# Patient Record
Sex: Female | Born: 1935 | Race: White | Hispanic: No | Smoking: Former smoker
Health system: Southern US, Community
[De-identification: ages and names within clinical notes are randomized; demographics above are authoritative.]

## PROBLEM LIST (undated history)

## (undated) DIAGNOSIS — I2510 Atherosclerotic heart disease of native coronary artery without angina pectoris: Secondary | ICD-10-CM

## (undated) DIAGNOSIS — M1990 Unspecified osteoarthritis, unspecified site: Secondary | ICD-10-CM

## (undated) DIAGNOSIS — Z8701 Personal history of pneumonia (recurrent): Secondary | ICD-10-CM

## (undated) DIAGNOSIS — T8859XA Other complications of anesthesia, initial encounter: Secondary | ICD-10-CM

## (undated) DIAGNOSIS — J449 Chronic obstructive pulmonary disease, unspecified: Secondary | ICD-10-CM

## (undated) DIAGNOSIS — Q211 Atrial septal defect: Secondary | ICD-10-CM

## (undated) DIAGNOSIS — Z8673 Personal history of transient ischemic attack (TIA), and cerebral infarction without residual deficits: Secondary | ICD-10-CM

## (undated) DIAGNOSIS — Z9289 Personal history of other medical treatment: Secondary | ICD-10-CM

## (undated) DIAGNOSIS — R112 Nausea with vomiting, unspecified: Secondary | ICD-10-CM

## (undated) DIAGNOSIS — T4145XA Adverse effect of unspecified anesthetic, initial encounter: Secondary | ICD-10-CM

## (undated) DIAGNOSIS — I82402 Acute embolism and thrombosis of unspecified deep veins of left lower extremity: Secondary | ICD-10-CM

## (undated) DIAGNOSIS — F32A Depression, unspecified: Secondary | ICD-10-CM

## (undated) DIAGNOSIS — I10 Essential (primary) hypertension: Secondary | ICD-10-CM

## (undated) DIAGNOSIS — Z9889 Other specified postprocedural states: Secondary | ICD-10-CM

## (undated) DIAGNOSIS — R609 Edema, unspecified: Secondary | ICD-10-CM

## (undated) DIAGNOSIS — F329 Major depressive disorder, single episode, unspecified: Secondary | ICD-10-CM

## (undated) DIAGNOSIS — M7121 Synovial cyst of popliteal space [Baker], right knee: Secondary | ICD-10-CM

## (undated) DIAGNOSIS — R4701 Aphasia: Secondary | ICD-10-CM

## (undated) DIAGNOSIS — Q2112 Patent foramen ovale: Secondary | ICD-10-CM

## (undated) HISTORY — PX: CORONARY ANGIOPLASTY: SHX604

## (undated) HISTORY — DX: Atrial septal defect: Q21.1

## (undated) HISTORY — DX: Personal history of other medical treatment: Z92.89

## (undated) HISTORY — DX: Synovial cyst of popliteal space (Baker), right knee: M71.21

## (undated) HISTORY — DX: Patent foramen ovale: Q21.12

## (undated) HISTORY — DX: Essential (primary) hypertension: I10

## (undated) HISTORY — DX: Personal history of pneumonia (recurrent): Z87.01

## (undated) HISTORY — DX: Acute embolism and thrombosis of unspecified deep veins of left lower extremity: I82.402

## (undated) HISTORY — DX: Personal history of transient ischemic attack (TIA), and cerebral infarction without residual deficits: Z86.73

---

## 2001-01-17 ENCOUNTER — Other Ambulatory Visit: Admission: RE | Admit: 2001-01-17 | Discharge: 2001-01-17 | Payer: Self-pay | Admitting: Family Medicine

## 2004-11-30 ENCOUNTER — Ambulatory Visit: Payer: Self-pay

## 2005-03-08 ENCOUNTER — Ambulatory Visit: Payer: Self-pay | Admitting: Family Medicine

## 2006-03-14 ENCOUNTER — Ambulatory Visit: Payer: Self-pay | Admitting: Family Medicine

## 2007-03-20 ENCOUNTER — Ambulatory Visit: Payer: Self-pay | Admitting: Family Medicine

## 2007-05-02 ENCOUNTER — Ambulatory Visit: Payer: Self-pay | Admitting: Family Medicine

## 2008-02-20 ENCOUNTER — Ambulatory Visit: Payer: Self-pay | Admitting: Family Medicine

## 2008-03-20 ENCOUNTER — Ambulatory Visit: Payer: Self-pay | Admitting: Family Medicine

## 2009-04-22 ENCOUNTER — Ambulatory Visit: Payer: Self-pay | Admitting: Family Medicine

## 2009-05-17 HISTORY — PX: BALLOON ANGIOPLASTY, ARTERY: SHX564

## 2010-05-13 ENCOUNTER — Ambulatory Visit: Payer: Self-pay | Admitting: Family Medicine

## 2011-03-28 ENCOUNTER — Ambulatory Visit: Payer: Self-pay | Admitting: Family Medicine

## 2011-06-16 ENCOUNTER — Ambulatory Visit: Payer: Self-pay | Admitting: Family Medicine

## 2011-08-09 DIAGNOSIS — I2510 Atherosclerotic heart disease of native coronary artery without angina pectoris: Secondary | ICD-10-CM

## 2011-08-09 HISTORY — DX: Atherosclerotic heart disease of native coronary artery without angina pectoris: I25.10

## 2012-07-03 ENCOUNTER — Ambulatory Visit: Payer: Self-pay | Admitting: Neurology

## 2012-08-02 ENCOUNTER — Ambulatory Visit: Payer: Self-pay | Admitting: Family Medicine

## 2013-11-14 DIAGNOSIS — K922 Gastrointestinal hemorrhage, unspecified: Secondary | ICD-10-CM | POA: Insufficient documentation

## 2013-11-14 DIAGNOSIS — K279 Peptic ulcer, site unspecified, unspecified as acute or chronic, without hemorrhage or perforation: Secondary | ICD-10-CM | POA: Insufficient documentation

## 2013-11-14 DIAGNOSIS — Z9109 Other allergy status, other than to drugs and biological substances: Secondary | ICD-10-CM | POA: Insufficient documentation

## 2013-11-14 HISTORY — DX: Gastrointestinal hemorrhage, unspecified: K92.2

## 2014-01-20 ENCOUNTER — Ambulatory Visit: Payer: Self-pay | Admitting: Family Medicine

## 2014-03-20 DIAGNOSIS — R413 Other amnesia: Secondary | ICD-10-CM | POA: Insufficient documentation

## 2014-03-20 DIAGNOSIS — G479 Sleep disorder, unspecified: Secondary | ICD-10-CM | POA: Insufficient documentation

## 2015-01-14 ENCOUNTER — Other Ambulatory Visit: Payer: Self-pay | Admitting: Family Medicine

## 2015-01-14 NOTE — Telephone Encounter (Signed)
Pt contacted office for refill request on the following medications:  Clonazepam .5 mg  Symbicort 160-4.5  Paroxetine HCI 10 mg Ventolin HFA 108 Pt stated she needs these sent to [facility] order and that she hasn't gotten an order from [facility] since January. I advised that she still has refills on some of the medications at [facility] but since she hasn't been getting the medications she would like [place] to send in new RX or contact them. The refills for Symbicort were sent to [facility] but she would like to get that from [facility] instead. Thanks TNP

## 2015-01-16 NOTE — Telephone Encounter (Signed)
Advise patient Stephanie [name] has refills available for Paroxetine, Ventolin and Symbicort waiting for her call to request shipment to her home. The Clonazepam was refilled on 11-17-14 for 90 days and she should still have a month supply at home. This is a scheduled drug that will need to be written so she can pick up the prescription for refill (not to be electronically prescribed). Can get it ready for pick up so she can mail it to the pharmacy around the end of June.

## 2015-01-19 NOTE — Telephone Encounter (Signed)
Patient advised as directed below. Patient verbalized understanding.  

## 2015-02-10 ENCOUNTER — Telehealth: Payer: Self-pay | Admitting: Family Medicine

## 2015-02-10 ENCOUNTER — Other Ambulatory Visit: Payer: Self-pay | Admitting: Family Medicine

## 2015-02-10 DIAGNOSIS — F418 Other specified anxiety disorders: Secondary | ICD-10-CM

## 2015-02-10 NOTE — Telephone Encounter (Signed)
Pt would like refill of clonazepam 5 mg sent to her mail order pharmacy (pt thinks it is [facility])

## 2015-02-10 NOTE — Telephone Encounter (Signed)
Pt would like for clozapine 5 mg sent into her mail order pharmacy (pt thinks it is [facility])

## 2015-02-12 NOTE — Telephone Encounter (Signed)
Patient states her insurance changed mail order pharmacies. Patient states she uses [facility] [employer].

## 2015-02-12 NOTE — Telephone Encounter (Signed)
Ask patient to check with insurance about the proper mailorder pharmacy because the last refill we sent in was to [facility] [employer].

## 2015-02-13 MED ORDER — CLONAZEPAM 0.5 MG PO TABS: 0.5000 mg | ORAL_TABLET | Freq: Two times a day (BID) | ORAL | Status: DC | PRN

## 2015-02-13 NOTE — Telephone Encounter (Signed)
Advise patient the Epic system will not send this medication electronically - must be printed out and patient mail it in.

## 2015-02-13 NOTE — Telephone Encounter (Signed)
Patient advised as directed below. Patient states she will pick up RX today.

## 2015-03-04 DIAGNOSIS — R0681 Apnea, not elsewhere classified: Secondary | ICD-10-CM | POA: Insufficient documentation

## 2015-03-04 DIAGNOSIS — E785 Hyperlipidemia, unspecified: Secondary | ICD-10-CM | POA: Insufficient documentation

## 2015-03-04 DIAGNOSIS — M8580 Other specified disorders of bone density and structure, unspecified site: Secondary | ICD-10-CM | POA: Insufficient documentation

## 2015-03-04 DIAGNOSIS — J449 Chronic obstructive pulmonary disease, unspecified: Secondary | ICD-10-CM | POA: Insufficient documentation

## 2015-03-04 DIAGNOSIS — K279 Peptic ulcer, site unspecified, unspecified as acute or chronic, without hemorrhage or perforation: Secondary | ICD-10-CM | POA: Insufficient documentation

## 2015-03-04 DIAGNOSIS — I2510 Atherosclerotic heart disease of native coronary artery without angina pectoris: Secondary | ICD-10-CM | POA: Insufficient documentation

## 2015-03-04 DIAGNOSIS — F419 Anxiety disorder, unspecified: Secondary | ICD-10-CM

## 2015-03-04 DIAGNOSIS — F329 Major depressive disorder, single episode, unspecified: Secondary | ICD-10-CM | POA: Insufficient documentation

## 2015-03-05 ENCOUNTER — Ambulatory Visit (INDEPENDENT_AMBULATORY_CARE_PROVIDER_SITE_OTHER): Payer: Commercial Managed Care - HMO | Admitting: Family Medicine

## 2015-03-05 ENCOUNTER — Encounter: Payer: Self-pay | Admitting: Family Medicine

## 2015-03-05 VITALS — BP 126/62 | HR 68 | Temp 97.7°F | Resp 16 | Wt 129.4 lb

## 2015-03-05 DIAGNOSIS — F418 Other specified anxiety disorders: Secondary | ICD-10-CM

## 2015-03-05 DIAGNOSIS — L299 Pruritus, unspecified: Secondary | ICD-10-CM

## 2015-03-05 DIAGNOSIS — J449 Chronic obstructive pulmonary disease, unspecified: Secondary | ICD-10-CM | POA: Diagnosis not present

## 2015-03-05 DIAGNOSIS — F329 Major depressive disorder, single episode, unspecified: Secondary | ICD-10-CM

## 2015-03-05 DIAGNOSIS — F419 Anxiety disorder, unspecified: Secondary | ICD-10-CM

## 2015-03-05 NOTE — Progress Notes (Signed)
Patient ID: Stephanie [name], female   DOB: Nov 08, 1935, 79 y.o.   MRN: [id number]   Patient: Stephanie [name] Female    DOB: 22-Aug-1935   79 y.o.   MRN: [id number] Visit Date: 03/05/2015  Today's Provider: Vernie Murders, PA   Chief Complaint  Patient presents with  . Pruritis    episodic X 2 months   Subjective:    HPI  This 79 year old female developed itching in different areas with recent increase in stress with family and neighbor. Having spontaneous crying spells, anxiety and feeling bloated. COPD worse with dyspnea since the weather has been very hot. Still using Albuterol and Symbicort. Had a bad flare with chasing a roach around her house. Thinks itching is due to stress and anxiety.      Patient Active Problem List   Diagnosis Date Noted  . Anxiety and depression 03/04/2015  . Arteriosclerosis of coronary artery 03/04/2015  . CAFL (chronic airflow limitation) 03/04/2015  . Breathlessness on exertion 03/04/2015  . HLD (hyperlipidemia) 03/04/2015  . Osteopenia 03/04/2015  . Peptic ulcer 03/04/2015  . Amnesia 03/20/2014  . Disordered sleep 03/20/2014  . Allergy to environmental factors 11/14/2013  . Gastroduodenal ulcer 11/14/2013   History  Substance Use Topics  . Smoking status: Former Research scientist (life sciences)  . Smokeless tobacco: Not on file     Comment: QUIT IN 2002  . Alcohol Use: No   Family History  Problem Relation Age of Onset  . Hypertension Mother   . Diabetes Mother   . Colon cancer Mother    Past Surgical History  Procedure Laterality Date  . Balloon angioplasty, artery  05/17/2009   Allergies  Allergen Reactions  . Duloxetine Hcl   . Influenza Vaccines   . Venlafaxine     GI distress    Previous Medications   ACETAMINOPHEN (TYLENOL) 500 MG TABLET    Take 1 tablet by mouth daily.   ALBUTEROL (VENTOLIN HFA) 108 (90 BASE) MCG/ACT INHALER    Inhale 2 puffs into the lungs every 6 (six) hours as needed.   ASPIRIN 325 MG EC TABLET    Take 1 tablet by  mouth daily.   BUDESONIDE-FORMOTEROL (SYMBICORT) 160-4.5 MCG/ACT INHALER    Inhale 2 puffs into the lungs 2 (two) times daily.   CHOLECALCIFEROL 1000 UNITS CAPSULE    Take 1 capsule by mouth daily.   CLONAZEPAM (KLONOPIN) 0.5 MG TABLET    Take 1 tablet (0.5 mg total) by mouth 2 (two) times daily as needed for anxiety.   IBUPROFEN (ADVIL,MOTRIN) 200 MG TABLET    Take 2 tablets by mouth daily.   PAROXETINE (PAXIL) 10 MG TABLET    Take 0.5-1 tablets by mouth daily.   PRAVASTATIN (PRAVACHOL) 20 MG TABLET    Take 1 tablet by mouth daily.   TIOTROPIUM (SPIRIVA HANDIHALER) 18 MCG INHALATION CAPSULE    Place 1 capsule into inhaler and inhale daily.   Review of Systems  Constitutional: Negative.   HENT: Negative.   Respiratory: Positive for shortness of breath.        Worse with exertion.  Cardiovascular: Negative.   Skin:       Itching without rash today  Psychiatric/Behavioral: Positive for dysphoric mood. The patient is nervous/anxious.   All other systems reviewed and are negative.    Objective:   BP 126/62 mmHg  Pulse 68  Temp(Src) 97.7 F (36.5 C) (Oral)  Resp 16  Wt 129 lb 6.4 oz (58.695 kg)  Physical Exam  Constitutional:  She is oriented to person, place, and time. She appears well-developed and well-nourished. No distress.  HENT:  Head: Normocephalic and atraumatic.  Right Ear: Hearing normal.  Left Ear: Hearing normal.  Nose: Nose normal.  Eyes: Conjunctivae and lids are normal. Right eye exhibits no discharge. Left eye exhibits no discharge. No scleral icterus.  Cardiovascular: Normal rate and regular rhythm.   Pulmonary/Chest: Effort normal. No respiratory distress.  Distant breath sounds.  Abdominal: Soft. Bowel sounds are normal.  Musculoskeletal: Normal range of motion.  Neurological: She is alert and oriented to person, place, and time.  Skin: Skin is warm, dry and intact. No lesion and no rash noted.  Psychiatric: Her speech is normal and behavior is normal.  Thought content normal. Her mood appears anxious. She exhibits a depressed mood.      Assessment & Plan:     1. Itching Onset over the past 2 months without rash. 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 May use Claritin or Zyrtec prn and proceed with usual dosages of Clonazepam and Paroxetine. Recheck if no better in a month.   2. Anxiety and depression Flare with increase in family and neighbor stress. Continue present medications and follow up as needed. May need referral to psychiatrist if not returning to baseline.  3. COPD, moderate Stable breathing on the Albuterol inhaler, Symbicort and Spiriva. Dyspnea still a problem with physical exertion. Continue presetn regimen and follow up for CPE in the fall.

## 2015-04-17 ENCOUNTER — Ambulatory Visit (INDEPENDENT_AMBULATORY_CARE_PROVIDER_SITE_OTHER): Payer: Commercial Managed Care - HMO | Admitting: Family Medicine

## 2015-04-17 ENCOUNTER — Encounter: Payer: Self-pay | Admitting: Family Medicine

## 2015-04-17 VITALS — BP 142/74 | HR 89 | Temp 97.5°F | Resp 16 | Wt 128.4 lb

## 2015-04-17 DIAGNOSIS — F418 Other specified anxiety disorders: Secondary | ICD-10-CM

## 2015-04-17 DIAGNOSIS — L509 Urticaria, unspecified: Secondary | ICD-10-CM

## 2015-04-17 DIAGNOSIS — F329 Major depressive disorder, single episode, unspecified: Secondary | ICD-10-CM

## 2015-04-17 DIAGNOSIS — F419 Anxiety disorder, unspecified: Secondary | ICD-10-CM

## 2015-04-17 NOTE — Progress Notes (Signed)
Patient ID: Stephanie [name], female   DOB: 09-Jun-1936, 79 y.o.   MRN: [id number]   Patient: Stephanie [name] Female    DOB: 11-06-1935   79 y.o.   MRN: [id number] Visit Date: 04/17/2015  Today's Provider: Vernie Murders, PA   Chief Complaint  Patient presents with  . Rash    X 2 weeks   Subjective:    Rash This is a new problem. The current episode started 1 to 4 weeks ago. The problem has been gradually worsening since onset. The affected locations include the chest, right arm and left arm. The rash is characterized by itchiness and redness. She was exposed to nothing. Past treatments include nothing.  Gold Bond cream and Neutrogena Soap some help but no real help from Claritin. Changed detergents to unscented to try to stop itching. No new foods or changes in medications. Patient Active Problem List   Diagnosis Date Noted  . COPD, moderate 03/05/2015  . Anxiety and depression 03/04/2015  . Arteriosclerosis of coronary artery 03/04/2015  . CAFL (chronic airflow limitation) 03/04/2015  . Breathlessness on exertion 03/04/2015  . HLD (hyperlipidemia) 03/04/2015  . Osteopenia 03/04/2015  . Peptic ulcer 03/04/2015  . Amnesia 03/20/2014  . Disordered sleep 03/20/2014  . Allergy to environmental factors 11/14/2013  . Gastroduodenal ulcer 11/14/2013   Allergies  Allergen Reactions  . Duloxetine Hcl   . Influenza Vaccines   . Venlafaxine     GI distress     Previous Medications   ACETAMINOPHEN (TYLENOL) 500 MG TABLET    Take 1 tablet by mouth daily.   ALBUTEROL (VENTOLIN HFA) 108 (90 BASE) MCG/ACT INHALER    Inhale 2 puffs into the lungs every 6 (six) hours as needed.   ASPIRIN 325 MG EC TABLET    Take 1 tablet by mouth daily.   BUDESONIDE-FORMOTEROL (SYMBICORT) 160-4.5 MCG/ACT INHALER    Inhale 2 puffs into the lungs 2 (two) times daily.   CHOLECALCIFEROL 1000 UNITS CAPSULE    Take 1 capsule by mouth daily.   CLONAZEPAM (KLONOPIN) 0.5 MG TABLET    Take 1 tablet (0.5 mg  total) by mouth 2 (two) times daily as needed for anxiety.   IBUPROFEN (ADVIL,MOTRIN) 200 MG TABLET    Take 2 tablets by mouth daily.   LORATADINE 10 MG CAPS    Take by mouth.   PAROXETINE (PAXIL) 10 MG TABLET    Take 0.5-1 tablets by mouth daily.   PRAVASTATIN (PRAVACHOL) 20 MG TABLET    Take 1 tablet by mouth daily.   TIOTROPIUM (SPIRIVA HANDIHALER) 18 MCG INHALATION CAPSULE    Place 1 capsule into inhaler and inhale daily.    Review of Systems  Skin: Positive for rash.    Social History  Substance Use Topics  . Smoking status: Former Research scientist (life sciences)  . Smokeless tobacco: Not on file     Comment: QUIT IN 2002  . Alcohol Use: No   Objective:   BP 142/74 mmHg  Pulse 89  Temp(Src) 97.5 F (36.4 C) (Oral)  Resp 16  Wt 128 lb 6.4 oz (58.242 kg)  SpO2 96%  Physical Exam  Constitutional: She is oriented to person, place, and time. She appears well-developed and well-nourished.  Cardiovascular: Normal rate and regular rhythm.   Pulmonary/Chest: She has no wheezes. She has no rales.  Distant breath sounds unchanged.  Neurological: She is alert and oriented to person, place, and time.  Skin: No rash noted.  Intermittent pink whelps upper inner arms and across  chest. No pain or vesicles.  Psychiatric: Her behavior is normal. Thought content normal. Her mood appears anxious.      Assessment & Plan:     1. Urticaria Persistent recurrent itching whelps. No changes to breathing. Recommend using Baby Oil rubdown after shower to help maintain moisture in skin. May continue Claritin and add Zantac. Recheck prn.  2. Anxiety and depression Stress less now. Still using Clonazepam at night and Paxil each morning. To control anxiety better (and hives), may increase Paxil to 10 mg  2 daily for a couple weeks. Recheck for CPE in a month.

## 2015-04-17 NOTE — Patient Instructions (Signed)
Hives Hives are itchy, red, swollen areas of the skin. They can vary in size and location on your body. Hives can come and go for hours or several days (acute hives) or for several weeks (chronic hives). Hives do not spread from person to person (noncontagious). They may get worse with scratching, exercise, and emotional stress. CAUSES   Allergic reaction to food, additives, or drugs.  Infections, including the common cold.  Illness, such as vasculitis, lupus, or thyroid disease.  Exposure to sunlight, heat, or cold.  Exercise.  Stress.  Contact with chemicals. SYMPTOMS   Red or white swollen patches on the skin. The patches may change size, shape, and location quickly and repeatedly.  Itching.  Swelling of the hands, feet, and face. This may occur if hives develop deeper in the skin. DIAGNOSIS  Your caregiver can usually tell what is wrong by performing a physical exam. Skin or blood tests may also be done to determine the cause of your hives. In some cases, the cause cannot be determined. TREATMENT  Mild cases usually get better with medicines such as antihistamines. Severe cases may require an emergency epinephrine injection. If the cause of your hives is known, treatment includes avoiding that trigger.  HOME CARE INSTRUCTIONS   Avoid causes that trigger your hives.  Take antihistamines as directed by your caregiver to reduce the severity of your hives. Non-sedating or low-sedating antihistamines are usually recommended. Do not drive while taking an antihistamine.  Take any other medicines prescribed for itching as directed by your caregiver.  Wear loose-fitting clothing.  Keep all follow-up appointments as directed by your caregiver. SEEK MEDICAL CARE IF:   You have persistent or severe itching that is not relieved with medicine.  You have painful or swollen joints. SEEK IMMEDIATE MEDICAL CARE IF:   You have a fever.  Your tongue or lips are swollen.  You have  trouble breathing or swallowing.  You feel tightness in the throat or chest.  You have abdominal pain. These problems may be the first sign of a life-threatening allergic reaction. Call your local emergency services (911 in [place] MAKE SURE YOU:   Understand these instructions.  Will watch your condition.  Will get help right away if you are not doing well or get worse. Document Released: 07/25/2005 Document Revised: 07/30/2013 Document Reviewed: 10/18/2011 ExitCare Patient Information 2015 [employer]. This information is not intended to replace advice given to you by your health care provider. Make sure you discuss any questions you have with your health care provider.  

## 2015-05-14 ENCOUNTER — Other Ambulatory Visit: Payer: Self-pay | Admitting: Family Medicine

## 2015-05-14 DIAGNOSIS — F418 Other specified anxiety disorders: Secondary | ICD-10-CM

## 2015-05-14 MED ORDER — CLONAZEPAM 0.5 MG PO TABS: 0.5000 mg | ORAL_TABLET | Freq: Two times a day (BID) | ORAL | Status: DC | PRN

## 2015-05-14 NOTE — Telephone Encounter (Signed)
RX called in at [facility] mail order pharmacy. Patient is aware.

## 2015-05-14 NOTE — Telephone Encounter (Signed)
Pt contacted office for refill request on the following medications: clonazePAM (KLONOPIN) 0.5 MG.  JI#[phone]/BP  Pt is requesting this sent [employer] mail order if possible/MW

## 2015-06-02 ENCOUNTER — Other Ambulatory Visit: Payer: Self-pay

## 2015-06-02 MED ORDER — PAROXETINE HCL 10 MG PO TABS: ORAL_TABLET | ORAL | Status: DC

## 2015-06-02 NOTE — Telephone Encounter (Signed)
Pt called requesting medication refill-Paroxetine. Please review-aa

## 2015-06-29 ENCOUNTER — Telehealth: Payer: Self-pay | Admitting: Family Medicine

## 2015-06-29 ENCOUNTER — Ambulatory Visit (INDEPENDENT_AMBULATORY_CARE_PROVIDER_SITE_OTHER): Payer: Commercial Managed Care - HMO | Admitting: Family Medicine

## 2015-06-29 ENCOUNTER — Encounter: Payer: Self-pay | Admitting: Family Medicine

## 2015-06-29 VITALS — BP 142/84 | HR 73 | Temp 97.6°F | Resp 18 | Ht 60.5 in | Wt 126.6 lb

## 2015-06-29 DIAGNOSIS — Z0000 Encounter for general adult medical examination without abnormal findings: Secondary | ICD-10-CM | POA: Diagnosis not present

## 2015-06-29 NOTE — Progress Notes (Signed)
Patient ID: Stephanie [name], female   DOB: [birth year], 79 y.o.   MRN: [id number] Patient: Stephanie [name], Female    DOB: 02/01/1936, 79 y.o.   MRN: [id number] Visit Date: 06/29/2015  Today's Provider: Vernie Murders, PA   Chief Complaint  Patient presents with  . Annual Exam   Subjective:  Analee Montee is a 79 y.o. female who presents today for health maintenance and complete physical. She feels well. She reports exercising sometimes- walking and climbing steps. She reports she is sleeping well (average 7-8 hours a night).   Review of Systems  Constitutional: Negative.   HENT: Negative.   Eyes: Positive for redness.  Respiratory: Positive for shortness of breath and wheezing.   Cardiovascular: Positive for leg swelling.  Endocrine: Negative.   Genitourinary: Negative.   Musculoskeletal: Negative.   Skin: Positive for rash.  Allergic/Immunologic: Negative.   Neurological: Positive for light-headedness.       Unsure of trigger but has some weakness and vomited. Denies diarrhea or stomach cramps.  Hematological: Negative.   Psychiatric/Behavioral: Negative.     Social History   Social History  . Marital Status: Married    Spouse Name: N/A  . Number of Children: N/A  . Years of Education: N/A   Occupational History  . Not on file.   Social History Main Topics  . Smoking status: Former Research scientist (life sciences)  . Smokeless tobacco: Not on file     Comment: QUIT IN 2002  . Alcohol Use: No  . Drug Use: No  . Sexual Activity: Not on file   Other Topics Concern  . Not on file   Social History Narrative    Patient Active Problem List   Diagnosis Date Noted  . COPD, moderate ([place]) 03/05/2015  . Anxiety and depression 03/04/2015  . Arteriosclerosis of coronary artery 03/04/2015  . CAFL (chronic airflow limitation) ([place]) 03/04/2015  . Breathlessness on exertion 03/04/2015  . HLD (hyperlipidemia) 03/04/2015  . Osteopenia 03/04/2015  . Peptic ulcer 03/04/2015  .  Amnesia 03/20/2014  . Disordered sleep 03/20/2014  . Allergy to environmental factors 11/14/2013  . Gastroduodenal ulcer 11/14/2013    Past Surgical History  Procedure Laterality Date  . Balloon angioplasty, artery  05/17/2009    Her family history includes Colon cancer in her mother; Diabetes in her mother; Hypertension in her mother.    Outpatient Prescriptions Prior to Visit  Medication Sig Dispense Refill  . acetaminophen (TYLENOL) 500 MG tablet Take 1 tablet by mouth daily.    Marland Kitchen albuterol (VENTOLIN HFA) 108 (90 BASE) MCG/ACT inhaler Inhale 2 puffs into the lungs every 6 (six) hours as needed.    Marland Kitchen aspirin 325 MG EC tablet Take 1 tablet by mouth daily.    . Cholecalciferol 1000 UNITS capsule Take 1 capsule by mouth daily.    . clonazePAM (KLONOPIN) 0.5 MG tablet Take 1 tablet (0.5 mg total) by mouth 2 (two) times daily as needed for anxiety. 180 tablet 0  . ibuprofen (ADVIL,MOTRIN) 200 MG tablet Take 2 tablets by mouth daily.    . Loratadine 10 MG CAPS Take by mouth.    Marland Kitchen PARoxetine (PAXIL) 10 MG tablet 2 tablets daily by mouth at bedtime 60 tablet 3  . pravastatin (PRAVACHOL) 20 MG tablet Take 1 tablet by mouth daily.    . SYMBICORT 160-4.5 MCG/ACT inhaler INHALE 2 PUFFS TWICE DAILY  3 Inhaler 3  . tiotropium (SPIRIVA HANDIHALER) 18 MCG inhalation capsule Place 1 capsule into inhaler and inhale daily.  No facility-administered medications prior to visit.   Allergies  Allergen Reactions  . Duloxetine Hcl   . Influenza Vaccines   . Venlafaxine     GI distress    Patient Care Team: Margo Common, PA as PCP - General (Physician Assistant)     Objective:   Vitals:  Filed Vitals:   06/29/15 1356  BP: 142/84  Pulse: 73  Temp: 97.6 F (36.4 C)  TempSrc: Oral  Resp: 18  Height: 5' 0.5" (1.537 m)  Weight: 126 lb 9.6 oz (57.425 kg)  SpO2: 96%    Physical Exam  Constitutional: She is oriented to person, place, and time. She appears well-developed.  HENT:   Head: Normocephalic and atraumatic.  Left Ear: External ear normal.  Nose: Nose normal.  Mouth/Throat: Oropharynx is clear and moist.  Large amount of wax in right ear canal.  Eyes: Conjunctivae and EOM are normal.  Injected vessels in the left lateral conjunctiva.  Neck: Normal range of motion. Neck supple.  Cardiovascular: Normal rate, regular rhythm, normal heart sounds and intact distal pulses.   Pulmonary/Chest: Effort normal and breath sounds normal.  Abdominal: Soft. Bowel sounds are normal.  Musculoskeletal:  Stiff and prominent first carpal joint of each thumb.  Lymphadenopathy:    She has no cervical adenopathy.  Neurological: She is alert and oriented to person, place, and time. She has normal reflexes.  Skin: No rash noted.  Psychiatric: She has a normal mood and affect. Her behavior is normal. Judgment and thought content normal.   Depression Screen PHQ 2/9 Scores 06/29/2015  PHQ - 2 Score 0   Assessment & Plan:     Routine Health Maintenance and Physical Exam  Exercise Activities and Dietary recommendations Goals    None      Immunization History  Administered Date(s) Administered  . Pneumococcal Conjugate-13 06/26/2014  . Pneumococcal Polysaccharide-23 07/19/2006, 02/13/2008  . Zoster 02/13/2008    Health Maintenance  Topic Date Due  . TETANUS/TDAP  01/15/1957  . DEXA SCAN  01/16/2003  . INFLUENZA VACCINE  03/09/2015  . ZOSTAVAX  Completed  . PNA vac Low Risk Adult  Completed      Discussed health benefits of physical activity, and encouraged her to engage in regular exercise appropriate for her age and condition.    ------------------------------------------------------------------------------------------------------------ 1. Medicare annual wellness visit, initial Feeling well. No falls, smoking or alcohol use. Anxiety with depressive reaction well controlled with Clonazepam and Paroxetine. Will recheck cholesterol, CAD and COPD with labs and  spirometry in January 2017. Has had an allergic reaction from flu shots.  - POCT urinalysis dipstick

## 2015-06-29 NOTE — Telephone Encounter (Signed)
Please advise 

## 2015-06-29 NOTE — Telephone Encounter (Signed)
Would like her to try taking the Paroxetine 10 mg one each morning and the Clonazepam 0.5 mg one at bedtime. Recheck lungs and labs in January 2017.

## 2015-06-29 NOTE — Telephone Encounter (Signed)
Pt would like a call back b/c she couldn't remember if Stephanie [name] wanted to her to change the dose on her PARoxetine (PAXIL) 10 MG tablet & clonazePAM (KLONOPIN) 0.5 MG tablet. Please advise. Thanks TNP

## 2015-06-29 NOTE — Patient Instructions (Signed)
Health Maintenance, Female Adopting a healthy lifestyle and getting preventive care can go a long way to promote health and wellness. Talk with your health care provider about what schedule of regular examinations is right for you. This is a good chance for you to check in with your provider about disease prevention and staying healthy. In between checkups, there are plenty of things you can do on your own. Experts have done a lot of research about which lifestyle changes and preventive measures are most likely to keep you healthy. Ask your health care provider for more information. WEIGHT AND DIET  Eat a healthy diet  Be sure to include plenty of vegetables, fruits, low-fat dairy products, and lean protein.  Do not eat a lot of foods high in solid fats, added sugars, or salt.  Get regular exercise. This is one of the most important things you can do for your health.  Most adults should exercise for at least 150 minutes each week. The exercise should increase your heart rate and make you sweat (moderate-intensity exercise).  Most adults should also do strengthening exercises at least twice a week. This is in addition to the moderate-intensity exercise.  Maintain a healthy weight  Body mass index (BMI) is a measurement that can be used to identify possible weight problems. It estimates body fat based on height and weight. Your health care provider can help determine your BMI and help you achieve or maintain a healthy weight.  For females 20 years of age and older:   A BMI below 18.5 is considered underweight.  A BMI of 18.5 to 24.9 is normal.  A BMI of 25 to 29.9 is considered overweight.  A BMI of 30 and above is considered obese.  Watch levels of cholesterol and blood lipids  You should start having your blood tested for lipids and cholesterol at 77 years of age, then have this test every 5 years.  You may need to have your cholesterol levels checked more often if:  Your lipid  or cholesterol levels are high.  You are older than 77 years of age.  You are at high risk for heart disease.  CANCER SCREENING   Lung Cancer  Lung cancer screening is recommended for adults 55-80 years old who are at high risk for lung cancer because of a history of smoking.  A yearly low-dose CT scan of the lungs is recommended for people who:  Currently smoke.  Have quit within the past 15 years.  Have at least a 30-pack-year history of smoking. A pack year is smoking an average of one pack of cigarettes a day for 1 year.  Yearly screening should continue until it has been 15 years since you quit.  Yearly screening should stop if you develop a health problem that would prevent you from having lung cancer treatment.  Breast Cancer  Practice breast self-awareness. This means understanding how your breasts normally appear and feel.  It also means doing regular breast self-exams. Let your health care provider know about any changes, no matter how small.  If you are in your 20s or 30s, you should have a clinical breast exam (CBE) by a health care provider every 1-3 years as part of a regular health exam.  If you are 40 or older, have a CBE every year. Also consider having a breast X-ray (mammogram) every year.  If you have a family history of breast cancer, talk to your health care provider about genetic screening.  If you   are at high risk for breast cancer, talk to your health care provider about having an MRI and a mammogram every year.  Breast cancer gene (BRCA) assessment is recommended for women who have family members with BRCA-related cancers. BRCA-related cancers include:  Breast.  Ovarian.  Tubal.  Peritoneal cancers.  Results of the assessment will determine the need for genetic counseling and BRCA1 and BRCA2 testing. Cervical Cancer Your health care provider may recommend that you be screened regularly for cancer of the pelvic organs (ovaries, uterus, and  vagina). This screening involves a pelvic examination, including checking for microscopic changes to the surface of your cervix (Pap test). You may be encouraged to have this screening done every 3 years, beginning at age 21.  For women ages 30-65, health care providers may recommend pelvic exams and Pap testing every 3 years, or they may recommend the Pap and pelvic exam, combined with testing for human papilloma virus (HPV), every 5 years. Some types of HPV increase your risk of cervical cancer. Testing for HPV may also be done on women of any age with unclear Pap test results.  Other health care providers may not recommend any screening for nonpregnant women who are considered low risk for pelvic cancer and who do not have symptoms. Ask your health care provider if a screening pelvic exam is right for you.  If you have had past treatment for cervical cancer or a condition that could lead to cancer, you need Pap tests and screening for cancer for at least 20 years after your treatment. If Pap tests have been discontinued, your risk factors (such as having a new sexual partner) need to be reassessed to determine if screening should resume. Some women have medical problems that increase the chance of getting cervical cancer. In these cases, your health care provider may recommend more frequent screening and Pap tests. Colorectal Cancer  This type of cancer can be detected and often prevented.  Routine colorectal cancer screening usually begins at 77 years of age and continues through 77 years of age.  Your health care provider may recommend screening at an earlier age if you have risk factors for colon cancer.  Your health care provider may also recommend using home test kits to check for hidden blood in the stool.  A small camera at the end of a tube can be used to examine your colon directly (sigmoidoscopy or colonoscopy). This is done to check for the earliest forms of colorectal  cancer.  Routine screening usually begins at age 50.  Direct examination of the colon should be repeated every 5-10 years through 77 years of age. However, you may need to be screened more often if early forms of precancerous polyps or small growths are found. Skin Cancer  Check your skin from head to toe regularly.  Tell your health care provider about any new moles or changes in moles, especially if there is a change in a mole's shape or color.  Also tell your health care provider if you have a mole that is larger than the size of a pencil eraser.  Always use sunscreen. Apply sunscreen liberally and repeatedly throughout the day.  Protect yourself by wearing long sleeves, pants, a wide-brimmed hat, and sunglasses whenever you are outside. HEART DISEASE, DIABETES, AND HIGH BLOOD PRESSURE   High blood pressure causes heart disease and increases the risk of stroke. High blood pressure is more likely to develop in:  People who have blood pressure in the high end   of the normal range (130-139/85-89 mm Hg).  People who are overweight or obese.  People who are African American.  If you are 38-23 years of age, have your blood pressure checked every 3-5 years. If you are 61 years of age or older, have your blood pressure checked every year. You should have your blood pressure measured twice--once when you are at a hospital or clinic, and once when you are not at a hospital or clinic. Record the average of the two measurements. To check your blood pressure when you are not at a hospital or clinic, you can use:  An automated blood pressure machine at a pharmacy.  A home blood pressure monitor.  If you are between 45 years and 39 years old, ask your health care provider if you should take aspirin to prevent strokes.  Have regular diabetes screenings. This involves taking a blood sample to check your fasting blood sugar level.  If you are at a normal weight and have a low risk for diabetes,  have this test once every three years after 77 years of age.  If you are overweight and have a high risk for diabetes, consider being tested at a younger age or more often. PREVENTING INFECTION  Hepatitis B  If you have a higher risk for hepatitis B, you should be screened for this virus. You are considered at high risk for hepatitis B if:  You were born in a country where hepatitis B is common. Ask your health care provider which countries are considered high risk.  Your parents were born in a high-risk country, and you have not been immunized against hepatitis B (hepatitis B vaccine).  You have HIV or AIDS.  You use needles to inject street drugs.  You live with someone who has hepatitis B.  You have had sex with someone who has hepatitis B.  You get hemodialysis treatment.  You take certain medicines for conditions, including cancer, organ transplantation, and autoimmune conditions. Hepatitis C  Blood testing is recommended for:  Everyone born from [birth year] through 1963.  Anyone with known risk factors for hepatitis C. Sexually transmitted infections (STIs)  You should be screened for sexually transmitted infections (STIs) including gonorrhea and chlamydia if:  You are sexually active and are younger than 77 years of age.  You are older than 77 years of age and your health care provider tells you that you are at risk for this type of infection.  Your sexual activity has changed since you were last screened and you are at an increased risk for chlamydia or gonorrhea. Ask your health care provider if you are at risk.  If you do not have HIV, but are at risk, it may be recommended that you take a prescription medicine daily to prevent HIV infection. This is called pre-exposure prophylaxis (PrEP). You are considered at risk if:  You are sexually active and do not regularly use condoms or know the HIV status of your partner(s).  You take drugs by injection.  You are sexually  active with a partner who has HIV. Talk with your health care provider about whether you are at high risk of being infected with HIV. If you choose to begin PrEP, you should first be tested for HIV. You should then be tested every 3 months for as long as you are taking PrEP.  PREGNANCY   If you are premenopausal and you may become pregnant, ask your health care provider about preconception counseling.  If you may  become pregnant, take 400 to 800 micrograms (mcg) of folic acid every day.  If you want to prevent pregnancy, talk to your health care provider about birth control (contraception). OSTEOPOROSIS AND MENOPAUSE   Osteoporosis is a disease in which the bones lose minerals and strength with aging. This can result in serious bone fractures. Your risk for osteoporosis can be identified using a bone density scan.  If you are 61 years of age or older, or if you are at risk for osteoporosis and fractures, ask your health care provider if you should be screened.  Ask your health care provider whether you should take a calcium or vitamin D supplement to lower your risk for osteoporosis.  Menopause may have certain physical symptoms and risks.  Hormone replacement therapy may reduce some of these symptoms and risks. Talk to your health care provider about whether hormone replacement therapy is right for you.  HOME CARE INSTRUCTIONS   Schedule regular health, dental, and eye exams.  Stay current with your immunizations.   Do not use any tobacco products including cigarettes, chewing tobacco, or electronic cigarettes.  If you are pregnant, do not drink alcohol.  If you are breastfeeding, limit how much and how often you drink alcohol.  Limit alcohol intake to no more than 1 drink per day for nonpregnant women. One drink equals 12 ounces of beer, 5 ounces of wine, or 1 ounces of hard liquor.  Do not use street drugs.  Do not share needles.  Ask your health care provider for help if  you need support or information about quitting drugs.  Tell your health care provider if you often feel depressed.  Tell your health care provider if you have ever been abused or do not feel safe at home.   This information is not intended to replace advice given to you by your health care provider. Make sure you discuss any questions you have with your health care provider.   Document Released: 02/07/2011 Document Revised: 08/15/2014 Document Reviewed: 06/26/2013 Elsevier Interactive Patient Education [employer].

## 2015-06-29 NOTE — Telephone Encounter (Signed)
Patient advised as directed below. Patient verbalized understanding.  

## 2015-07-01 ENCOUNTER — Telehealth: Payer: Self-pay | Admitting: *Deleted

## 2015-07-01 NOTE — Telephone Encounter (Signed)
Patient called to inform Simona Huh that she took 1 clonazepam and 2 paroxetine at bedtime two nights ago and the next morning she had a hard time functioning. Last night pt took 1 clonazepam and 1 paroxetine and this morning she felt great. Patient also took 1 clonazepam this morning. Patient wanted to let Simona Huh know that for now she was only going to take paroxetine qd.

## 2015-07-04 NOTE — Telephone Encounter (Signed)
Remind patient the plan was to take only one Paroxetine 10 mg each morning and only one Clonazepam 0.5 mg at bedtime.

## 2015-07-06 NOTE — Telephone Encounter (Signed)
Patient advised as directed below. Patient states she does not want to take Paroxetine in the morning. Patient states she is taking Clonazepam in the morning and at bedtime, and the paroxetine at bedtime.   Recommended that patient come in for a OV to discuss medication. Patient declined. Patient states that the way she is taking the medication is working for her and she prefers to keep taking the medication like stated above.   Please advise.

## 2015-08-06 ENCOUNTER — Telehealth: Payer: Self-pay | Admitting: Family Medicine

## 2015-08-06 NOTE — Telephone Encounter (Signed)
Pt states she see Simona Huh.  Pt states she has COPD and has chest congestion and cough the started yesterday.  Pt is requesting a Rx sent to help with this without coming in.  Pt request a note sent to Dr Caryn Section.  [facility].  XY#[phone]/[id number]

## 2015-08-06 NOTE — Telephone Encounter (Signed)
Called patient back. Patient stated that she is coughing every few minutes, chest feels congested. Non-productive cough, no fever. Patient has been taking OTC mucinex, not affective. Patient is requesting rx for cough. Please advise?

## 2015-08-06 NOTE — Telephone Encounter (Signed)
Needs to be seen

## 2015-08-06 NOTE — Telephone Encounter (Signed)
Pt advised.  Pt states she will call back for an appointment/MW

## 2015-08-11 ENCOUNTER — Telehealth: Payer: Self-pay | Admitting: Family Medicine

## 2015-08-11 MED ORDER — AZITHROMYCIN 250 MG PO TABS: ORAL_TABLET | ORAL | Status: DC

## 2015-08-11 NOTE — Telephone Encounter (Signed)
Please review. Thanks!  

## 2015-08-11 NOTE — Telephone Encounter (Signed)
Patient states she is taking a cough medication and Mucinex which is helping break the mucus loose, but she is still wheezing. Patient is requesting a Z-pak be sent to [facility]. Advised patient that she may need a OV before antibiotic could be sent to the pharmacy. Patient states she has a appointment scheduled on Thursday but is afraid of what the weather may do. Please advise.

## 2015-08-11 NOTE — Telephone Encounter (Signed)
Pt is request a Rx for COPD.  Pt has a cough, congestion and wheezing. [facility].  OI#[phone]/OR

## 2015-08-11 NOTE — Telephone Encounter (Signed)
Pt called back inquiring about her call in this am.  Her call back is [phone]    Thanks, Con Memos

## 2015-08-11 NOTE — Telephone Encounter (Signed)
Sent Z-pak to the pharmacy. Still will need follow up evaluation/appointment on 08-13-15.

## 2015-08-12 NOTE — Telephone Encounter (Signed)
Patient advised Rx has been sent to pharmacy. Patient has been scheduled for a follow up appointment on 08/20/2015. Patient states she does not get paid until 08/19/2015. No appointments available for 08/13/2015.

## 2015-08-20 ENCOUNTER — Ambulatory Visit (INDEPENDENT_AMBULATORY_CARE_PROVIDER_SITE_OTHER): Payer: PPO | Admitting: Family Medicine

## 2015-08-20 ENCOUNTER — Encounter: Payer: Self-pay | Admitting: Family Medicine

## 2015-08-20 VITALS — BP 114/70 | HR 71 | Temp 97.6°F | Resp 16 | Wt 124.6 lb

## 2015-08-20 DIAGNOSIS — L298 Other pruritus: Secondary | ICD-10-CM

## 2015-08-20 DIAGNOSIS — E785 Hyperlipidemia, unspecified: Secondary | ICD-10-CM

## 2015-08-20 DIAGNOSIS — J449 Chronic obstructive pulmonary disease, unspecified: Secondary | ICD-10-CM | POA: Diagnosis not present

## 2015-08-20 NOTE — Progress Notes (Signed)
Subjective:     Patient ID: Stephanie [name], female   DOB: [birth year], 80 y.o.   MRN: [id number]  Urticaria The current episode started more than 1 month ago. The problem has been gradually improving since onset. The affected locations include the head, chest, left arm and right arm. The rash is characterized by itchiness and redness. She was exposed to nothing. Associated symptoms include coughing and shortness of breath. Pertinent negatives include no anorexia, congestion, diarrhea, eye pain, facial edema, fatigue, fever, joint pain, nail changes, rhinorrhea, sore throat or vomiting. Past treatments include anti-itch cream. The treatment provided mild relief.  Shortness of Breath This is a chronic problem. The current episode started more than 1 month ago. The problem occurs intermittently. The problem has been gradually improving. Associated symptoms include a rash. Pertinent negatives include no fever, rhinorrhea, sore throat or vomiting. The symptoms are aggravated by any activity. She has tried steroid inhalers (Patient is currently using inhalers and states that it has helped with breathing, she only has to use inhaler when doing certain physical activities such as climbing stairs) for the symptoms. The treatment provided significant relief. Her past medical history is significant for COPD.  Cough and sputum production much better since finishing the Z-pak 4 days ago. No fever and less wheezing. Still using Ventolin prn with Symbicort and Spiriva.   Patient Active Problem List   Diagnosis Date Noted  . COPD, moderate ([place]) 03/05/2015  . Anxiety and depression 03/04/2015  . Arteriosclerosis of coronary artery 03/04/2015  . CAFL (chronic airflow limitation) ([place]) 03/04/2015  . Breathlessness on exertion 03/04/2015  . HLD (hyperlipidemia) 03/04/2015  . Osteopenia 03/04/2015  . Peptic ulcer 03/04/2015  . Amnesia 03/20/2014  . Disordered sleep 03/20/2014  . Allergy to environmental  factors 11/14/2013  . Gastroduodenal ulcer 11/14/2013   Past Surgical History  Procedure Laterality Date  . Balloon angioplasty, artery  05/17/2009   Family History  Problem Relation Age of Onset  . Hypertension Mother   . Diabetes Mother   . Colon cancer Mother    Social History  Substance Use Topics  . Smoking status: Former Research scientist (life sciences)  . Smokeless tobacco: None     Comment: QUIT IN 2002  . Alcohol Use: No   Allergies  Allergen Reactions  . Duloxetine Hcl   . Influenza Vaccines   . Venlafaxine     GI distress   Current Outpatient Prescriptions on File Prior to Visit  Medication Sig Dispense Refill  . acetaminophen (TYLENOL) 500 MG tablet Take 1 tablet by mouth daily.    Marland Kitchen albuterol (VENTOLIN HFA) 108 (90 BASE) MCG/ACT inhaler Inhale 2 puffs into the lungs every 6 (six) hours as needed.    Marland Kitchen aspirin 325 MG EC tablet Take 1 tablet by mouth daily.    . Cholecalciferol 1000 UNITS capsule Take 1 capsule by mouth daily.    . clonazePAM (KLONOPIN) 0.5 MG tablet Take 1 tablet (0.5 mg total) by mouth 2 (two) times daily as needed for anxiety. 180 tablet 0  . ibuprofen (ADVIL,MOTRIN) 200 MG tablet Take 2 tablets by mouth daily.    . Loratadine 10 MG CAPS Take by mouth.    Marland Kitchen PARoxetine (PAXIL) 10 MG tablet 2 tablets daily by mouth at bedtime 60 tablet 3  . pravastatin (PRAVACHOL) 20 MG tablet Take 1 tablet by mouth daily.    . SYMBICORT 160-4.5 MCG/ACT inhaler INHALE 2 PUFFS TWICE DAILY  3 Inhaler 3  . tiotropium (SPIRIVA HANDIHALER) 18 MCG inhalation  capsule Place 1 capsule into inhaler and inhale daily.     No current facility-administered medications on file prior to visit.    Review of Systems  Constitutional: Negative.  Negative for fever and fatigue.  HENT: Negative.  Negative for congestion, rhinorrhea and sore throat.   Eyes: Negative.  Negative for pain.  Respiratory: Positive for cough and shortness of breath. Negative for apnea and chest tightness.   Cardiovascular:  Negative.   Gastrointestinal: Negative.  Negative for vomiting, diarrhea and anorexia.  Endocrine: Negative.   Genitourinary: Negative.   Musculoskeletal: Negative for joint pain.  Skin: Positive for rash. Negative for nail changes.  Allergic/Immunologic: Negative.   Neurological: Negative.   Hematological: Negative.   Psychiatric/Behavioral: Negative.        Filed Vitals:   08/20/15 1103  BP: 114/70  Pulse: 71  Temp: 97.6 F (36.4 C)  Resp: 16   Objective:   Physical Exam  Constitutional: She is oriented to person, place, and time. She appears well-developed and well-nourished.  HENT:  Head: Normocephalic and atraumatic.  Right Ear: External ear normal.  Left Ear: External ear normal.  Nose: Nose normal.  Mouth/Throat: Oropharynx is clear and moist.  Eyes: Conjunctivae are normal. Pupils are equal, round, and reactive to light.  Neck: Neck supple.  Cardiovascular: Normal rate, regular rhythm and normal heart sounds.   Pulmonary/Chest: Effort normal. She has no rales.  Distant breath sounds with occasional rhonchi that clears with a cough.  Abdominal: Soft. Bowel sounds are normal.  Musculoskeletal: Normal range of motion.  Neurological: She is alert and oriented to person, place, and time.  Skin: Skin is dry. No rash noted.  Psychiatric: She has a normal mood and affect. Her behavior is normal.       Assessment:     1. COPD, moderate ([place]) Pre and Post bronchodilator spirometry confirms severe airway obstruction. Patient feels inhalers are helping and pulse oximetry 96% today on room air. Decline flu vaccination today. Recommend she continue regular use of Symbicort 160/4.5 two puffs BID, Spiriva qd and Ventolin-HFA QID prn. Recheck in 3 months or as needed. - CBC with Differential/Platelet - Spirometry: Pre & Post Eval - Spirometry with graph  2. Winter itch Recurrent itching at night and after a shower. No visible rash most of the time. Recommend Sarna Lotion  and applying Baby Oil after shower to lock in moisture to skin. May use antihistamine (Claritin or Zyrtec prn).  3. HLD (hyperlipidemia) Tolerating Pravastatin without side effects. Will recheck labs and continue low fat diet with Pravastatin 20 mg qd. Recheck pending reports. - Comprehensive metabolic panel - Lipid panel - TSH

## 2015-08-21 ENCOUNTER — Telehealth: Payer: Self-pay | Admitting: Family Medicine

## 2015-08-21 DIAGNOSIS — Z8739 Personal history of other diseases of the musculoskeletal system and connective tissue: Secondary | ICD-10-CM

## 2015-08-21 LAB — CBC WITH DIFFERENTIAL/PLATELET
BASOS: 1 %
Basophils Absolute: 0.1 10*3/uL (ref 0.0–0.2)
EOS (ABSOLUTE): 0.4 10*3/uL (ref 0.0–0.4)
EOS: 5 %
HEMATOCRIT: 37.3 % (ref 34.0–46.6)
HEMOGLOBIN: 12 g/dL (ref 11.1–15.9)
IMMATURE GRANULOCYTES: 0 %
Immature Grans (Abs): 0 10*3/uL (ref 0.0–0.1)
LYMPHS ABS: 1.5 10*3/uL (ref 0.7–3.1)
Lymphs: 19 %
MCH: 30.3 pg (ref 26.6–33.0)
MCHC: 32.2 g/dL (ref 31.5–35.7)
MCV: 94 fL (ref 79–97)
MONOCYTES: 8 %
MONOS ABS: 0.6 10*3/uL (ref 0.1–0.9)
Neutrophils Absolute: 5.2 10*3/uL (ref 1.4–7.0)
Neutrophils: 67 %
Platelets: 285 10*3/uL (ref 150–379)
RBC: 3.96 x10E6/uL (ref 3.77–5.28)
RDW: 14.5 % (ref 12.3–15.4)
WBC: 7.7 10*3/uL (ref 3.4–10.8)

## 2015-08-21 LAB — COMPREHENSIVE METABOLIC PANEL
A/G RATIO: 1.7 (ref 1.1–2.5)
ALBUMIN: 4.3 g/dL (ref 3.5–4.8)
ALT: 13 IU/L (ref 0–32)
AST: 20 IU/L (ref 0–40)
Alkaline Phosphatase: 89 IU/L (ref 39–117)
BUN / CREAT RATIO: 20 (ref 11–26)
BUN: 24 mg/dL (ref 8–27)
Bilirubin Total: <0.2 mg/dL (ref 0.0–1.2)
CO2: 26 mmol/L (ref 18–29)
CREATININE: 1.19 mg/dL — AB (ref 0.57–1.00)
Calcium: 9.9 mg/dL (ref 8.7–10.3)
Chloride: 98 mmol/L (ref 96–106)
GFR, EST AFRICAN AMERICAN: 51 mL/min/{1.73_m2} — AB (ref >59)
GFR, EST NON AFRICAN AMERICAN: 44 mL/min/{1.73_m2} — AB (ref >59)
GLOBULIN, TOTAL: 2.5 g/dL (ref 1.5–4.5)
Glucose: 88 mg/dL (ref 65–99)
POTASSIUM: 5.4 mmol/L — AB (ref 3.5–5.2)
SODIUM: 141 mmol/L (ref 134–144)
Total Protein: 6.8 g/dL (ref 6.0–8.5)

## 2015-08-21 LAB — TSH: TSH: 2.27 u[IU]/mL (ref 0.450–4.500)

## 2015-08-21 LAB — LIPID PANEL
CHOL/HDL RATIO: 2 ratio (ref 0.0–4.4)
Cholesterol, Total: 236 mg/dL — ABNORMAL HIGH (ref 100–199)
HDL: 118 mg/dL (ref >39)
LDL CALC: 100 mg/dL — AB (ref 0–99)
Triglycerides: 89 mg/dL (ref 0–149)
VLDL Cholesterol Cal: 18 mg/dL (ref 5–40)

## 2015-08-21 NOTE — Telephone Encounter (Signed)
Patient's last BMD was 11/15/2011.

## 2015-08-21 NOTE — Telephone Encounter (Signed)
Order for BMD screening of past history of osteopenia placed in chart to be scheduled.

## 2015-08-21 NOTE — Telephone Encounter (Signed)
Pt stated that she would like have another bone destiny test done. Pt stated in the past she has had it done at [facility]. Pt had her CPE in 06/2015 and forgot to discuss it with Simona Huh at her OV on 08/20/15. Please advise. Thanks TNP

## 2015-08-22 ENCOUNTER — Telehealth: Payer: Self-pay

## 2015-08-22 NOTE — Telephone Encounter (Signed)
-----   Message from [place], [place] sent at 08/21/2015  5:58 PM EST ----- Blood tests essentially normal with slight strain on kidney function tests and border line LDL cholesterol (but HDL extremely GOOD). Continue present medications and will schedule BMD test.

## 2015-08-22 NOTE — Telephone Encounter (Signed)
Patient advised as directed below.  Thanks,  -Arkin Imran 

## 2015-08-24 NOTE — Telephone Encounter (Signed)
Patient advised BMD has been ordered and Judson Roch will call her with appointment date and time.

## 2015-08-26 DIAGNOSIS — M8580 Other specified disorders of bone density and structure, unspecified site: Secondary | ICD-10-CM | POA: Diagnosis not present

## 2015-08-26 LAB — HM DEXA SCAN

## 2015-08-31 ENCOUNTER — Ambulatory Visit: Payer: Commercial Managed Care - HMO | Admitting: Family Medicine

## 2015-09-01 ENCOUNTER — Encounter: Payer: Self-pay | Admitting: Family Medicine

## 2015-09-01 ENCOUNTER — Ambulatory Visit (INDEPENDENT_AMBULATORY_CARE_PROVIDER_SITE_OTHER): Payer: PPO | Admitting: Family Medicine

## 2015-09-01 ENCOUNTER — Telehealth: Payer: Self-pay | Admitting: Family Medicine

## 2015-09-01 VITALS — BP 146/84 | HR 70 | Temp 97.6°F | Resp 16 | Wt 127.2 lb

## 2015-09-01 DIAGNOSIS — R252 Cramp and spasm: Secondary | ICD-10-CM | POA: Diagnosis not present

## 2015-09-01 DIAGNOSIS — F418 Other specified anxiety disorders: Secondary | ICD-10-CM | POA: Diagnosis not present

## 2015-09-01 MED ORDER — CLONAZEPAM 0.5 MG PO TABS: 0.5000 mg | ORAL_TABLET | Freq: Two times a day (BID) | ORAL | Status: DC | PRN

## 2015-09-01 NOTE — Progress Notes (Signed)
Patient ID: Stephanie [name], female   DOB: 23-Apr-1936, 80 y.o.   MRN: [id number]   Patient: Stephanie [name] Female    DOB: 1936/04/16   80 y.o.   MRN: [id number] Visit Date: 09/01/2015  Today's Provider: Vernie Murders, PA   Chief Complaint  Patient presents with  . Extremity Weakness   Subjective:    Extremity Weakness  The pain is present in the left lower leg, right lower leg, right upper leg and left upper leg. This is a new problem. The current episode started 1 to 4 weeks ago. The problem occurs intermittently. Quality: cramping. Associated symptoms include numbness. The symptoms are aggravated by standing.   Patient Active Problem List   Diagnosis Date Noted  . COPD, moderate ([place]) 03/05/2015  . Anxiety and depression 03/04/2015  . Arteriosclerosis of coronary artery 03/04/2015  . CAFL (chronic airflow limitation) ([place]) 03/04/2015  . Breathlessness on exertion 03/04/2015  . HLD (hyperlipidemia) 03/04/2015  . Osteopenia 03/04/2015  . Peptic ulcer 03/04/2015  . Amnesia 03/20/2014  . Disordered sleep 03/20/2014  . Allergy to environmental factors 11/14/2013  . Gastroduodenal ulcer 11/14/2013   Past Surgical History  Procedure Laterality Date  . Balloon angioplasty, artery  05/17/2009   Family History  Problem Relation Age of Onset  . Hypertension Mother   . Diabetes Mother   . Colon cancer Mother    Allergies  Allergen Reactions  . Duloxetine Hcl   . Influenza Vaccines   . Venlafaxine     GI distress     Previous Medications   ACETAMINOPHEN (TYLENOL) 500 MG TABLET    Take 1 tablet by mouth daily.   ALBUTEROL (VENTOLIN HFA) 108 (90 BASE) MCG/ACT INHALER    Inhale 2 puffs into the lungs every 6 (six) hours as needed.   ASPIRIN 325 MG EC TABLET    Take 1 tablet by mouth daily.   CHOLECALCIFEROL 1000 UNITS CAPSULE    Take 1 capsule by mouth daily.   CLONAZEPAM (KLONOPIN) 0.5 MG TABLET    Take 1 tablet (0.5 mg total) by mouth 2 (two) times daily as needed  for anxiety.   IBUPROFEN (ADVIL,MOTRIN) 200 MG TABLET    Take 2 tablets by mouth daily.   LORATADINE 10 MG CAPS    Take by mouth.   PAROXETINE (PAXIL) 10 MG TABLET    2 tablets daily by mouth at bedtime   PRAVASTATIN (PRAVACHOL) 20 MG TABLET    Take 1 tablet by mouth daily.   SYMBICORT 160-4.5 MCG/ACT INHALER    INHALE 2 PUFFS TWICE DAILY    TIOTROPIUM (SPIRIVA HANDIHALER) 18 MCG INHALATION CAPSULE    Place 1 capsule into inhaler and inhale daily.    Review of Systems  Constitutional: Negative.   HENT: Negative.   Eyes: Negative.   Respiratory: Negative.   Cardiovascular: Negative.   Gastrointestinal: Negative.   Endocrine: Negative.   Genitourinary: Negative.   Musculoskeletal: Positive for myalgias and extremity weakness.  Skin: Negative.   Allergic/Immunologic: Negative.   Neurological: Positive for numbness.  Hematological: Negative.   Psychiatric/Behavioral: Negative.     Social History  Substance Use Topics  . Smoking status: Former Research scientist (life sciences)  . Smokeless tobacco: Not on file     Comment: QUIT IN 2002  . Alcohol Use: No   Objective:   BP 146/84 mmHg  Pulse 70  Temp(Src) 97.6 F (36.4 C) (Oral)  Resp 16  Wt 127 lb 3.2 oz (57.698 kg) BP Readings from Last 3 Encounters:  09/01/15 146/84  08/20/15 114/70  06/29/15 142/84    Physical Exam  Constitutional: She is oriented to person, place, and time. She appears well-developed and well-nourished.  HENT:  Head: Normocephalic.  Eyes: EOM are normal.  Neck: Neck supple.  Cardiovascular: Normal rate and regular rhythm.   Pulmonary/Chest: Effort normal and breath sounds normal.  Abdominal: Soft. Bowel sounds are normal.  Musculoskeletal: She exhibits no edema or tenderness.  Neurological: She is alert and oriented to person, place, and time. She has normal reflexes.   Recent Results (from the past 2160 hour(s))  CBC with Differential/Platelet     Status: None   Collection Time: 08/20/15 12:33 PM  Result Value Ref  Range   WBC 7.7 3.4 - 10.8 x10E3/uL   RBC 3.96 3.77 - 5.28 x10E6/uL   Hemoglobin 12.0 11.1 - 15.9 g/dL   Hematocrit 37.3 34.0 - 46.6 %   MCV 94 79 - 97 fL   MCH 30.3 26.6 - 33.0 pg   MCHC 32.2 31.5 - 35.7 g/dL   RDW 14.5 12.3 - 15.4 %   Platelets 285 150 - 379 x10E3/uL   Neutrophils 67 %   Lymphs 19 %   Monocytes 8 %   Eos 5 %   Basos 1 %   Neutrophils Absolute 5.2 1.4 - 7.0 x10E3/uL   Lymphocytes Absolute 1.5 0.7 - 3.1 x10E3/uL   Monocytes Absolute 0.6 0.1 - 0.9 x10E3/uL   EOS (ABSOLUTE) 0.4 0.0 - 0.4 x10E3/uL   Basophils Absolute 0.1 0.0 - 0.2 x10E3/uL   Immature Granulocytes 0 %   Immature Grans (Abs) 0.0 0.0 - 0.1 x10E3/uL  Comprehensive metabolic panel     Status: Abnormal   Collection Time: 08/20/15 12:33 PM  Result Value Ref Range   Glucose 88 65 - 99 mg/dL   BUN 24 8 - 27 mg/dL   Creatinine, Ser 1.19 (H) 0.57 - 1.00 mg/dL   GFR calc non Af Amer 44 (L) >59 mL/min/1.73   GFR calc Af Amer 51 (L) >59 mL/min/1.73   BUN/Creatinine Ratio 20 11 - 26   Sodium 141 134 - 144 mmol/L   Potassium 5.4 (H) 3.5 - 5.2 mmol/L   Chloride 98 96 - 106 mmol/L   CO2 26 18 - 29 mmol/L   Calcium 9.9 8.7 - 10.3 mg/dL   Total Protein 6.8 6.0 - 8.5 g/dL   Albumin 4.3 3.5 - 4.8 g/dL   Globulin, Total 2.5 1.5 - 4.5 g/dL   Albumin/Globulin Ratio 1.7 1.1 - 2.5   Bilirubin Total <0.2 0.0 - 1.2 mg/dL   Alkaline Phosphatase 89 39 - 117 IU/L   AST 20 0 - 40 IU/L   ALT 13 0 - 32 IU/L  Lipid panel     Status: Abnormal   Collection Time: 08/20/15 12:33 PM  Result Value Ref Range   Cholesterol, Total 236 (H) 100 - 199 mg/dL   Triglycerides 89 0 - 149 mg/dL   HDL 118 >39 mg/dL   VLDL Cholesterol Cal 18 5 - 40 mg/dL   LDL Calculated 100 (H) 0 - 99 mg/dL   Chol/HDL Ratio 2.0 0.0 - 4.4 ratio units    Comment:                                   T. Chol/HDL Ratio  Men  Women                               1/2 Avg.Risk  3.4    3.3                                    Avg.Risk  5.0    4.4                                2X Avg.Risk  9.6    7.1                                3X Avg.Risk 23.4   11.0   TSH     Status: None   Collection Time: 08/20/15 12:33 PM  Result Value Ref Range   TSH 2.270 0.450 - 4.500 uIU/mL  HM DEXA SCAN     Status: None   Collection Time: 08/26/15 12:00 AM  Result Value Ref Range   HM Dexa Scan osteopenia        Assessment & Plan:      1. Muscle cramps Intermittent at night. No recent heavy diuresis. Clonazepam helps along with controlling anxiety and helping with sleep. May try Tonic water in soft drink (4-6 ounces) at night and keep legs warm. Recent labs 08-20-15 showed no deficiency of electrolytes. Recheck if no better in 3-4 weeks.  2. Anxiety associated with depression Feels Clonazepam BID and Paxil 10 mg BID has controlled symptoms and improved affect. Bright spirits today. Requests refill of Clonazepam. Recheck in 3 months prn. - clonazePAM (KLONOPIN) 0.5 MG tablet; Take 1 tablet (0.5 mg total) by mouth 2 (two) times daily as needed for anxiety.  Dispense: 180 tablet; Refill: 0

## 2015-09-01 NOTE — Telephone Encounter (Signed)
Pt needs instructions regarding her Vit D and potassium pills.  Not sure of the dosage.  Her call back is [phone]  Thank sTeri

## 2015-09-01 NOTE — Patient Instructions (Signed)

## 2015-09-02 NOTE — Telephone Encounter (Signed)
Patient reports that she started taking potassium with vit d 1200 mg-800 units 2 tablets daily. Patient reports you recommended taking potassium and vitamin d septately. Patient reports she only found the one combined and wants to know if this is okay to take.Stephanie [name]

## 2015-09-02 NOTE — Telephone Encounter (Signed)
Should not be taking any potassium. Was supposed to take Calcium 1200 mg daily and she was already taking Vitamin-D 1000 IU daily at home. Should be able to find 600 mg or 1200 mg Calcium over the counter.

## 2015-09-02 NOTE — Telephone Encounter (Signed)
Sorry about the confusion pt is taking calcium 600 mg with vit d 800 units 2 tablets daily. Patient reports that she was not able to find calcium 1200 mg on its own. Patient reports that she is not taking the vitamin d on its own.

## 2015-09-03 NOTE — Telephone Encounter (Signed)
This will be just fine.

## 2015-09-11 ENCOUNTER — Encounter: Payer: Self-pay | Admitting: Family Medicine

## 2015-09-21 ENCOUNTER — Ambulatory Visit: Payer: Commercial Managed Care - HMO | Admitting: Family Medicine

## 2015-09-21 ENCOUNTER — Other Ambulatory Visit: Payer: Self-pay | Admitting: Family Medicine

## 2015-09-21 NOTE — Telephone Encounter (Signed)
Pt contacted office for refill request on the following medications:  [facility] mail order.  90 day supply.  CB#[phone]/MW  SYMBICORT 160-4.5 MCG/ACT inhaler  albuterol (VENTOLIN HFA) 108 (90 BASE) MCG/ACT inhaler  tiotropium (SPIRIVA HANDIHALER) 18 MCG inhalation capsule

## 2015-09-22 MED ORDER — BUDESONIDE-FORMOTEROL FUMARATE 160-4.5 MCG/ACT IN AERO: 2.0000 | INHALATION_SPRAY | Freq: Two times a day (BID) | RESPIRATORY_TRACT | Status: DC

## 2015-09-22 MED ORDER — TIOTROPIUM BROMIDE MONOHYDRATE 18 MCG IN CAPS: 1.0000 | ORAL_CAPSULE | Freq: Every day | RESPIRATORY_TRACT | Status: DC

## 2015-09-22 MED ORDER — ALBUTEROL SULFATE HFA 108 (90 BASE) MCG/ACT IN AERS: 2.0000 | INHALATION_SPRAY | Freq: Four times a day (QID) | RESPIRATORY_TRACT | Status: DC | PRN

## 2015-09-22 NOTE — Telephone Encounter (Signed)
Pt called to check on the status of the refill request. Thanks TNP

## 2015-10-23 ENCOUNTER — Telehealth: Payer: Self-pay | Admitting: Family Medicine

## 2015-10-23 MED ORDER — AZITHROMYCIN 250 MG PO TABS: ORAL_TABLET | ORAL | Status: DC

## 2015-10-23 NOTE — Telephone Encounter (Signed)
Pt contacted office for refill request on the following medications: azithromycin (ZITHROMAX) 250 MG tablet to [place]. Pt stated she started coughing and wheezing yesterday and wanted to get the medication and stop the cough from going deeper in her chest. Pt would like it sent today if possible. Please advise. Thanks TNP

## 2015-10-23 NOTE — Telephone Encounter (Signed)
Patient called again to see if medication was filled into the pharmacy.

## 2015-10-23 NOTE — Telephone Encounter (Signed)
Having cough and sputum production. Using Albuterol inhaler, Symbicort, Spiriva and Mucinex-DM. Denies fever, head congestion or sore throat. Will give Z-pak. Advised to come to the office if no better in 5 days. (Unable to come to the office today due to damaged car in the shop for repairs.)

## 2015-10-23 NOTE — Telephone Encounter (Signed)
Please review

## 2015-10-27 ENCOUNTER — Ambulatory Visit (INDEPENDENT_AMBULATORY_CARE_PROVIDER_SITE_OTHER): Payer: PPO | Admitting: Family Medicine

## 2015-10-27 ENCOUNTER — Encounter: Payer: Self-pay | Admitting: Family Medicine

## 2015-10-27 VITALS — BP 124/58 | HR 81 | Temp 98.2°F | Resp 18

## 2015-10-27 DIAGNOSIS — J441 Chronic obstructive pulmonary disease with (acute) exacerbation: Secondary | ICD-10-CM | POA: Diagnosis not present

## 2015-10-27 MED ORDER — PREDNISONE 10 MG PO TABS: ORAL_TABLET | ORAL | Status: DC

## 2015-10-27 MED ORDER — IPRATROPIUM-ALBUTEROL 0.5-2.5 (3) MG/3ML IN SOLN
3.0000 mL | Freq: Once | RESPIRATORY_TRACT | Status: AC
  Administered 2015-10-27: 3 mL via RESPIRATORY_TRACT

## 2015-10-27 NOTE — Progress Notes (Signed)
Patient ID: Stephanie [name], female   DOB: 05-29-1936, 80 y.o.   MRN: [id number]   Patient: Stephanie [name] Female    DOB: Oct 02, 1935   80 y.o.   MRN: [id number] Visit Date: 10/27/2015  Today's Provider: Vernie Murders, PA   Chief Complaint  Patient presents with  . Wheezing   Subjective:    Wheezing  This is a recurrent problem. The current episode started in the past 7 days. The problem occurs constantly. The problem has been gradually worsening. Associated symptoms include coughing. The symptoms are aggravated by weather changes. She has tried OTC cough suppressant for the symptoms. The treatment provided mild relief. Her past medical history is significant for COPD.  Started having cough and wheezing with dyspnea 5-6 days ago. Was started on Z-pak with Ventolin-HFA, Symbicort, Spiriva and Mucinex with Delsym on 10-23-15. Denies any fever, rhinorrhea or headache. Feeling fatigued and slightly dizzy in the office today in the waiting room. Had to be brought to the exam room by wheelchair. States appetite has been good.  Patient Active Problem List   Diagnosis Date Noted  . COPD, moderate ([place]) 03/05/2015  . Anxiety and depression 03/04/2015  . Arteriosclerosis of coronary artery 03/04/2015  . CAFL (chronic airflow limitation) ([facility]) 03/04/2015  . Breathlessness on exertion 03/04/2015  . HLD (hyperlipidemia) 03/04/2015  . Osteopenia 03/04/2015  . Peptic ulcer 03/04/2015  . Amnesia 03/20/2014  . Disordered sleep 03/20/2014  . Allergy to environmental factors 11/14/2013  . Gastroduodenal ulcer 11/14/2013   Past Surgical History  Procedure Laterality Date  . Balloon angioplasty, artery  05/17/2009   Family History  Problem Relation Age of Onset  . Hypertension Mother   . Diabetes Mother   . Colon cancer Mother     Previous Medications   ACETAMINOPHEN (TYLENOL) 500 MG TABLET    Take 1 tablet by mouth daily.   ALBUTEROL (VENTOLIN HFA) 108 (90 BASE) MCG/ACT INHALER     Inhale 2 puffs into the lungs every 6 (six) hours as needed.   ASPIRIN 325 MG EC TABLET    Take 1 tablet by mouth daily.   BUDESONIDE-FORMOTEROL (SYMBICORT) 160-4.5 MCG/ACT INHALER    Inhale 2 puffs into the lungs 2 (two) times daily.   CHOLECALCIFEROL 1000 UNITS CAPSULE    Take 1 capsule by mouth daily.   CLONAZEPAM (KLONOPIN) 0.5 MG TABLET    Take 1 tablet (0.5 mg total) by mouth 2 (two) times daily as needed for anxiety.   IBUPROFEN (ADVIL,MOTRIN) 200 MG TABLET    Take 2 tablets by mouth daily.   LORATADINE 10 MG CAPS    Take by mouth.   PAROXETINE (PAXIL) 10 MG TABLET    2 tablets daily by mouth at bedtime   PRAVASTATIN (PRAVACHOL) 20 MG TABLET    Take 1 tablet by mouth daily.   TIOTROPIUM (SPIRIVA HANDIHALER) 18 MCG INHALATION CAPSULE    Place 1 capsule (18 mcg total) into inhaler and inhale daily.   Allergies  Allergen Reactions  . Duloxetine Hcl   . Influenza Vaccines   . Venlafaxine     GI distress    Review of Systems  Constitutional: Negative.   HENT: Negative.   Eyes: Negative.   Respiratory: Positive for cough and wheezing.   Cardiovascular: Negative.   Gastrointestinal: Negative.   Endocrine: Negative.   Genitourinary: Negative.   Musculoskeletal: Negative.   Skin: Negative.   Allergic/Immunologic: Negative.   Neurological: Negative.   Hematological: Negative.   Psychiatric/Behavioral: Negative.  Social History  Substance Use Topics  . Smoking status: Former Research scientist (life sciences)  . Smokeless tobacco: Not on file     Comment: QUIT IN 2002  . Alcohol Use: No   Objective:   BP 124/58 mmHg  Pulse 81  Temp(Src) 98.2 F (36.8 C) (Oral)  Resp 18  SpO2 93%  Physical Exam  Constitutional: She is oriented to person, place, and time. She appears well-developed and well-nourished. No distress.  HENT:  Head: Normocephalic and atraumatic.  Right Ear: Hearing and external ear normal.  Left Ear: Hearing and external ear normal.  Nose: Nose normal.  Mouth/Throat: Oropharynx  is clear and moist.  Eyes: Conjunctivae, EOM and lids are normal. Right eye exhibits no discharge. Left eye exhibits no discharge. No scleral icterus.  Neck: Normal range of motion. Neck supple.  Cardiovascular: Normal rate and regular rhythm.   Pulmonary/Chest: Effort normal. No respiratory distress.  Distant rough breath sounds with slight wheeze and rhonchi.  Musculoskeletal: Normal range of motion.  Neurological: She is alert and oriented to person, place, and time.  Skin: Skin is intact. No lesion and no rash noted.  Psychiatric: She has a normal mood and affect. Her speech is normal and behavior is normal. Thought content normal.      Assessment & Plan:     1. COPD with acute exacerbation ([place]) Onset over the past 5 days. Occasional light headed sensation when she stands quickly. Pulse oximetry up to 96% after nebulizer treatment with Duoneb. Has finished the Z-pak today and still on Symbicort with Spiriva and prn Ventolin-HFA. Will add prednisone taper and encouraged to drink extra fluids with 3 meals a day. Recheck if no better in 6 days. May need referral to pulmonologist if worsening. May use Mucinex with Delsym prn cough or congestion. - ipratropium-albuterol (DUONEB) 0.5-2.5 (3) MG/3ML nebulizer solution 3 mL; Take 3 mLs by nebulization once. - predniSONE (DELTASONE) 10 MG tablet; Take 2 tablets 3 times a day for 1 day then taper down by one tablet daily until all taken by mouth (6,5,4,3,2,1)  Dispense: 21 tablet; Refill: 0

## 2015-11-03 ENCOUNTER — Ambulatory Visit (INDEPENDENT_AMBULATORY_CARE_PROVIDER_SITE_OTHER): Payer: PPO | Admitting: Family Medicine

## 2015-11-03 ENCOUNTER — Ambulatory Visit
Admission: RE | Admit: 2015-11-03 | Discharge: 2015-11-03 | Disposition: A | Discharge status: Home / Self Care | Payer: PPO | Source: Ambulatory Visit | Attending: Family Medicine | Admitting: Family Medicine

## 2015-11-03 ENCOUNTER — Telehealth: Payer: Self-pay | Admitting: Family Medicine

## 2015-11-03 ENCOUNTER — Encounter: Payer: Self-pay | Admitting: Family Medicine

## 2015-11-03 VITALS — BP 118/60 | HR 62 | Temp 98.1°F | Resp 14 | Wt 126.4 lb

## 2015-11-03 DIAGNOSIS — R42 Dizziness and giddiness: Secondary | ICD-10-CM

## 2015-11-03 DIAGNOSIS — J440 Chronic obstructive pulmonary disease with acute lower respiratory infection: Secondary | ICD-10-CM

## 2015-11-03 DIAGNOSIS — J449 Chronic obstructive pulmonary disease, unspecified: Secondary | ICD-10-CM | POA: Diagnosis not present

## 2015-11-03 DIAGNOSIS — J209 Acute bronchitis, unspecified: Secondary | ICD-10-CM

## 2015-11-03 NOTE — Telephone Encounter (Signed)
Patient called office today with complaints of light headiness and blurred vision since Sunday. Patient reports that you had put her on Prednisone and that she finished medication on Sunday, she states Monday morning she woke up feeling light headed. Patient described feeling as "feeling off balance",associated she states that she has blurred vision. Patient denied, headache, chest pain, paresthesias, headache, difficulty with speech or decreased mobility. Patient believes she is suffering these symptoms as a reaction to Prednisone. I advised patient to come into office today for evaluation, the only providers this afternoon with openings is Dr. Caryn Section and Dr. Rosanna Randy. Patient has declined seeing any other physician but you, you have no openings would you like to add patient on to your schedule? Please advise, Amparo Bristol

## 2015-11-03 NOTE — Progress Notes (Signed)
Patient ID: Stephanie [name], female   DOB: February 09, 1936, 80 y.o.   MRN: [id number]   Patient: Stephanie [name] Female    DOB: 03-01-1936   80 y.o.   MRN: [id number] Visit Date: 11/03/2015  Today's Provider: Vernie Murders, PA   Chief Complaint  Patient presents with  . Dizziness   Subjective:    Dizziness This is a new problem. The current episode started in the past 7 days. The problem occurs intermittently. The problem has been gradually improving. Associated symptoms include a visual change. The symptoms are aggravated by standing. She has tried nothing for the symptoms.    Patient Active Problem List   Diagnosis Date Noted  . COPD, moderate ([place]) 03/05/2015  . Anxiety and depression 03/04/2015  . Arteriosclerosis of coronary artery 03/04/2015  . CAFL (chronic airflow limitation) ([place]) 03/04/2015  . Breathlessness on exertion 03/04/2015  . HLD (hyperlipidemia) 03/04/2015  . Osteopenia 03/04/2015  . Peptic ulcer 03/04/2015  . Amnesia 03/20/2014  . Disordered sleep 03/20/2014  . Allergy to environmental factors 11/14/2013  . Gastroduodenal ulcer 11/14/2013   Past Surgical History  Procedure Laterality Date  . Balloon angioplasty, artery  05/17/2009   Family History  Problem Relation Age of Onset  . Hypertension Mother   . Diabetes Mother   . Colon cancer Mother    Previous Medications   ACETAMINOPHEN (TYLENOL) 500 MG TABLET    Take 1 tablet by mouth daily.   ALBUTEROL (VENTOLIN HFA) 108 (90 BASE) MCG/ACT INHALER    Inhale 2 puffs into the lungs every 6 (six) hours as needed.   ASPIRIN 325 MG EC TABLET    Take 1 tablet by mouth daily.   BUDESONIDE-FORMOTEROL (SYMBICORT) 160-4.5 MCG/ACT INHALER    Inhale 2 puffs into the lungs 2 (two) times daily.   CHOLECALCIFEROL 1000 UNITS CAPSULE    Take 1 capsule by mouth daily.   CLONAZEPAM (KLONOPIN) 0.5 MG TABLET    Take 1 tablet (0.5 mg total) by mouth 2 (two) times daily as needed for anxiety.   IBUPROFEN (ADVIL,MOTRIN)  200 MG TABLET    Take 2 tablets by mouth daily.   LORATADINE 10 MG CAPS    Take by mouth.   PAROXETINE (PAXIL) 10 MG TABLET    2 tablets daily by mouth at bedtime   PRAVASTATIN (PRAVACHOL) 20 MG TABLET    Take 1 tablet by mouth daily.   TIOTROPIUM (SPIRIVA HANDIHALER) 18 MCG INHALATION CAPSULE    Place 1 capsule (18 mcg total) into inhaler and inhale daily.   Allergies  Allergen Reactions  . Duloxetine Hcl   . Influenza Vaccines   . Venlafaxine     GI distress     Review of Systems  Constitutional: Negative.   HENT: Negative.   Eyes: Positive for photophobia and visual disturbance.  Respiratory: Negative.   Cardiovascular: Negative.   Gastrointestinal: Negative.   Endocrine: Negative.   Genitourinary: Negative.   Musculoskeletal: Negative.   Skin: Negative.   Allergic/Immunologic: Negative.   Neurological: Positive for dizziness.  Hematological: Negative.   Psychiatric/Behavioral: Negative.     Social History  Substance Use Topics  . Smoking status: Former Research scientist (life sciences)  . Smokeless tobacco: Not on file     Comment: QUIT IN 2002  . Alcohol Use: No   Objective:   BP 118/60 mmHg  Pulse 62  Temp(Src) 98.1 F (36.7 C) (Oral)  Resp 14  Wt 126 lb 6.4 oz (57.335 kg)  SpO2 95%  Physical Exam  Constitutional:  She is oriented to person, place, and time. She appears well-developed and well-nourished.  HENT:  Head: Normocephalic.  Right Ear: External ear normal.  Left Ear: External ear normal.  Nose: Nose normal.  Mouth/Throat: Oropharynx is clear and moist.  Eyes: Conjunctivae and EOM are normal.  Neck: Normal range of motion. Neck supple.  Cardiovascular: Normal rate, regular rhythm and normal heart sounds.   Pulmonary/Chest:  Distant breath sounds with slight wheeze.  Abdominal: Soft. Bowel sounds are normal.  Lymphadenopathy:    She has no cervical adenopathy.  Neurological: She is alert and oriented to person, place, and time. Coordination normal.      Assessment &  Plan:      1. COPD with acute bronchitis ([place]) Finished the prednisone taper 2 days ago. Has been using Ventolin frequently and pupils dilated today. Still using Symbicort 160-4.5 mcg/ACT 2 sprays BID with Spiriva 18 mcg qd. Given nebulizer treatment with Xopenex and feeling much better. Having frequent flares COPD exacerbations. Will get CXR to rule out lesion versus pneumonia. Schedule pulmonology referral. - DG Chest 2 View - Ambulatory referral to Pulmonology  2. Dizziness Recent flare of dizziness with dyspnea. No palpitations, chest pains, carotid bruits or recent GI upset/dehydration. Will get routine labs and recheck pending reports. - CBC with Differential/Platelet - Comprehensive metabolic panel - Ambulatory referral to Pulmonology

## 2015-11-03 NOTE — Telephone Encounter (Signed)
Schedule work-in appointment at 2:45pm.

## 2015-11-04 ENCOUNTER — Telehealth: Payer: Self-pay | Admitting: Family Medicine

## 2015-11-04 LAB — COMPREHENSIVE METABOLIC PANEL WITH GFR
ALT: 26 IU/L (ref 0–32)
AST: 21 IU/L (ref 0–40)
Albumin/Globulin Ratio: 1.8 (ref 1.2–2.2)
Albumin: 4.1 g/dL (ref 3.5–4.8)
Alkaline Phosphatase: 83 IU/L (ref 39–117)
BUN/Creatinine Ratio: 24 (ref 11–26)
BUN: 27 mg/dL (ref 8–27)
Bilirubin Total: <0.2 mg/dL (ref 0.0–1.2)
CO2: 27 mmol/L (ref 18–29)
Calcium: 9.9 mg/dL (ref 8.7–10.3)
Chloride: 92 mmol/L — ABNORMAL LOW (ref 96–106)
Creatinine, Ser: 1.12 mg/dL — ABNORMAL HIGH (ref 0.57–1.00)
GFR calc Af Amer: 55 mL/min/1.73 — ABNORMAL LOW
GFR calc non Af Amer: 47 mL/min/1.73 — ABNORMAL LOW
Globulin, Total: 2.3 g/dL (ref 1.5–4.5)
Glucose: 94 mg/dL (ref 65–99)
Potassium: 5.5 mmol/L — ABNORMAL HIGH (ref 3.5–5.2)
Sodium: 136 mmol/L (ref 134–144)
Total Protein: 6.4 g/dL (ref 6.0–8.5)

## 2015-11-04 LAB — CBC WITH DIFFERENTIAL/PLATELET
Basophils Absolute: 0.1 x10E3/uL (ref 0.0–0.2)
Basos: 0 %
EOS (ABSOLUTE): 0.2 x10E3/uL (ref 0.0–0.4)
Eos: 2 %
Hematocrit: 38.1 % (ref 34.0–46.6)
Hemoglobin: 12.5 g/dL (ref 11.1–15.9)
Immature Grans (Abs): 0.7 x10E3/uL — ABNORMAL HIGH (ref 0.0–0.1)
Immature Granulocytes: 5 %
Lymphocytes Absolute: 2.6 x10E3/uL (ref 0.7–3.1)
Lymphs: 21 %
MCH: 29.8 pg (ref 26.6–33.0)
MCHC: 32.8 g/dL (ref 31.5–35.7)
MCV: 91 fL (ref 79–97)
Monocytes Absolute: 1 x10E3/uL — ABNORMAL HIGH (ref 0.1–0.9)
Monocytes: 8 %
Neutrophils Absolute: 8.2 x10E3/uL — ABNORMAL HIGH (ref 1.4–7.0)
Neutrophils: 64 %
Platelets: 301 x10E3/uL (ref 150–379)
RBC: 4.19 x10E6/uL (ref 3.77–5.28)
RDW: 14 % (ref 12.3–15.4)
WBC: 12.8 x10E3/uL — ABNORMAL HIGH (ref 3.4–10.8)

## 2015-11-04 NOTE — Telephone Encounter (Signed)
Pt daughter Beverlee Nims called to get results.  CB#[phone]/MW

## 2015-11-05 ENCOUNTER — Other Ambulatory Visit: Payer: Self-pay | Admitting: Family Medicine

## 2015-11-05 DIAGNOSIS — J441 Chronic obstructive pulmonary disease with (acute) exacerbation: Secondary | ICD-10-CM

## 2015-11-05 MED ORDER — LEVOFLOXACIN 500 MG PO TABS: 500.0000 mg | ORAL_TABLET | Freq: Every day | ORAL | Status: DC

## 2015-11-05 NOTE — Telephone Encounter (Signed)
See lab result note for instructions.

## 2015-11-05 NOTE — Telephone Encounter (Signed)
Advised Stephanie [name] of results as below.

## 2015-11-05 NOTE — Telephone Encounter (Signed)
-----   Message from [employer], [place] sent at 11/05/2015 11:13 AM EDT ----- WBC count slightly elevated with increase in neutrophils which indicates some bacterial infection. Chest x-ray was negative for pneumonia. Probably acute exacerbation of COPD with bronchitis, again. Recommend Levofloxacin (sent to [facility]) and proceed with referral to pulmonologist. If having more cough and wheeze with dyspnea, may need to come back in for another nebulizer treatment.

## 2015-11-19 DIAGNOSIS — J439 Emphysema, unspecified: Secondary | ICD-10-CM | POA: Diagnosis not present

## 2015-11-19 DIAGNOSIS — R0602 Shortness of breath: Secondary | ICD-10-CM | POA: Diagnosis not present

## 2015-12-11 ENCOUNTER — Encounter: Payer: Self-pay | Admitting: Family Medicine

## 2016-01-05 ENCOUNTER — Other Ambulatory Visit: Payer: Self-pay | Admitting: Family Medicine

## 2016-01-05 DIAGNOSIS — F418 Other specified anxiety disorders: Secondary | ICD-10-CM

## 2016-01-05 MED ORDER — CLONAZEPAM 0.5 MG PO TABS: 0.5000 mg | ORAL_TABLET | Freq: Two times a day (BID) | ORAL | Status: DC | PRN

## 2016-01-05 MED ORDER — TIOTROPIUM BROMIDE MONOHYDRATE 18 MCG IN CAPS: 1.0000 | ORAL_CAPSULE | Freq: Every day | RESPIRATORY_TRACT | Status: DC

## 2016-01-05 NOTE — Telephone Encounter (Signed)
Pt needs refills on    clonazePAM (KLONOPIN) 0.5 MG tablet Taking 09/01/15 -- Vickki Muff Chrismon, PA Take 1 tablet (0.5 mg total) by mouth 2 (two) times daily as needed for anxiety  tiotropium (SPIRIVA HANDIHALER) 18 MCG inhalation capsule Taking 09/22/15 -- Margo Common, PA   She uses [facility]

## 2016-01-05 NOTE — Telephone Encounter (Signed)
Rx called in to pharmacy. 

## 2016-01-19 DIAGNOSIS — H2513 Age-related nuclear cataract, bilateral: Secondary | ICD-10-CM | POA: Diagnosis not present

## 2016-01-20 ENCOUNTER — Other Ambulatory Visit: Payer: Self-pay | Admitting: Family Medicine

## 2016-02-17 DIAGNOSIS — R0609 Other forms of dyspnea: Secondary | ICD-10-CM | POA: Diagnosis not present

## 2016-02-17 DIAGNOSIS — J439 Emphysema, unspecified: Secondary | ICD-10-CM | POA: Diagnosis not present

## 2016-03-14 DIAGNOSIS — H2512 Age-related nuclear cataract, left eye: Secondary | ICD-10-CM | POA: Diagnosis not present

## 2016-04-19 DIAGNOSIS — F329 Major depressive disorder, single episode, unspecified: Secondary | ICD-10-CM | POA: Diagnosis not present

## 2016-04-19 DIAGNOSIS — F03A0 Unspecified dementia, mild, without behavioral disturbance, psychotic disturbance, mood disturbance, and anxiety: Secondary | ICD-10-CM | POA: Insufficient documentation

## 2016-04-19 DIAGNOSIS — F0390 Unspecified dementia without behavioral disturbance: Secondary | ICD-10-CM | POA: Diagnosis not present

## 2016-04-19 DIAGNOSIS — G479 Sleep disorder, unspecified: Secondary | ICD-10-CM | POA: Diagnosis not present

## 2016-04-19 DIAGNOSIS — R200 Anesthesia of skin: Secondary | ICD-10-CM | POA: Diagnosis not present

## 2016-04-19 DIAGNOSIS — M542 Cervicalgia: Secondary | ICD-10-CM | POA: Diagnosis not present

## 2016-04-19 DIAGNOSIS — F419 Anxiety disorder, unspecified: Secondary | ICD-10-CM | POA: Diagnosis not present

## 2016-05-03 DIAGNOSIS — F419 Anxiety disorder, unspecified: Secondary | ICD-10-CM | POA: Insufficient documentation

## 2016-05-03 DIAGNOSIS — F32A Depression, unspecified: Secondary | ICD-10-CM | POA: Insufficient documentation

## 2016-05-03 DIAGNOSIS — F329 Major depressive disorder, single episode, unspecified: Secondary | ICD-10-CM | POA: Insufficient documentation

## 2016-05-18 DIAGNOSIS — H2512 Age-related nuclear cataract, left eye: Secondary | ICD-10-CM | POA: Diagnosis not present

## 2016-05-19 ENCOUNTER — Encounter: Payer: Self-pay | Admitting: *Deleted

## 2016-05-24 ENCOUNTER — Encounter
Admission: RE | Disposition: A | Discharge status: Home / Self Care | Payer: Self-pay | Source: Ambulatory Visit | Attending: Ophthalmology

## 2016-05-24 ENCOUNTER — Encounter: Payer: Self-pay | Admitting: *Deleted

## 2016-05-24 ENCOUNTER — Ambulatory Visit
Admission: RE | Admit: 2016-05-24 | Discharge: 2016-05-24 | Disposition: A | Discharge status: Home / Self Care | Payer: PPO | Source: Ambulatory Visit | Attending: Ophthalmology | Admitting: Ophthalmology

## 2016-05-24 ENCOUNTER — Ambulatory Visit: Payer: PPO | Admitting: Anesthesiology

## 2016-05-24 DIAGNOSIS — J449 Chronic obstructive pulmonary disease, unspecified: Secondary | ICD-10-CM | POA: Insufficient documentation

## 2016-05-24 DIAGNOSIS — F419 Anxiety disorder, unspecified: Secondary | ICD-10-CM | POA: Insufficient documentation

## 2016-05-24 DIAGNOSIS — F329 Major depressive disorder, single episode, unspecified: Secondary | ICD-10-CM | POA: Insufficient documentation

## 2016-05-24 DIAGNOSIS — M1990 Unspecified osteoarthritis, unspecified site: Secondary | ICD-10-CM | POA: Insufficient documentation

## 2016-05-24 DIAGNOSIS — Z955 Presence of coronary angioplasty implant and graft: Secondary | ICD-10-CM | POA: Insufficient documentation

## 2016-05-24 DIAGNOSIS — Z79899 Other long term (current) drug therapy: Secondary | ICD-10-CM | POA: Diagnosis not present

## 2016-05-24 DIAGNOSIS — I2510 Atherosclerotic heart disease of native coronary artery without angina pectoris: Secondary | ICD-10-CM | POA: Diagnosis not present

## 2016-05-24 DIAGNOSIS — H2512 Age-related nuclear cataract, left eye: Secondary | ICD-10-CM | POA: Insufficient documentation

## 2016-05-24 DIAGNOSIS — M810 Age-related osteoporosis without current pathological fracture: Secondary | ICD-10-CM | POA: Diagnosis not present

## 2016-05-24 DIAGNOSIS — Z87891 Personal history of nicotine dependence: Secondary | ICD-10-CM | POA: Insufficient documentation

## 2016-05-24 DIAGNOSIS — E7800 Pure hypercholesterolemia, unspecified: Secondary | ICD-10-CM | POA: Insufficient documentation

## 2016-05-24 DIAGNOSIS — K219 Gastro-esophageal reflux disease without esophagitis: Secondary | ICD-10-CM | POA: Diagnosis not present

## 2016-05-24 HISTORY — DX: Edema, unspecified: R60.9

## 2016-05-24 HISTORY — DX: Adverse effect of unspecified anesthetic, initial encounter: T41.45XA

## 2016-05-24 HISTORY — DX: Major depressive disorder, single episode, unspecified: F32.9

## 2016-05-24 HISTORY — PX: CATARACT EXTRACTION W/PHACO: SHX586

## 2016-05-24 HISTORY — DX: Other complications of anesthesia, initial encounter: T88.59XA

## 2016-05-24 HISTORY — DX: Unspecified osteoarthritis, unspecified site: M19.90

## 2016-05-24 HISTORY — DX: Chronic obstructive pulmonary disease, unspecified: J44.9

## 2016-05-24 HISTORY — DX: Depression, unspecified: F32.A

## 2016-05-24 HISTORY — DX: Atherosclerotic heart disease of native coronary artery without angina pectoris: I25.10

## 2016-05-24 HISTORY — DX: Other specified postprocedural states: Z98.890

## 2016-05-24 HISTORY — DX: Other specified postprocedural states: R11.2

## 2016-05-24 SURGERY — PHACOEMULSIFICATION, CATARACT, WITH IOL INSERTION
Anesthesia: Monitor Anesthesia Care | Site: Eye | Laterality: Left | Wound class: Clean

## 2016-05-24 MED ORDER — MOXIFLOXACIN HCL 0.5 % OP SOLN
OPHTHALMIC | Status: AC
  Filled 2016-05-24: qty 3

## 2016-05-24 MED ORDER — EPINEPHRINE PF 1 MG/ML IJ SOLN
INTRAMUSCULAR | Status: AC
  Filled 2016-05-24: qty 2

## 2016-05-24 MED ORDER — MOXIFLOXACIN HCL 0.5 % OP SOLN
OPHTHALMIC | Status: DC | PRN
Start: 2016-05-24 — End: 2016-05-24
  Administered 2016-05-24: 9 [drp] via OPHTHALMIC

## 2016-05-24 MED ORDER — ARMC OPHTHALMIC DILATING DROPS
1.0000 "application " | OPHTHALMIC | Status: AC
  Administered 2016-05-24 (×3): 1 via OPHTHALMIC

## 2016-05-24 MED ORDER — ONDANSETRON HCL 4 MG/2ML IJ SOLN
INTRAMUSCULAR | Status: DC | PRN
  Administered 2016-05-24: 4 mg via INTRAVENOUS

## 2016-05-24 MED ORDER — NA CHONDROIT SULF-NA HYALURON 40-17 MG/ML IO SOLN
INTRAOCULAR | Status: AC
  Filled 2016-05-24: qty 1

## 2016-05-24 MED ORDER — LIDOCAINE HCL (PF) 4 % IJ SOLN
INTRAMUSCULAR | Status: AC
  Filled 2016-05-24: qty 5

## 2016-05-24 MED ORDER — ARMC OPHTHALMIC DILATING DROPS
OPHTHALMIC | Status: AC
  Filled 2016-05-24: qty 0.4

## 2016-05-24 MED ORDER — EPINEPHRINE PF 1 MG/ML IJ SOLN
INTRAMUSCULAR | Status: DC | PRN
  Administered 2016-05-24: 250 mL via OPHTHALMIC

## 2016-05-24 MED ORDER — CARBACHOL 0.01 % IO SOLN
INTRAOCULAR | Status: DC | PRN
  Administered 2016-05-24: 0.5 mL via INTRAOCULAR

## 2016-05-24 MED ORDER — LIDOCAINE HCL (PF) 4 % IJ SOLN
INTRAMUSCULAR | Status: DC | PRN
  Administered 2016-05-24: 4 mL via OPHTHALMIC

## 2016-05-24 MED ORDER — SODIUM CHLORIDE 0.9 % IV SOLN
INTRAVENOUS | Status: DC
  Administered 2016-05-24: 08:00:00 via INTRAVENOUS

## 2016-05-24 MED ORDER — MIDAZOLAM HCL 2 MG/2ML IJ SOLN
INTRAMUSCULAR | Status: DC | PRN
  Administered 2016-05-24: 1 mg via INTRAVENOUS

## 2016-05-24 MED ORDER — NA CHONDROIT SULF-NA HYALURON 40-17 MG/ML IO SOLN
INTRAOCULAR | Status: DC | PRN
Start: 2016-05-24 — End: 2016-05-24
  Administered 2016-05-24: 1 mL via INTRAOCULAR

## 2016-05-24 MED ORDER — POVIDONE-IODINE 5 % OP SOLN
OPHTHALMIC | Status: AC
  Filled 2016-05-24: qty 30

## 2016-05-24 MED ORDER — MOXIFLOXACIN HCL 0.5 % OP SOLN
1.0000 [drp] | OPHTHALMIC | Status: DC
  Administered 2016-05-24 (×3): 1 [drp] via OPHTHALMIC

## 2016-05-24 SURGICAL SUPPLY — 22 items
CANNULA ANT/CHMB 27G (MISCELLANEOUS) ×1 IMPLANT
CANNULA ANT/CHMB 27GA (MISCELLANEOUS) ×3 IMPLANT
CUP MEDICINE 2OZ PLAST GRAD ST (MISCELLANEOUS) ×3 IMPLANT
GLOVE BIO SURGEON STRL SZ8 (GLOVE) ×3 IMPLANT
GLOVE BIOGEL M 6.5 STRL (GLOVE) ×3 IMPLANT
GLOVE SURG LX 8.0 MICRO (GLOVE) ×2
GLOVE SURG LX STRL 8.0 MICRO (GLOVE) ×1 IMPLANT
GOWN STRL REUS W/ TWL LRG LVL3 (GOWN DISPOSABLE) ×2 IMPLANT
GOWN STRL REUS W/TWL LRG LVL3 (GOWN DISPOSABLE) ×6
LENS IOL TECNIS ITEC 22.0 (Intraocular Lens) ×2 IMPLANT
PACK CATARACT (MISCELLANEOUS) ×3 IMPLANT
PACK CATARACT BRASINGTON LX (MISCELLANEOUS) ×3 IMPLANT
PACK EYE AFTER SURG (MISCELLANEOUS) ×3 IMPLANT
SOL BSS BAG (MISCELLANEOUS) ×3
SOL PREP PVP 2OZ (MISCELLANEOUS) ×3
SOLUTION BSS BAG (MISCELLANEOUS) ×1 IMPLANT
SOLUTION PREP PVP 2OZ (MISCELLANEOUS) ×1 IMPLANT
SYR 3ML LL SCALE MARK (SYRINGE) ×3 IMPLANT
SYR 5ML LL (SYRINGE) ×3 IMPLANT
SYR TB 1ML 27GX1/2 LL (SYRINGE) ×3 IMPLANT
WATER STERILE IRR 250ML POUR (IV SOLUTION) ×3 IMPLANT
WIPE NON LINTING 3.25X3.25 (MISCELLANEOUS) ×3 IMPLANT

## 2016-05-24 NOTE — Op Note (Signed)
PREOPERATIVE DIAGNOSIS:  Nuclear sclerotic cataract of the left eye.   POSTOPERATIVE DIAGNOSIS:  Nuclear sclerotic cataract of the left eye.   OPERATIVE PROCEDURE: Procedure(s): CATARACT EXTRACTION PHACO AND INTRAOCULAR LENS PLACEMENT (IOC)   SURGEON:  Birder Robson, MD.   ANESTHESIA:  Anesthesiologist: Martha Clan, MD CRNA: Demetrius Charity, CRNA; Doreen Salvage, CRNA  1.      Managed anesthesia care. 2.     0.72m of Shugarcaine was instilled following the paracentesis   COMPLICATIONS:  None.   TECHNIQUE:   Stop and chop   DESCRIPTION OF PROCEDURE:  The patient was examined and consented in the preoperative holding area where the aforementioned topical anesthesia was applied to the left eye and then brought back to the Operating Room where the left eye was prepped and draped in the usual sterile ophthalmic fashion and a lid speculum was placed. A paracentesis was created with the side port blade and the anterior chamber was filled with viscoelastic. A near clear corneal incision was performed with the steel keratome. A continuous curvilinear capsulorrhexis was performed with a cystotome followed by the capsulorrhexis forceps. Hydrodissection and hydrodelineation were carried out with BSS on a blunt cannula. The lens was removed in a stop and chop  technique and the remaining cortical material was removed with the irrigation-aspiration handpiece. The capsular bag was inflated with viscoelastic and the Technis ZCB00 lens was placed in the capsular bag without complication. The remaining viscoelastic was removed from the eye with the irrigation-aspiration handpiece. The wounds were hydrated. The anterior chamber was flushed with Miostat and the eye was inflated to physiologic pressure. 0.113mVigamox was placed in the anterior chamber. The wounds were found to be water tight. The eye was dressed with Vigamox. The patient was given protective glasses to wear throughout the day and a shield with  which to sleep tonight. The patient was also given drops with which to begin a drop regimen today and will follow-up with me in one day.  Implant Name Type Inv. Item Serial No. Manufacturer Lot No. LRB No. Used  LENS IOL DIOP 22.0 - [id number] Intraocular Lens LENS IOL DIOP 22.0 [id number]   Left 1    Procedure(s) with comments: CATARACT EXTRACTION PHACO AND INTRAOCULAR LENS PLACEMENT (I[facility]) (Left) - Lot# [id number] [place]  00:52.3 AP%:   27.3 CDE:  14.26   Electronically signed: [place]0/17/2017 8:51 AM

## 2016-05-24 NOTE — Transfer of Care (Signed)
Immediate Anesthesia Transfer of Care Note  Patient: Stephanie [name]  Procedure(s) Performed: Procedure(s) with comments: CATARACT EXTRACTION PHACO AND INTRAOCULAR LENS PLACEMENT ([place]) (Left) - Lot# [id number] H [place]:   00:52.3 AP%:   27.3 CDE:  14.26   Patient Location: PACU  Anesthesia Type:MAC  Level of Consciousness: awake, alert  and oriented  Airway & Oxygen Therapy: Patient Spontanous Breathing  Post-op Assessment: Report given to RN and Post -op Vital signs reviewed and stable  Post vital signs: Reviewed and stable  Last Vitals:  Vitals:   05/24/16 0728  BP: 138/64  Pulse: 66  Resp: 16  Temp: 36.7 C    Last Pain:  Vitals:   05/24/16 0728  TempSrc: Oral         Complications: No apparent anesthesia complications

## 2016-05-24 NOTE — H&P (Signed)
All labs reviewed. Abnormal studies sent to patients PCP when indicated.  Previous H&P reviewed, patient examined, there are NO CHANGES.  Stephanie [name] LOUIS10/17/20178:25 AM

## 2016-05-24 NOTE — Anesthesia Procedure Notes (Signed)
Procedure Name: MAC Date/Time: 05/24/2016 8:34 AM Performed by: Doreen Salvage Pre-anesthesia Checklist: Patient identified, Emergency Drugs available, Suction available and Patient being monitored Patient Re-evaluated:Patient Re-evaluated prior to inductionOxygen Delivery Method: Nasal cannula

## 2016-05-24 NOTE — Anesthesia Preprocedure Evaluation (Signed)
Anesthesia Evaluation  Patient identified by MRN, date of birth, ID band Patient awake    Reviewed: Allergy & Precautions, H&P , NPO status , Patient's Chart, lab work & pertinent test results, reviewed documented beta blocker date and time   History of Anesthesia Complications (+) PONV and history of anesthetic complications  Airway Mallampati: II  TM Distance: >3 FB Neck ROM: full    Dental no notable dental hx. (+) Teeth Intact   Pulmonary shortness of breath and with exertion, neg sleep apnea, COPD,  COPD inhaler, neg recent URI, former smoker,           Cardiovascular Exercise Tolerance: Good (-) hypertension(-) angina+ CAD and + Cardiac Stents  (-) Past MI and (-) CABG (-) dysrhythmias (-) Valvular Problems/Murmurs     Neuro/Psych PSYCHIATRIC DISORDERS (Depression and anxiety) negative neurological ROS     GI/Hepatic Neg liver ROS, PUD, neg GERD  ,  Endo/Other  negative endocrine ROS  Renal/GU negative Renal ROS  negative genitourinary   Musculoskeletal   Abdominal   Peds  Hematology negative hematology ROS (+)   Anesthesia Other Findings Past Medical History: No date: Arthritis No date: Complication of anesthesia No date: COPD (chronic obstructive pulmonary disease) (* No date: Coronary artery disease No date: Depression No date: Dyspnea No date: Edema No date: Pneumonia     Comment: IN PAST No date: PONV (postoperative nausea and vomiting) No date: Wheezing   Reproductive/Obstetrics negative OB ROS                             Anesthesia Physical Anesthesia Plan  ASA: II  Anesthesia Plan: MAC   Post-op Pain Management:    Induction:   Airway Management Planned:   Additional Equipment:   Intra-op Plan:   Post-operative Plan:   Informed Consent: I have reviewed the patients History and Physical, chart, labs and discussed the procedure including the risks,  benefits and alternatives for the proposed anesthesia with the patient or authorized representative who has indicated his/her understanding and acceptance.   Dental Advisory Given  Plan Discussed with: Anesthesiologist, CRNA and Surgeon  Anesthesia Plan Comments:         Anesthesia Quick Evaluation

## 2016-05-24 NOTE — Anesthesia Postprocedure Evaluation (Signed)
Anesthesia Post Note  Patient: Stephanie [name]  Procedure(s) Performed: Procedure(s) (LRB): CATARACT EXTRACTION PHACO AND INTRAOCULAR LENS PLACEMENT ([facility]) (Left)  Patient location during evaluation: PACU Anesthesia Type: MAC Level of consciousness: awake and alert and oriented Pain management: satisfactory to patient Vital Signs Assessment: post-procedure vital signs reviewed and stable Respiratory status: spontaneous breathing Cardiovascular status: blood pressure returned to baseline Anesthetic complications: no    Last Vitals:  Vitals:   05/24/16 0728  BP: 138/64  Pulse: 66  Resp: 16  Temp: 36.7 C    Last Pain:  Vitals:   05/24/16 0728  TempSrc: Oral                 Blima Singer

## 2016-05-24 NOTE — Discharge Instructions (Signed)
Eye Surgery Discharge Instructions  Expect mild scratchy sensation or mild soreness. DO NOT RUB YOUR EYE!  The day of surgery:  Minimal physical activity, but bed rest is not required  No reading, computer work, or close hand work  No bending, lifting, or straining.  May watch TV  For 24 hours:  No driving, legal decisions, or alcoholic beverages  Safety precautions  Eat anything you prefer: It is better to start with liquids, then soup then solid foods.  _____ Eye patch should be worn until postoperative exam tomorrow.  ____ Solar shield eyeglasses should be worn for comfort in the sunlight/patch while sleeping  Resume all regular medications including aspirin or Coumadin if these were discontinued prior to surgery. You may shower, bathe, shave, or wash your hair. Tylenol may be taken for mild discomfort.  Call your doctor if you experience significant pain, nausea, or vomiting, fever > 101 or other signs of infection. [phone] or [phone] Specific instructions:  Follow-up Information    Stephanie [name],Stephanie LOUIS, MD Follow up on 05/25/2016.   Specialty:  Ophthalmology Why:  8:30 Contact information: [address] [place] [address] [phone]

## 2016-06-06 DIAGNOSIS — H2511 Age-related nuclear cataract, right eye: Secondary | ICD-10-CM | POA: Diagnosis not present

## 2016-06-08 ENCOUNTER — Encounter: Payer: Self-pay | Admitting: *Deleted

## 2016-06-14 ENCOUNTER — Encounter
Admission: RE | Disposition: A | Discharge status: Home / Self Care | Payer: Self-pay | Source: Ambulatory Visit | Attending: Ophthalmology

## 2016-06-14 ENCOUNTER — Ambulatory Visit: Payer: PPO | Admitting: Anesthesiology

## 2016-06-14 ENCOUNTER — Encounter: Payer: Self-pay | Admitting: *Deleted

## 2016-06-14 ENCOUNTER — Ambulatory Visit
Admission: RE | Admit: 2016-06-14 | Discharge: 2016-06-14 | Disposition: A | Discharge status: Home / Self Care | Payer: PPO | Source: Ambulatory Visit | Attending: Ophthalmology | Admitting: Ophthalmology

## 2016-06-14 DIAGNOSIS — Z87891 Personal history of nicotine dependence: Secondary | ICD-10-CM | POA: Insufficient documentation

## 2016-06-14 DIAGNOSIS — I2510 Atherosclerotic heart disease of native coronary artery without angina pectoris: Secondary | ICD-10-CM | POA: Insufficient documentation

## 2016-06-14 DIAGNOSIS — F329 Major depressive disorder, single episode, unspecified: Secondary | ICD-10-CM | POA: Diagnosis not present

## 2016-06-14 DIAGNOSIS — F419 Anxiety disorder, unspecified: Secondary | ICD-10-CM | POA: Insufficient documentation

## 2016-06-14 DIAGNOSIS — J449 Chronic obstructive pulmonary disease, unspecified: Secondary | ICD-10-CM | POA: Diagnosis not present

## 2016-06-14 DIAGNOSIS — Z79899 Other long term (current) drug therapy: Secondary | ICD-10-CM | POA: Insufficient documentation

## 2016-06-14 DIAGNOSIS — Z955 Presence of coronary angioplasty implant and graft: Secondary | ICD-10-CM | POA: Diagnosis not present

## 2016-06-14 DIAGNOSIS — M1990 Unspecified osteoarthritis, unspecified site: Secondary | ICD-10-CM | POA: Insufficient documentation

## 2016-06-14 DIAGNOSIS — H2511 Age-related nuclear cataract, right eye: Secondary | ICD-10-CM | POA: Insufficient documentation

## 2016-06-14 HISTORY — PX: CATARACT EXTRACTION W/PHACO: SHX586

## 2016-06-14 SURGERY — PHACOEMULSIFICATION, CATARACT, WITH IOL INSERTION
Anesthesia: Monitor Anesthesia Care | Site: Eye | Laterality: Right | Wound class: Clean

## 2016-06-14 MED ORDER — CARBACHOL 0.01 % IO SOLN
INTRAOCULAR | Status: DC | PRN
  Administered 2016-06-14: 0.5 mL via INTRAOCULAR

## 2016-06-14 MED ORDER — SODIUM CHLORIDE 0.9 % IV SOLN
INTRAVENOUS | Status: DC
  Administered 2016-06-14: 09:00:00 via INTRAVENOUS

## 2016-06-14 MED ORDER — FENTANYL CITRATE (PF) 100 MCG/2ML IJ SOLN
INTRAMUSCULAR | Status: DC | PRN
  Administered 2016-06-14: 50 ug via INTRAVENOUS

## 2016-06-14 MED ORDER — POVIDONE-IODINE 5 % OP SOLN
OPHTHALMIC | Status: AC
  Filled 2016-06-14: qty 30

## 2016-06-14 MED ORDER — MOXIFLOXACIN HCL 0.5 % OP SOLN
OPHTHALMIC | Status: DC | PRN
  Administered 2016-06-14: 9 [drp] via OPHTHALMIC

## 2016-06-14 MED ORDER — NA CHONDROIT SULF-NA HYALURON 40-17 MG/ML IO SOLN
INTRAOCULAR | Status: DC | PRN
  Administered 2016-06-14: 1 mL via INTRAOCULAR

## 2016-06-14 MED ORDER — MOXIFLOXACIN HCL 0.5 % OP SOLN
1.0000 [drp] | OPHTHALMIC | Status: AC
  Administered 2016-06-14 (×3): 1 [drp] via OPHTHALMIC

## 2016-06-14 MED ORDER — NA CHONDROIT SULF-NA HYALURON 40-17 MG/ML IO SOLN
INTRAOCULAR | Status: AC
Start: 2016-06-14 — End: 2016-06-14
  Filled 2016-06-14: qty 1

## 2016-06-14 MED ORDER — BSS IO SOLN
INTRAOCULAR | Status: DC | PRN
  Administered 2016-06-14: 200 mL via OPHTHALMIC

## 2016-06-14 MED ORDER — LIDOCAINE HCL (PF) 4 % IJ SOLN
INTRAOCULAR | Status: DC | PRN
  Administered 2016-06-14: 4 mL via OPHTHALMIC

## 2016-06-14 MED ORDER — MOXIFLOXACIN HCL 0.5 % OP SOLN
OPHTHALMIC | Status: AC
  Filled 2016-06-14: qty 3

## 2016-06-14 MED ORDER — ARMC OPHTHALMIC DILATING DROPS: 1.0000 "application " | OPHTHALMIC | Status: AC

## 2016-06-14 MED ORDER — MOXIFLOXACIN HCL 0.5 % OP SOLN: 1.0000 [drp] | OPHTHALMIC | Status: AC

## 2016-06-14 MED ORDER — MOXIFLOXACIN HCL 0.5 % OP SOLN: 1.0000 [drp] | OPHTHALMIC | Status: DC

## 2016-06-14 MED ORDER — EPINEPHRINE PF 1 MG/ML IJ SOLN
INTRAMUSCULAR | Status: AC
Start: 2016-06-14 — End: 2016-06-14
  Filled 2016-06-14: qty 2

## 2016-06-14 MED ORDER — ARMC OPHTHALMIC DILATING DROPS
1.0000 "application " | OPHTHALMIC | Status: AC
  Administered 2016-06-14 (×3): 1 via OPHTHALMIC

## 2016-06-14 MED ORDER — ARMC OPHTHALMIC DILATING DROPS
OPHTHALMIC | Status: AC
Start: 2016-06-14 — End: 2016-06-14
  Administered 2016-06-14: 09:00:00 via OPHTHALMIC
  Filled 2016-06-14: qty 0.4

## 2016-06-14 MED ORDER — MIDAZOLAM HCL 2 MG/2ML IJ SOLN
INTRAMUSCULAR | Status: DC | PRN
  Administered 2016-06-14: 1 mg via INTRAVENOUS

## 2016-06-14 MED ORDER — LIDOCAINE HCL (PF) 4 % IJ SOLN
INTRAMUSCULAR | Status: AC
  Filled 2016-06-14: qty 5

## 2016-06-14 SURGICAL SUPPLY — 22 items
CANNULA ANT/CHMB 27G (MISCELLANEOUS) ×1 IMPLANT
CANNULA ANT/CHMB 27GA (MISCELLANEOUS) ×3 IMPLANT
CUP MEDICINE 2OZ PLAST GRAD ST (MISCELLANEOUS) ×3 IMPLANT
GLOVE BIO SURGEON STRL SZ8 (GLOVE) ×3 IMPLANT
GLOVE BIOGEL M 6.5 STRL (GLOVE) ×3 IMPLANT
GLOVE SURG LX 8.0 MICRO (GLOVE) ×2
GLOVE SURG LX STRL 8.0 MICRO (GLOVE) ×1 IMPLANT
GOWN STRL REUS W/ TWL LRG LVL3 (GOWN DISPOSABLE) ×2 IMPLANT
GOWN STRL REUS W/TWL LRG LVL3 (GOWN DISPOSABLE) ×6
LENS IOL TECNIS ITEC 22.0 (Intraocular Lens) ×2 IMPLANT
PACK CATARACT (MISCELLANEOUS) ×3 IMPLANT
PACK CATARACT BRASINGTON LX (MISCELLANEOUS) ×3 IMPLANT
PACK EYE AFTER SURG (MISCELLANEOUS) ×3 IMPLANT
SOL BSS BAG (MISCELLANEOUS) ×3
SOL PREP PVP 2OZ (MISCELLANEOUS) ×3
SOLUTION BSS BAG (MISCELLANEOUS) ×1 IMPLANT
SOLUTION PREP PVP 2OZ (MISCELLANEOUS) ×1 IMPLANT
SYR 3ML LL SCALE MARK (SYRINGE) ×3 IMPLANT
SYR 5ML LL (SYRINGE) ×3 IMPLANT
SYR TB 1ML 27GX1/2 LL (SYRINGE) ×3 IMPLANT
WATER STERILE IRR 250ML POUR (IV SOLUTION) ×3 IMPLANT
WIPE NON LINTING 3.25X3.25 (MISCELLANEOUS) ×3 IMPLANT

## 2016-06-14 NOTE — Discharge Instructions (Signed)
Eye Surgery Discharge Instructions  Expect mild scratchy sensation or mild soreness. DO NOT RUB YOUR EYE!  The day of surgery:  Minimal physical activity, but bed rest is not required  No reading, computer work, or close hand work  No bending, lifting, or straining.  May watch TV  For 24 hours:  No driving, legal decisions, or alcoholic beverages  Safety precautions  Eat anything you prefer: It is better to start with liquids, then soup then solid foods.  _____ Eye patch should be worn until postoperative exam tomorrow.  ____ Solar shield eyeglasses should be worn for comfort in the sunlight/patch while sleeping  Resume all regular medications including aspirin or Coumadin if these were discontinued prior to surgery. You may shower, bathe, shave, or wash your hair. Tylenol may be taken for mild discomfort.  Call your doctor if you experience significant pain, nausea, or vomiting, fever > 101 or other signs of infection. [phone] or [phone] Specific instructions:  Follow-up Information    PORFILIO,WILLIAM LOUIS, MD Follow up on 06/15/2016.   Specialty:  Ophthalmology Why:  10:10 Contact information: [address] [place] [address] [phone]

## 2016-06-14 NOTE — Anesthesia Preprocedure Evaluation (Signed)
Anesthesia Evaluation  Patient identified by MRN, date of birth, ID band Patient awake    Reviewed: Allergy & Precautions, H&P , NPO status , Patient's Chart, lab work & pertinent test results, reviewed documented beta blocker date and time   History of Anesthesia Complications (+) PONV and history of anesthetic complications  Airway Mallampati: II  TM Distance: >3 FB Neck ROM: full    Dental no notable dental hx. (+) Teeth Intact   Pulmonary shortness of breath and with exertion, neg sleep apnea, COPD,  COPD inhaler, neg recent URI, former smoker,    breath sounds clear to auscultation- rhonchi (-) wheezing      Cardiovascular Exercise Tolerance: Good (-) hypertension(-) angina+ CAD and + Cardiac Stents (2001)  (-) Past MI and (-) CABG (-) dysrhythmias (-) Valvular Problems/Murmurs Rhythm:Regular Rate:Normal - Systolic murmurs and - Diastolic murmurs    Neuro/Psych PSYCHIATRIC DISORDERS (Depression and anxiety) Depression negative neurological ROS     GI/Hepatic Neg liver ROS, PUD, neg GERD  ,  Endo/Other  negative endocrine ROSneg diabetes  Renal/GU negative Renal ROS  negative genitourinary   Musculoskeletal  (+) Arthritis ,   Abdominal (+) - obese,   Peds  Hematology negative hematology ROS (+)   Anesthesia Other Findings Past Medical History: No date: Arthritis No date: Complication of anesthesia No date: COPD (chronic obstructive pulmonary disease) (* No date: Coronary artery disease No date: Depression No date: Dyspnea No date: Edema No date: Pneumonia     Comment: IN PAST No date: PONV (postoperative nausea and vomiting) No date: Wheezing   Reproductive/Obstetrics negative OB ROS                             Anesthesia Physical  Anesthesia Plan  ASA: II  Anesthesia Plan: MAC   Post-op Pain Management:    Induction: Intravenous  Airway Management Planned: Natural  Airway  Additional Equipment:   Intra-op Plan:   Post-operative Plan:   Informed Consent: I have reviewed the patients History and Physical, chart, labs and discussed the procedure including the risks, benefits and alternatives for the proposed anesthesia with the patient or authorized representative who has indicated his/her understanding and acceptance.   Dental Advisory Given  Plan Discussed with: Anesthesiologist, CRNA and Surgeon  Anesthesia Plan Comments:         Anesthesia Quick Evaluation

## 2016-06-14 NOTE — Anesthesia Postprocedure Evaluation (Signed)
Anesthesia Post Note  Patient: Stephanie [name]  Procedure(s) Performed: Procedure(s) (LRB): CATARACT EXTRACTION PHACO AND INTRAOCULAR LENS PLACEMENT ([facility]) (Right)  Patient location during evaluation: PACU Anesthesia Type: MAC Level of consciousness: awake, awake and alert and oriented Pain management: pain level controlled Vital Signs Assessment: post-procedure vital signs reviewed and stable Respiratory status: spontaneous breathing, nonlabored ventilation and respiratory function stable Cardiovascular status: blood pressure returned to baseline and stable Postop Assessment: no signs of nausea or vomiting Anesthetic complications: no    Last Vitals:  Vitals:   06/14/16 0857 06/14/16 1048  BP: (!) 154/74 (!) 170/78  Pulse: 85 74  Resp: 16 16  Temp: (!) 36.1 C     Last Pain:  Vitals:   06/14/16 1048  TempSrc: Tympanic                 Darlyne Russian

## 2016-06-14 NOTE — Transfer of Care (Signed)
Immediate Anesthesia Transfer of Care Note  Patient: Stephanie [name]  Procedure(s) Performed: Procedure(s) with comments: CATARACT EXTRACTION PHACO AND INTRAOCULAR LENS PLACEMENT ([facility]) (Right) - Lot# [id number] H [place]: 01:13.4 AP%: 24.4 CDE: 17.85  Patient Location: PACU  Anesthesia Type:MAC  Level of Consciousness: awake, alert  and oriented  Airway & Oxygen Therapy: Patient Spontanous Breathing  Post-op Assessment: Report given to RN and Post -op Vital signs reviewed and stable  Post vital signs: Reviewed and stable  Last Vitals:  Vitals:   06/14/16 0857 06/14/16 1048  BP: (!) 154/74 (!) 170/78  Pulse: 85 74  Resp: 16 16  Temp: (!) 36.1 C     Last Pain:  Vitals:   06/14/16 1048  TempSrc: Tympanic         Complications: No apparent anesthesia complications

## 2016-06-14 NOTE — H&P (Signed)
All labs reviewed. Abnormal studies sent to patients PCP when indicated.  Previous H&P reviewed, patient examined, there are NO CHANGES.  Zinedine Ellner LOUIS11/7/201710:20 AM

## 2016-06-14 NOTE — Op Note (Signed)
PREOPERATIVE DIAGNOSIS:  Nuclear sclerotic cataract of the right eye.   POSTOPERATIVE DIAGNOSIS:  right nuclear sclerotic CATARACT   OPERATIVE PROCEDURE: Procedure(s): CATARACT EXTRACTION PHACO AND INTRAOCULAR LENS PLACEMENT ([facility])   SURGEON:  Birder Robson, MD.   ANESTHESIA:  Anesthesiologist: Emmie Niemann, MD CRNA: Darlyne Russian, CRNA  1.      Managed anesthesia care. 2.      0.64m of Shugarcaine was instilled in the eye following the paracentesis.   COMPLICATIONS:  None.   TECHNIQUE:   Stop and chop   DESCRIPTION OF PROCEDURE:  The patient was examined and consented in the preoperative holding area where the aforementioned topical anesthesia was applied to the right eye and then brought back to the Operating Room where the right eye was prepped and draped in the usual sterile ophthalmic fashion and a lid speculum was placed. A paracentesis was created with the side port blade and the anterior chamber was filled with viscoelastic. A near clear corneal incision was performed with the steel keratome. A continuous curvilinear capsulorrhexis was performed with a cystotome followed by the capsulorrhexis forceps. Hydrodissection and hydrodelineation were carried out with BSS on a blunt cannula. The lens was removed in a stop and chop  technique and the remaining cortical material was removed with the irrigation-aspiration handpiece. The capsular bag was inflated with viscoelastic and the Technis ZCB00  lens was placed in the capsular bag without complication. The remaining viscoelastic was removed from the eye with the irrigation-aspiration handpiece. The wounds were hydrated. The anterior chamber was flushed with Miostat and the eye was inflated to physiologic pressure. 0.121mof Vigamox was placed in the anterior chamber. The wounds were found to be water tight. The eye was dressed with Vigamox. The patient was given protective glasses to wear throughout the day and a shield with which to sleep  tonight. The patient was also given drops with which to begin a drop regimen today and will follow-up with me in one day.  Implant Name Type Inv. Item Serial No. Manufacturer Lot No. LRB No. Used  LENS IOL DIOP 22.0 - [id number] Intraocular Lens LENS IOL DIOP 22.0 [id number]   Right 1   Procedure(s) with comments: CATARACT EXTRACTION PHACO AND INTRAOCULAR LENS PLACEMENT ([facility]) (Right) - Lot# [id number] [place]:13.4 AP%: 24.4 CDE: 17.85  Electronically signed: [place]1/02/2016 10:45 AM

## 2016-06-27 ENCOUNTER — Telehealth: Payer: Self-pay | Admitting: Family Medicine

## 2016-06-27 NOTE — Telephone Encounter (Signed)
Pt called wanting to know if you had anymore samples of the :  albuterol (VENTOLIN HFA) 108 (90 Base) MCG/ACT inhaler  budesonide-formoterol (SYMBICORT) 160-4.5  Her call back is [phone]  Thanks teri.

## 2016-06-27 NOTE — Telephone Encounter (Signed)
Only have Symbicort samples. Per Simona Huh okay for patient to have sample, but needs to follow up. Patient has a follow up scheduled for Monday.

## 2016-07-04 ENCOUNTER — Ambulatory Visit (INDEPENDENT_AMBULATORY_CARE_PROVIDER_SITE_OTHER): Payer: PPO | Admitting: Family Medicine

## 2016-07-04 ENCOUNTER — Encounter: Payer: Self-pay | Admitting: Family Medicine

## 2016-07-04 VITALS — BP 118/78 | HR 72 | Temp 98.0°F | Resp 16 | Ht 60.5 in | Wt 127.4 lb

## 2016-07-04 DIAGNOSIS — J449 Chronic obstructive pulmonary disease, unspecified: Secondary | ICD-10-CM | POA: Diagnosis not present

## 2016-07-04 DIAGNOSIS — Z0000 Encounter for general adult medical examination without abnormal findings: Secondary | ICD-10-CM

## 2016-07-04 DIAGNOSIS — F418 Other specified anxiety disorders: Secondary | ICD-10-CM | POA: Diagnosis not present

## 2016-07-04 DIAGNOSIS — F329 Major depressive disorder, single episode, unspecified: Secondary | ICD-10-CM

## 2016-07-04 DIAGNOSIS — F0390 Unspecified dementia without behavioral disturbance: Secondary | ICD-10-CM

## 2016-07-04 DIAGNOSIS — F03A0 Unspecified dementia, mild, without behavioral disturbance, psychotic disturbance, mood disturbance, and anxiety: Secondary | ICD-10-CM

## 2016-07-04 DIAGNOSIS — F419 Anxiety disorder, unspecified: Secondary | ICD-10-CM

## 2016-07-04 DIAGNOSIS — E782 Mixed hyperlipidemia: Secondary | ICD-10-CM | POA: Diagnosis not present

## 2016-07-04 NOTE — Progress Notes (Signed)
Patient: Stephanie [name], Female    DOB: [birth year], 80 y.o.   MRN: [id number] Visit Date: 07/04/2016  Today's Provider: Vernie Murders, PA   Chief Complaint  Patient presents with  . Medicare Wellness   Subjective:    Annual wellness visit Stephanie [name] is a 80 y.o. female who presents today for her Subsequent Annual Wellness Visit. She feels well. She reports exercising by walking up and down stairs at home. She reports she is sleeping average 7-8 hours per night.  -----------------------------------------------------------   Review of Systems  Constitutional: Negative.   HENT: Negative.   Eyes: Negative.   Respiratory: Negative.   Cardiovascular: Negative.   Gastrointestinal: Negative.   Endocrine: Negative.   Genitourinary: Negative.   Musculoskeletal: Negative.   Skin: Negative.   Allergic/Immunologic: Negative.   Neurological: Negative.   Hematological: Negative.   Psychiatric/Behavioral: Negative.     Social History   Social History  . Marital status: Widowed    Spouse name: N/A  . Number of children: N/A  . Years of education: N/A   Occupational History  . Not on file.   Social History Main Topics  . Smoking status: Former Research scientist (life sciences)  . Smokeless tobacco: Not on file     Comment: QUIT IN 2002  . Alcohol use No  . Drug use: No  . Sexual activity: Not on file   Other Topics Concern  . Not on file   Social History Narrative  . No narrative on file    Patient Active Problem List   Diagnosis Date Noted  . Depression 05/03/2016  . Anxiety 05/03/2016  . Mild dementia 04/19/2016  . COPD, moderate ([place]) 03/05/2015  . Anxiety and depression 03/04/2015  . Arteriosclerosis of coronary artery 03/04/2015  . CAFL (chronic airflow limitation) ([place]) 03/04/2015  . Breathlessness on exertion 03/04/2015  . HLD (hyperlipidemia) 03/04/2015  . Osteopenia 03/04/2015  . Peptic ulcer 03/04/2015  . Amnesia 03/20/2014  . Disordered sleep 03/20/2014  .  Allergy to environmental factors 11/14/2013  . Gastroduodenal ulcer 11/14/2013  . GI bleed 11/14/2013    Past Surgical History:  Procedure Laterality Date  . BALLOON ANGIOPLASTY, ARTERY  05/17/2009  . CATARACT EXTRACTION W/PHACO Left 05/24/2016   Procedure: CATARACT EXTRACTION PHACO AND INTRAOCULAR LENS PLACEMENT (IOC);  Surgeon: Birder Robson, MD;  Location: [facility] ORS;  Service: Ophthalmology;  Laterality: Left;  Lot# [id number] H [place]:   00:52.3 AP%:   27.3 CDE:  14.26   . CATARACT EXTRACTION W/PHACO Right 06/14/2016   Procedure: CATARACT EXTRACTION PHACO AND INTRAOCULAR LENS PLACEMENT (IOC);  Surgeon: Birder Robson, MD;  Location: [facility] ORS;  Service: Ophthalmology;  Laterality: Right;  Lot# [id number] H [place]: 01:13.4 AP%: 24.4 CDE: 17.85  . CORONARY ANGIOPLASTY     STENT    Her family history includes Colon cancer in her mother; Diabetes in her mother; Hypertension in her mother.     Previous Medications   ACETAMINOPHEN (TYLENOL) 500 MG TABLET    Take 1 tablet by mouth at bedtime.    ALBUTEROL (VENTOLIN HFA) 108 (90 BASE) MCG/ACT INHALER    Inhale 2 puffs into the lungs every 6 (six) hours as needed.   ASPIRIN 325 MG EC TABLET    Take 1 tablet by mouth at bedtime.    BUDESONIDE-FORMOTEROL (SYMBICORT) 160-4.5 MCG/ACT INHALER    Inhale 2 puffs into the lungs 2 (two) times daily.   CALCIUM CARB-CHOLECALCIFEROL (CALCIUM 600+D) 600-800 MG-UNIT TABS    Take 2 tablets by mouth daily.   CLONAZEPAM (KLONOPIN)  0.5 MG TABLET    Take 1 tablet (0.5 mg total) by mouth 2 (two) times daily as needed for anxiety.   DIFLUPREDNATE (DUREZOL OP)    Place 1 drop into the left eye 2 (two) times daily.   DOCUSATE SODIUM (COLACE) 100 MG CAPSULE    Take 200 mg by mouth at bedtime.   DONEPEZIL (ARICEPT) 5 MG TABLET       IBUPROFEN (ADVIL,MOTRIN) 200 MG TABLET    Take 400 mg by mouth daily as needed for headache or moderate pain.    ILEVRO 0.3 % OPHTHALMIC SUSPENSION       LORATADINE 10 MG CAPS    Take 10 mg by  mouth daily.    PAROXETINE (PAXIL) 10 MG TABLET    TAKE TWO TABLETS BY MOUTH AT BEDTIME DAILY   PRAVASTATIN (PRAVACHOL) 20 MG TABLET    Take 10 mg by mouth daily.    TIOTROPIUM (SPIRIVA HANDIHALER) 18 MCG INHALATION CAPSULE    Place 1 capsule (18 mcg total) into inhaler and inhale daily.    Patient Care Team: Margo Common, PA as PCP - General (Physician Assistant)      Objective:   Vitals: BP 118/78 (BP Location: Right Arm, Patient Position: Sitting, Cuff Size: Normal)   Pulse 72   Temp 98 F (36.7 C) (Oral)   Resp 16   Ht 5' 0.5" (1.537 m)   Wt 127 lb 6.4 oz (57.8 kg)   BMI 24.47 kg/m   Physical Exam  Constitutional: She appears well-developed and well-nourished.  HENT:  Head: Normocephalic.  Nose: Nose normal.  Mouth/Throat: Oropharynx is clear and moist.  Eyes: Conjunctivae and EOM are normal.  Dilated blood vessels lateral cornea of the left eye.  Neck: Neck supple. No thyromegaly present.  Cardiovascular: Normal rate.   Pulmonary/Chest: Effort normal and breath sounds normal.  Abdominal: Soft.  Musculoskeletal: She exhibits no edema.  Enlarged joints of first metacarpal - carpal of each wrist.. Fair ROM without pain. Stiffness of spine with decrease in ROM. Denies pain or numbness.  Neurological: She is alert. She has normal reflexes.  Mild dementia followed by Dr. Melrose Nakayama.  Psychiatric: She has a normal mood and affect. Her behavior is normal.  Occasional anxiety. Controlled by Paroxetine and Clonazepam.    Activities of Daily Living No flowsheet data found.  Fall Risk Assessment Fall Risk  06/29/2015  Falls in the past year? No     Depression Screen PHQ 2/9 Scores 06/29/2015  PHQ - 2 Score 0    Cognitive Testing - 6-CIT  Correct? Score   What year is it? yes 0 0 or 4  What month is it? yes 0 0 or 3  Memorize:    Pia Mau,  44,  [place],      What time is it? (within 1 hour) yes 0 0 or 3  Count backwards from 20 yes 0 0, 2, or 4   Name the months of the year yes 0 0, 2, or 4  Repeat name & address above yes 8 0, 2, 4, 6, 8, or 10       TOTAL SCORE  8/28   Interpretation:  Abnormal- 8  Normal (0-7) Abnormal (8-28)   Audit-C Alcohol Use Screening  Question Answer Points  How often do you have alcoholic drink? never 0  On days you do drink alcohol, how many drinks do you typically consume? 0 0  How oftey will you drink 6 or  more in a total? never 0  Total Score:  0   A score of 3 or more in women, and 4 or more in men indicates increased risk for alcohol abuse, EXCEPT if all of the points are from question 1.      Assessment & Plan:     Annual Wellness Visit  Reviewed patient's Family Medical History Reviewed and updated list of patient's medical providers Assessment of cognitive impairment was done Assessed patient's functional ability Established a written schedule for health screening [place] Completed and Reviewed  Exercise Activities and Dietary recommendations Goals    Walk for exercise as joints allows.      Immunization History  Administered Date(s) Administered  . Pneumococcal Conjugate-13 06/26/2014  . Pneumococcal Polysaccharide-23 07/19/2006, 02/13/2008  . Zoster 02/13/2008    Health Maintenance  Topic Date Due  . TETANUS/TDAP  01/15/1957  . INFLUENZA VACCINE  11/05/2016 (Originally 03/08/2016)  . DEXA SCAN  Completed  . ZOSTAVAX  Completed  . PNA vac Low Risk Adult  Completed     Discussed health benefits of physical activity, and encouraged her to engage in regular exercise appropriate for her age and condition.    ------------------------------------------------------------------------------------------------------------ 1. Medicare annual wellness visit, subsequent General health stable. Aged out of mammograms and pelvic exams. Always declines colonoscopies. Will check with her pharmacy regarding up date of tetanus. Has had Zostavax and both  pneumonia vaccinations. Refuses flu shots.  2. COPD, moderate ([place]) Still taking the Spiriva, Symbicort and Albuterol. Denies dyspnea, wheeze or congestion. Followed by pulmonologist (Dr. Raul Del). Recheck CBC later this week. - CBC with Differential/Platelet; Future  3. Mild dementia Was not able to tolerate Aricept (caused restless legs) prescribed by Dr. Melrose Nakayama (neurologist). Still drives some and does not feel memory is worsening. MMSE score 24/30 by Dr. Melrose Nakayama on 03-19-16. Will check labs later this week and recheck with Dr. Melrose Nakayama nest month. - CBC with Differential/Platelet; Future - Comprehensive metabolic panel; Future - TSH; Future  4. Mixed hyperlipidemia Tolerating Pravastatin 20 mg qd and trying to control weight with low fat diet. Recheck labs later this week and follow up pending reports. - Comprehensive metabolic panel; Future - Lipid panel; Future - TSH; Future  5. Anxiety and depression Stable and well controlled with use of Clonazepam 0.5 mg BID and Paroxetine 10 mg HS. Recheck labs later this week and follow up pending reports. - CBC with Differential/Platelet; Future - Comprehensive metabolic panel; Future - TSH; Future

## 2016-07-06 ENCOUNTER — Other Ambulatory Visit: Payer: Self-pay | Admitting: Family Medicine

## 2016-07-06 DIAGNOSIS — F0390 Unspecified dementia without behavioral disturbance: Secondary | ICD-10-CM | POA: Diagnosis not present

## 2016-07-06 DIAGNOSIS — E782 Mixed hyperlipidemia: Secondary | ICD-10-CM | POA: Diagnosis not present

## 2016-07-06 DIAGNOSIS — F418 Other specified anxiety disorders: Secondary | ICD-10-CM | POA: Diagnosis not present

## 2016-07-07 ENCOUNTER — Telehealth: Payer: Self-pay

## 2016-07-07 LAB — COMPREHENSIVE METABOLIC PANEL
ALBUMIN: 4.1 g/dL (ref 3.5–4.8)
ALT: 14 IU/L (ref 0–32)
AST: 19 IU/L (ref 0–40)
Albumin/Globulin Ratio: 2 (ref 1.2–2.2)
Alkaline Phosphatase: 75 IU/L (ref 39–117)
BUN / CREAT RATIO: 18 (ref 12–28)
BUN: 22 mg/dL (ref 8–27)
Bilirubin Total: <0.2 mg/dL (ref 0.0–1.2)
CALCIUM: 9.8 mg/dL (ref 8.7–10.3)
CO2: 22 mmol/L (ref 18–29)
CREATININE: 1.22 mg/dL — AB (ref 0.57–1.00)
Chloride: 102 mmol/L (ref 96–106)
GFR, EST AFRICAN AMERICAN: 49 mL/min/{1.73_m2} — AB (ref >59)
GFR, EST NON AFRICAN AMERICAN: 43 mL/min/{1.73_m2} — AB (ref >59)
GLUCOSE: 89 mg/dL (ref 65–99)
Globulin, Total: 2.1 g/dL (ref 1.5–4.5)
Potassium: 4.5 mmol/L (ref 3.5–5.2)
Sodium: 142 mmol/L (ref 134–144)
TOTAL PROTEIN: 6.2 g/dL (ref 6.0–8.5)

## 2016-07-07 LAB — CBC WITH DIFFERENTIAL/PLATELET
BASOS ABS: 0.1 10*3/uL (ref 0.0–0.2)
Basos: 1 %
EOS (ABSOLUTE): 0.1 10*3/uL (ref 0.0–0.4)
Eos: 2 %
HEMOGLOBIN: 11.1 g/dL (ref 11.1–15.9)
Hematocrit: 34.2 % (ref 34.0–46.6)
IMMATURE GRANS (ABS): 0 10*3/uL (ref 0.0–0.1)
Immature Granulocytes: 0 %
LYMPHS: 24 %
Lymphocytes Absolute: 1.4 10*3/uL (ref 0.7–3.1)
MCH: 29.8 pg (ref 26.6–33.0)
MCHC: 32.5 g/dL (ref 31.5–35.7)
MCV: 92 fL (ref 79–97)
MONOCYTES: 7 %
Monocytes Absolute: 0.4 10*3/uL (ref 0.1–0.9)
NEUTROS ABS: 3.9 10*3/uL (ref 1.4–7.0)
Neutrophils: 66 %
Platelets: 239 10*3/uL (ref 150–379)
RBC: 3.73 x10E6/uL — AB (ref 3.77–5.28)
RDW: 13.9 % (ref 12.3–15.4)
WBC: 5.9 10*3/uL (ref 3.4–10.8)

## 2016-07-07 LAB — LIPID PANEL
Chol/HDL Ratio: 1.8 ratio units (ref 0.0–4.4)
Cholesterol, Total: 203 mg/dL — ABNORMAL HIGH (ref 100–199)
HDL: 112 mg/dL (ref >39)
LDL CALC: 77 mg/dL (ref 0–99)
Triglycerides: 72 mg/dL (ref 0–149)
VLDL CHOLESTEROL CAL: 14 mg/dL (ref 5–40)

## 2016-07-07 LAB — TSH: TSH: 2.05 u[IU]/mL (ref 0.450–4.500)

## 2016-07-07 NOTE — Telephone Encounter (Signed)
Patient advised. She will call back to schedule a follow up appointment.

## 2016-07-07 NOTE — Telephone Encounter (Signed)
-----   Message from Margo Common, [place] sent at 07/07/2016 11:57 AM EST ----- Normal blood cell counts and no anemia. Kidney function shows some strain - need to drink extra water in diet. Cholesterol and thyroid tests are in very good shape. Schedule appointment to recheck lungs and cholesterol in 4-6 months. Continue present medications.

## 2016-07-14 ENCOUNTER — Other Ambulatory Visit: Payer: Self-pay | Admitting: Family Medicine

## 2016-07-14 ENCOUNTER — Telehealth: Payer: Self-pay | Admitting: Family Medicine

## 2016-07-14 DIAGNOSIS — J449 Chronic obstructive pulmonary disease, unspecified: Secondary | ICD-10-CM

## 2016-07-14 MED ORDER — AZITHROMYCIN 250 MG PO TABS: ORAL_TABLET | ORAL | 0 refills | Status: DC

## 2016-07-14 NOTE — Telephone Encounter (Signed)
Advised patient as below.  

## 2016-07-14 NOTE — Telephone Encounter (Signed)
Medication sent to [facility]. Remind patient to use inhaler and Mucinex-DM for cough and wheeze. Should recheck here in 5 days or follow up with Dr. Raul Del if no better.

## 2016-07-14 NOTE — Telephone Encounter (Signed)
Please review. Thanks!  

## 2016-07-14 NOTE — Telephone Encounter (Signed)
Pt called wanting a RX for a Zpak.  She is coughing and wheezing a little.  Talked to [place] she said I could send a message back.  She uses [facility].  Thanks, [employer]

## 2016-07-18 ENCOUNTER — Ambulatory Visit (INDEPENDENT_AMBULATORY_CARE_PROVIDER_SITE_OTHER): Payer: PPO | Admitting: Family Medicine

## 2016-07-18 ENCOUNTER — Encounter: Payer: Self-pay | Admitting: Family Medicine

## 2016-07-18 VITALS — BP 112/82 | HR 96 | Temp 98.1°F | Resp 18 | Wt 126.4 lb

## 2016-07-18 DIAGNOSIS — J45901 Unspecified asthma with (acute) exacerbation: Secondary | ICD-10-CM

## 2016-07-18 DIAGNOSIS — J441 Chronic obstructive pulmonary disease with (acute) exacerbation: Secondary | ICD-10-CM | POA: Diagnosis not present

## 2016-07-18 MED ORDER — IPRATROPIUM-ALBUTEROL 0.5-2.5 (3) MG/3ML IN SOLN: 3.0000 mL | Freq: Once | RESPIRATORY_TRACT | Status: DC

## 2016-07-18 MED ORDER — LEVOFLOXACIN 500 MG PO TABS: 500.0000 mg | ORAL_TABLET | Freq: Every day | ORAL | 0 refills | Status: DC

## 2016-07-18 MED ORDER — PREDNISONE 5 MG PO TABS: 5.0000 mg | ORAL_TABLET | Freq: Every day | ORAL | 3 refills | Status: DC

## 2016-07-18 NOTE — Progress Notes (Signed)
Patient: Stephanie [name] Female    DOB: April 30, 1936   80 y.o.   MRN: [id number] Visit Date: 07/18/2016  Today's Provider: Vernie Murders, PA   Chief Complaint  Patient presents with  . Cough   Subjective:    Cough  This is a recurrent problem. Episode onset: Wednesday. The problem has been gradually worsening. The problem occurs every few minutes. Associated symptoms include shortness of breath and wheezing. Treatments tried: Z-pak started on 07/14/2016 and completed this morning  The treatment provided no relief. Her past medical history is significant for COPD.   Past Medical History:  Diagnosis Date  . Arthritis   . Complication of anesthesia   . COPD (chronic obstructive pulmonary disease) ([place])   . Coronary artery disease   . Depression   . Dyspnea   . Edema   . Pneumonia    IN PAST  . PONV (postoperative nausea and vomiting)   . Wheezing    Past Surgical History:  Procedure Laterality Date  . BALLOON ANGIOPLASTY, ARTERY  05/17/2009  . CATARACT EXTRACTION W/PHACO Left 05/24/2016   Procedure: CATARACT EXTRACTION PHACO AND INTRAOCULAR LENS PLACEMENT (IOC);  Surgeon: Birder Robson, MD;  Location: [facility] ORS;  Service: Ophthalmology;  Laterality: Left;  Lot# [id number] H [place]:   00:52.3 AP%:   27.3 CDE:  14.26   . CATARACT EXTRACTION W/PHACO Right 06/14/2016   Procedure: CATARACT EXTRACTION PHACO AND INTRAOCULAR LENS PLACEMENT (IOC);  Surgeon: Birder Robson, MD;  Location: [facility] ORS;  Service: Ophthalmology;  Laterality: Right;  Lot# [id number] H [place]: 01:13.4 AP%: 24.4 CDE: 17.85  . CORONARY ANGIOPLASTY     STENT   Family History  Problem Relation Age of Onset  . Hypertension Mother   . Diabetes Mother   . Colon cancer Mother    Allergies  Allergen Reactions  . Duloxetine Hcl   . Influenza Vaccines   . Other     STEROIDS  DON'T WORK FOR HER  . Venlafaxine     GI distress     Previous Medications   ACETAMINOPHEN (TYLENOL) 500 MG TABLET    Take 1 tablet by  mouth at bedtime.    ALBUTEROL (VENTOLIN HFA) 108 (90 BASE) MCG/ACT INHALER    Inhale 2 puffs into the lungs every 6 (six) hours as needed.   ASPIRIN 325 MG EC TABLET    Take 1 tablet by mouth at bedtime.    BUDESONIDE-FORMOTEROL (SYMBICORT) 160-4.5 MCG/ACT INHALER    Inhale 2 puffs into the lungs 2 (two) times daily.   CALCIUM CARB-CHOLECALCIFEROL (CALCIUM 600+D) 600-800 MG-UNIT TABS    Take 2 tablets by mouth daily.   CLONAZEPAM (KLONOPIN) 0.5 MG TABLET    Take 1 tablet (0.5 mg total) by mouth 2 (two) times daily as needed for anxiety.   DIFLUPREDNATE (DUREZOL OP)    Place 1 drop into the left eye 2 (two) times daily.   DOCUSATE SODIUM (COLACE) 100 MG CAPSULE    Take 200 mg by mouth at bedtime.   IBUPROFEN (ADVIL,MOTRIN) 200 MG TABLET    Take 400 mg by mouth daily as needed for headache or moderate pain.    PAROXETINE (PAXIL) 10 MG TABLET    TAKE TWO TABLETS BY MOUTH AT BEDTIME DAILY   PRAVASTATIN (PRAVACHOL) 20 MG TABLET    Take 10 mg by mouth daily.    TIOTROPIUM (SPIRIVA HANDIHALER) 18 MCG INHALATION CAPSULE    Place 1 capsule (18 mcg total) into inhaler and inhale daily.    Review of Systems  Constitutional: Negative.   Respiratory: Positive for cough, shortness of breath and wheezing.   Cardiovascular: Negative.     Social History  Substance Use Topics  . Smoking status: Former Research scientist (life sciences)  . Smokeless tobacco: Not on file     Comment: QUIT IN 2002  . Alcohol use No   Objective:   BP 112/82 (BP Location: Right Arm, Patient Position: Sitting, Cuff Size: Normal)   Pulse 96   Temp 98.1 F (36.7 C) (Oral)   Resp 18   Wt 126 lb 6.4 oz (57.3 kg)   SpO2 (!) 88%   BMI 24.28 kg/m   Physical Exam  Constitutional: She is oriented to person, place, and time. She appears well-developed and well-nourished.  HENT:  Head: Normocephalic.  Right Ear: External ear normal.  Left Ear: External ear normal.  Mouth/Throat: Oropharynx is clear and moist.  Eyes: Conjunctivae are normal.  Neck:  Neck supple.  Cardiovascular: Normal rate and regular rhythm.   Pulmonary/Chest:  Wheezes and slight rhonchi with dyspnea.  Abdominal: Soft.  Lymphadenopathy:    She has no cervical adenopathy.  Neurological: She is alert and oriented to person, place, and time.  Psychiatric: She has a normal mood and affect. Her behavior is normal.     Assessment & Plan:     1. Acute exacerbation of COPD with asthma ([place]) Onset over the past week. Started the Z-pak 5 days ago. Still having sputum production and dyspnea with wheezing. Wheezing better after Duoneb by nebulizer in the office. Pulse oximetry back up to 94% and continue Duoneb by nebulizer at home for 5 days. Add Mucinex and given Levofloxacin qd for a week. If fever develops and wheezing not controlled by nebulizer, Symbicort and rescue Albuterol, will need to go to the ER. Recheck in a week if no better. - ipratropium-albuterol (DUONEB) 0.5-2.5 (3) MG/3ML nebulizer solution 3 mL; Take 3 mLs by nebulization once. - predniSONE (DELTASONE) 5 MG tablet; Take 1 tablet (5 mg total) by mouth daily with breakfast. Taper down by one tablet daily (6,5,4,3,2,1)  Dispense: 21 tablet; Refill: 3 - levofloxacin (LEVAQUIN) 500 MG tablet; Take 1 tablet (500 mg total) by mouth daily.  Dispense: 7 tablet; Refill: 0

## 2016-07-19 ENCOUNTER — Encounter: Payer: Self-pay | Admitting: Emergency Medicine

## 2016-07-19 ENCOUNTER — Emergency Department: Payer: PPO

## 2016-07-19 ENCOUNTER — Inpatient Hospital Stay
Admission: EM | Admit: 2016-07-19 | Discharge: 2016-07-25 | DRG: 190 | Disposition: A | Discharge status: Skilled Nursing Facility | Payer: PPO | Attending: Internal Medicine | Admitting: Internal Medicine

## 2016-07-19 DIAGNOSIS — F0390 Unspecified dementia without behavioral disturbance: Secondary | ICD-10-CM | POA: Diagnosis not present

## 2016-07-19 DIAGNOSIS — K219 Gastro-esophageal reflux disease without esophagitis: Secondary | ICD-10-CM | POA: Diagnosis not present

## 2016-07-19 DIAGNOSIS — Z7982 Long term (current) use of aspirin: Secondary | ICD-10-CM | POA: Diagnosis not present

## 2016-07-19 DIAGNOSIS — Z8711 Personal history of peptic ulcer disease: Secondary | ICD-10-CM | POA: Diagnosis not present

## 2016-07-19 DIAGNOSIS — J9601 Acute respiratory failure with hypoxia: Secondary | ICD-10-CM | POA: Diagnosis not present

## 2016-07-19 DIAGNOSIS — F329 Major depressive disorder, single episode, unspecified: Secondary | ICD-10-CM | POA: Diagnosis present

## 2016-07-19 DIAGNOSIS — E871 Hypo-osmolality and hyponatremia: Secondary | ICD-10-CM | POA: Diagnosis not present

## 2016-07-19 DIAGNOSIS — M6281 Muscle weakness (generalized): Secondary | ICD-10-CM

## 2016-07-19 DIAGNOSIS — Z79899 Other long term (current) drug therapy: Secondary | ICD-10-CM

## 2016-07-19 DIAGNOSIS — J449 Chronic obstructive pulmonary disease, unspecified: Secondary | ICD-10-CM | POA: Diagnosis not present

## 2016-07-19 DIAGNOSIS — I2510 Atherosclerotic heart disease of native coronary artery without angina pectoris: Secondary | ICD-10-CM | POA: Diagnosis present

## 2016-07-19 DIAGNOSIS — Z7951 Long term (current) use of inhaled steroids: Secondary | ICD-10-CM

## 2016-07-19 DIAGNOSIS — J9600 Acute respiratory failure, unspecified whether with hypoxia or hypercapnia: Secondary | ICD-10-CM | POA: Diagnosis not present

## 2016-07-19 DIAGNOSIS — F418 Other specified anxiety disorders: Secondary | ICD-10-CM | POA: Diagnosis present

## 2016-07-19 DIAGNOSIS — R748 Abnormal levels of other serum enzymes: Secondary | ICD-10-CM | POA: Diagnosis not present

## 2016-07-19 DIAGNOSIS — J209 Acute bronchitis, unspecified: Secondary | ICD-10-CM | POA: Diagnosis not present

## 2016-07-19 DIAGNOSIS — Z8249 Family history of ischemic heart disease and other diseases of the circulatory system: Secondary | ICD-10-CM | POA: Diagnosis not present

## 2016-07-19 DIAGNOSIS — R0600 Dyspnea, unspecified: Secondary | ICD-10-CM

## 2016-07-19 DIAGNOSIS — Z887 Allergy status to serum and vaccine status: Secondary | ICD-10-CM | POA: Diagnosis not present

## 2016-07-19 DIAGNOSIS — J440 Chronic obstructive pulmonary disease with acute lower respiratory infection: Secondary | ICD-10-CM | POA: Diagnosis present

## 2016-07-19 DIAGNOSIS — R0602 Shortness of breath: Secondary | ICD-10-CM | POA: Diagnosis not present

## 2016-07-19 DIAGNOSIS — J441 Chronic obstructive pulmonary disease with (acute) exacerbation: Principal | ICD-10-CM

## 2016-07-19 DIAGNOSIS — Z955 Presence of coronary angioplasty implant and graft: Secondary | ICD-10-CM | POA: Diagnosis not present

## 2016-07-19 DIAGNOSIS — J45901 Unspecified asthma with (acute) exacerbation: Secondary | ICD-10-CM

## 2016-07-19 DIAGNOSIS — J189 Pneumonia, unspecified organism: Secondary | ICD-10-CM | POA: Diagnosis not present

## 2016-07-19 DIAGNOSIS — Z888 Allergy status to other drugs, medicaments and biological substances status: Secondary | ICD-10-CM | POA: Diagnosis not present

## 2016-07-19 DIAGNOSIS — J9690 Respiratory failure, unspecified, unspecified whether with hypoxia or hypercapnia: Secondary | ICD-10-CM

## 2016-07-19 DIAGNOSIS — R062 Wheezing: Secondary | ICD-10-CM | POA: Diagnosis not present

## 2016-07-19 DIAGNOSIS — Z87891 Personal history of nicotine dependence: Secondary | ICD-10-CM

## 2016-07-19 DIAGNOSIS — N182 Chronic kidney disease, stage 2 (mild): Secondary | ICD-10-CM | POA: Diagnosis present

## 2016-07-19 DIAGNOSIS — R262 Difficulty in walking, not elsewhere classified: Secondary | ICD-10-CM

## 2016-07-19 LAB — CBC
HCT: 34.5 % — ABNORMAL LOW (ref 35.0–47.0)
Hemoglobin: 11.8 g/dL — ABNORMAL LOW (ref 12.0–16.0)
MCH: 31.5 pg (ref 26.0–34.0)
MCHC: 34.1 g/dL (ref 32.0–36.0)
MCV: 92.3 fL (ref 80.0–100.0)
PLATELETS: 178 10*3/uL (ref 150–440)
RBC: 3.74 MIL/uL — AB (ref 3.80–5.20)
RDW: 14.2 % (ref 11.5–14.5)
WBC: 6.3 10*3/uL (ref 3.6–11.0)

## 2016-07-19 LAB — GLUCOSE, CAPILLARY: Glucose-Capillary: 118 mg/dL — ABNORMAL HIGH (ref 65–99)

## 2016-07-19 LAB — CBC WITH DIFFERENTIAL/PLATELET
BASOS PCT: 0 %
Basophils Absolute: 0 10*3/uL (ref 0–0.1)
EOS ABS: 0 10*3/uL (ref 0–0.7)
Eosinophils Relative: 0 %
HEMATOCRIT: 35.9 % (ref 35.0–47.0)
HEMOGLOBIN: 12.1 g/dL (ref 12.0–16.0)
Lymphocytes Relative: 6 %
Lymphs Abs: 0.5 10*3/uL — ABNORMAL LOW (ref 1.0–3.6)
MCH: 31 pg (ref 26.0–34.0)
MCHC: 33.6 g/dL (ref 32.0–36.0)
MCV: 92.1 fL (ref 80.0–100.0)
Monocytes Absolute: 0.5 10*3/uL (ref 0.2–0.9)
Monocytes Relative: 6 %
NEUTROS ABS: 7.3 10*3/uL — AB (ref 1.4–6.5)
NEUTROS PCT: 88 %
Platelets: 198 10*3/uL (ref 150–440)
RBC: 3.9 MIL/uL (ref 3.80–5.20)
RDW: 13.8 % (ref 11.5–14.5)
WBC: 8.4 10*3/uL (ref 3.6–11.0)

## 2016-07-19 LAB — BLOOD GAS, ARTERIAL
Acid-base deficit: 0.5 mmol/L (ref 0.0–2.0)
Bicarbonate: 24.9 mmol/L (ref 20.0–28.0)
DELIVERY SYSTEMS: POSITIVE
EXPIRATORY PAP: 6
FIO2: 0.3
INSPIRATORY PAP: 12
O2 Saturation: 96 %
PCO2 ART: 43 mmHg (ref 32.0–48.0)
PO2 ART: 84 mmHg (ref 83.0–108.0)
Patient temperature: 37
pH, Arterial: 7.37 (ref 7.350–7.450)

## 2016-07-19 LAB — INFLUENZA PANEL BY PCR (TYPE A & B)
INFLAPCR: NEGATIVE
INFLBPCR: NEGATIVE

## 2016-07-19 LAB — TROPONIN I
TROPONIN I: 0.13 ng/mL — AB (ref <0.03)
TROPONIN I: 0.13 ng/mL — AB (ref <0.03)

## 2016-07-19 LAB — BASIC METABOLIC PANEL
ANION GAP: 9 (ref 5–15)
BUN: 27 mg/dL — ABNORMAL HIGH (ref 6–20)
CHLORIDE: 93 mmol/L — AB (ref 101–111)
CO2: 25 mmol/L (ref 22–32)
Calcium: 9.4 mg/dL (ref 8.9–10.3)
Creatinine, Ser: 1.14 mg/dL — ABNORMAL HIGH (ref 0.44–1.00)
GFR calc non Af Amer: 45 mL/min — ABNORMAL LOW (ref >60)
GFR, EST AFRICAN AMERICAN: 52 mL/min — AB (ref >60)
Glucose, Bld: 140 mg/dL — ABNORMAL HIGH (ref 65–99)
POTASSIUM: 4.4 mmol/L (ref 3.5–5.1)
SODIUM: 127 mmol/L — AB (ref 135–145)

## 2016-07-19 LAB — MRSA PCR SCREENING: MRSA by PCR: NEGATIVE

## 2016-07-19 LAB — BRAIN NATRIURETIC PEPTIDE: B NATRIURETIC PEPTIDE 5: 299 pg/mL — AB (ref 0.0–100.0)

## 2016-07-19 MED ORDER — ORAL CARE MOUTH RINSE
15.0000 mL | Freq: Two times a day (BID) | OROMUCOSAL | Status: DC
  Administered 2016-07-19 – 2016-07-24 (×8): 15 mL via OROMUCOSAL

## 2016-07-19 MED ORDER — CHLORHEXIDINE GLUCONATE 0.12 % MT SOLN
15.0000 mL | Freq: Two times a day (BID) | OROMUCOSAL | Status: DC
  Administered 2016-07-19 – 2016-07-25 (×12): 15 mL via OROMUCOSAL
  Filled 2016-07-19 (×9): qty 15

## 2016-07-19 MED ORDER — SODIUM CHLORIDE 0.9 % IV SOLN
INTRAVENOUS | Status: AC
  Administered 2016-07-19 – 2016-07-20 (×2): via INTRAVENOUS

## 2016-07-19 MED ORDER — IPRATROPIUM-ALBUTEROL 0.5-2.5 (3) MG/3ML IN SOLN
RESPIRATORY_TRACT | Status: AC
  Filled 2016-07-19: qty 3

## 2016-07-19 MED ORDER — ENOXAPARIN SODIUM 40 MG/0.4ML ~~LOC~~ SOLN
40.0000 mg | SUBCUTANEOUS | Status: DC
  Administered 2016-07-19 – 2016-07-24 (×6): 40 mg via SUBCUTANEOUS
  Filled 2016-07-19 (×6): qty 0.4

## 2016-07-19 MED ORDER — ONDANSETRON HCL 4 MG PO TABS: 4.0000 mg | ORAL_TABLET | Freq: Four times a day (QID) | ORAL | Status: DC | PRN

## 2016-07-19 MED ORDER — ALBUTEROL SULFATE (2.5 MG/3ML) 0.083% IN NEBU
7.5000 mg | INHALATION_SOLUTION | Freq: Once | RESPIRATORY_TRACT | Status: AC
  Administered 2016-07-19: 7.5 mg via RESPIRATORY_TRACT

## 2016-07-19 MED ORDER — ALBUTEROL SULFATE (2.5 MG/3ML) 0.083% IN NEBU
INHALATION_SOLUTION | RESPIRATORY_TRACT | Status: AC
  Administered 2016-07-19: 7.5 mg via RESPIRATORY_TRACT
  Filled 2016-07-19: qty 9

## 2016-07-19 MED ORDER — IPRATROPIUM-ALBUTEROL 0.5-2.5 (3) MG/3ML IN SOLN
3.0000 mL | Freq: Four times a day (QID) | RESPIRATORY_TRACT | Status: DC
  Administered 2016-07-19 – 2016-07-20 (×2): 3 mL via RESPIRATORY_TRACT
  Filled 2016-07-19: qty 3

## 2016-07-19 MED ORDER — PANTOPRAZOLE SODIUM 40 MG IV SOLR: 40.0000 mg | INTRAVENOUS | Status: DC

## 2016-07-19 MED ORDER — MAGNESIUM SULFATE 2 GM/50ML IV SOLN
INTRAVENOUS | Status: AC
  Administered 2016-07-19: 2 g via INTRAVENOUS
  Filled 2016-07-19: qty 50

## 2016-07-19 MED ORDER — METHYLPREDNISOLONE SODIUM SUCC 125 MG IJ SOLR: 80.0000 mg | Freq: Four times a day (QID) | INTRAMUSCULAR | Status: DC

## 2016-07-19 MED ORDER — MAGNESIUM SULFATE 2 GM/50ML IV SOLN
2.0000 g | Freq: Once | INTRAVENOUS | Status: AC
  Administered 2016-07-19: 2 g via INTRAVENOUS

## 2016-07-19 MED ORDER — METHYLPREDNISOLONE SODIUM SUCC 40 MG IJ SOLR
40.0000 mg | Freq: Three times a day (TID) | INTRAMUSCULAR | Status: DC
  Administered 2016-07-19: 40 mg via INTRAVENOUS
  Filled 2016-07-19: qty 1

## 2016-07-19 MED ORDER — DEXTROSE 5 % IV SOLN
100.0000 mg | Freq: Once | INTRAVENOUS | Status: AC
  Administered 2016-07-19: 100 mg via INTRAVENOUS
  Filled 2016-07-19: qty 100

## 2016-07-19 MED ORDER — ONDANSETRON HCL 4 MG/2ML IJ SOLN
4.0000 mg | Freq: Four times a day (QID) | INTRAMUSCULAR | Status: DC | PRN
  Administered 2016-07-20: 4 mg via INTRAVENOUS
  Filled 2016-07-19: qty 2

## 2016-07-19 MED ORDER — IPRATROPIUM-ALBUTEROL 0.5-2.5 (3) MG/3ML IN SOLN: 3.0000 mL | Freq: Four times a day (QID) | RESPIRATORY_TRACT | Status: DC

## 2016-07-19 MED ORDER — DOXYCYCLINE HYCLATE 100 MG IV SOLR
100.0000 mg | Freq: Two times a day (BID) | INTRAVENOUS | Status: DC
  Administered 2016-07-20: 100 mg via INTRAVENOUS
  Filled 2016-07-19 (×2): qty 100

## 2016-07-19 MED ORDER — CLONAZEPAM 0.5 MG PO TABS
0.5000 mg | ORAL_TABLET | Freq: Two times a day (BID) | ORAL | Status: DC | PRN
  Administered 2016-07-19 – 2016-07-20 (×3): 0.5 mg via ORAL
  Filled 2016-07-19 (×4): qty 1

## 2016-07-19 MED ORDER — ALBUTEROL SULFATE (2.5 MG/3ML) 0.083% IN NEBU: 2.5000 mg | INHALATION_SOLUTION | RESPIRATORY_TRACT | Status: DC | PRN

## 2016-07-19 MED ORDER — DEXTROSE 5 % IV SOLN: 100.0000 mg | Freq: Two times a day (BID) | INTRAVENOUS | Status: DC

## 2016-07-19 MED ORDER — SODIUM CHLORIDE 0.9% FLUSH
3.0000 mL | Freq: Two times a day (BID) | INTRAVENOUS | Status: DC
  Administered 2016-07-19 – 2016-07-24 (×10): 3 mL via INTRAVENOUS

## 2016-07-19 MED ORDER — PAROXETINE HCL 10 MG PO TABS
10.0000 mg | ORAL_TABLET | Freq: Every day | ORAL | Status: DC
  Administered 2016-07-19 – 2016-07-24 (×6): 10 mg via ORAL
  Filled 2016-07-19 (×7): qty 1

## 2016-07-19 MED ORDER — HYDRALAZINE HCL 20 MG/ML IJ SOLN
10.0000 mg | INTRAMUSCULAR | Status: DC | PRN
  Administered 2016-07-19 – 2016-07-20 (×3): 20 mg via INTRAVENOUS
  Filled 2016-07-19 (×3): qty 1

## 2016-07-19 NOTE — ED Triage Notes (Signed)
Pt in via EMS with respiratory distress.  Pt on Bipap upon arrival.  Pt labored, tachypneic with retractions.  MD at bedside.  Respiratory at bedside to place on our BiPap.

## 2016-07-19 NOTE — Consult Note (Addendum)
[facility] Consultation     ASSESSMENT/PLAN   80 yo female with emphysema, now with AECOPD.   PULMONARY A:Acute hypoxic respiratory failure due to acute bronchitis with AECOPD.  P:   --Continue empiric abx for bronchitis.  --Check flu.  --IV steroids, nebs.  --Will monitor for need for intubation, though at present appears compensated.  --The patient and family at bedside confirm that the patient will be full code.   CARDIOVASCULAR A: Slight increase in troponin. Likely secondary to above.  P:  --Trend troponin.   RENAL A:  CKD Hyponatremia, suspect dehydration P:   --Continue IVF.   GASTROINTESTINAL A:  -- P:   Gi prophylaxis.   HEMATOLOGIC A:  -- P:  --  INFECTIOUS A:  -- P:    Micro/culture results:  BCx2 -- UC -- Sputum--  Antibiotics:   ENDOCRINE A:   P:   Monitor glucose  NEUROLOGIC A:  -- P:   RASS goal: -- --   MAJOR EVENTS/TEST RESULTS:   Best Practices  DVT Prophylaxis: Per hospitalist.  GI Prophylaxis: --   ---------------------------------------  ---------------------------------------   Name: Stephanie [name] MRN: [id number] DOB: 10/05/1935    ADMISSION DATE:  07/19/2016 CONSULTATION DATE:  07/19/16  REFERRING MD :  Dr. Margaretmary Eddy  CHIEF COMPLAINT:  Dyspnea.   HISTORY OF PRESENT ILLNESS:  The patient is a 80 yo female with a history of COPD, who sees Dr. Raul Del as her pulmonary doctor. She is on spiriva and symbicort. She also has a history of GI bleed, PUD, and previous history of smoking.  She presents now with gradually worsening dyspnea and cough with wheezing. She was given a Z pack last week with no relief. Here in the ED she was put on bipap.  Currently she is speaking on bipap and does not appear to be in respiratory distress. Her daughters are at bedside and note that she is looking much better than a few hours ago.   Review of CXR image shows hyperinflation. Labs studies show  hyponatremia with CKD mild troponin bump of 0.13, otherwise unremarkable thus far.  ABG reviewed; on bipap, appears compensated, no hypercapnia.   PAST MEDICAL HISTORY :  Past Medical History:  Diagnosis Date  . Arthritis   . Complication of anesthesia   . COPD (chronic obstructive pulmonary disease) ([place])   . Coronary artery disease   . Depression   . Dyspnea   . Edema   . Pneumonia    IN PAST  . PONV (postoperative nausea and vomiting)   . Wheezing    Past Surgical History:  Procedure Laterality Date  . BALLOON ANGIOPLASTY, ARTERY  05/17/2009  . CATARACT EXTRACTION W/PHACO Left 05/24/2016   Procedure: CATARACT EXTRACTION PHACO AND INTRAOCULAR LENS PLACEMENT (IOC);  Surgeon: Birder Robson, MD;  Location: [facility] ORS;  Service: Ophthalmology;  Laterality: Left;  Lot# [id number] H [place]:   00:52.3 AP%:   27.3 CDE:  14.26   . CATARACT EXTRACTION W/PHACO Right 06/14/2016   Procedure: CATARACT EXTRACTION PHACO AND INTRAOCULAR LENS PLACEMENT (IOC);  Surgeon: Birder Robson, MD;  Location: [facility] ORS;  Service: Ophthalmology;  Laterality: Right;  Lot# [id number] H [place]: 01:13.4 AP%: 24.4 CDE: 17.85  . CORONARY ANGIOPLASTY     STENT   Prior to Admission medications   Medication Sig Start Date End Date Taking? Authorizing Provider  acetaminophen (TYLENOL) 500 MG tablet Take 1 tablet by mouth at bedtime.     Historical Provider, MD  albuterol (VENTOLIN HFA) 108 (90  Base) MCG/ACT inhaler Inhale 2 puffs into the lungs every 6 (six) hours as needed. 09/22/15   Vickki Muff Chrismon, PA  aspirin 325 MG EC tablet Take 1 tablet by mouth at bedtime.     Historical Provider, MD  budesonide-formoterol (SYMBICORT) 160-4.5 MCG/ACT inhaler Inhale 2 puffs into the lungs 2 (two) times daily. 09/22/15   Vickki Muff Chrismon, PA  Calcium Carb-Cholecalciferol (CALCIUM 600+D) 600-800 MG-UNIT TABS Take 2 tablets by mouth daily.    Historical Provider, MD  clonazePAM (KLONOPIN) 0.5 MG tablet Take 1 tablet (0.5 mg total) by  mouth 2 (two) times daily as needed for anxiety. Patient taking differently: Take 0.5 mg by mouth 2 (two) times daily.  01/05/16   Birdie Sons, MD  docusate sodium (COLACE) 100 MG capsule Take 200 mg by mouth at bedtime.    Historical Provider, MD  ibuprofen (ADVIL,MOTRIN) 200 MG tablet Take 400 mg by mouth daily as needed for headache or moderate pain.     Historical Provider, MD  levofloxacin (LEVAQUIN) 500 MG tablet Take 1 tablet (500 mg total) by mouth daily. 07/18/16   Vickki Muff Chrismon, PA  PARoxetine (PAXIL) 10 MG tablet TAKE TWO TABLETS BY MOUTH AT BEDTIME DAILY Patient taking differently: take 10 mgs at night daily 01/21/16   Vickki Muff Chrismon, PA  pravastatin (PRAVACHOL) 20 MG tablet Take 10 mg by mouth daily.  01/28/14   Historical Provider, MD  predniSONE (DELTASONE) 5 MG tablet Take 1 tablet (5 mg total) by mouth daily with breakfast. Taper down by one tablet daily (6,5,4,3,2,1) 07/18/16   Vickki Muff Chrismon, PA  tiotropium (SPIRIVA HANDIHALER) 18 MCG inhalation capsule Place 1 capsule (18 mcg total) into inhaler and inhale daily. 01/05/16   Birdie Sons, MD   Allergies  Allergen Reactions  . Duloxetine Hcl   . Influenza Vaccines   . Other     STEROIDS  DON'T WORK FOR HER  . Venlafaxine     GI distress    FAMILY HISTORY:  Family History  Problem Relation Age of Onset  . Hypertension Mother   . Diabetes Mother   . Colon cancer Mother    SOCIAL HISTORY:  reports that she has quit smoking. She has never used smokeless tobacco. She reports that she does not drink alcohol or use drugs.  REVIEW OF SYSTEMS:   Could not obtain due to critical illness/bipap.    VITAL SIGNS: Pulse Rate:  [120-124] 120 (12/12 1515) Resp:  [24-25] 24 (12/12 1515) BP: (207)/(154) 207/154 (12/12 1515) SpO2:  [86 %-96 %] 96 % (12/12 1516) Weight:  [59 kg (130 lb)] 59 kg (130 lb) (12/12 1515) HEMODYNAMICS:   VENTILATOR SETTINGS:   INTAKE / OUTPUT: No intake or output data in the 24 hours  ending 07/19/16 1706  Physical Examination:   VS: BP (!) 207/154 (BP Location: Right Arm)   Pulse (!) 120   Resp (!) 24   Ht '5\' 2"'$  (1.575 m)   Wt 59 kg (130 lb)   SpO2 96%   BMI 23.78 kg/m   General Appearance: No distress  Neuro:without focal findings, mental status normal.  HEENT: PERRLA, EOM intact. Pulmonary: Decreased air entry bilalaterally, scattered wheezing , diaphragmatic excursion normal. CardiovascularNormal S1,S2.  No m/r/g.    Abdomen: Benign, Soft, non-tender, No masses, hepatosplenomegaly. Renal:  No costovertebral tenderness  GU:  Not performed at this time. Endoc: No evident thyromegaly, no signs of acromegaly. Skin:   warm, no rashes, no ecchymosis  Extremities: normal,  no cyanosis, clubbing, 1+ bilat LE edema.    LABS: Reviewed   LABORATORY PANEL:   CBC  Recent Labs Lab 07/19/16 1510  WBC 8.4  HGB 12.1  HCT 35.9  PLT 198    Chemistries   Recent Labs Lab 07/19/16 1510  NA 127*  K 4.4  CL 93*  CO2 25  GLUCOSE 140*  BUN 27*  CREATININE 1.14*  CALCIUM 9.4    No results for input(s): GLUCAP in the last 168 hours. No results for input(s): PHART, PCO2ART, PO2ART in the last 168 hours. No results for input(s): AST, ALT, ALKPHOS, BILITOT, ALBUMIN in the last 168 hours.  Cardiac Enzymes  Recent Labs Lab 07/19/16 1510  TROPONINI 0.13*    RADIOLOGY:  Dg Chest 1 View  Result Date: 07/19/2016 CLINICAL DATA:  Shortness of Breath EXAM: CHEST 1 VIEW COMPARISON:  November 03, 2015 FINDINGS: There is mild scarring in the left base. There is no edema or consolidation. Heart is upper normal in size with pulmonary vascularity within normal limits. No adenopathy. There is atherosclerotic calcification in the aorta. No bone lesions. IMPRESSION: Mild scarring left base. No edema or consolidation. Aortic atherosclerosis. Electronically Signed   By: Lowella Grip III M.D.   On: 07/19/2016 16:08       --Marda Stalker, MD.  Board Certified  in Internal Medicine, Pulmonary Medicine, [place], and Sleep Medicine.  ICU Pager [phone] [facility] Office Number: [phone]  Patricia Pesa, M.D.  Vilinda Boehringer, M.D.  Merton Border, M.D   07/19/2016, 5:06 PM  Critical Care Attestation.  I have personally obtained a history, examined the patient, evaluated laboratory and imaging results, formulated the assessment and plan and placed orders. The Patient requires high complexity decision making for assessment and support, frequent evaluation and titration of therapies, application of advanced monitoring technologies and extensive interpretation of multiple databases. The patient has critical illness that could lead imminently to failure of 1 or more organ systems and requires the highest level of physician preparedness to intervene.  Critical Care Time devoted to patient care services described in this note is 35 minutes and is exclusive of time spent in procedures.

## 2016-07-19 NOTE — H&P (Signed)
[facility] [place] NAME: Stephanie [name]    MR#:  [id number]  DATE OF BIRTH:  1936/05/12  DATE OF ADMISSION:  07/19/2016  PRIMARY CARE PHYSICIAN: Vernie Murders, PA   REQUESTING/REFERRING PHYSICIAN: Clearnce Hasten, MD  CHIEF COMPLAINT:  Shortness of breath  HISTORY OF PRESENT ILLNESS:  Stephanie [name]  is a 80 y.o. female with a known history of COPD, chronic coronary artery disease and multiple other medical problems is presenting to the emergency department with shortness of breath. She was seen by her primary care provider yesterday and she was discharged with steroids and antibiotics for COPD exacerbation with no significant improvement. [name]. Feeling tight in her chest and patient was given Solu-Medrol 120 mg and magnesium and nebulizer treatments. In the emergency Department patient is placed on BiPAP and still complaining of chest tightness during my examination. Unable to talk in complete sentences. Family members at bedside including her daughter  PAST MEDICAL HISTORY:   Past Medical History:  Diagnosis Date  . Arthritis   . Complication of anesthesia   . COPD (chronic obstructive pulmonary disease) ([place])   . Coronary artery disease   . Depression   . Dyspnea   . Edema   . Pneumonia    IN PAST  . PONV (postoperative nausea and vomiting)   . Wheezing     PAST SURGICAL HISTOIRY:   Past Surgical History:  Procedure Laterality Date  . BALLOON ANGIOPLASTY, ARTERY  05/17/2009  . CATARACT EXTRACTION W/PHACO Left 05/24/2016   Procedure: CATARACT EXTRACTION PHACO AND INTRAOCULAR LENS PLACEMENT (IOC);  Surgeon: Birder Robson, MD;  Location: [facility] ORS;  Service: Ophthalmology;  Laterality: Left;  Lot# [id number] H [place]:   00:52.3 AP%:   27.3 CDE:  14.26   . CATARACT EXTRACTION W/PHACO Right 06/14/2016   Procedure: CATARACT EXTRACTION PHACO AND INTRAOCULAR LENS PLACEMENT (IOC);   Surgeon: Birder Robson, MD;  Location: [facility] ORS;  Service: Ophthalmology;  Laterality: Right;  Lot# [id number] H [place]: 01:13.4 AP%: 24.4 CDE: 17.85  . CORONARY ANGIOPLASTY     STENT    SOCIAL HISTORY:   Social History  Substance Use Topics  . Smoking status: Former Research scientist (life sciences)  . Smokeless tobacco: Never Used     Comment: QUIT IN 2002  . Alcohol use No    FAMILY HISTORY:   Family History  Problem Relation Age of Onset  . Hypertension Mother   . Diabetes Mother   . Colon cancer Mother     DRUG ALLERGIES:   Allergies  Allergen Reactions  . Duloxetine Hcl   . Influenza Vaccines   . Other     STEROIDS  DON'T WORK FOR HER  . Venlafaxine     GI distress    REVIEW OF SYSTEMS:  CONSTITUTIONAL: No fever, fatigue or weakness.  EYES: No blurred or double vision.  EARS, NOSE, AND THROAT: No tinnitus or ear pain.  RESPIRATORY: Reporting cough, worsening shortness of breath, tightness in her chest denies wheezing or hemoptysis.  CARDIOVASCULAR: No chest pain, orthopnea, edema.  GASTROINTESTINAL: No nausea, vomiting, diarrhea or abdominal pain.  GENITOURINARY: No dysuria, hematuria.  ENDOCRINE: No polyuria, nocturia,  HEMATOLOGY: No anemia, easy bruising or bleeding SKIN: No rash or lesion. MUSCULOSKELETAL: No joint pain or arthritis.   NEUROLOGIC: No tingling, numbness, weakness.  PSYCHIATRY: No anxiety or depression.   MEDICATIONS AT HOME:   Prior to Admission medications   Medication Sig Start Date End Date  Taking? Authorizing Provider  acetaminophen (TYLENOL) 500 MG tablet Take 1 tablet by mouth at bedtime.    Yes Historical Provider, MD  albuterol (VENTOLIN HFA) 108 (90 Base) MCG/ACT inhaler Inhale 2 puffs into the lungs every 6 (six) hours as needed. 09/22/15  Yes Dennis E Chrismon, PA  aspirin 325 MG EC tablet Take 1 tablet by mouth at bedtime.    Yes Historical Provider, MD  budesonide-formoterol (SYMBICORT) 160-4.5 MCG/ACT inhaler Inhale 2 puffs into the lungs 2 (two)  times daily. 09/22/15  Yes Dennis E Chrismon, PA  Calcium Carb-Cholecalciferol (CALCIUM 600+D) 600-800 MG-UNIT TABS Take 2 tablets by mouth daily.   Yes Historical Provider, MD  clonazePAM (KLONOPIN) 0.5 MG tablet Take 1 tablet (0.5 mg total) by mouth 2 (two) times daily as needed for anxiety. Patient taking differently: Take 0.5 mg by mouth 2 (two) times daily.  01/05/16  Yes Birdie Sons, MD  docusate sodium (COLACE) 100 MG capsule Take 200 mg by mouth at bedtime.   Yes Historical Provider, MD  DUREZOL 0.05 % EMUL Place 1 drop into the right eye 2 (two) times daily. 06/06/16  Yes Historical Provider, MD  ibuprofen (ADVIL,MOTRIN) 200 MG tablet Take 400 mg by mouth daily as needed for headache or moderate pain.    Yes Historical Provider, MD  levofloxacin (LEVAQUIN) 500 MG tablet Take 1 tablet (500 mg total) by mouth daily. 07/18/16  Yes Dennis E Chrismon, PA  PARoxetine (PAXIL) 10 MG tablet TAKE TWO TABLETS BY MOUTH AT BEDTIME DAILY Patient taking differently: take 10 mgs at night daily 01/21/16  Yes Dennis E Chrismon, PA  pravastatin (PRAVACHOL) 20 MG tablet Take 10 mg by mouth daily.  01/28/14  Yes Historical Provider, MD  predniSONE (DELTASONE) 5 MG tablet Take 1 tablet (5 mg total) by mouth daily with breakfast. Taper down by one tablet daily (6,5,4,3,2,1) Patient taking differently: Take 5 mg by mouth daily with breakfast. Taper down by one tablet daily (6,5,4,3,2,1) 07/19/2016 took 3 tablets of the 5 07/18/16  Yes Dennis E Chrismon, PA  tiotropium (SPIRIVA HANDIHALER) 18 MCG inhalation capsule Place 1 capsule (18 mcg total) into inhaler and inhale daily. 01/05/16  Yes Birdie Sons, MD      VITAL SIGNS:  Blood pressure (!) 207/154, pulse (!) 120, resp. rate (!) 24, height '5\' 2"'$  (1.575 m), weight 59 kg (130 lb), SpO2 96 %.  PHYSICAL EXAMINATION:  GENERAL:  80 y.o.-year-old patient lying in the bed with no acute distress.  EYES: Pupils equal, round, reactive to light and accommodation. No  scleral icterus. Extraocular muscles intact.  HEENT: Head atraumatic, normocephalic. Oropharynx and nasopharynx clear.  NECK:  Supple, no jugular venous distention. No thyroid enlargement, no tenderness.  LUNGS: Diminished breath sounds bilaterally, no wheezing, rales,rhonchi or crepitation. Positive use of accessory muscles of respiration.  CARDIOVASCULAR: S1, S2 normal. No murmurs, rubs, or gallops.  ABDOMEN: Soft, nontender, nondistended. Bowel sounds present. No organomegaly or mass.  EXTREMITIES: No pedal edema, cyanosis, or clubbing.  NEUROLOGIC: Cranial nerves II through XII are intact. Muscle strength 5/5 in all extremities. Sensation intact. Gait not checked.  PSYCHIATRIC: The patient is alert and oriented x 3.  SKIN: No obvious rash, lesion, or ulcer.   LABORATORY PANEL:   CBC  Recent Labs Lab 07/19/16 1510  WBC 8.4  HGB 12.1  HCT 35.9  PLT 198   ------------------------------------------------------------------------------------------------------------------  Chemistries   Recent Labs Lab 07/19/16 1510  NA 127*  K 4.4  CL 93*  CO2  25  GLUCOSE 140*  BUN 27*  CREATININE 1.14*  CALCIUM 9.4   ------------------------------------------------------------------------------------------------------------------  Cardiac Enzymes  Recent Labs Lab 07/19/16 1510  TROPONINI 0.13*   ------------------------------------------------------------------------------------------------------------------  RADIOLOGY:  Dg Chest 1 View  Result Date: 07/19/2016 CLINICAL DATA:  Shortness of Breath EXAM: CHEST 1 VIEW COMPARISON:  November 03, 2015 FINDINGS: There is mild scarring in the left base. There is no edema or consolidation. Heart is upper normal in size with pulmonary vascularity within normal limits. No adenopathy. There is atherosclerotic calcification in the aorta. No bone lesions. IMPRESSION: Mild scarring left base. No edema or consolidation. Aortic atherosclerosis.  Electronically Signed   By: Lowella Grip III M.D.   On: 07/19/2016 16:08    EKG:   Orders placed or performed during the hospital encounter of 07/19/16  . EKG 12-Lead  . EKG 12-Lead  . ED EKG  . ED EKG  . EKG 12-Lead  . EKG 12-Lead    IMPRESSION AND PLAN:   Elgene Coral  is a 80 y.o. female with a known history of COPD, chronic coronary artery disease and multiple other medical problems is presenting to the emergency department with shortness of breath. She was seen by her primary care provider yesterday and she was discharged with steroids and antibiotics for COPD exacerbation with no significant improvement. [name]. Feeling tight in her chest  #Acute respiratory failure secondary to COPD exacerbation Admit to stepdown unit On BiPAP Stat ABGs ordered Continue IV Solu-Medrol 80 mg, DuoNeb's and doxycycline Albuterol neb  treatments as needed Sputum culture and sensitivity ordered Pulmonology consult is placed and discussed with Dr.Ramchandran  #Elevated troponin could be from demand ischemia with history of coronary artery disease Monitor patient on telemetry and cycle troponins [facility] cardiology if needed patient sees Dr. Clayborn Bigness as an outpatient  . #Hyponatremia could be from dehydration from poor by mouth intake Provide hydration with IV fluids mentating fine  # CAD No chest pain at this time Troponin is elevated will monitor closely Currently patient is nothing by mouth as the patient is on BiPAP  #Peptic ulcer disease Protonix     All the records are reviewed and case discussed with ED provider. Management plans discussed with the patient, family and they are in agreement.  CODE STATUS: fc,daughter is the HCPOA  TOTAL CRITICAL CARE TIME TAKING CARE OF THIS PATIENT: 58mnutes.   Note: This dictation was prepared with Dragon dictation along with smaller phrase technology. Any transcriptional errors that  result from this process are unintentional.  GNicholes MangoM.D on 07/19/2016 at 5:29 PM  Between 7am to 6pm - Pager - [phone] After 6pm go to [email or web address] - password [email or web address] [facility]  Office  [phone] CC: Primary care physician; DVernie Murders PA

## 2016-07-19 NOTE — ED Provider Notes (Addendum)
[facility] Emergency Department Provider Note  ____________________________________________   First MD Initiated Contact with Patient 07/19/16 1532     (approximate)  I have reviewed the triage vital signs and the nursing notes.   HISTORY  Chief Complaint Respiratory Distress   HPI Reve Stephanie [name] is a 80 y.o. female With a history of COPD who is presenting to the emergency department with shortness of breath. She was at her 67 yesterday and was discharged with steroids as well as antibiotics for COPD exacerbation. However, over the past day it has worsened. She called EMS was placed on cpap when they arrived. She was 86% on room air on EMS arrival. She says that she feels a tightness in her chest and said that she has similar incident such as this 16 years ago. In route she was given 125 mg of Solu-Medrol. Also given 4 DuoNeb nebs en route. Says that she feels mildly improved.  Was only speaking in one-word fragments when EMS arrived.   Past Medical History:  Diagnosis Date  . Arthritis   . Complication of anesthesia   . COPD (chronic obstructive pulmonary disease) ([place])   . Coronary artery disease   . Depression   . Dyspnea   . Edema   . Pneumonia    IN PAST  . PONV (postoperative nausea and vomiting)   . Wheezing     Patient Active Problem List   Diagnosis Date Noted  . Depression 05/03/2016  . Anxiety 05/03/2016  . Mild dementia 04/19/2016  . COPD, moderate ([place]) 03/05/2015  . Anxiety and depression 03/04/2015  . Arteriosclerosis of coronary artery 03/04/2015  . CAFL (chronic airflow limitation) ([place]) 03/04/2015  . Breathlessness on exertion 03/04/2015  . HLD (hyperlipidemia) 03/04/2015  . Osteopenia 03/04/2015  . Peptic ulcer 03/04/2015  . Amnesia 03/20/2014  . Disordered sleep 03/20/2014  . Allergy to environmental factors 11/14/2013  . Gastroduodenal ulcer 11/14/2013  . GI bleed 11/14/2013    Past Surgical History:   Procedure Laterality Date  . BALLOON ANGIOPLASTY, ARTERY  05/17/2009  . CATARACT EXTRACTION W/PHACO Left 05/24/2016   Procedure: CATARACT EXTRACTION PHACO AND INTRAOCULAR LENS PLACEMENT (IOC);  Surgeon: Birder Robson, MD;  Location: [facility] ORS;  Service: Ophthalmology;  Laterality: Left;  Lot# [id number] H [place]:   00:52.3 AP%:   27.3 CDE:  14.26   . CATARACT EXTRACTION W/PHACO Right 06/14/2016   Procedure: CATARACT EXTRACTION PHACO AND INTRAOCULAR LENS PLACEMENT (IOC);  Surgeon: Birder Robson, MD;  Location: [facility] ORS;  Service: Ophthalmology;  Laterality: Right;  Lot# [id number] H [place]: 01:13.4 AP%: 24.4 CDE: 17.85  . CORONARY ANGIOPLASTY     STENT    Prior to Admission medications   Medication Sig Start Date End Date Taking? Authorizing Provider  acetaminophen (TYLENOL) 500 MG tablet Take 1 tablet by mouth at bedtime.     Historical Provider, MD  albuterol (VENTOLIN HFA) 108 (90 Base) MCG/ACT inhaler Inhale 2 puffs into the lungs every 6 (six) hours as needed. 09/22/15   Vickki Muff Chrismon, PA  aspirin 325 MG EC tablet Take 1 tablet by mouth at bedtime.     Historical Provider, MD  budesonide-formoterol (SYMBICORT) 160-4.5 MCG/ACT inhaler Inhale 2 puffs into the lungs 2 (two) times daily. 09/22/15   Vickki Muff Chrismon, PA  Calcium Carb-Cholecalciferol (CALCIUM 600+D) 600-800 MG-UNIT TABS Take 2 tablets by mouth daily.    Historical Provider, MD  clonazePAM (KLONOPIN) 0.5 MG tablet Take 1 tablet (0.5 mg total) by mouth 2 (two) times daily as  needed for anxiety. Patient taking differently: Take 0.5 mg by mouth 2 (two) times daily.  01/05/16   Birdie Sons, MD  Difluprednate (DUREZOL OP) Place 1 drop into the left eye 2 (two) times daily.    Historical Provider, MD  docusate sodium (COLACE) 100 MG capsule Take 200 mg by mouth at bedtime.    Historical Provider, MD  ibuprofen (ADVIL,MOTRIN) 200 MG tablet Take 400 mg by mouth daily as needed for headache or moderate pain.     Historical Provider, MD    levofloxacin (LEVAQUIN) 500 MG tablet Take 1 tablet (500 mg total) by mouth daily. 07/18/16   Vickki Muff Chrismon, PA  PARoxetine (PAXIL) 10 MG tablet TAKE TWO TABLETS BY MOUTH AT BEDTIME DAILY Patient taking differently: take 10 mgs at night daily 01/21/16   Vickki Muff Chrismon, PA  pravastatin (PRAVACHOL) 20 MG tablet Take 10 mg by mouth daily.  01/28/14   Historical Provider, MD  predniSONE (DELTASONE) 5 MG tablet Take 1 tablet (5 mg total) by mouth daily with breakfast. Taper down by one tablet daily (6,5,4,3,2,1) 07/18/16   Vickki Muff Chrismon, PA  tiotropium (SPIRIVA HANDIHALER) 18 MCG inhalation capsule Place 1 capsule (18 mcg total) into inhaler and inhale daily. 01/05/16   Birdie Sons, MD    Allergies Duloxetine hcl; Influenza vaccines; Other; and Venlafaxine  Family History  Problem Relation Age of Onset  . Hypertension Mother   . Diabetes Mother   . Colon cancer Mother     Social History Social History  Substance Use Topics  . Smoking status: Former Research scientist (life sciences)  . Smokeless tobacco: Never Used     Comment: QUIT IN 2002  . Alcohol use No    Review of Systems Constitutional: No fever/chills Eyes: No visual changes. ENT: No sore throat. Cardiovascular:  s above Respiratory: as above Gastrointestinal: No abdominal pain.  No nausea, no vomiting.  No diarrhea.  No constipation. Genitourinary: Negative for dysuria. Musculoskeletal: Negative for back pain. Skin: Negative for rash. Neurological: Negative for headaches, focal weakness or numbness.  10-point ROS otherwise negative.  ____________________________________________   PHYSICAL EXAM:  VITAL SIGNS: ED Triage Vitals  Enc Vitals Group     BP 07/19/16 1510 (!) 207/154     Pulse Rate 07/19/16 1510 (!) 124     Resp 07/19/16 1510 (!) 25     Temp --      Temp src --      SpO2 07/19/16 1508 (!) 86 %     Weight 07/19/16 1515 130 lb (59 kg)     Height 07/19/16 1515 '5\' 2"'$  (1.575 m)     Head Circumference --      Peak  Flow --      Pain Score --      Pain Loc --      Pain Edu? --      Excl. in [place]? --     Constitutional: Alert and oriented. Increased work of breathing but able speak in 3-4 sentences at this time through her CPAP. Eyes: Conjunctivae are normal. PERRL. EOMI. Head: Atraumatic. Nose: No congestion/rhinnorhea. Mouth/Throat: wearing CPAP. Neck: No stridor.   Cardiovascular: tachycardic, regular rhythm. Grossly normal heart sounds.   Respiratory:  Take with increased work of breathing with suprasternal retractions. Coarse wheezing throughout with decreased air movement and a prolonged expiratory phase. Gastrointestinal: Soft and nontender. No distention.  Musculoskeletal: No lower extremity tenderness nor edema.   Neurologic:  Normal speech and language. No gross focal neurologic deficits  are appreciated.  Skin:  Skin is warm, dry and intact. No rash noted. Psychiatric: Mood and affect are normal. Speech and behavior are normal.  ____________________________________________   LABS (all labs ordered are listed, but only abnormal results are displayed)  Labs Reviewed  CBC WITH DIFFERENTIAL/PLATELET - Abnormal; Notable for the following:       Result Value   Neutro Abs 7.3 (*)    Lymphs Abs 0.5 (*)    All other components within normal limits  BASIC METABOLIC PANEL - Abnormal; Notable for the following:    Sodium 127 (*)    Chloride 93 (*)    Glucose, Bld 140 (*)    BUN 27 (*)    Creatinine, Ser 1.14 (*)    GFR calc non Af Amer 45 (*)    GFR calc Af Amer 52 (*)    All other components within normal limits  TROPONIN I - Abnormal; Notable for the following:    Troponin I 0.13 (*)    All other components within normal limits  BRAIN NATRIURETIC PEPTIDE   ____________________________________________  EKG  ED ECG REPORT I, Doran Stabler, the attending physician, personally viewed and interpreted this ECG.   Date: 07/19/2016  EKG Time: 1508  Rate: 125  Rhythm: sinus  tachycardia  Axis: axis deviation  Intervals:Prolonged QT interval  ST&T Change: no ST segment elevation. Lateral ST depression of about 1 mm in V4 through 6but this could also just be a baselinewander. No T-wave inversions.  ____________________________________________  [id number] DG Chest 1 View (Final result)  Result time 07/19/16 16:08:54  Final result by Lowella Grip III, MD (07/19/16 16:08:54)           Narrative:   CLINICAL DATA: Shortness of Breath  EXAM: CHEST 1 VIEW  COMPARISON: November 03, 2015  FINDINGS: There is mild scarring in the left base. There is no edema or consolidation. Heart is upper normal in size with pulmonary vascularity within normal limits. No adenopathy. There is atherosclerotic calcification in the aorta. No bone lesions.  IMPRESSION: Mild scarring left base. No edema or consolidation. Aortic atherosclerosis.   Electronically Signed By: Lowella Grip III M.D. On: 07/19/2016 16:08           ____________________________________________   PROCEDURES  Procedure(s) performed:   Procedures  Critical Care performed:  CRITICAL CARE Performed by: Doran Stabler   Total critical care time: 35 minutes  Critical care time was exclusive of separately billable procedures and treating other patients.  Critical care was necessary to treat or prevent imminent or life-threatening deterioration.  Critical care was time spent personally by me on the following activities: development of treatment plan with patient and/or surrogate as well as nursing, discussions with consultants, evaluation of patient's response to treatment, examination of patient, obtaining history from patient or surrogate, ordering and performing treatments and interventions, ordering and review of laboratory studies, ordering and review of radiographic studies, pulse oximetry and re-evaluation of patient's  condition.   ____________________________________________   INITIAL IMPRESSION / ASSESSMENT AND PLAN / ED COURSE  Pertinent labs & imaging results that were available during my care of the patient were reviewed by me and considered in my medical decision making (see chart for details).   Clinical Course   ----------------------------------------- 4:16 PM on 07/19/2016 -----------------------------------------  Patient improving and now speaking in 4-5 word sentences. Still with coarse wheezing but with decreased respiratory distress and overall appears calmer and more relaxed. Plan to admit patient to the  hospital. Signed out to Dr. Genia Harold of the medicine service. Explained this plan to the patient and she is understanding and willing to comply.   ____________________________________________   FINAL CLINICAL IMPRESSION(S) / ED DIAGNOSES  COPD exacerbation.    NEW MEDICATIONS STARTED DURING THIS VISIT:  New Prescriptions   No medications on file     Note:  This document was prepared using Dragon voice recognition software and may include unintentional dictation errors.    Orbie Pyo, MD 07/19/16 1616  Elevated troponin.  Feel this is most likely related to demand.    Orbie Pyo, MD 07/19/16 [phone]

## 2016-07-20 DIAGNOSIS — R062 Wheezing: Secondary | ICD-10-CM | POA: Diagnosis not present

## 2016-07-20 DIAGNOSIS — J9601 Acute respiratory failure with hypoxia: Secondary | ICD-10-CM | POA: Diagnosis not present

## 2016-07-20 DIAGNOSIS — E871 Hypo-osmolality and hyponatremia: Secondary | ICD-10-CM

## 2016-07-20 DIAGNOSIS — R748 Abnormal levels of other serum enzymes: Secondary | ICD-10-CM | POA: Diagnosis not present

## 2016-07-20 DIAGNOSIS — J9600 Acute respiratory failure, unspecified whether with hypoxia or hypercapnia: Secondary | ICD-10-CM | POA: Diagnosis not present

## 2016-07-20 DIAGNOSIS — J441 Chronic obstructive pulmonary disease with (acute) exacerbation: Secondary | ICD-10-CM | POA: Diagnosis not present

## 2016-07-20 DIAGNOSIS — J449 Chronic obstructive pulmonary disease, unspecified: Secondary | ICD-10-CM | POA: Diagnosis not present

## 2016-07-20 LAB — BLOOD GAS, ARTERIAL
ACID-BASE EXCESS: 3 mmol/L — AB (ref 0.0–2.0)
ACID-BASE EXCESS: 0.9 mmol/L (ref 0.0–2.0)
Allens test (pass/fail): POSITIVE — AB
BICARBONATE: 27.9 mmol/L (ref 20.0–28.0)
BICARBONATE: 26.6 mmol/L (ref 20.0–28.0)
Delivery systems: POSITIVE
EXPIRATORY PAP: 6
FIO2: 0.3
FIO2: 0.36
Inspiratory PAP: 12
O2 SAT: 93.8 %
O2 Saturation: 97.8 %
PATIENT TEMPERATURE: 37
PATIENT TEMPERATURE: 37
PO2 ART: 99 mmHg (ref 83.0–108.0)
PO2 ART: 72 mmHg — AB (ref 83.0–108.0)
pCO2 arterial: 43 mmHg (ref 32.0–48.0)
pCO2 arterial: 46 mmHg (ref 32.0–48.0)
pH, Arterial: 7.42 (ref 7.350–7.450)
pH, Arterial: 7.37 (ref 7.350–7.450)

## 2016-07-20 LAB — BASIC METABOLIC PANEL
ANION GAP: 8 (ref 5–15)
BUN: 26 mg/dL — AB (ref 6–20)
CHLORIDE: 96 mmol/L — AB (ref 101–111)
CO2: 25 mmol/L (ref 22–32)
Calcium: 9.9 mg/dL (ref 8.9–10.3)
Creatinine, Ser: 1.02 mg/dL — ABNORMAL HIGH (ref 0.44–1.00)
GFR calc Af Amer: 59 mL/min — ABNORMAL LOW (ref >60)
GFR calc non Af Amer: 51 mL/min — ABNORMAL LOW (ref >60)
Glucose, Bld: 115 mg/dL — ABNORMAL HIGH (ref 65–99)
POTASSIUM: 4.2 mmol/L (ref 3.5–5.1)
SODIUM: 129 mmol/L — AB (ref 135–145)

## 2016-07-20 LAB — CBC
HEMATOCRIT: 34.6 % — AB (ref 35.0–47.0)
HEMOGLOBIN: 12 g/dL (ref 12.0–16.0)
MCH: 31.3 pg (ref 26.0–34.0)
MCHC: 34.6 g/dL (ref 32.0–36.0)
MCV: 90.5 fL (ref 80.0–100.0)
Platelets: 204 10*3/uL (ref 150–440)
RBC: 3.83 MIL/uL (ref 3.80–5.20)
RDW: 14 % (ref 11.5–14.5)
WBC: 7.6 10*3/uL (ref 3.6–11.0)

## 2016-07-20 LAB — TROPONIN I
TROPONIN I: 0.09 ng/mL — AB (ref <0.03)
Troponin I: 0.11 ng/mL (ref <0.03)

## 2016-07-20 MED ORDER — METHYLPREDNISOLONE SODIUM SUCC 125 MG IJ SOLR
60.0000 mg | Freq: Three times a day (TID) | INTRAMUSCULAR | Status: AC
  Administered 2016-07-20 (×3): 60 mg via INTRAVENOUS
  Filled 2016-07-20 (×3): qty 2

## 2016-07-20 MED ORDER — IPRATROPIUM-ALBUTEROL 0.5-2.5 (3) MG/3ML IN SOLN
3.0000 mL | RESPIRATORY_TRACT | Status: DC
  Administered 2016-07-20 – 2016-07-23 (×19): 3 mL via RESPIRATORY_TRACT
  Filled 2016-07-20 (×18): qty 3

## 2016-07-20 MED ORDER — PANTOPRAZOLE SODIUM 40 MG IV SOLR
40.0000 mg | INTRAVENOUS | Status: DC
  Administered 2016-07-20: 40 mg via INTRAVENOUS
  Filled 2016-07-20 (×2): qty 40

## 2016-07-20 MED ORDER — DOXYCYCLINE HYCLATE 100 MG PO TABS
100.0000 mg | ORAL_TABLET | Freq: Two times a day (BID) | ORAL | Status: AC
  Administered 2016-07-20 – 2016-07-24 (×9): 100 mg via ORAL
  Filled 2016-07-20 (×9): qty 1

## 2016-07-20 MED ORDER — PREDNISONE 20 MG PO TABS
20.0000 mg | ORAL_TABLET | Freq: Every day | ORAL | Status: DC
  Administered 2016-07-25: 20 mg via ORAL
  Filled 2016-07-20: qty 1

## 2016-07-20 MED ORDER — IPRATROPIUM-ALBUTEROL 0.5-2.5 (3) MG/3ML IN SOLN
3.0000 mL | RESPIRATORY_TRACT | Status: DC | PRN
  Administered 2016-07-23: 19:00:00 3 mL via RESPIRATORY_TRACT

## 2016-07-20 MED ORDER — BUDESONIDE 0.5 MG/2ML IN SUSP
0.5000 mg | Freq: Two times a day (BID) | RESPIRATORY_TRACT | Status: DC
  Administered 2016-07-20 – 2016-07-25 (×11): 0.5 mg via RESPIRATORY_TRACT
  Filled 2016-07-20 (×11): qty 2

## 2016-07-20 MED ORDER — IPRATROPIUM-ALBUTEROL 0.5-2.5 (3) MG/3ML IN SOLN
RESPIRATORY_TRACT | Status: AC
  Administered 2016-07-20: 3 mL
  Filled 2016-07-20: qty 3

## 2016-07-20 MED ORDER — PREDNISONE 20 MG PO TABS
40.0000 mg | ORAL_TABLET | Freq: Every day | ORAL | Status: AC
  Administered 2016-07-21 – 2016-07-22 (×2): 40 mg via ORAL
  Filled 2016-07-20 (×2): qty 2

## 2016-07-20 MED ORDER — PREDNISONE 5 MG PO TABS: 10.0000 mg | ORAL_TABLET | Freq: Every day | ORAL | Status: DC

## 2016-07-20 MED ORDER — PREDNISONE 5 MG PO TABS
30.0000 mg | ORAL_TABLET | Freq: Every day | ORAL | Status: AC
  Administered 2016-07-23 – 2016-07-24 (×2): 30 mg via ORAL
  Filled 2016-07-20 (×2): qty 1

## 2016-07-20 NOTE — Progress Notes (Signed)
[facility] NAME: Stephanie [name]    MR#:  [id number]  DATE OF BIRTH:  1936-02-02  SUBJECTIVE:  CHIEF COMPLAINT:   Chief Complaint  Patient presents with  . Respiratory Distress  Struggling to breathe using accessory muscles of respirations  REVIEW OF SYSTEMS:  Review of Systems  Constitutional: Positive for malaise/fatigue. Negative for chills, fever and weight loss.  HENT: Negative for nosebleeds and sore throat.   Eyes: Negative for blurred vision.  Respiratory: Positive for shortness of breath and wheezing. Negative for cough.   Cardiovascular: Negative for chest pain, orthopnea, leg swelling and PND.  Gastrointestinal: Negative for abdominal pain, constipation, diarrhea, heartburn, nausea and vomiting.  Genitourinary: Negative for dysuria and urgency.  Musculoskeletal: Negative for back pain.  Skin: Negative for rash.  Neurological: Positive for weakness. Negative for dizziness, speech change, focal weakness and headaches.  Endo/Heme/Allergies: Does not bruise/bleed easily.  Psychiatric/Behavioral: Negative for depression.    DRUG ALLERGIES:   Allergies  Allergen Reactions  . Duloxetine Hcl   . Influenza Vaccines   . Other     STEROIDS  DON'T WORK FOR HER  . Venlafaxine     GI distress   VITALS:  Blood pressure (!) 152/81, pulse (!) 105, temperature 98 F (36.7 C), temperature source Axillary, resp. rate (!) 21, height '5\' 4"'$  (1.626 m), weight 57.9 kg (127 lb 10.3 oz), SpO2 95 %. PHYSICAL EXAMINATION:  Physical Exam  Constitutional: She is oriented to person, place, and time and well-developed, well-nourished, and in no distress.  HENT:  Head: Normocephalic and atraumatic.  Eyes: Conjunctivae and EOM are normal. Pupils are equal, round, and reactive to light.  Neck: Normal range of motion. Neck supple. No tracheal deviation present. No thyromegaly present.  Cardiovascular: Normal rate, regular rhythm and  normal heart sounds.   Pulmonary/Chest: Accessory muscle usage present. She is in respiratory distress. She has decreased breath sounds in the right lower field and the left lower field. She has wheezes. She exhibits no tenderness.  Abdominal: Soft. Bowel sounds are normal. She exhibits no distension. There is no tenderness.  Musculoskeletal: Normal range of motion.  Neurological: She is alert and oriented to person, place, and time. No cranial nerve deficit.  Skin: Skin is warm and dry. No rash noted.  Psychiatric: Mood and affect normal.   LABORATORY PANEL:   CBC  Recent Labs Lab 07/20/16 0642  WBC 7.6  HGB 12.0  HCT 34.6*  PLT 204   ------------------------------------------------------------------------------------------------------------------ Chemistries   Recent Labs Lab 07/20/16 0642  NA 129*  K 4.2  CL 96*  CO2 25  GLUCOSE 115*  BUN 26*  CREATININE 1.02*  CALCIUM 9.9   RADIOLOGY:  No results found. ASSESSMENT AND PLAN:  Stephanie [name]  is a 80 y.o. female with a known history of COPD, chronic coronary artery disease and multiple other medical problems is presenting to the emergency department with shortness of breath. She was seen by her primary care provider yesterday and she was discharged with steroids and antibiotics for COPD exacerbation with no significant improvement. [name]. Feeling tight in her chest  #Acute respiratory failure secondary to COPD exacerbation - BiPAP per Intensivist Stat ABGs ordered Continue IV Solu-Medrol 80 mg, DuoNeb's and doxycycline Albuterol neb  treatments as needed Sputum culture and sensitivity ordered - Intensivist following  #Elevated troponin could be from demand ischemia with history of coronary artery disease -  Serial troponins negative  . #Hyponatremia - possibly from dehydration from poor by mouth intake - continue hydration with IV fluids -Monitor   # CAD No chest  pain at this time  #Peptic ulcer disease Protonix     All the records are reviewed and case discussed with Care Management/Social Worker. Management plans discussed with the patient, family and they are in agreement.  CODE STATUS: Full code  TOTAL TIME (critical care) TAKING CARE OF THIS PATIENT: 35 minutes.   More than 50% of the time was spent in counseling/coordination of care: YES  POSSIBLE D/C IN 1-2  DAYS, DEPENDING ON CLINICAL CONDITION.   Max Sane M.D on 07/20/2016 at 5:36 PM  Between 7am to 6pm - Pager - [phone]  After 6pm go to [email or web address] - Proofreader  [facility]  Office  [phone]  CC: Primary care physician; Vernie Murders, PA  Note: This dictation was prepared with Dragon dictation along with smaller phrase technology. Any transcriptional errors that result from this process are unintentional.

## 2016-07-20 NOTE — Progress Notes (Signed)
Patient on and off bipap during shift, able to tolerate 4L [place] for short periods of time. On afternoon assessment patient's R eye appeared to be swollen, per patient's daughter cataract surgery had been preformed recently, but patient was not using any eye drops currently. Dr. Manuella Ghazi notified and stated he would assess. Wilnette Kales

## 2016-07-20 NOTE — Progress Notes (Signed)
PULMONARY / CRITICAL CARE MEDICINE   Name: Stephanie [name] MRN: [id number] DOB: [birth year]    ADMISSION DATE:  07/19/2016  REFERRING MD :  Dr. Margaretmary Eddy  CHIEF COMPLAINT:  Dyspnea.   HISTORY OF PRESENT ILLNESS:  The patient is a 80 yo female with a history of COPD, who sees Dr. Raul Del as her pulmonary doctor. She is on spiriva and symbicort. She also has a history of GI bleed, PUD, and previous history of smoking.  She presents now with gradually worsening dyspnea and cough with wheezing. She was given a Z pack last week with no relief. Here in the ED she was put on bipap.  Currently she is speaking on bipap and does not appear to be in respiratory distress. Her daughters are at bedside and note that she is looking much better than a few hours ago.   Review of CXR image shows hyperinflation. Labs studies show hyponatremia with CKD mild troponin bump of 0.13, otherwise unremarkable thus far.  ABG reviewed; on bipap, appears compensated, no hypercapnia.    SUBJECTIVE:  Doing better this AM, but still with dyspnea when talking, and wheezes on exam. Also, asking for bipap at times.    VITAL SIGNS: Temp:  [96.6 F (35.9 C)-98.3 F (36.8 C)] 98.3 F (36.8 C) (12/13 0800) Pulse Rate:  [93-124] 98 (12/13 0700) Resp:  [14-25] 19 (12/13 0700) BP: (125-212)/(58-154) 130/71 (12/13 0700) SpO2:  [86 %-100 %] 96 % (12/13 0700) FiO2 (%):  [30 %] 30 % (12/13 0200) Weight:  [127 lb 10.3 oz (57.9 kg)-130 lb (59 kg)] 127 lb 10.3 oz (57.9 kg) (12/12 2110) HEMODYNAMICS:   VENTILATOR SETTINGS: FiO2 (%):  [30 %] 30 % INTAKE / OUTPUT:  Intake/Output Summary (Last 24 hours) at 07/20/16 1214 Last data filed at 07/20/16 0700  Gross per 24 hour  Intake           1752.5 ml  Output                0 ml  Net           1752.5 ml    Review of Systems  Constitutional: Negative for chills and fever.  HENT: Negative for hearing loss.   Eyes: Negative for blurred vision.  Respiratory: Positive  for cough, sputum production, shortness of breath and wheezing. Negative for hemoptysis.   Cardiovascular: Negative for chest pain and palpitations.  Gastrointestinal: Negative for heartburn and nausea.  Genitourinary: Negative for dysuria.  Musculoskeletal: Negative for myalgias.  Skin: Negative for rash.  Endo/Heme/Allergies: Does not bruise/bleed easily.    Physical Exam  Constitutional: She is oriented to person, place, and time and well-developed, well-nourished, and in no distress.  HENT:  Head: Normocephalic and atraumatic.  Right Ear: External ear normal.  Left Ear: External ear normal.  Neck: Neck supple.  Cardiovascular: Normal rate and regular rhythm.   Pulmonary/Chest:  No sig distress Mild faint diffuse exp wheezes Dec BS at the bases  Abdominal: Soft. Bowel sounds are normal. She exhibits no distension. There is no tenderness.  Musculoskeletal: Normal range of motion. She exhibits no edema.  Neurological: She is alert and oriented to person, place, and time.  Skin: Skin is warm and dry. No erythema.  Nursing note and vitals reviewed.    LABS:  CBC  Recent Labs Lab 07/19/16 1510 07/19/16 2200 07/20/16 0642  WBC 8.4 6.3 7.6  HGB 12.1 11.8* 12.0  HCT 35.9 34.5* 34.6*  PLT 198 178 204   Coag's  No results for input(s): APTT, INR in the last 168 hours. BMET  Recent Labs Lab 07/19/16 1510 07/20/16 0642  NA 127* 129*  K 4.4 4.2  CL 93* 96*  CO2 25 25  BUN 27* 26*  CREATININE 1.14* 1.02*  GLUCOSE 140* 115*   Electrolytes  Recent Labs Lab 07/19/16 1510 07/20/16 0642  CALCIUM 9.4 9.9   Sepsis Markers No results for input(s): LATICACIDVEN, PROCALCITON, O2SATVEN in the last 168 hours. ABG  Recent Labs Lab 07/19/16 1704 07/20/16 0430  PHART 7.37 7.42  PCO2ART 43 43  PO2ART 84 99   Liver Enzymes No results for input(s): AST, ALT, ALKPHOS, BILITOT, ALBUMIN in the last 168 hours. Cardiac Enzymes  Recent Labs Lab 07/19/16 1510  07/19/16 2200 07/20/16 0642  TROPONINI 0.13* 0.13* 0.11*   Glucose  Recent Labs Lab 07/19/16 2112  GLUCAP 118*    Imaging Dg Chest 1 View  Result Date: 07/19/2016 CLINICAL DATA:  Shortness of Breath EXAM: CHEST 1 VIEW COMPARISON:  November 03, 2015 FINDINGS: There is mild scarring in the left base. There is no edema or consolidation. Heart is upper normal in size with pulmonary vascularity within normal limits. No adenopathy. There is atherosclerotic calcification in the aorta. No bone lesions. IMPRESSION: Mild scarring left base. No edema or consolidation. Aortic atherosclerosis. Electronically Signed   By: Lowella Grip III M.D.   On: 07/19/2016 16:08    LINES:   CULTURES:   ANTIBIOTICS  ASSESSMENT / PLAN: 80 yo female with emphysema, now with AECOPD.   PULMONARY A:Acute hypoxic respiratory failure due to acute bronchitis with AECOPD.  P:   --Continue empiric abx for bronchitis.  --Check flu. > negative --transition to PO steroid in the AM, '40mg'$  taper over 2 weeks --add pulimort BID x 3 days --intermittent bipap during the day, bipap QHS while inpatient.   CARDIOVASCULAR A: Slight increase in troponin. Likely secondary to above.  P:  --Trend troponin.   RENAL A:  CKD 2 Hyponatremia, suspect dehydration P:   --Continue IVF.  --cont with PO intake  GASTROINTESTINAL A:  -- P:   Gi prophylaxis.   HEMATOLOGIC A:  -- P:  --  INFECTIOUS A:  -- P:    Micro/culture results:  BCx2 -- UC -- Sputum--  Antibiotics:   ENDOCRINE A:   P:   Monitor glucose  NEUROLOGIC A:  -- P:   RASS goal: --   Thank you for consulting [facility], Please feel free to contacts [place] with any questions at [phone] (please enter 7-digits).  I have personally obtained a history, examined the patient, evaluated laboratory and imaging results, formulated the assessment and plan and placed orders.  Critical Care Time devoted  to patient care services described in this note is 35 minutes.     Vilinda Boehringer, MD [facility] Pager [phone] (please enter 7-digits) On Call Pager [phone] (please enter 7-digits)  Note: This note was prepared with Dragon dictation along with smaller phrase technology. Any transcriptional errors that result from this process are unintentional.

## 2016-07-20 NOTE — Progress Notes (Signed)
CH received an order for a Pt in the [facility], who had requested prayer. [place] visited th Pt this morning, but the Pt was asleep. CH offered silent prayers for the Pt and then left. A pastoral care follow-up is necessary for the Pt at some point later in the day.    07/20/16 0700  Clinical Encounter Type  Visited With Patient  Visit Type Initial;Spiritual support  Referral From Nurse  Consult/Referral To Chaplain  Spiritual Encounters  Spiritual Needs Prayer

## 2016-07-21 ENCOUNTER — Inpatient Hospital Stay: Payer: PPO

## 2016-07-21 DIAGNOSIS — R062 Wheezing: Secondary | ICD-10-CM | POA: Diagnosis not present

## 2016-07-21 DIAGNOSIS — J449 Chronic obstructive pulmonary disease, unspecified: Secondary | ICD-10-CM | POA: Diagnosis not present

## 2016-07-21 DIAGNOSIS — R748 Abnormal levels of other serum enzymes: Secondary | ICD-10-CM | POA: Diagnosis not present

## 2016-07-21 DIAGNOSIS — J9601 Acute respiratory failure with hypoxia: Secondary | ICD-10-CM | POA: Diagnosis not present

## 2016-07-21 DIAGNOSIS — J9690 Respiratory failure, unspecified, unspecified whether with hypoxia or hypercapnia: Secondary | ICD-10-CM | POA: Diagnosis not present

## 2016-07-21 DIAGNOSIS — J441 Chronic obstructive pulmonary disease with (acute) exacerbation: Secondary | ICD-10-CM | POA: Diagnosis not present

## 2016-07-21 DIAGNOSIS — E871 Hypo-osmolality and hyponatremia: Secondary | ICD-10-CM | POA: Diagnosis not present

## 2016-07-21 LAB — BASIC METABOLIC PANEL
Anion gap: 6 (ref 5–15)
BUN: 35 mg/dL — ABNORMAL HIGH (ref 6–20)
CHLORIDE: 97 mmol/L — AB (ref 101–111)
CO2: 25 mmol/L (ref 22–32)
Calcium: 9.1 mg/dL (ref 8.9–10.3)
Creatinine, Ser: 1.2 mg/dL — ABNORMAL HIGH (ref 0.44–1.00)
GFR calc Af Amer: 49 mL/min — ABNORMAL LOW (ref >60)
GFR calc non Af Amer: 42 mL/min — ABNORMAL LOW (ref >60)
GLUCOSE: 126 mg/dL — AB (ref 65–99)
POTASSIUM: 4.4 mmol/L (ref 3.5–5.1)
Sodium: 128 mmol/L — ABNORMAL LOW (ref 135–145)

## 2016-07-21 LAB — CBC
HEMATOCRIT: 32.9 % — AB (ref 35.0–47.0)
HEMOGLOBIN: 11.2 g/dL — AB (ref 12.0–16.0)
MCH: 30.9 pg (ref 26.0–34.0)
MCHC: 34.2 g/dL (ref 32.0–36.0)
MCV: 90.6 fL (ref 80.0–100.0)
Platelets: 197 10*3/uL (ref 150–440)
RBC: 3.64 MIL/uL — AB (ref 3.80–5.20)
RDW: 14.2 % (ref 11.5–14.5)
WBC: 8.1 10*3/uL (ref 3.6–11.0)

## 2016-07-21 MED ORDER — CLONAZEPAM 0.5 MG PO TABS
0.5000 mg | ORAL_TABLET | Freq: Two times a day (BID) | ORAL | Status: DC
  Administered 2016-07-21 – 2016-07-25 (×9): 0.5 mg via ORAL
  Filled 2016-07-21 (×8): qty 1

## 2016-07-21 MED ORDER — PANTOPRAZOLE SODIUM 40 MG PO TBEC
40.0000 mg | DELAYED_RELEASE_TABLET | Freq: Two times a day (BID) | ORAL | Status: DC
  Administered 2016-07-21 – 2016-07-25 (×8): 40 mg via ORAL
  Filled 2016-07-21 (×8): qty 1

## 2016-07-21 NOTE — Progress Notes (Addendum)
PULMONARY / CRITICAL CARE MEDICINE   Name: Stephanie [name] MRN: [id number] DOB: [birth year]    ADMISSION DATE:  07/19/2016  REFERRING MD :  Dr. Margaretmary Eddy  CHIEF COMPLAINT:  Dyspnea.   HISTORY OF PRESENT ILLNESS:  The patient is a 80 yo female with a history of COPD, who sees Dr. Raul Del as her pulmonary doctor. She is on spiriva and symbicort. She also has a history of GI bleed, PUD, and previous history of smoking.  She presents now with gradually worsening dyspnea and cough with wheezing. She was given a Z pack last week with no relief. Here in the ED she was put on bipap.  Currently she is speaking on bipap and does not appear to be in respiratory distress. Her daughters are at bedside and note that she is looking much better than a few hours ago.   Review of CXR image shows hyperinflation. Labs studies show hyponatremia with CKD mild troponin bump of 0.13, otherwise unremarkable thus far.  ABG reviewed; on bipap, appears compensated, no hypercapnia.    SUBJECTIVE:  Doing better this AM, but still with dyspnea when talking, and wheezes on exam. Slow improvement  VITAL SIGNS: Temp:  [97.8 F (36.6 C)-98.1 F (36.7 C)] 97.9 F (36.6 C) (12/14 0400) Pulse Rate:  [93-124] 95 (12/14 0600) Resp:  [14-23] 14 (12/14 0600) BP: (113-189)/(51-102) 130/51 (12/14 0600) SpO2:  [84 %-96 %] 94 % (12/14 0600) FiO2 (%):  [30 %] 30 % (12/14 0600) HEMODYNAMICS:   VENTILATOR SETTINGS: FiO2 (%):  [30 %] 30 % INTAKE / OUTPUT:  Intake/Output Summary (Last 24 hours) at 07/21/16 0841 Last data filed at 07/21/16 0645  Gross per 24 hour  Intake             1157 ml  Output              600 ml  Net              557 ml    Review of Systems  Constitutional: Negative for chills and fever.  HENT: Negative for hearing loss.   Eyes: Negative for blurred vision.  Respiratory: Positive for cough, shortness of breath and wheezing. Negative for hemoptysis and sputum production.   Cardiovascular:  Negative for chest pain and palpitations.  Gastrointestinal: Negative for heartburn and nausea.  Genitourinary: Negative for dysuria.  Musculoskeletal: Negative for myalgias.  Skin: Negative for rash.  Endo/Heme/Allergies: Does not bruise/bleed easily.    Physical Exam  Constitutional: She is oriented to person, place, and time and well-developed, well-nourished, and in no distress.  HENT:  Head: Normocephalic and atraumatic.  Right Ear: External ear normal.  Left Ear: External ear normal.  Neck: Neck supple.  Cardiovascular: Normal rate and regular rhythm.   Pulmonary/Chest:  No sig distress Mild faint diffuse exp wheezes Dec BS at the bases  Abdominal: Soft. Bowel sounds are normal. She exhibits no distension. There is no tenderness.  Musculoskeletal: Normal range of motion. She exhibits no edema.  Neurological: She is alert and oriented to person, place, and time.  Skin: Skin is warm and dry. No erythema.  Nursing note and vitals reviewed.    LABS:  CBC  Recent Labs Lab 07/19/16 2200 07/20/16 0642 07/21/16 0443  WBC 6.3 7.6 8.1  HGB 11.8* 12.0 11.2*  HCT 34.5* 34.6* 32.9*  PLT 178 204 197   Coag's No results for input(s): APTT, INR in the last 168 hours. BMET  Recent Labs Lab 07/19/16 1510 07/20/16 1017 07/21/16  0443  NA 127* 129* 128*  K 4.4 4.2 4.4  CL 93* 96* 97*  CO2 '25 25 25  '$ BUN 27* 26* 35*  CREATININE 1.14* 1.02* 1.20*  GLUCOSE 140* 115* 126*   Electrolytes  Recent Labs Lab 07/19/16 1510 07/20/16 0642 07/21/16 0443  CALCIUM 9.4 9.9 9.1   Sepsis Markers No results for input(s): LATICACIDVEN, PROCALCITON, O2SATVEN in the last 168 hours. ABG  Recent Labs Lab 07/19/16 1704 07/20/16 0430 07/20/16 1611  PHART 7.37 7.42 7.37  PCO2ART 43 43 46  PO2ART 84 99 72*   Liver Enzymes No results for input(s): AST, ALT, ALKPHOS, BILITOT, ALBUMIN in the last 168 hours. Cardiac Enzymes  Recent Labs Lab 07/19/16 2200 07/20/16 0642  07/20/16 1250  TROPONINI 0.13* 0.11* 0.09*   Glucose  Recent Labs Lab 07/19/16 2112  GLUCAP 118*    Imaging No results found.  LINES:   CULTURES:   ANTIBIOTICS  ASSESSMENT / PLAN: 80 yo female with emphysema, now with AECOPD.   PULMONARY A:Acute hypoxic respiratory failure due to acute bronchitis with AECOPD.  COPD Stage III P:   --Continue empiric abx for bronchitis.  --Check flu. > negative --transition to PO steroid in the AM, '40mg'$  taper over 2 weeks --add pulimort BID x 3 days --intermittent bipap during the day, bipap QHS while inpatient.  --given her level of copd exacerbation, she will take a bit longer to improve, in the interm will cont with intermittent BIpap and bridge with HFNC --CXR today --Txn to Dr. Raul Del, once out of the ICU.   CARDIOVASCULAR A: Slight increase in troponin. Likely secondary to above.  P:  --Trend troponin.   RENAL A:  CKD 2 Hyponatremia, suspect dehydration P:   --Continue IVF.  --cont with PO intake  GASTROINTESTINAL A:  -- P:   Gi prophylaxis.   HEMATOLOGIC A:  -- P:  --  INFECTIOUS A:  -- P:    Micro/culture results:  BCx2 -- UC -- Sputum--  Antibiotics:   ENDOCRINE A:   P:   Monitor glucose  NEUROLOGIC A:  -- P:   RASS goal: --   Thank you for consulting [facility], Please feel free to contacts [place] with any questions at [phone] (please enter 7-digits).  I have personally obtained a history, examined the patient, evaluated laboratory and imaging results, formulated the assessment and plan and placed orders.  Critical Care Time devoted to patient care services described in this note is 35 minutes.     Vilinda Boehringer, MD [facility] Pager [phone] (please enter 7-digits) On Call Pager [phone] (please enter 7-digits)  Note: This note was prepared with Dragon dictation along with smaller phrase technology. Any  transcriptional errors that result from this process are unintentional.

## 2016-07-21 NOTE — Progress Notes (Signed)
Dr. Manuella Ghazi present and gave order for patient to have regular diet if she can tolerate being off bipap long enough to eat.  Plan of care discussed and patient will stay as Stepdown patient.

## 2016-07-21 NOTE — Progress Notes (Signed)
Pt remained on Bipap at 30% for a majority of the shift, resting comfortably.  At 0330, patient pulled the Bipap mask off her face stating she needed to go to the bathroom with urgency.  Placed patient on 4L [place] and attempted to assist patient to [facility].  Patient became short of breath with audible wheezing and congestion with a dry cough.  O2 sats dropped to 84 and HR went up to 122bpm.  Pt assisted back to bed and placed back on Bipap with O2 sats returning to 93%.  Pt called out at 0630, pulling off mask again and taken to [facility] by NT with similar results as mentioned above.  Upon returning to bed and being placed back on Bipap, O2 sats returned to 95%.  Will continue to assess and monitor.

## 2016-07-21 NOTE — Progress Notes (Signed)
Stephanie [name] at [place] NAME: Stephanie [name]    MR#:  [id number]  DATE OF BIRTH:  [birth year]  SUBJECTIVE:  CHIEF COMPLAINT:   Chief Complaint  Patient presents with  . Respiratory Distress  Struggling to breathe using accessory muscles of respirations, requiring Bipap most times of the day, desats easily off BiPAP  REVIEW OF SYSTEMS:  Review of Systems  Constitutional: Positive for malaise/fatigue. Negative for chills, fever and weight loss.  HENT: Negative for nosebleeds and sore throat.   Eyes: Negative for blurred vision.  Respiratory: Positive for shortness of breath and wheezing. Negative for cough.   Cardiovascular: Negative for chest pain, orthopnea, leg swelling and PND.  Gastrointestinal: Negative for abdominal pain, constipation, diarrhea, heartburn, nausea and vomiting.  Genitourinary: Negative for dysuria and urgency.  Musculoskeletal: Negative for back pain.  Skin: Negative for rash.  Neurological: Positive for weakness. Negative for dizziness, speech change, focal weakness and headaches.  Endo/Heme/Allergies: Does not bruise/bleed easily.  Psychiatric/Behavioral: Negative for depression.    DRUG ALLERGIES:   Allergies  Allergen Reactions  . Duloxetine Hcl   . Influenza Vaccines   . Other     STEROIDS  DON'T WORK FOR HER  . Venlafaxine     GI distress   VITALS:  Blood pressure (!) 149/85, pulse 87, temperature 97.6 F (36.4 C), temperature source Axillary, resp. rate 16, height '5\' 4"'$  (1.626 m), weight 57.9 kg (127 lb 10.3 oz), SpO2 93 %. PHYSICAL EXAMINATION:  Physical Exam  Constitutional: She is oriented to person, place, and time and well-developed, well-nourished, and in no distress.  HENT:  Head: Normocephalic and atraumatic.  Eyes: Conjunctivae and EOM are normal. Pupils are equal, round, and reactive to light.  Neck: Normal range of motion. Neck supple. No tracheal deviation present. No thyromegaly  present.  Cardiovascular: Normal rate, regular rhythm and normal heart sounds.   Pulmonary/Chest: Accessory muscle usage present. She is in respiratory distress. She has decreased breath sounds in the right lower field and the left lower field. She has wheezes. She exhibits no tenderness.  Abdominal: Soft. Bowel sounds are normal. She exhibits no distension. There is no tenderness.  Musculoskeletal: Normal range of motion.  Neurological: She is alert and oriented to person, place, and time. No cranial nerve deficit.  Skin: Skin is warm and dry. No rash noted.  Psychiatric: Mood and affect normal.   LABORATORY PANEL:   CBC  Recent Labs Lab 07/21/16 0443  WBC 8.1  HGB 11.2*  HCT 32.9*  PLT 197   ------------------------------------------------------------------------------------------------------------------ Chemistries   Recent Labs Lab 07/21/16 0443  NA 128*  K 4.4  CL 97*  CO2 25  GLUCOSE 126*  BUN 35*  CREATININE 1.20*  CALCIUM 9.1   RADIOLOGY:  Dg Chest 1 View  Result Date: 07/21/2016 CLINICAL DATA:  Respiratory failure EXAM: CHEST 1 VIEW COMPARISON:  07/19/2016 FINDINGS: Cardiomediastinal silhouette is stable. Atherosclerotic calcifications of thoracic aorta. Stable left basilar scarring. No infiltrate or pulmonary edema. IMPRESSION: No active disease. Electronically Signed   By: Stephanie [name] M.D.   On: 07/21/2016 09:18   ASSESSMENT AND PLAN:  Stephanie [name]  is a 80 y.o. female with a known history of COPD, chronic coronary artery disease and multiple other medical problems is presenting to the emergency department with shortness of breath. She was seen by her primary care provider yesterday and she was discharged with steroids and antibiotics for COPD exacerbation with no significant improvement. Patient called  EMS and she was 86% on room air. Feeling tight in her chest  #Acute respiratory failure secondary to COPD exacerbation - Requiring BiPAP most times,  further mgmt per Intensivist - easily drops her sats off BiPap per nursing Continue prednisone, DuoNeb's and doxycycline Albuterol neb  treatments as needed Sputum culture and sensitivity ordered - Intensivist following  #Elevated troponin could be from demand ischemia with history of coronary artery disease - Serial troponins negative  . #Hyponatremia - possibly from dehydration from poor by mouth intake - continue hydration with IV fluids -Monitor   # CAD No chest pain at this time  #Peptic ulcer disease Protonix   Monitor as ICU-Stepdown  All the records are reviewed and case discussed with Care Management/Social Worker. Management plans discussed with the patient, family and they are in agreement.  CODE STATUS: Full code  TOTAL TIME (critical care) TAKING CARE OF THIS PATIENT: 35 minutes.   More than 50% of the time was spent in counseling/coordination of care: YES  POSSIBLE D/C IN 1-2 DAYS, DEPENDING ON CLINICAL CONDITION. And pulmo status   Stephanie [name] M.D on 07/21/2016 at 4:22 PM  Between 7am to 6pm - Pager - [phone]  After 6pm go to [email or web address] - Proofreader  [facility]  Office  [phone]  CC: Primary care physician; Stephanie Murders, PA  Note: This dictation was prepared with Dragon dictation along with smaller phrase technology. Any transcriptional errors that result from this process are unintentional.

## 2016-07-22 DIAGNOSIS — R748 Abnormal levels of other serum enzymes: Secondary | ICD-10-CM | POA: Diagnosis not present

## 2016-07-22 DIAGNOSIS — J441 Chronic obstructive pulmonary disease with (acute) exacerbation: Secondary | ICD-10-CM | POA: Diagnosis not present

## 2016-07-22 DIAGNOSIS — J9601 Acute respiratory failure with hypoxia: Secondary | ICD-10-CM | POA: Diagnosis not present

## 2016-07-22 DIAGNOSIS — E871 Hypo-osmolality and hyponatremia: Secondary | ICD-10-CM | POA: Diagnosis not present

## 2016-07-22 LAB — BASIC METABOLIC PANEL
ANION GAP: 4 — AB (ref 5–15)
BUN: 33 mg/dL — ABNORMAL HIGH (ref 6–20)
CHLORIDE: 103 mmol/L (ref 101–111)
CO2: 27 mmol/L (ref 22–32)
CREATININE: 1.06 mg/dL — AB (ref 0.44–1.00)
Calcium: 9 mg/dL (ref 8.9–10.3)
GFR calc Af Amer: 57 mL/min — ABNORMAL LOW (ref >60)
GFR calc non Af Amer: 49 mL/min — ABNORMAL LOW (ref >60)
Glucose, Bld: 98 mg/dL (ref 65–99)
POTASSIUM: 4 mmol/L (ref 3.5–5.1)
SODIUM: 134 mmol/L — AB (ref 135–145)

## 2016-07-22 LAB — EXPECTORATED SPUTUM ASSESSMENT W GRAM STAIN, RFLX TO RESP C

## 2016-07-22 LAB — EXPECTORATED SPUTUM ASSESSMENT W REFEX TO RESP CULTURE

## 2016-07-22 MED ORDER — DOCUSATE SODIUM 100 MG PO CAPS
100.0000 mg | ORAL_CAPSULE | Freq: Two times a day (BID) | ORAL | Status: DC
  Administered 2016-07-22 – 2016-07-23 (×3): 100 mg via ORAL
  Filled 2016-07-22 (×3): qty 1

## 2016-07-22 MED ORDER — ASPIRIN EC 81 MG PO TBEC
81.0000 mg | DELAYED_RELEASE_TABLET | Freq: Every day | ORAL | Status: DC
  Administered 2016-07-22 – 2016-07-25 (×4): 81 mg via ORAL
  Filled 2016-07-22 (×4): qty 1

## 2016-07-22 NOTE — Progress Notes (Signed)
Pt has remained alert and oriented with no c/o pain. Pt remained on bipap overnight with no distress. Lung sounds bilat rhonchi with expiratory wheezes. Pt transition to 40% HFNC around 0800. RR even and unlabored. NSR with occasional PVCs on cardiac monitor. Pt afebrile. Pt with orders to transfer to floor.

## 2016-07-22 NOTE — Progress Notes (Signed)
[facility] at [place] NAME: Stephanie [name]    MR#:  [id number]  DATE OF BIRTH:  1936-03-07  SUBJECTIVE:  CHIEF COMPLAINT:   Chief Complaint  Patient presents with  . Respiratory Distress  somewhat better, HFNC 40%  REVIEW OF SYSTEMS:  Review of Systems  Constitutional: Positive for malaise/fatigue. Negative for chills, fever and weight loss.  HENT: Negative for nosebleeds and sore throat.   Eyes: Negative for blurred vision.  Respiratory: Positive for shortness of breath. Negative for cough.   Cardiovascular: Negative for chest pain, orthopnea, leg swelling and PND.  Gastrointestinal: Negative for abdominal pain, constipation, diarrhea, heartburn, nausea and vomiting.  Genitourinary: Negative for dysuria and urgency.  Musculoskeletal: Negative for back pain.  Skin: Negative for rash.  Neurological: Positive for weakness. Negative for dizziness, speech change, focal weakness and headaches.  Endo/Heme/Allergies: Does not bruise/bleed easily.  Psychiatric/Behavioral: Negative for depression.    DRUG ALLERGIES:   Allergies  Allergen Reactions  . Duloxetine Hcl   . Influenza Vaccines   . Other     STEROIDS  DON'T WORK FOR HER  . Venlafaxine     GI distress   VITALS:  Blood pressure (!) 151/80, pulse 93, temperature 97 F (36.1 C), temperature source Axillary, resp. rate 13, height '5\' 4"'$  (1.626 m), weight 57.9 kg (127 lb 10.3 oz), SpO2 93 %. PHYSICAL EXAMINATION:  Physical Exam  Constitutional: She is oriented to person, place, and time and well-developed, well-nourished, and in no distress.  HENT:  Head: Normocephalic and atraumatic.  Eyes: Conjunctivae and EOM are normal. Pupils are equal, round, and reactive to light.  Neck: Normal range of motion. Neck supple. No tracheal deviation present. No thyromegaly present.  Cardiovascular: Normal rate, regular rhythm and normal heart sounds.   Pulmonary/Chest: Accessory muscle  usage present. No respiratory distress. She has decreased breath sounds in the right lower field and the left lower field. She has no wheezes. She exhibits no tenderness.  Abdominal: Soft. Bowel sounds are normal. She exhibits no distension. There is no tenderness.  Musculoskeletal: Normal range of motion.  Neurological: She is alert and oriented to person, place, and time. No cranial nerve deficit.  Skin: Skin is warm and dry. No rash noted.  Psychiatric: Mood and affect normal.   LABORATORY PANEL:   CBC  Recent Labs Lab 07/21/16 0443  WBC 8.1  HGB 11.2*  HCT 32.9*  PLT 197   ------------------------------------------------------------------------------------------------------------------ Chemistries   Recent Labs Lab 07/22/16 0559  NA 134*  K 4.0  CL 103  CO2 27  GLUCOSE 98  BUN 33*  CREATININE 1.06*  CALCIUM 9.0   ASSESSMENT AND PLAN:  Stephanie [name]  is a 80 y.o. female with a known history of COPD, chronic coronary artery disease and multiple other medical problems is presenting to the emergency department with shortness of breath. She was seen by her primary care provider yesterday and she was discharged with steroids and antibiotics for COPD exacerbation with no significant improvement. [name]. Feeling tight in her chest  # Acute respiratory failure secondary to COPD exacerbation - now weaned to 40% HFNC - BiPAP at night - Continue prednisone, DuoNeb's and doxycycline - Albuterol neb treatments as needed - Sputum culture sent and sensitivity ordered  # Elevated troponin could be from demand ischemia with history of coronary artery disease - Serial troponins negative  # Hyponatremia - possibly from dehydration from poor  by mouth intake - continue hydration with IV fluids - Monitor   # CAD No chest pain at this time  # Peptic ulcer disease Protonix  Transfer to any med-surg with off unit tele   All the  records are reviewed and case discussed with Care Management/Social Worker. Management plans discussed with the patient, family and they are in agreement.  CODE STATUS: Full code  TOTAL TIME TAKING CARE OF THIS PATIENT: 35 minutes.   More than 50% of the time was spent in counseling/coordination of care: YES  POSSIBLE D/C IN 1-2 DAYS, DEPENDING ON CLINICAL CONDITION. And pulmo status   Max Sane M.D on 07/22/2016 at 9:15 AM  Between 7am to 6pm - Pager - [phone]  After 6pm go to [email or web address] - Proofreader  [facility]  Office  [phone]  CC: Primary care physician; Vernie Murders, PA  Note: This dictation was prepared with Dragon dictation along with smaller phrase technology. Any transcriptional errors that result from this process are unintentional.

## 2016-07-22 NOTE — Evaluation (Signed)
Physical Therapy Evaluation Patient Details Name: Mariaisabel Bodiford MRN: [id number] DOB: 10-Dec-1935 Today's Date: 07/22/2016   History of Present Illness  80 y.o. female with a known history of COPD, chronic coronary artery disease and multiple other medical problems.  She has tapered from BIPAP to 40% HFNC at time of PT exam.    Clinical Impression  Pt on 40% HFNC t/o session, she was very eager to work with PT and do exercises but with ~10 minutes of exercises apart from PT exam she consistently had drop in O2 from mid/low 90s to mid 80s with just 5-10 reps of lightly resisted exercises.  She was able to recover with purposeful breathing, but clearly fatigued with the whole effort of PT and after minimal ambulation she needed to stop and rest.  Pt wanting to go home (and per pulmonary progress likely could) but given today's limitations she is unsafe at home and would struggle.     Follow Up Recommendations SNF (per pulmonary status pt very keen on going home)    Equipment Recommendations  Rolling walker with 5" wheels (per progress)    Recommendations for Other Services       Precautions / Restrictions Precautions Precautions: Fall Restrictions Weight Bearing Restrictions: No      Mobility  Bed Mobility Overal bed mobility: Modified Independent             General bed mobility comments: Pt slower than her baseline getting to sitting EOB, but overall was able to get to EOB w/o assist.  Transfers Overall transfer level: Independent Equipment used: None             General transfer comment: Pt was able to rise w/o direct assist, she did not have any lightheadedness, but did show some minimal initial unsteadiness  Ambulation/Gait Ambulation/Gait assistance: Min guard;Min assist Ambulation Distance (Feet): 15 Feet Assistive device: None       General Gait Details: Pt was able to ambulate around foot of bed but was weak and did have some unsteadiness and had 2  small LOBs needing light assist to maintain balance.  She was on 40% HFNC, and her sats slowly dropped from mid 90s to high 80s with the minimal effort, she was unable to get sats back to 90s while standing after the effort - deferred further ambulation.    Stairs            Wheelchair Mobility    Modified Rankin (Stroke Patients Only)       Balance Overall balance assessment: Needs assistance   Sitting balance-Leahy Scale: Good       Standing balance-Leahy Scale: Fair                               Pertinent Vitals/Pain Pain Assessment: No/denies pain    Home Living Family/patient expects to be discharged to:: Private residence Living Arrangements: Alone Available Help at Discharge: Family   Home Access: Stairs to enter   Technical brewer of Steps: 2-3 small steps Home Layout: Two level (bedroom is upstairs) Home Equipment: None      Prior Function Level of Independence: Independent         Comments: Pt is very active still driving and running all her errands independently     Hand Dominance        Extremity/Trunk Assessment   Upper Extremity Assessment Upper Extremity Assessment: Overall WFL for tasks assessed    Lower  Extremity Assessment Lower Extremity Assessment: Overall WFL for tasks assessed       Communication   Communication: No difficulties  Cognition Arousal/Alertness: Awake/alert Behavior During Therapy: WFL for tasks assessed/performed Overall Cognitive Status: Within Functional Limits for tasks assessed                      General Comments      Exercises General Exercises - Lower Extremity Quad Sets: Strengthening;10 reps;Both Gluteal Sets: Strengthening;10 reps;Both Heel Slides: Strengthening;5 reps;Both Hip ABduction/ADduction: Strengthening;5 reps;Both   Assessment/Plan    PT Assessment Patient needs continued PT services  PT Problem List Decreased strength;Decreased range of  motion;Decreased activity tolerance;Decreased balance;Decreased mobility;Decreased coordination;Decreased safety awareness;Cardiopulmonary status limiting activity;Decreased knowledge of use of DME          PT Treatment Interventions DME instruction;Gait training;Stair training;Therapeutic activities;Functional mobility training;Therapeutic exercise;Balance training;Neuromuscular re-education;Patient/family education    PT Goals (Current goals can be found in the Care Plan section)  Acute Rehab PT Goals Patient Stated Goal: go home PT Goal Formulation: With patient Time For Goal Achievement: 08/05/16 Potential to Achieve Goals: Fair    Frequency Min 2X/week   Barriers to discharge        Co-evaluation               End of Session Equipment Utilized During Treatment: Gait belt;Oxygen Activity Tolerance: Patient limited by fatigue Patient left: with chair alarm set;with call bell/phone within reach;with family/visitor present           Time: 1350-1410 PT Time Calculation (min) (ACUTE ONLY): 20 min   Charges:   PT Evaluation $PT Eval Low Complexity: 1 Procedure PT Treatments $Therapeutic Exercise: 8-22 mins   PT G Codes:        Kreg Shropshire, DPT 07/22/2016, 3:26 PM

## 2016-07-22 NOTE — Progress Notes (Signed)
D/w with Hospitalist service, Dr. Manuella Ghazi, pt being transferred to medical floor today. ICU service will sign off for now. Please consult Dr. Raul Del for respiratory issues, who is his usual pulmonary physician.  Marda Stalker, M.D.

## 2016-07-23 DIAGNOSIS — J9601 Acute respiratory failure with hypoxia: Secondary | ICD-10-CM | POA: Diagnosis not present

## 2016-07-23 DIAGNOSIS — J441 Chronic obstructive pulmonary disease with (acute) exacerbation: Secondary | ICD-10-CM | POA: Diagnosis not present

## 2016-07-23 DIAGNOSIS — R748 Abnormal levels of other serum enzymes: Secondary | ICD-10-CM | POA: Diagnosis not present

## 2016-07-23 DIAGNOSIS — E871 Hypo-osmolality and hyponatremia: Secondary | ICD-10-CM | POA: Diagnosis not present

## 2016-07-23 MED ORDER — GUAIFENESIN ER 600 MG PO TB12
600.0000 mg | ORAL_TABLET | Freq: Two times a day (BID) | ORAL | Status: DC
  Administered 2016-07-23 – 2016-07-25 (×5): 600 mg via ORAL
  Filled 2016-07-23 (×5): qty 1

## 2016-07-23 MED ORDER — IPRATROPIUM-ALBUTEROL 0.5-2.5 (3) MG/3ML IN SOLN
3.0000 mL | Freq: Four times a day (QID) | RESPIRATORY_TRACT | Status: DC
  Administered 2016-07-23 – 2016-07-25 (×8): 3 mL via RESPIRATORY_TRACT
  Filled 2016-07-23 (×8): qty 3

## 2016-07-23 NOTE — Progress Notes (Signed)
Placed patient on HFNC 40 liters and 40%. tol well at this time.

## 2016-07-23 NOTE — Plan of Care (Signed)
Problem: Activity: Goal: Risk for activity intolerance will decrease Outcome: Progressing Pt up to chair with PT, pt using BSC with no distress  Problem: Bowel/Gastric: Goal: Will not experience complications related to bowel motility Outcome: Not Progressing Pt has not had a BM since 12/11, however pt states this is normal for her.

## 2016-07-23 NOTE — Progress Notes (Signed)
[facility] at [place] NAME: Stephanie [name]    MRN#:  [id number]  DATE OF BIRTH:  March 15, 1936  SUBJECTIVE:  Hospital Day: 4 days Stephanie [name] is a 80 y.o. female presenting with Respiratory Distress .   Overnight events: No acute overnight events remains on high flow nasal cannula Interval Events: States breathing feels better still with some cough  REVIEW OF SYSTEMS:  CONSTITUTIONAL: No fever, fatigue or weakness.  EYES: No blurred or double vision.  EARS, NOSE, AND THROAT: No tinnitus or ear pain.  RESPIRATORY: Positive cough, shortness of breath, denies wheezing or hemoptysis.  CARDIOVASCULAR: No chest pain, orthopnea, edema.  GASTROINTESTINAL: No nausea, vomiting, diarrhea or abdominal pain.  GENITOURINARY: No dysuria, hematuria.  ENDOCRINE: No polyuria, nocturia,  HEMATOLOGY: No anemia, easy bruising or bleeding SKIN: No rash or lesion. MUSCULOSKELETAL: No joint pain or arthritis.   NEUROLOGIC: No tingling, numbness, weakness.  PSYCHIATRY: No anxiety or depression.   DRUG ALLERGIES:   Allergies  Allergen Reactions  . Duloxetine Hcl   . Influenza Vaccines   . Other     STEROIDS  DON'T WORK FOR HER  . Venlafaxine     GI distress    VITALS:  Blood pressure 123/70, pulse 100, temperature 97.8 F (36.6 C), temperature source Oral, resp. rate (!) 22, height '5\' 4"'$  (1.626 m), weight 57.9 kg (127 lb 10.3 oz), SpO2 95 %.  PHYSICAL EXAMINATION:  VITAL SIGNS: Vitals:   07/23/16 0410 07/23/16 0941  BP: 130/70 123/70  Pulse: 83 100  Resp: 18 (!) 22  Temp: 98 F (36.7 C) 97.8 F (36.6 C)   GENERAL:80 y.o.female currently in no acute distress.  HEAD: Normocephalic, atraumatic.  EYES: Pupils equal, round, reactive to light. Extraocular muscles intact. No scleral icterus.  MOUTH: Moist mucosal membrane. Dentition intact. No abscess noted.  EAR, NOSE, THROAT: Clear without exudates. No external lesions.  NECK:  Supple. No thyromegaly. No nodules. No JVD.  PULMONARY: Bilateral coarse rhonchi scant wheeze No use of accessory muscles, Good respiratory effort. good air entry bilaterally CHEST: Nontender to palpation.  CARDIOVASCULAR: S1 and S2. Regular rate and rhythm. No murmurs, rubs, or gallops. No edema. Pedal pulses 2+ bilaterally.  GASTROINTESTINAL: Soft, nontender, nondistended. No masses. Positive bowel sounds. No hepatosplenomegaly.  MUSCULOSKELETAL: No swelling, clubbing, or edema. Range of motion full in all extremities.  NEUROLOGIC: Cranial nerves II through XII are intact. No gross focal neurological deficits. Sensation intact. Reflexes intact.  SKIN: No ulceration, lesions, rashes, or cyanosis. Skin warm and dry. Turgor intact.  PSYCHIATRIC: Mood, affect within normal limits. The patient is awake, alert and oriented x 3. Insight, judgment intact.      LABORATORY PANEL:   CBC  Recent Labs Lab 07/21/16 0443  WBC 8.1  HGB 11.2*  HCT 32.9*  PLT 197   ------------------------------------------------------------------------------------------------------------------  Chemistries   Recent Labs Lab 07/22/16 0559  NA 134*  K 4.0  CL 103  CO2 27  GLUCOSE 98  BUN 33*  CREATININE 1.06*  CALCIUM 9.0   ------------------------------------------------------------------------------------------------------------------  Cardiac Enzymes  Recent Labs Lab 07/20/16 1250  TROPONINI 0.09*   ------------------------------------------------------------------------------------------------------------------  RADIOLOGY:  No results found.  EKG:   Orders placed or performed during the hospital encounter of 07/19/16  . EKG 12-Lead  . EKG 12-Lead  . ED EKG  . ED EKG  . EKG 12-Lead  . EKG 12-Lead    ASSESSMENT AND PLAN:   Stephanie [name] is a 80 y.o. female  presenting with Respiratory Distress . Admitted 07/19/2016 : Day #: 4 days 1. Acute respiratory failure with hypoxia,  COPD exacerbation: Currently on high flow nasal cannula transition to nasal cannula, wean as tolerated continue steroids breathing treatments, antibiotics 5 day duration 2. Hyponatremia: Resolved 3. Gastroesophageal reflux sees without esophagitis: PPI therapy   All the records are reviewed and case discussed with Care Management/Social Workerr. Management plans discussed with the patient, family and they are in agreement.  CODE STATUS: full TOTAL TIME TAKING CARE OF THIS PATIENT: 28 minutes.   POSSIBLE D/C IN 2-3DAYS, DEPENDING ON CLINICAL CONDITION.   Stephanie [name],  Karenann Cai.D on 07/23/2016 at 11:49 AM  Between 7am to 6pm - Pager - [phone]  After 6pm: House Pager: - [phone]  [facility]  Office  [phone]  CC: Primary care physician; Vernie Murders, [place]

## 2016-07-23 NOTE — Progress Notes (Signed)
Physical Therapy Treatment Patient Details Name: Stephanie [name] MRN: [id number] DOB: March 21, 1936 Today's Date: 07/23/2016    History of Present Illness 80 y.o. female with a known history of COPD, chronic coronary artery disease and multiple other medical problems.  She has tapered from BIPAP to 40% HFNC at time of PT exam.      PT Comments    Pt ready for session.  Was able to transfer out of bed to commode with min a x 1.  Sats 91% on HFNC.  Dipped to 87 upon transfer to recliner but improved shortly to mid 90's.  Was able to stand for exercises as described below with 1UE assist.  Respiratory Therapist in for trial on 4lpm nasal canula.  She was able to ambulate in room 15' x 2 with hand held assist x 1.  Sats remained in mid 90's during gait.  She was limited by general fatigue.    Discussed home environment and she lives in second story apartment with no elevator.  15 stairs.  Discussed walker for energy conservation.  She stated she does not like rollator style walker and would prefer a regular rolling walker if needed.  Pt remains significantly limited at this time due to respiratory status and general weakness.  SNF remains and appropriate discharge plan.   Follow Up Recommendations  SNF     Equipment Recommendations  Rolling walker with 5" wheels    Recommendations for Other Services       Precautions / Restrictions Precautions Precautions: Fall Restrictions Weight Bearing Restrictions: No    Mobility  Bed Mobility Overal bed mobility: Modified Independent             General bed mobility comments: Pt slower than her baseline getting to sitting EOB, but overall was able to get to EOB w/o assist.  Transfers Overall transfer level: Needs assistance Equipment used: 1 person hand held assist             General transfer comment: Required some assist for steadiness this session.    Ambulation/Gait Ambulation/Gait assistance: Min guard Ambulation  Distance (Feet): 15 Feet Assistive device: 1 person hand held assist Gait Pattern/deviations: Step-to pattern   Gait velocity interpretation: Below normal speed for age/gender General Gait Details: initially unsteady but improved with time.  discussed assistive device options for energy conservation and occasional balance.   Stairs            Wheelchair Mobility    Modified Rankin (Stroke Patients Only)       Balance                                    Cognition Arousal/Alertness: Awake/alert Behavior During Therapy: WFL for tasks assessed/performed Overall Cognitive Status: Within Functional Limits for tasks assessed                      Exercises Other Exercises Other Exercises: standing exercises with 1 UE support for marching, slr and toe raises x 10    General Comments        Pertinent Vitals/Pain Pain Assessment: 0-10 Pain Score: 4  Pain Location: ribs Pain Descriptors / Indicators: Sore Pain Intervention(s): Limited activity within patient's tolerance    Home Living                      Prior Function  PT Goals (current goals can now be found in the care plan section) Progress towards PT goals: Progressing toward goals    Frequency    Min 2X/week      PT Plan      Co-evaluation             End of Session Equipment Utilized During Treatment: Gait belt;Oxygen Activity Tolerance: Patient limited by fatigue Patient left: with chair alarm set;with call bell/phone within reach;with family/visitor present     Time: 5974-1638 PT Time Calculation (min) (ACUTE ONLY): 28 min  Charges:  $Gait Training: 8-22 mins $Therapeutic Exercise: 8-22 mins                    G Codes:      Chesley Noon 27-Jul-2016, 12:57 PM

## 2016-07-23 NOTE — Progress Notes (Signed)
Changed patient from HFNC to 4 liter regular nasal cannula, sats are 95% and patient tol well at this time, will continue to monitor.

## 2016-07-24 DIAGNOSIS — J441 Chronic obstructive pulmonary disease with (acute) exacerbation: Secondary | ICD-10-CM | POA: Diagnosis not present

## 2016-07-24 DIAGNOSIS — E871 Hypo-osmolality and hyponatremia: Secondary | ICD-10-CM | POA: Diagnosis not present

## 2016-07-24 DIAGNOSIS — R748 Abnormal levels of other serum enzymes: Secondary | ICD-10-CM | POA: Diagnosis not present

## 2016-07-24 DIAGNOSIS — J9601 Acute respiratory failure with hypoxia: Secondary | ICD-10-CM | POA: Diagnosis not present

## 2016-07-24 MED ORDER — SENNOSIDES-DOCUSATE SODIUM 8.6-50 MG PO TABS
1.0000 | ORAL_TABLET | Freq: Two times a day (BID) | ORAL | Status: DC
  Administered 2016-07-24 – 2016-07-25 (×3): 1 via ORAL
  Filled 2016-07-24 (×3): qty 1

## 2016-07-24 MED ORDER — POLYETHYLENE GLYCOL 3350 17 G PO PACK
17.0000 g | PACK | Freq: Every day | ORAL | Status: DC | PRN
  Administered 2016-07-24: 09:00:00 17 g via ORAL
  Filled 2016-07-24: qty 1

## 2016-07-24 NOTE — Progress Notes (Signed)
Took patient off of bipap and placed on 2lnc, pt tol well.

## 2016-07-24 NOTE — Progress Notes (Signed)
Pt is in no distress. Pt does not wish to wear Bipap for sleep tonight. Pt says she does not wear a Bipap/Cpap at home and does not need to wear it at this time. Bipap is in the room on standby

## 2016-07-24 NOTE — Progress Notes (Signed)
[facility] at [place] NAME: Stephanie [name]    MRN#:  [id number]  DATE OF BIRTH:  November 20, 1935  SUBJECTIVE:  Hospital Day: 5 days Stephanie [name] is a 80 y.o. female presenting with Respiratory Distress .   Overnight events: No acute overnight events Interval Events: States breathing feels better still with some cough  REVIEW OF SYSTEMS:  CONSTITUTIONAL: No fever, fatigue or weakness.  EYES: No blurred or double vision.  EARS, NOSE, AND THROAT: No tinnitus or ear pain.  RESPIRATORY: Positive cough, shortness of breath, denies wheezing or hemoptysis.  CARDIOVASCULAR: No chest pain, orthopnea, edema.  GASTROINTESTINAL: No nausea, vomiting, diarrhea or abdominal pain.  GENITOURINARY: No dysuria, hematuria.  ENDOCRINE: No polyuria, nocturia,  HEMATOLOGY: No anemia, easy bruising or bleeding SKIN: No rash or lesion. MUSCULOSKELETAL: No joint pain or arthritis.   NEUROLOGIC: No tingling, numbness, weakness.  PSYCHIATRY: No anxiety or depression.   DRUG ALLERGIES:   Allergies  Allergen Reactions  . Duloxetine Hcl   . Influenza Vaccines   . Other     STEROIDS  DON'T WORK FOR HER  . Venlafaxine     GI distress    VITALS:  Blood pressure (!) 149/79, pulse 85, temperature 97.6 F (36.4 C), temperature source Oral, resp. rate 20, height '5\' 4"'$  (1.626 m), weight 57.9 kg (127 lb 10.3 oz), SpO2 96 %.  PHYSICAL EXAMINATION:  VITAL SIGNS: Vitals:   07/23/16 2033 07/24/16 0500  BP: (!) 159/73 (!) 149/79  Pulse: 89 85  Resp: 20 20  Temp: 97.4 F (36.3 C) 97.6 F (36.4 C)   GENERAL:80 y.o.female currently in no acute distress.  HEAD: Normocephalic, atraumatic.  EYES: Pupils equal, round, reactive to light. Extraocular muscles intact. No scleral icterus.  MOUTH: Moist mucosal membrane. Dentition intact. No abscess noted.  EAR, NOSE, THROAT: Clear without exudates. No external lesions.  NECK: Supple. No thyromegaly. No nodules.  No JVD.  PULMONARY: Improved Bilateral coarse rhonchi scant wheeze No use of accessory muscles, Good respiratory effort. good air entry bilaterally CHEST: Nontender to palpation.  CARDIOVASCULAR: S1 and S2. Regular rate and rhythm. No murmurs, rubs, or gallops. No edema. Pedal pulses 2+ bilaterally.  GASTROINTESTINAL: Soft, nontender, nondistended. No masses. Positive bowel sounds. No hepatosplenomegaly.  MUSCULOSKELETAL: No swelling, clubbing, or edema. Range of motion full in all extremities.  NEUROLOGIC: Cranial nerves II through XII are intact. No gross focal neurological deficits. Sensation intact. Reflexes intact.  SKIN: No ulceration, lesions, rashes, or cyanosis. Skin warm and dry. Turgor intact.  PSYCHIATRIC: Mood, affect within normal limits. The patient is awake, alert and oriented x 3. Insight, judgment intact.      LABORATORY PANEL:   CBC  Recent Labs Lab 07/21/16 0443  WBC 8.1  HGB 11.2*  HCT 32.9*  PLT 197   ------------------------------------------------------------------------------------------------------------------  Chemistries   Recent Labs Lab 07/22/16 0559  NA 134*  K 4.0  CL 103  CO2 27  GLUCOSE 98  BUN 33*  CREATININE 1.06*  CALCIUM 9.0   ------------------------------------------------------------------------------------------------------------------  Cardiac Enzymes  Recent Labs Lab 07/20/16 1250  TROPONINI 0.09*   ------------------------------------------------------------------------------------------------------------------  RADIOLOGY:  No results found.  EKG:   Orders placed or performed during the hospital encounter of 07/19/16  . EKG 12-Lead  . EKG 12-Lead  . ED EKG  . ED EKG  . EKG 12-Lead  . EKG 12-Lead    ASSESSMENT AND PLAN:   Stephanie [name] is a 80 y.o. female presenting with Respiratory Distress .  Admitted 07/19/2016 : Day #: 5 days 1. Acute respiratory failure with hypoxia, COPD exacerbation:Down to 2 L  nasal cannula wean as tolerated continue steroids breathing treatments, antibiotics 5 day duration 2. Hyponatremia: Resolved 3. Gastroesophageal reflux sees without esophagitis: PPI therapy  Physical therapy recommended skilled placement  All the records are reviewed and case discussed with Care Management/Social Workerr. Management plans discussed with the patient, family and they are in agreement.  CODE STATUS: full TOTAL TIME TAKING CARE OF THIS PATIENT: 28 minutes.   POSSIBLE D/C IN 2-3DAYS, DEPENDING ON CLINICAL CONDITION.   Hower,  Karenann [name] on 07/24/2016 at 12:02 PM  Between 7am to 6pm - Pager - [phone]  After 6pm: House Pager: - [phone]  [name]  Office  [phone]  CC: Primary care physician; Vernie Murders, PA

## 2016-07-24 NOTE — NC FL2 (Signed)
[place] LEVEL OF CARE SCREENING TOOL     IDENTIFICATION  Patient Name: Stephanie [name] Birthdate: September 27, 1935 Sex: female Admission Date (Current Location): 07/19/2016  [place] and [place] Number:  Engineering geologist and Address:  [facility], [address] [place], [place] [address]      Provider Number: [id number]  Attending Physician Name and Address:  Lytle Butte, MD  Relative Name and Phone Number:       Current Level of Care: Hospital Recommended Level of Care: [place] Prior Approval Number:    Date Approved/Denied: 07/24/16 PASRR Number: [id number]  Discharge Plan: SNF    Current Diagnoses: Patient Active Problem List   Diagnosis Date Noted  . Acute exacerbation of chronic obstructive pulmonary disease (COPD) ([place]) 07/19/2016  . Depression 05/03/2016  . Anxiety 05/03/2016  . Mild dementia 04/19/2016  . COPD, moderate ([place]) 03/05/2015  . Anxiety and depression 03/04/2015  . Arteriosclerosis of coronary artery 03/04/2015  . CAFL (chronic airflow limitation) ([place]) 03/04/2015  . Breathlessness on exertion 03/04/2015  . HLD (hyperlipidemia) 03/04/2015  . Osteopenia 03/04/2015  . Peptic ulcer 03/04/2015  . Amnesia 03/20/2014  . Disordered sleep 03/20/2014  . Allergy to environmental factors 11/14/2013  . Gastroduodenal ulcer 11/14/2013  . GI bleed 11/14/2013    Orientation RESPIRATION BLADDER Height & Weight     Self, Time, Situation, Place  O2 (Acute o2, 2L plan to discontinue by dc.) Continent Weight: 127 lb 10.3 oz (57.9 kg) Height:  '5\' 4"'$  (162.6 cm)  BEHAVIORAL SYMPTOMS/MOOD NEUROLOGICAL BOWEL NUTRITION STATUS      Continent    AMBULATORY STATUS COMMUNICATION OF NEEDS Skin   Extensive Assist Verbally Normal                       Personal Care Assistance Level of Assistance  Bathing, Dressing Bathing Assistance: Limited assistance   Dressing Assistance: Limited  assistance     Functional Limitations Info             SPECIAL CARE FACTORS FREQUENCY  PT (By licensed PT), OT (By licensed OT)     PT Frequency: Up to 5X per week, 5X per day OT Frequency: Up to 5X per week, 5X per day            Contractures Contractures Info: Present    Additional Factors Info  Allergies   Allergies Info: Duloxetine Hcl, Influenza Vaccines, Other, Venlafaxine           Current Medications (07/24/2016):  This is the current hospital active medication list Current Facility-Administered Medications  Medication Dose Route Frequency Provider Last Rate Last Dose  . aspirin EC tablet 81 mg  81 mg Oral Daily Max Sane, MD   81 mg at 07/24/16 0859  . budesonide (PULMICORT) nebulizer solution 0.5 mg  0.5 mg Nebulization BID Vishal Mungal, MD   0.5 mg at 07/24/16 0736  . chlorhexidine (PERIDEX) 0.12 % solution 15 mL  15 mL Mouth Rinse BID Laverle Hobby, MD   15 mL at 07/24/16 0901  . clonazePAM (KLONOPIN) tablet 0.5 mg  0.5 mg Oral BID Vishal Mungal, MD   0.5 mg at 07/24/16 0859  . doxycycline (VIBRA-TABS) tablet 100 mg  100 mg Oral Q12H Max Sane, MD   100 mg at 07/24/16 0859  . enoxaparin (LOVENOX) injection 40 mg  40 mg Subcutaneous Q24H Nicholes Mango, MD   40 mg at 07/23/16 2127  . guaiFENesin (MUCINEX) 12  hr tablet 600 mg  600 mg Oral BID Lytle Butte, MD   600 mg at 07/24/16 0859  . hydrALAZINE (APRESOLINE) injection 10-40 mg  10-40 mg Intravenous Q4H PRN Holley Raring, NP   20 mg at 07/20/16 2059  . ipratropium-albuterol (DUONEB) 0.5-2.5 (3) MG/3ML nebulizer solution 3 mL  3 mL Nebulization Q4H PRN Bincy S Varughese, NP   3 mL at 07/23/16 1842  . ipratropium-albuterol (DUONEB) 0.5-2.5 (3) MG/3ML nebulizer solution 3 mL  3 mL Nebulization Q6H Lytle Butte, MD   3 mL at 07/24/16 1421  . MEDLINE mouth rinse  15 mL Mouth Rinse q12n4p Laverle Hobby, MD   15 mL at 07/24/16 1343  . ondansetron (ZOFRAN) tablet 4 mg  4 mg Oral Q6H PRN Nicholes Mango, MD       Or  . ondansetron (ZOFRAN) injection 4 mg  4 mg Intravenous Q6H PRN Nicholes Mango, MD   4 mg at 07/20/16 0709  . pantoprazole (PROTONIX) EC tablet 40 mg  40 mg Oral BID AC Vishal Mungal, MD   40 mg at 07/24/16 0900  . PARoxetine (PAXIL) tablet 10 mg  10 mg Oral QHS Bincy S Varughese, NP   10 mg at 07/23/16 2127  . polyethylene glycol (MIRALAX / GLYCOLAX) packet 17 g  17 g Oral Daily PRN Lytle Butte, MD   17 g at 07/24/16 0859  . [START ON 07/25/2016] predniSONE (DELTASONE) tablet 20 mg  20 mg Oral Q breakfast Vishal Mungal, MD       Followed by  . [START ON 07/27/2016] predniSONE (DELTASONE) tablet 10 mg  10 mg Oral Q breakfast Vishal Mungal, MD      . senna-docusate (Senokot-S) tablet 1 tablet  1 tablet Oral BID Lytle Butte, MD   1 tablet at 07/24/16 [phone]  . sodium chloride flush (NS) 0.9 % injection 3 mL  3 mL Intravenous Q12H Nicholes Mango, MD   3 mL at 07/24/16 0902     Discharge Medications: Please see discharge summary for a list of discharge medications.  Relevant Imaging Results:  Relevant Lab Results:   Additional Information SS# [id number]  Zettie Pho, LCSW

## 2016-07-24 NOTE — Care Management Important Message (Signed)
Important Message  Patient Details  Name: Stephanie [name] MRN: [id number] Date of Birth: 03/22/1936   Medicare Important Message Given:  Yes    Branna Cortina A, RN 07/24/2016, 4:44 PM

## 2016-07-24 NOTE — Clinical Social Work Note (Signed)
Clinical Social Work Assessment  Patient Details  Name: Stephanie [name] MRN: [id number] Date of Birth: 03-28-1936  Date of referral:  07/24/16               Reason for consult:  Facility Placement                Permission sought to share information with:  Chartered certified accountant granted to share information::  Yes, Verbal Permission Granted  Name::        Agency::     Relationship::     Contact Information:     Housing/Transportation Living arrangements for the past 2 months:  [place] of Information:  Patient, Adult Children Patient Interpreter Needed:  None Criminal Activity/Legal Involvement Pertinent to Current Situation/Hospitalization:  No - Comment as needed Significant Relationships:  Adult Children, [place], [employer] Lives with:  Self Do you feel safe going back to the place where you live?  Yes Need for family participation in patient care:  No (Coment)  Care giving concerns:  STR   Social Worker assessment / plan:  The CSW visited the patient and her daughter at bedside to discuss discharge planning. The patient gave verbal permission to conduct a bed search for SNF and indicated that her preference is for [facility]. The patient's daughter reported that she would transport to the facility. The patient lives alone and is independent with all ADLs and IADLs at baseline. Currently she is on 2L o2 but will wean prior to dc. She does not use chronic o2.  Employment status:  Retired Nurse, adult PT Recommendations:  [place] / Referral to community resources:  [place]  Patient/Family's Response to care:  Patient and family thanked CSW for assistance.  Patient/Family's Understanding of and Emotional Response to Diagnosis, Current Treatment, and Prognosis:  The patient and family are in agreement with dc plan.  Emotional Assessment Appearance:   Appears stated age Attitude/Demeanor/Rapport:   (Very pleasant) Affect (typically observed):  Accepting, Appropriate, Pleasant Orientation:  Oriented to Self, Oriented to Place, Oriented to  Time, Oriented to Situation Alcohol / Substance use:  Never Used Psych involvement (Current and /or in the community):  No (Comment)  Discharge Needs  Concerns to be addressed:  Discharge Planning Concerns Readmission within the last 30 days:  No Current discharge risk:  Chronically ill Barriers to Discharge:  Continued Medical Work up   [employer], LCSW 07/24/2016, 3:27 PM

## 2016-07-25 DIAGNOSIS — M1990 Unspecified osteoarthritis, unspecified site: Secondary | ICD-10-CM | POA: Diagnosis not present

## 2016-07-25 DIAGNOSIS — J441 Chronic obstructive pulmonary disease with (acute) exacerbation: Secondary | ICD-10-CM | POA: Diagnosis not present

## 2016-07-25 DIAGNOSIS — Z7982 Long term (current) use of aspirin: Secondary | ICD-10-CM | POA: Diagnosis not present

## 2016-07-25 DIAGNOSIS — I2510 Atherosclerotic heart disease of native coronary artery without angina pectoris: Secondary | ICD-10-CM | POA: Diagnosis not present

## 2016-07-25 DIAGNOSIS — I82409 Acute embolism and thrombosis of unspecified deep veins of unspecified lower extremity: Secondary | ICD-10-CM | POA: Diagnosis not present

## 2016-07-25 DIAGNOSIS — J449 Chronic obstructive pulmonary disease, unspecified: Secondary | ICD-10-CM | POA: Diagnosis not present

## 2016-07-25 DIAGNOSIS — J9601 Acute respiratory failure with hypoxia: Secondary | ICD-10-CM | POA: Diagnosis not present

## 2016-07-25 DIAGNOSIS — Z955 Presence of coronary angioplasty implant and graft: Secondary | ICD-10-CM | POA: Diagnosis not present

## 2016-07-25 DIAGNOSIS — E871 Hypo-osmolality and hyponatremia: Secondary | ICD-10-CM | POA: Diagnosis not present

## 2016-07-25 DIAGNOSIS — F329 Major depressive disorder, single episode, unspecified: Secondary | ICD-10-CM | POA: Diagnosis not present

## 2016-07-25 DIAGNOSIS — E785 Hyperlipidemia, unspecified: Secondary | ICD-10-CM | POA: Diagnosis not present

## 2016-07-25 DIAGNOSIS — Z9981 Dependence on supplemental oxygen: Secondary | ICD-10-CM | POA: Diagnosis not present

## 2016-07-25 DIAGNOSIS — F419 Anxiety disorder, unspecified: Secondary | ICD-10-CM | POA: Diagnosis not present

## 2016-07-25 DIAGNOSIS — R748 Abnormal levels of other serum enzymes: Secondary | ICD-10-CM | POA: Diagnosis not present

## 2016-07-25 LAB — CREATININE, SERUM
CREATININE: 1.04 mg/dL — AB (ref 0.44–1.00)
GFR, EST AFRICAN AMERICAN: 58 mL/min — AB (ref >60)
GFR, EST NON AFRICAN AMERICAN: 50 mL/min — AB (ref >60)

## 2016-07-25 LAB — CBC
HCT: 34 % — ABNORMAL LOW (ref 35.0–47.0)
Hemoglobin: 11.4 g/dL — ABNORMAL LOW (ref 12.0–16.0)
MCH: 30.5 pg (ref 26.0–34.0)
MCHC: 33.6 g/dL (ref 32.0–36.0)
MCV: 90.9 fL (ref 80.0–100.0)
PLATELETS: 234 10*3/uL (ref 150–440)
RBC: 3.74 MIL/uL — ABNORMAL LOW (ref 3.80–5.20)
RDW: 14.1 % (ref 11.5–14.5)
WBC: 10.6 10*3/uL (ref 3.6–11.0)

## 2016-07-25 LAB — CULTURE, RESPIRATORY: CULTURE: NORMAL

## 2016-07-25 LAB — CULTURE, RESPIRATORY W GRAM STAIN

## 2016-07-25 MED ORDER — BISACODYL 5 MG PO TBEC
5.0000 mg | DELAYED_RELEASE_TABLET | Freq: Every day | ORAL | Status: DC | PRN
  Administered 2016-07-25: 10:00:00 5 mg via ORAL
  Filled 2016-07-25: qty 1

## 2016-07-25 MED ORDER — SENNOSIDES-DOCUSATE SODIUM 8.6-50 MG PO TABS: 1.0000 | ORAL_TABLET | Freq: Every evening | ORAL | 0 refills | Status: DC | PRN

## 2016-07-25 MED ORDER — IPRATROPIUM-ALBUTEROL 0.5-2.5 (3) MG/3ML IN SOLN: 3.0000 mL | RESPIRATORY_TRACT | Status: DC | PRN

## 2016-07-25 MED ORDER — GUAIFENESIN ER 600 MG PO TB12: 600.0000 mg | ORAL_TABLET | Freq: Two times a day (BID) | ORAL | 0 refills | Status: AC | PRN

## 2016-07-25 MED ORDER — CLONAZEPAM 0.5 MG PO TABS: 0.5000 mg | ORAL_TABLET | Freq: Two times a day (BID) | ORAL | 0 refills | Status: DC

## 2016-07-25 MED ORDER — PREDNISONE 10 MG PO TABS: ORAL_TABLET | ORAL | 0 refills | Status: DC

## 2016-07-25 MED ORDER — BISACODYL 5 MG PO TBEC: 5.0000 mg | DELAYED_RELEASE_TABLET | Freq: Every day | ORAL | 0 refills | Status: DC | PRN

## 2016-07-25 MED ORDER — SENNOSIDES-DOCUSATE SODIUM 8.6-50 MG PO TABS: 1.0000 | ORAL_TABLET | Freq: Two times a day (BID) | ORAL | 0 refills | Status: DC

## 2016-07-25 NOTE — Progress Notes (Signed)
[facility] on standby. Pt on 2L [place]. Pt is in no distress

## 2016-07-25 NOTE — Progress Notes (Signed)
Chaplain was making his rounds and visited with pt in room 103. Provided the ministry of prayer and emotional support.    07/25/16 1405  Clinical Encounter Type  Visited With Patient  Visit Type Initial;Spiritual support  Referral From Nurse  Spiritual Encounters  Spiritual Needs Prayer

## 2016-07-25 NOTE — Progress Notes (Signed)
Patient discharged to [facility] via wheelchair by nursing staff. Madlyn Frankel, RN

## 2016-07-25 NOTE — Progress Notes (Signed)
[place] to bring oxygen to bedside for transportation. Madlyn Frankel, RN

## 2016-07-25 NOTE — Progress Notes (Signed)
[place] notified of patient having bowel movement. Awaiting daughter to provide transportation. Madlyn Frankel, RN

## 2016-07-25 NOTE — Clinical Social Work Note (Addendum)
Patient to be d/c'ed today to [facility], SNF.  Patient and family agreeable to plans will transport via daughter's car RN to call report [phone] room [phone].  Evette Cristal, MSW, LCSWA Mon-Fri 8a-4:30p [phone]

## 2016-07-25 NOTE — Clinical Social Work Placement (Signed)
   CLINICAL SOCIAL WORK PLACEMENT  NOTE  Date:  07/25/2016  Patient Details  Name: Stephanie [name] MRN: [id number] Date of Birth: 05/18/1936  Clinical Social Work is seeking post-discharge placement for this patient at the [place] level of care (*CSW will initial, date and re-position this form in  chart as items are completed):  Yes   Patient/family provided with [place] Work Department's list of facilities offering this level of care within the geographic area requested by the patient (or if unable, by the patient's family).  Yes   Patient/family informed of their freedom to choose among providers that offer the needed level of care, that participate in Medicare, Medicaid or managed care program needed by the patient, have an available bed and are willing to accept the patient.  Yes   Patient/family informed of [facility]'s ownership interest in [facility] and [facility], as well as of the fact that they are under no obligation to receive care at these facilities.  PASRR submitted to EDS on 07/24/16     PASRR number received on 07/24/16     Existing PASRR number confirmed on       FL2 transmitted to all facilities in geographic area requested by pt/family on 07/24/16     FL2 transmitted to all facilities within larger geographic area on       Patient informed that his/her managed care company has contracts with or will negotiate with certain facilities, including the following:        Yes   Patient/family informed of bed offers received.  Patient chooses bed at [facility]     Physician recommends and patient chooses bed at      Patient to be transferred to [facility] on 07/25/16.  Patient to be transferred to facility by Patient's daughter     Patient family notified on 07/25/16 of transfer.  Name of family member notified:  Lovette Cliche [phone]     PHYSICIAN Please sign FL2      Additional Comment:    _______________________________________________ Ross Ludwig, LCSWA 07/25/2016, 1:49 PM

## 2016-07-25 NOTE — Clinical Social Work Note (Signed)
CSW spoke to patient's daughter Beverlee Nims, who would like patient to go to [facility] for short term rehab.  CSW contacted [facility] who can accept patient today once insurance has been approved.  Patient's daughter would like to transport patient to SNF, CSW to facilitate discharge planning once insurance authorization has been given.  Jones Broom. Learned, MSW, [place]  Mon-Fri 8a-4:30p 07/25/2016 10:17 AM

## 2016-07-25 NOTE — Progress Notes (Addendum)
[facility] is not willing to accept patient due to LBM being 12/11 per the nursing supervisor at [facility]. Patient and daughter made aware. MD made aware. Madlyn Frankel, RN  Oxygen delivered to bedside. 2:51 PM

## 2016-07-25 NOTE — Care Management Note (Signed)
Case Management Note  Patient Details  Name: Stephanie [name] MRN: [id number] Date of Birth: 01/29/1936  Subjective/Objective:       Text to SWer Randall Hiss that Stephanie [name] is being d/ced today ro SNF.              Action/Plan:   Expected Discharge Date:                  Expected Discharge Plan:     In-House Referral:     Discharge planning Services     Post Acute Care Choice:    Choice offered to:     DME Arranged:    DME Agency:     HH Arranged:    HH Agency:     Status of Service:     If discussed at [employer] of Stay Meetings, dates discussed:    Additional Comments:  Stephanie [name] A, RN 07/25/2016, 10:48 AM

## 2016-07-25 NOTE — Discharge Summary (Signed)
[place] at [place] NAME: Stephanie [name]    MR#:  [id number]  DATE OF BIRTH:  May 17, 1936  DATE OF ADMISSION:  07/19/2016 ADMITTING PHYSICIAN: Nicholes Mango, MD  DATE OF DISCHARGE: 07/25/16  PRIMARY CARE PHYSICIAN: Vernie Murders, PA    ADMISSION DIAGNOSIS:  COPD exacerbation ([place]) [J44.1]  DISCHARGE DIAGNOSIS:  Active Problems:   Acute exacerbation of chronic obstructive pulmonary disease (COPD) ([place]) acute hypoxic respiratory failure  SECONDARY DIAGNOSIS:   Past Medical History:  Diagnosis Date  . Arthritis   . Complication of anesthesia   . COPD (chronic obstructive pulmonary disease) ([place])   . Coronary artery disease   . Depression   . Dyspnea   . Edema   . Pneumonia    IN PAST  . PONV (postoperative nausea and vomiting)   . Wheezing     HOSPITAL COURSE:  Stephanie [name]  is a 80 y.o. female admitted 07/19/2016 with chief complaint Respiratory Distress . Please see H&P performed by Nicholes Mango, MD for further information. Patient presented with the above symptoms, requiring BiPAP and ICU care on admission. After treatment with Levaquin, steroids, breathing treatments she was able to be weaned down on oxygen but still required oxygen to maintain saturations. She was evaluated by PT who recommended SNF  DISCHARGE CONDITIONS:   stable  CONSULTS OBTAINED:    DRUG ALLERGIES:   Allergies  Allergen Reactions  . Duloxetine Hcl   . Influenza Vaccines   . Other     STEROIDS  DON'T WORK FOR HER  . Venlafaxine     GI distress    DISCHARGE MEDICATIONS:   Current Discharge Medication List    START taking these medications   Details  guaiFENesin (MUCINEX) 600 MG 12 hr tablet Take 1 tablet (600 mg total) by mouth 2 (two) times daily as needed for cough or to loosen phlegm. Qty: 30 tablet, Refills: 0    senna-docusate (SENOKOT-S) 8.6-50 MG tablet Take 1 tablet by mouth at bedtime as needed for mild  constipation. Qty: 60 tablet, Refills: 0      CONTINUE these medications which have CHANGED   Details  !! clonazePAM (KLONOPIN) 0.5 MG tablet Take 1 tablet (0.5 mg total) by mouth 2 (two) times daily. Qty: 30 tablet, Refills: 0    predniSONE (DELTASONE) 10 MG tablet '40mg'$  x1 day, '20mg'$  x2 day, '10mg'$  x2 day then stop Qty: 10 tablet, Refills: 0     !! - Potential duplicate medications found. Please discuss with provider.    CONTINUE these medications which have NOT CHANGED   Details  acetaminophen (TYLENOL) 500 MG tablet Take 1 tablet by mouth at bedtime.     albuterol (VENTOLIN HFA) 108 (90 Base) MCG/ACT inhaler Inhale 2 puffs into the lungs every 6 (six) hours as needed. Qty: 3 Inhaler, Refills: 3    aspirin 325 MG EC tablet Take 1 tablet by mouth at bedtime.     budesonide-formoterol (SYMBICORT) 160-4.5 MCG/ACT inhaler Inhale 2 puffs into the lungs 2 (two) times daily. Qty: 3 Inhaler, Refills: 3    Calcium Carb-Cholecalciferol (CALCIUM 600+D) 600-800 MG-UNIT TABS Take 2 tablets by mouth daily.    !! clonazePAM (KLONOPIN) 0.5 MG tablet Take 1 tablet (0.5 mg total) by mouth 2 (two) times daily as needed for anxiety. Qty: 180 tablet, Refills: 5   Associated Diagnoses: Anxiety associated with depression    docusate sodium (COLACE) 100 MG capsule Take 200 mg by mouth at bedtime.  DUREZOL 0.05 % EMUL Place 1 drop into the right eye 2 (two) times daily.    ibuprofen (ADVIL,MOTRIN) 200 MG tablet Take 400 mg by mouth daily as needed for headache or moderate pain.     PARoxetine (PAXIL) 10 MG tablet TAKE TWO TABLETS BY MOUTH AT BEDTIME DAILY Qty: 60 tablet, Refills: 2    pravastatin (PRAVACHOL) 20 MG tablet Take 10 mg by mouth daily.     tiotropium (SPIRIVA HANDIHALER) 18 MCG inhalation capsule Place 1 capsule (18 mcg total) into inhaler and inhale daily. Qty: 90 capsule, Refills: 3     !! - Potential duplicate medications found. Please discuss with provider.    STOP taking  these medications     levofloxacin (LEVAQUIN) 500 MG tablet          DISCHARGE INSTRUCTIONS:    DIET:  Regular diet  DISCHARGE CONDITION:  Stable  ACTIVITY:  Activity as tolerated  OXYGEN:  Home Oxygen: Yes.     Oxygen Delivery: 2 liters/min via Patient connected to nasal cannula oxygen  DISCHARGE LOCATION:  nursing home   If you experience worsening of your admission symptoms, develop shortness of breath, life threatening emergency, suicidal or homicidal thoughts you must seek medical attention immediately by calling 911 or calling your MD immediately  if symptoms less severe.  You Must read complete instructions/literature along with all the possible adverse reactions/side effects for all the Medicines you take and that have been prescribed to you. Take any new Medicines after you have completely understood and accpet all the possible adverse reactions/side effects.   Please note  You were cared for by a hospitalist during your hospital stay. If you have any questions about your discharge medications or the care you received while you were in the hospital after you are discharged, you can call the unit and asked to speak with the hospitalist on call if the hospitalist that took care of you is not available. Once you are discharged, your primary care physician will handle any further medical issues. Please note that NO REFILLS for any discharge medications will be authorized once you are discharged, as it is imperative that you return to your primary care physician (or establish a relationship with a primary care physician if you do not have one) for your aftercare needs so that they can reassess your need for medications and monitor your lab values.    On the day of Discharge:   VITAL SIGNS:  Blood pressure (!) 147/74, pulse 96, temperature 98.2 F (36.8 C), temperature source Oral, resp. rate 20, height '5\' 4"'$  (1.626 m), weight 57.9 kg (127 lb 10.3 oz), SpO2 94 %.  I/O:    Intake/Output Summary (Last 24 hours) at 07/25/16 0953 Last data filed at 07/25/16 0951  Gross per 24 hour  Intake              360 ml  Output              450 ml  Net              -90 ml    PHYSICAL EXAMINATION:  GENERAL:  80 y.o.-year-old patient lying in the bed with no acute distress.  EYES: Pupils equal, round, reactive to light and accommodation. No scleral icterus. Extraocular muscles intact.  HEENT: Head atraumatic, normocephalic. Oropharynx and nasopharynx clear.  NECK:  Supple, no jugular venous distention. No thyroid enlargement, no tenderness.  LUNGS:scant rhonchi but improved no wheezing, rales,rhonchi or crepitation. No use of  accessory muscles of respiration.  CARDIOVASCULAR: S1, S2 normal. No murmurs, rubs, or gallops.  ABDOMEN: Soft, non-tender, non-distended. Bowel sounds present. No organomegaly or mass.  EXTREMITIES: No pedal edema, cyanosis, or clubbing.  NEUROLOGIC: Cranial nerves II through XII are intact. Muscle strength 5/5 in all extremities. Sensation intact. Gait not checked.  PSYCHIATRIC: The patient is alert and oriented x 3.  SKIN: No obvious rash, lesion, or ulcer.   DATA REVIEW:   CBC  Recent Labs Lab 07/25/16 0543  WBC 10.6  HGB 11.4*  HCT 34.0*  PLT 234    Chemistries   Recent Labs Lab 07/22/16 0559 07/25/16 0543  NA 134*  --   K 4.0  --   CL 103  --   CO2 27  --   GLUCOSE 98  --   BUN 33*  --   CREATININE 1.06* 1.04*  CALCIUM 9.0  --     Cardiac Enzymes  Recent Labs Lab 07/20/16 1250  TROPONINI 0.09*    Microbiology Results  Results for orders placed or performed during the hospital encounter of 07/19/16  MRSA PCR Screening     Status: None   Collection Time: 07/19/16  9:31 PM  Result Value Ref Range Status   MRSA by PCR NEGATIVE NEGATIVE Final    Comment:        The GeneXpert MRSA Assay (FDA approved for NASAL specimens only), is one component of a comprehensive MRSA colonization surveillance program. It is  not intended to diagnose MRSA infection nor to guide or monitor treatment for MRSA infections.   Culture, sputum-assessment     Status: None   Collection Time: 07/22/16  8:10 AM  Result Value Ref Range Status   Specimen Description EXPECTORATED SPUTUM  Final   Special Requests NONE  Final   Sputum evaluation THIS SPECIMEN IS ACCEPTABLE FOR SPUTUM CULTURE  Final   Report Status 07/22/2016 FINAL  Final  Culture, respiratory (NON-Expectorated)     Status: None (Preliminary result)   Collection Time: 07/22/16  8:10 AM  Result Value Ref Range Status   Specimen Description EXPECTORATED SPUTUM  Final   Special Requests NONE Reflexed from [id number]  Final   Gram Stain   Final    RARE WBC PRESENT, PREDOMINANTLY PMN FEW SQUAMOUS EPITHELIAL CELLS PRESENT FEW GRAM POSITIVE COCCI IN PAIRS RARE GRAM NEGATIVE COCCOBACILLI RARE GRAM POSITIVE RODS RARE YEAST    Culture   Final    CULTURE REINCUBATED FOR BETTER GROWTH Performed at [facility]    Report Status PENDING  Incomplete    RADIOLOGY:  No results found.   Management plans discussed with the patient, family and they are in agreement.  CODE STATUS:     Code Status Orders        Start     Ordered   07/19/16 2115  Full code  Continuous     07/19/16 2114    Code Status History    Date Active Date Inactive Code Status Order ID Comments User Context   07/19/2016  5:02 PM 07/19/2016  9:14 PM Full Code [id number]  Nicholes Mango, MD ED    Advance Directive Documentation   Flowsheet Row Most Recent Value  Type of Advance Directive  Healthcare Power of [place], Living will  Pre-existing out of facility DNR order (yellow form or pink MOST form)  No data  "MOST" Form in Place?  No data      TOTAL TIME TAKING CARE OF THIS PATIENT: 33 minutes.  Hower,  Karenann Cai.D on 07/25/2016 at 9:53 AM  Between 7am to 6pm - Pager - [phone]  After 6pm go to [email or web address] - Patent attorney  Hospitalists  Office  [phone]  CC: Primary care physician; Vernie Murders, PA

## 2016-07-25 NOTE — Progress Notes (Signed)
Reviewed discharge plans and new prescriptions with patient and daughter at bedside. Report called to [facility]. Awaiting confirmation from SW about patient needing oxygen for transportation which was planned to be provided by the patients daughter. Madlyn Frankel, RN  Discharge on hold at this time until arrangements are made regarding oxygen. 2:27 PM

## 2016-07-26 DIAGNOSIS — J449 Chronic obstructive pulmonary disease, unspecified: Secondary | ICD-10-CM | POA: Diagnosis not present

## 2016-07-26 DIAGNOSIS — I2510 Atherosclerotic heart disease of native coronary artery without angina pectoris: Secondary | ICD-10-CM | POA: Diagnosis not present

## 2016-07-26 DIAGNOSIS — F329 Major depressive disorder, single episode, unspecified: Secondary | ICD-10-CM | POA: Diagnosis not present

## 2016-07-27 DIAGNOSIS — I82409 Acute embolism and thrombosis of unspecified deep veins of unspecified lower extremity: Secondary | ICD-10-CM | POA: Diagnosis not present

## 2016-07-27 DIAGNOSIS — F419 Anxiety disorder, unspecified: Secondary | ICD-10-CM | POA: Diagnosis not present

## 2016-07-27 DIAGNOSIS — J449 Chronic obstructive pulmonary disease, unspecified: Secondary | ICD-10-CM | POA: Diagnosis not present

## 2016-07-29 ENCOUNTER — Other Ambulatory Visit: Payer: Self-pay

## 2016-07-29 NOTE — Patient Outreach (Signed)
[facility]) Care Management  07/29/2016  Stephanie [name] [birth year] [id number]     EMMI-GENERAL DISCHARGE FOR FOLLOW-UP RED ON EMMI ALERT Day # 1 Date: 07/28/16 Red Alert Reason: "Scheduled follow up appt? No    New prescriptions? I don't know"   Outreach attempt #1 to patient. Spoke with patient. Confirmed with patient that she was discharged from [facility] to [facility] for rehab. She states she is doing better and making progress. She is unsure of how long she will remain at facility. Staff managing meds and patient being seen by facility MD. Advised patient that RN CM would have automated EMMI calls discontinued due to her being in a facility. She verbalized understanding and was appreciative of follow up call.   Plan: RN CM will notify [facility] administrative assistant of case closure status and to deactivate EMMI calls.  Enzo Montgomery, RN,BSN,CCM [place] Management Telephonic Care Management Coordinator Direct Phone: [phone] Toll Free: [phone] Fax: [phone]

## 2016-08-04 ENCOUNTER — Other Ambulatory Visit: Payer: Self-pay | Admitting: *Deleted

## 2016-08-04 NOTE — Patient Outreach (Signed)
Stephanie [name] [facility]) Care Management  08/04/2016  Stephanie [name] [birth year] [id number]   Spoke with patient at length at facility. Patient denies any Care management needs. She plans to discharge home to her apartment 08/05/16. She states she has transportation to appointments, no issues getting  Medications and is independent. She was on oxygen at facility, but has been weaned off oxygen at this point.  Patient reports great family support.  No needs assessed.  Confirmed with SW that patient has not outstanding discharge planning needs.  Plan Will sign off case.   Royetta Crochet. Laymond Purser, RN, BSN, [facility] [phone]) Business Cell  [phone]) Toll Free Office

## 2016-08-05 DIAGNOSIS — J449 Chronic obstructive pulmonary disease, unspecified: Secondary | ICD-10-CM | POA: Diagnosis not present

## 2016-08-05 DIAGNOSIS — F419 Anxiety disorder, unspecified: Secondary | ICD-10-CM | POA: Diagnosis not present

## 2016-08-05 DIAGNOSIS — I82A29 Chronic embolism and thrombosis of unspecified axillary vein: Secondary | ICD-10-CM | POA: Diagnosis not present

## 2016-08-05 DIAGNOSIS — I6350 Cerebral infarction due to unspecified occlusion or stenosis of unspecified cerebral artery: Secondary | ICD-10-CM | POA: Diagnosis not present

## 2016-08-05 DIAGNOSIS — I10 Essential (primary) hypertension: Secondary | ICD-10-CM | POA: Diagnosis not present

## 2016-08-05 DIAGNOSIS — I82409 Acute embolism and thrombosis of unspecified deep veins of unspecified lower extremity: Secondary | ICD-10-CM | POA: Diagnosis not present

## 2016-08-17 ENCOUNTER — Emergency Department (HOSPITAL_COMMUNITY): Payer: PPO

## 2016-08-17 ENCOUNTER — Inpatient Hospital Stay (HOSPITAL_COMMUNITY)
Admission: EM | Admit: 2016-08-17 | Discharge: 2016-08-22 | DRG: 041 | Disposition: A | Discharge status: Skilled Nursing Facility | Payer: PPO | Attending: Family Medicine | Admitting: Family Medicine

## 2016-08-17 ENCOUNTER — Encounter (HOSPITAL_COMMUNITY): Payer: Self-pay | Admitting: Emergency Medicine

## 2016-08-17 DIAGNOSIS — Z833 Family history of diabetes mellitus: Secondary | ICD-10-CM

## 2016-08-17 DIAGNOSIS — I2510 Atherosclerotic heart disease of native coronary artery without angina pectoris: Secondary | ICD-10-CM | POA: Diagnosis not present

## 2016-08-17 DIAGNOSIS — I63412 Cerebral infarction due to embolism of left middle cerebral artery: Principal | ICD-10-CM

## 2016-08-17 DIAGNOSIS — Z8249 Family history of ischemic heart disease and other diseases of the circulatory system: Secondary | ICD-10-CM

## 2016-08-17 DIAGNOSIS — Z961 Presence of intraocular lens: Secondary | ICD-10-CM | POA: Diagnosis not present

## 2016-08-17 DIAGNOSIS — I248 Other forms of acute ischemic heart disease: Secondary | ICD-10-CM | POA: Diagnosis present

## 2016-08-17 DIAGNOSIS — Z888 Allergy status to other drugs, medicaments and biological substances status: Secondary | ICD-10-CM | POA: Diagnosis not present

## 2016-08-17 DIAGNOSIS — Z887 Allergy status to serum and vaccine status: Secondary | ICD-10-CM

## 2016-08-17 DIAGNOSIS — Z87891 Personal history of nicotine dependence: Secondary | ICD-10-CM

## 2016-08-17 DIAGNOSIS — E441 Mild protein-calorie malnutrition: Secondary | ICD-10-CM | POA: Diagnosis not present

## 2016-08-17 DIAGNOSIS — R778 Other specified abnormalities of plasma proteins: Secondary | ICD-10-CM | POA: Diagnosis present

## 2016-08-17 DIAGNOSIS — J45901 Unspecified asthma with (acute) exacerbation: Secondary | ICD-10-CM

## 2016-08-17 DIAGNOSIS — I82532 Chronic embolism and thrombosis of left popliteal vein: Secondary | ICD-10-CM | POA: Diagnosis not present

## 2016-08-17 DIAGNOSIS — H53461 Homonymous bilateral field defects, right side: Secondary | ICD-10-CM | POA: Diagnosis present

## 2016-08-17 DIAGNOSIS — M5412 Radiculopathy, cervical region: Secondary | ICD-10-CM | POA: Diagnosis present

## 2016-08-17 DIAGNOSIS — I679 Cerebrovascular disease, unspecified: Secondary | ICD-10-CM | POA: Diagnosis not present

## 2016-08-17 DIAGNOSIS — Z7982 Long term (current) use of aspirin: Secondary | ICD-10-CM

## 2016-08-17 DIAGNOSIS — M50221 Other cervical disc displacement at C4-C5 level: Secondary | ICD-10-CM | POA: Diagnosis not present

## 2016-08-17 DIAGNOSIS — Z66 Do not resuscitate: Secondary | ICD-10-CM | POA: Diagnosis present

## 2016-08-17 DIAGNOSIS — G459 Transient cerebral ischemic attack, unspecified: Secondary | ICD-10-CM

## 2016-08-17 DIAGNOSIS — R7989 Other specified abnormal findings of blood chemistry: Secondary | ICD-10-CM | POA: Diagnosis present

## 2016-08-17 DIAGNOSIS — R4701 Aphasia: Secondary | ICD-10-CM | POA: Diagnosis not present

## 2016-08-17 DIAGNOSIS — I82432 Acute embolism and thrombosis of left popliteal vein: Secondary | ICD-10-CM | POA: Diagnosis not present

## 2016-08-17 DIAGNOSIS — M1990 Unspecified osteoarthritis, unspecified site: Secondary | ICD-10-CM | POA: Diagnosis present

## 2016-08-17 DIAGNOSIS — M50222 Other cervical disc displacement at C5-C6 level: Secondary | ICD-10-CM | POA: Diagnosis not present

## 2016-08-17 DIAGNOSIS — Z9841 Cataract extraction status, right eye: Secondary | ICD-10-CM

## 2016-08-17 DIAGNOSIS — I69314 Frontal lobe and executive function deficit following cerebral infarction: Secondary | ICD-10-CM | POA: Diagnosis not present

## 2016-08-17 DIAGNOSIS — M5010 Cervical disc disorder with radiculopathy, unspecified cervical region: Secondary | ICD-10-CM | POA: Diagnosis not present

## 2016-08-17 DIAGNOSIS — J449 Chronic obstructive pulmonary disease, unspecified: Secondary | ICD-10-CM | POA: Diagnosis present

## 2016-08-17 DIAGNOSIS — R748 Abnormal levels of other serum enzymes: Secondary | ICD-10-CM | POA: Diagnosis not present

## 2016-08-17 DIAGNOSIS — Q211 Atrial septal defect: Secondary | ICD-10-CM

## 2016-08-17 DIAGNOSIS — I69320 Aphasia following cerebral infarction: Secondary | ICD-10-CM | POA: Diagnosis not present

## 2016-08-17 DIAGNOSIS — I639 Cerebral infarction, unspecified: Secondary | ICD-10-CM | POA: Diagnosis present

## 2016-08-17 DIAGNOSIS — G629 Polyneuropathy, unspecified: Secondary | ICD-10-CM

## 2016-08-17 DIAGNOSIS — F329 Major depressive disorder, single episode, unspecified: Secondary | ICD-10-CM | POA: Diagnosis present

## 2016-08-17 DIAGNOSIS — I82409 Acute embolism and thrombosis of unspecified deep veins of unspecified lower extremity: Secondary | ICD-10-CM

## 2016-08-17 DIAGNOSIS — Z9842 Cataract extraction status, left eye: Secondary | ICD-10-CM | POA: Diagnosis not present

## 2016-08-17 DIAGNOSIS — Z955 Presence of coronary angioplasty implant and graft: Secondary | ICD-10-CM | POA: Diagnosis not present

## 2016-08-17 DIAGNOSIS — H539 Unspecified visual disturbance: Secondary | ICD-10-CM | POA: Diagnosis not present

## 2016-08-17 DIAGNOSIS — Z800 Family history of malignant neoplasm of digestive organs: Secondary | ICD-10-CM

## 2016-08-17 DIAGNOSIS — Z9981 Dependence on supplemental oxygen: Secondary | ICD-10-CM | POA: Diagnosis not present

## 2016-08-17 DIAGNOSIS — Z7901 Long term (current) use of anticoagulants: Secondary | ICD-10-CM | POA: Diagnosis not present

## 2016-08-17 DIAGNOSIS — I69398 Other sequelae of cerebral infarction: Secondary | ICD-10-CM | POA: Diagnosis not present

## 2016-08-17 DIAGNOSIS — I10 Essential (primary) hypertension: Secondary | ICD-10-CM | POA: Diagnosis not present

## 2016-08-17 DIAGNOSIS — J441 Chronic obstructive pulmonary disease with (acute) exacerbation: Secondary | ICD-10-CM

## 2016-08-17 DIAGNOSIS — R29898 Other symptoms and signs involving the musculoskeletal system: Secondary | ICD-10-CM

## 2016-08-17 DIAGNOSIS — E785 Hyperlipidemia, unspecified: Secondary | ICD-10-CM | POA: Diagnosis not present

## 2016-08-17 DIAGNOSIS — R402142 Coma scale, eyes open, spontaneous, at arrival to emergency department: Secondary | ICD-10-CM | POA: Diagnosis present

## 2016-08-17 DIAGNOSIS — Q2112 Patent foramen ovale: Secondary | ICD-10-CM

## 2016-08-17 DIAGNOSIS — E782 Mixed hyperlipidemia: Secondary | ICD-10-CM | POA: Diagnosis not present

## 2016-08-17 DIAGNOSIS — R402362 Coma scale, best motor response, obeys commands, at arrival to emergency department: Secondary | ICD-10-CM | POA: Diagnosis present

## 2016-08-17 DIAGNOSIS — R402252 Coma scale, best verbal response, oriented, at arrival to emergency department: Secondary | ICD-10-CM | POA: Diagnosis present

## 2016-08-17 DIAGNOSIS — M6281 Muscle weakness (generalized): Secondary | ICD-10-CM | POA: Diagnosis not present

## 2016-08-17 DIAGNOSIS — R130 Aphagia: Secondary | ICD-10-CM | POA: Diagnosis not present

## 2016-08-17 DIAGNOSIS — F419 Anxiety disorder, unspecified: Secondary | ICD-10-CM | POA: Diagnosis not present

## 2016-08-17 DIAGNOSIS — Z6823 Body mass index (BMI) 23.0-23.9, adult: Secondary | ICD-10-CM

## 2016-08-17 LAB — COMPREHENSIVE METABOLIC PANEL
ALBUMIN: 3.4 g/dL — AB (ref 3.5–5.0)
ALT: 19 U/L (ref 14–54)
ANION GAP: 11 (ref 5–15)
AST: 20 U/L (ref 15–41)
Alkaline Phosphatase: 59 U/L (ref 38–126)
BILIRUBIN TOTAL: 0.3 mg/dL (ref 0.3–1.2)
BUN: 13 mg/dL (ref 6–20)
CHLORIDE: 104 mmol/L (ref 101–111)
CO2: 23 mmol/L (ref 22–32)
Calcium: 10 mg/dL (ref 8.9–10.3)
Creatinine, Ser: 1.04 mg/dL — ABNORMAL HIGH (ref 0.44–1.00)
GFR calc Af Amer: 58 mL/min — ABNORMAL LOW (ref >60)
GFR calc non Af Amer: 50 mL/min — ABNORMAL LOW (ref >60)
Glucose, Bld: 93 mg/dL (ref 65–99)
POTASSIUM: 4 mmol/L (ref 3.5–5.1)
Sodium: 138 mmol/L (ref 135–145)
TOTAL PROTEIN: 6 g/dL — AB (ref 6.5–8.1)

## 2016-08-17 LAB — DIFFERENTIAL
BASOS PCT: 0 %
Basophils Absolute: 0 10*3/uL (ref 0.0–0.1)
EOS ABS: 0 10*3/uL (ref 0.0–0.7)
EOS PCT: 1 %
LYMPHS ABS: 1.1 10*3/uL (ref 0.7–4.0)
Lymphocytes Relative: 16 %
MONOS PCT: 5 %
Monocytes Absolute: 0.4 10*3/uL (ref 0.1–1.0)
NEUTROS PCT: 78 %
Neutro Abs: 5.8 10*3/uL (ref 1.7–7.7)

## 2016-08-17 LAB — URINALYSIS, ROUTINE W REFLEX MICROSCOPIC
BILIRUBIN URINE: NEGATIVE
Glucose, UA: NEGATIVE mg/dL
Ketones, ur: NEGATIVE mg/dL
LEUKOCYTES UA: NEGATIVE
Nitrite: NEGATIVE
PROTEIN: NEGATIVE mg/dL
Specific Gravity, Urine: 1.009 (ref 1.005–1.030)
pH: 7 (ref 5.0–8.0)

## 2016-08-17 LAB — CBC
HCT: 34.9 % — ABNORMAL LOW (ref 36.0–46.0)
HEMOGLOBIN: 11.6 g/dL — AB (ref 12.0–15.0)
MCH: 30.4 pg (ref 26.0–34.0)
MCHC: 33.2 g/dL (ref 30.0–36.0)
MCV: 91.4 fL (ref 78.0–100.0)
Platelets: 190 10*3/uL (ref 150–400)
RBC: 3.82 MIL/uL — ABNORMAL LOW (ref 3.87–5.11)
RDW: 14.8 % (ref 11.5–15.5)
WBC: 7.3 10*3/uL (ref 4.0–10.5)

## 2016-08-17 LAB — I-STAT TROPONIN, ED: TROPONIN I, POC: 0.05 ng/mL (ref 0.00–0.08)

## 2016-08-17 LAB — RAPID URINE DRUG SCREEN, HOSP PERFORMED
Amphetamines: NOT DETECTED
Barbiturates: NOT DETECTED
Benzodiazepines: NOT DETECTED
COCAINE: NOT DETECTED
OPIATES: NOT DETECTED
Tetrahydrocannabinol: NOT DETECTED

## 2016-08-17 LAB — APTT: APTT: 29 s (ref 24–36)

## 2016-08-17 LAB — TROPONIN I: TROPONIN I: 0.04 ng/mL — AB (ref <0.03)

## 2016-08-17 LAB — PROTIME-INR
INR: 0.92
PROTHROMBIN TIME: 12.3 s (ref 11.4–15.2)

## 2016-08-17 MED ORDER — PRAVASTATIN SODIUM 20 MG PO TABS
10.0000 mg | ORAL_TABLET | Freq: Every day | ORAL | Status: DC
  Administered 2016-08-18 (×2): 10 mg via ORAL
  Filled 2016-08-17 (×2): qty 1

## 2016-08-17 MED ORDER — ASPIRIN EC 325 MG PO TBEC
325.0000 mg | DELAYED_RELEASE_TABLET | Freq: Every day | ORAL | Status: DC
  Administered 2016-08-17 – 2016-08-18 (×2): 325 mg via ORAL
  Filled 2016-08-17 (×2): qty 1

## 2016-08-17 MED ORDER — PROCHLORPERAZINE EDISYLATE 5 MG/ML IJ SOLN
5.0000 mg | Freq: Once | INTRAMUSCULAR | Status: AC
  Administered 2016-08-17: 5 mg via INTRAVENOUS
  Filled 2016-08-17: qty 2

## 2016-08-17 MED ORDER — BISACODYL 5 MG PO TBEC: 5.0000 mg | DELAYED_RELEASE_TABLET | Freq: Every day | ORAL | Status: DC | PRN

## 2016-08-17 MED ORDER — MOMETASONE FURO-FORMOTEROL FUM 200-5 MCG/ACT IN AERO
2.0000 | INHALATION_SPRAY | Freq: Two times a day (BID) | RESPIRATORY_TRACT | Status: DC
  Administered 2016-08-17 – 2016-08-22 (×9): 2 via RESPIRATORY_TRACT
  Filled 2016-08-17: qty 8.8

## 2016-08-17 MED ORDER — IPRATROPIUM-ALBUTEROL 0.5-2.5 (3) MG/3ML IN SOLN
3.0000 mL | Freq: Four times a day (QID) | RESPIRATORY_TRACT | Status: DC
  Administered 2016-08-17: 3 mL via RESPIRATORY_TRACT
  Filled 2016-08-17: qty 3

## 2016-08-17 MED ORDER — LORAZEPAM 2 MG/ML IJ SOLN
1.0000 mg | Freq: Once | INTRAMUSCULAR | Status: AC
  Administered 2016-08-18: 1 mg via INTRAVENOUS
  Filled 2016-08-17: qty 1

## 2016-08-17 MED ORDER — ALBUTEROL SULFATE (2.5 MG/3ML) 0.083% IN NEBU
2.5000 mg | INHALATION_SOLUTION | RESPIRATORY_TRACT | Status: DC | PRN
  Administered 2016-08-19: 2.5 mg via RESPIRATORY_TRACT
  Filled 2016-08-17: qty 3

## 2016-08-17 MED ORDER — ACETAMINOPHEN 160 MG/5ML PO SOLN: 650.0000 mg | ORAL | Status: DC | PRN

## 2016-08-17 MED ORDER — SODIUM CHLORIDE 0.9 % IV SOLN
INTRAVENOUS | Status: DC
  Administered 2016-08-18: via INTRAVENOUS

## 2016-08-17 MED ORDER — PAROXETINE HCL 20 MG PO TABS
10.0000 mg | ORAL_TABLET | Freq: Every day | ORAL | Status: DC
  Administered 2016-08-17 – 2016-08-21 (×5): 10 mg via ORAL
  Filled 2016-08-17 (×5): qty 1

## 2016-08-17 MED ORDER — STROKE: EARLY STAGES OF RECOVERY BOOK
Freq: Once | Status: AC
Start: 2016-08-17 — End: 2016-08-18
  Administered 2016-08-18

## 2016-08-17 MED ORDER — ENOXAPARIN SODIUM 40 MG/0.4ML ~~LOC~~ SOLN
40.0000 mg | SUBCUTANEOUS | Status: DC
  Administered 2016-08-18 – 2016-08-21 (×5): 40 mg via SUBCUTANEOUS
  Filled 2016-08-17 (×5): qty 0.4

## 2016-08-17 MED ORDER — DIFLUPREDNATE 0.05 % OP EMUL: 1.0000 [drp] | Freq: Two times a day (BID) | OPHTHALMIC | Status: DC

## 2016-08-17 MED ORDER — IPRATROPIUM-ALBUTEROL 0.5-2.5 (3) MG/3ML IN SOLN
3.0000 mL | Freq: Three times a day (TID) | RESPIRATORY_TRACT | Status: DC
  Administered 2016-08-18 (×3): 3 mL via RESPIRATORY_TRACT
  Filled 2016-08-17 (×3): qty 3

## 2016-08-17 MED ORDER — ACETAMINOPHEN 325 MG PO TABS
650.0000 mg | ORAL_TABLET | ORAL | Status: DC | PRN
  Administered 2016-08-18 – 2016-08-22 (×3): 650 mg via ORAL
  Filled 2016-08-17 (×3): qty 2

## 2016-08-17 MED ORDER — ACETAMINOPHEN 650 MG RE SUPP: 650.0000 mg | RECTAL | Status: DC | PRN

## 2016-08-17 MED ORDER — SENNOSIDES-DOCUSATE SODIUM 8.6-50 MG PO TABS: 1.0000 | ORAL_TABLET | Freq: Every evening | ORAL | Status: DC | PRN

## 2016-08-17 MED ORDER — CLONAZEPAM 0.5 MG PO TABS
0.5000 mg | ORAL_TABLET | Freq: Two times a day (BID) | ORAL | Status: DC
  Administered 2016-08-17 – 2016-08-22 (×9): 0.5 mg via ORAL
  Filled 2016-08-17 (×9): qty 1

## 2016-08-17 NOTE — ED Provider Notes (Signed)
I have personally seen and examined the patient. I have reviewed the documentation on PMH/FH/Soc Hx. I have discussed the plan of care with the resident and patient.  I have reviewed and agree with the resident's documentation. Please see associated encounter note.   EKG Interpretation  Date/Time:  Wednesday August 17 2016 15:44:51 EST Ventricular Rate:  101 PR Interval:    QRS Duration: 80 QT Interval:  337 QTC Calculation: 437 R Axis:   35 Text Interpretation:  Sinus tachycardia No significant change since last tracing Confirmed by [facility] MD, [place] ([id number]) on 08/17/2016 4:30:37 PM         Fatima Blank, MD 08/17/16 1630

## 2016-08-17 NOTE — ED Provider Notes (Signed)
[facility] DEPT Provider Note   CSN: [id number] Arrival date & time: 08/17/16  1530     History   Chief Complaint Chief Complaint  Patient presents with  . Aphasia    HPI Nicol Kenly Henckel is a 81 y.o. female.  The history is provided by the EMS personnel and a relative.  Cerebrovascular Accident  This is a new problem. Episode onset: unknown. Last known normal 730pm yesterday last seen by daughter. The problem has not changed since onset.Pertinent negatives include no chest pain, no abdominal pain, no headaches and no shortness of breath.  -Patient is a 81 year old female with history of CAD, COPD, depression who presents with strokelike symptoms. Patient missed a hair appointment early this afternoon and the patient's sister called her around 2 PM and noticed that her speech was garbled. Patient last seen by the daughter last night around 7:30 PM when she was still completely normal with normal speech. Per EMS patient has troubled speech and has difficulty following commands but no obvious sensory or motor deficit.  Past Medical History:  Diagnosis Date  . Arthritis   . Complication of anesthesia   . COPD (chronic obstructive pulmonary disease) ([place])   . Coronary artery disease   . Depression   . Dyspnea   . Edema   . Pneumonia    IN PAST  . PONV (postoperative nausea and vomiting)   . Wheezing     Patient Active Problem List   Diagnosis Date Noted  . Acute CVA (cerebrovascular accident) ([place]) 08/17/2016  . Acute exacerbation of chronic obstructive pulmonary disease (COPD) ([place]) 07/19/2016  . Depression 05/03/2016  . Anxiety 05/03/2016  . Mild dementia 04/19/2016  . COPD, moderate ([place]) 03/05/2015  . Anxiety and depression 03/04/2015  . Arteriosclerosis of coronary artery 03/04/2015  . CAFL (chronic airflow limitation) ([place]) 03/04/2015  . Breathlessness on exertion 03/04/2015  . HLD (hyperlipidemia) 03/04/2015  . Osteopenia 03/04/2015  . Peptic ulcer  03/04/2015  . Amnesia 03/20/2014  . Disordered sleep 03/20/2014  . Allergy to environmental factors 11/14/2013  . Gastroduodenal ulcer 11/14/2013  . GI bleed 11/14/2013    Past Surgical History:  Procedure Laterality Date  . BALLOON ANGIOPLASTY, ARTERY  05/17/2009  . CATARACT EXTRACTION W/PHACO Left 05/24/2016   Procedure: CATARACT EXTRACTION PHACO AND INTRAOCULAR LENS PLACEMENT (IOC);  Surgeon: Birder Robson, MD;  Location: [facility] ORS;  Service: Ophthalmology;  Laterality: Left;  Lot# [id number] H [place]:   00:52.3 AP%:   27.3 CDE:  14.26   . CATARACT EXTRACTION W/PHACO Right 06/14/2016   Procedure: CATARACT EXTRACTION PHACO AND INTRAOCULAR LENS PLACEMENT (IOC);  Surgeon: Birder Robson, MD;  Location: [facility] ORS;  Service: Ophthalmology;  Laterality: Right;  Lot# [id number] H [place]: 01:13.4 AP%: 24.4 CDE: 17.85  . CORONARY ANGIOPLASTY     STENT    OB History    No data available       Home Medications    Prior to Admission medications   Medication Sig Start Date End Date Taking? Authorizing Provider  acetaminophen (TYLENOL) 500 MG tablet Take 1 tablet by mouth at bedtime.    Yes Historical Provider, MD  albuterol (VENTOLIN HFA) 108 (90 Base) MCG/ACT inhaler Inhale 2 puffs into the lungs every 6 (six) hours as needed. 09/22/15  Yes Dennis E Chrismon, PA  aspirin 325 MG EC tablet Take 1 tablet by mouth at bedtime.    Yes Historical Provider, MD  bisacodyl (DULCOLAX) 5 MG EC tablet Take 1 tablet (5 mg total) by mouth daily as  needed for moderate constipation. 07/25/16  Yes Lytle Butte, MD  budesonide-formoterol [facility]) 160-4.5 MCG/ACT inhaler Inhale 2 puffs into the lungs 2 (two) times daily. 09/22/15  Yes Dennis E Chrismon, PA  Calcium Carb-Cholecalciferol (CALCIUM 600+D) 600-800 MG-UNIT TABS Take 2 tablets by mouth daily.   Yes Historical Provider, MD  clonazePAM (KLONOPIN) 0.5 MG tablet Take 1 tablet (0.5 mg total) by mouth 2 (two) times daily. 07/25/16  Yes Lytle Butte, MD    docusate sodium (COLACE) 100 MG capsule Take 200 mg by mouth at bedtime.   Yes Historical Provider, MD  DUREZOL 0.05 % EMUL Place 1 drop into the right eye 2 (two) times daily. 06/06/16  Yes Historical Provider, MD  ibuprofen (ADVIL,MOTRIN) 200 MG tablet Take 400 mg by mouth daily as needed for headache or moderate pain.    Yes Historical Provider, MD  PARoxetine (PAXIL) 10 MG tablet TAKE TWO TABLETS BY MOUTH AT BEDTIME DAILY Patient taking differently: take 10 mgs at night daily 01/21/16  Yes Dennis E Chrismon, PA  pravastatin (PRAVACHOL) 20 MG tablet Take 10 mg by mouth daily.  01/28/14  Yes Historical Provider, MD  senna-docusate (SENOKOT-S) 8.6-50 MG tablet Take 1 tablet by mouth at bedtime as needed for mild constipation. 07/25/16  Yes Lytle Butte, MD  tiotropium (SPIRIVA HANDIHALER) 18 MCG inhalation capsule Place 1 capsule (18 mcg total) into inhaler and inhale daily. 01/05/16  Yes Birdie Sons, MD  clonazePAM (KLONOPIN) 0.5 MG tablet Take 1 tablet (0.5 mg total) by mouth 2 (two) times daily as needed for anxiety. Patient not taking: Reported on 08/17/2016 01/05/16   Birdie Sons, MD  predniSONE (DELTASONE) 10 MG tablet '40mg'$  x1 day, '20mg'$  x2 day, '10mg'$  x2 day then stop Patient not taking: Reported on 08/17/2016 07/25/16   Lytle Butte, MD    Family History Family History  Problem Relation Age of Onset  . Hypertension Mother   . Diabetes Mother   . Colon cancer Mother     Social History Social History  Substance Use Topics  . Smoking status: Former Research scientist (life sciences)  . Smokeless tobacco: Never Used     Comment: QUIT IN 2002  . Alcohol use No     Allergies   Duloxetine hcl; Influenza vaccines; Other; and Venlafaxine   Review of Systems Review of Systems  Unable to perform ROS: Other  Constitutional: Negative for fever.  Respiratory: Negative for shortness of breath.   Cardiovascular: Negative for chest pain.  Gastrointestinal: Negative for abdominal pain.  Neurological:  Positive for speech difficulty. Negative for weakness, numbness and headaches.     Physical Exam Updated Vital Signs BP 179/89   Pulse 95   Temp 97.9 F (36.6 C)   Resp 19   Ht '5\' 5"'$  (1.651 m)   Wt 63.5 kg   SpO2 95%   BMI 23.30 kg/m   Physical Exam  Constitutional: She appears well-developed and well-nourished. No distress.  HENT:  Head: Normocephalic and atraumatic.  Mouth/Throat: Oropharynx is clear and moist.  Eyes: Conjunctivae and EOM are normal. Pupils are equal, round, and reactive to light.  Neck: Neck supple.  Cardiovascular: Normal rate, regular rhythm and intact distal pulses.   Murmur heard.  Systolic murmur is present with a grade of 2/6  No obvious carotid bruits  Pulmonary/Chest: Effort normal. No respiratory distress. She has wheezes (mild expiratory wheezing). She has no rales.  Abdominal: Soft. There is no tenderness.  Musculoskeletal: She exhibits no edema.  Neurological: She  is alert. No cranial nerve deficit or sensory deficit. Coordination normal. GCS eye subscore is 4. GCS verbal subscore is 5. GCS motor subscore is 6.  5/5 strength in all 4 extremities except for 3/5 R hand grip strength. Speech is word salad and garbled. Pt unable to repeat "I went to the store and bought some bread".  Skin: Skin is warm and dry.  Psychiatric: She has a normal mood and affect.  Nursing note and vitals reviewed.    ED Treatments / Results  Labs (all labs ordered are listed, but only abnormal results are displayed) Labs Reviewed  CBC - Abnormal; Notable for the following:       Result Value   RBC 3.82 (*)    Hemoglobin 11.6 (*)    HCT 34.9 (*)    All other components within normal limits  COMPREHENSIVE METABOLIC PANEL - Abnormal; Notable for the following:    Creatinine, Ser 1.04 (*)    Total Protein 6.0 (*)    Albumin 3.4 (*)    GFR calc non Af Amer 50 (*)    GFR calc Af Amer 58 (*)    All other components within normal limits  URINALYSIS, ROUTINE W  REFLEX MICROSCOPIC - Abnormal; Notable for the following:    Hgb urine dipstick SMALL (*)    Bacteria, UA RARE (*)    Squamous Epithelial / LPF 0-5 (*)    All other components within normal limits  PROTIME-INR  APTT  DIFFERENTIAL  RAPID URINE DRUG SCREEN, HOSP PERFORMED  I-STAT TROPOININ, ED    EKG  EKG Interpretation  Date/Time:  Wednesday August 17 2016 15:44:51 EST Ventricular Rate:  101 PR Interval:    QRS Duration: 80 QT Interval:  337 QTC Calculation: 437 R Axis:   35 Text Interpretation:  Sinus tachycardia No significant change since last tracing Confirmed by [facility] MD, PEDRO ([id number]) on 08/17/2016 4:30:37 PM       Radiology Ct Head Wo Contrast  Result Date: 08/17/2016 CLINICAL DATA:  Aphasia, right-sided weakness. EXAM: CT HEAD WITHOUT CONTRAST TECHNIQUE: Contiguous axial images were obtained from the base of the skull through the vertex without intravenous contrast. COMPARISON:  None. FINDINGS: Brain: Low density and sulcal effacement is seen involving the left temporal and parietal cortex consistent with acute infarction. No hemorrhage is noted. Probable old left cerebellar infarction is noted. No midline shift is noted. Vascular: Atherosclerosis of carotid siphons is noted. Skull: Normal. Negative for fracture or focal lesion. Sinuses/Orbits: Sphenoid sinusitis is noted. Other: None. 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 Sphenoid sinusitis. Electronically Signed   By: Marijo Conception, M.D.   On: 08/17/2016 16:41    Procedures Procedures (including critical care time)  Medications Ordered in ED Medications  prochlorperazine (COMPAZINE) injection 5 mg (5 mg Intravenous Given 08/17/16 1741)     Initial Impression / Assessment and Plan / ED Course  I have reviewed the triage vital signs and the nursing notes.  Pertinent labs & imaging results that were available during my care of the patient were reviewed by me and considered in  my medical decision making (see chart for details).  Clinical Course     Patient is a 81 year old female with history of COPD, CAD, depression who presents with strokelike symptoms. Patient was last normal at 7:30 PM last night last seen by the daughter. Patient's sister called her at 2 PM after missing a hair appointment noticed her speech was troubled on the phone.  Patient has no history of TIA or stroke. Next  On exam patient has speech consistent with Warnicke's aphasia and word salad. She also has right grip strength deficits. Strength is otherwise 5 out of 5 in all other x-ray testing. No obvious sensory deficits. No facial droop. Finger to nose intact.  Since patient is outside interventional window will plan for noncontrast CT head and standard stroke workup and consult neurology. Patient is outside window for systemic or IR targeted TPA at this time. Pt c/o headache, pt given compazine '5mg'$  IV.  Pt found to have acute L sided temporal/parietal lobe CVA. Pt to be admitted to hospitalist with neuro following.  Pt seen with attending Dr. Leonette Monarch.  Final Clinical Impressions(s) / ED Diagnoses   Final diagnoses:  Expressive aphasia  Right hand weakness  Acute CVA (cerebrovascular accident) [facility])    New Prescriptions New Prescriptions   No medications on file     Tobie Poet, DO 08/17/16 1845

## 2016-08-17 NOTE — ED Notes (Signed)
Helped pt use the bedside commode. Pt. Moved bowels and urinated Pt needed some assistance returning to bed.  Pt became winded and dropped 02 sats to 90 and was breathing with pursed lips. Pt's sats began to rebound once she was back in the bed but I placed her on 2L for support.

## 2016-08-17 NOTE — ED Triage Notes (Signed)
Per EMS, patient did not show up for hair appt so sister called to see what was going on approx. 2pm. Sister told EMS that patient was having slurred speech and confusion.  Patient is having scrambled speech that doesn't make sense.  Pt did not know sister or son.  No other deficits noted. Follows commands off and on.  See for hypoxemia recently but does not have O2 at home.  20G LAC.  160/80, 93 HR, 96% RA, CBG 111.  No hx of prior CVA.  Hx of COPD, Stent x 10 yrs.

## 2016-08-17 NOTE — Consult Note (Signed)
NEURO HOSPITALIST CONSULT NOTE   Requestig physician: Dr. Leonette Monarch   Reason for Consult: Acute stroke on CT   History obtained from:  Patient and family  HPI:                                                                                                                                          Stephanie [name] is an 81 y.o. female with hx as below was last seen normal last night and presented when daughter found her with expressive aphasia and L sided weakness. CTH showed acute infract in the L temp and parietal cortex region.  Past Medical History:  Diagnosis Date  . Arthritis   . Complication of anesthesia   . COPD (chronic obstructive pulmonary disease) ([place])   . Coronary artery disease   . Depression   . Dyspnea   . Edema   . Pneumonia    IN PAST  . PONV (postoperative nausea and vomiting)   . Wheezing     Past Surgical History:  Procedure Laterality Date  . BALLOON ANGIOPLASTY, ARTERY  05/17/2009  . CATARACT EXTRACTION W/PHACO Left 05/24/2016   Procedure: CATARACT EXTRACTION PHACO AND INTRAOCULAR LENS PLACEMENT (IOC);  Surgeon: Birder Robson, MD;  Location: [facility] ORS;  Service: Ophthalmology;  Laterality: Left;  Lot# [id number] H [place]:   00:52.3 AP%:   27.3 CDE:  14.26   . CATARACT EXTRACTION W/PHACO Right 06/14/2016   Procedure: CATARACT EXTRACTION PHACO AND INTRAOCULAR LENS PLACEMENT (IOC);  Surgeon: Birder Robson, MD;  Location: [facility] ORS;  Service: Ophthalmology;  Laterality: Right;  Lot# [id number] H [place]: 01:13.4 AP%: 24.4 CDE: 17.85  . CORONARY ANGIOPLASTY     STENT    Family History  Problem Relation Age of Onset  . Hypertension Mother   . Diabetes Mother   . Colon cancer Mother      Social History:  reports that she has quit smoking. She has never used smokeless tobacco. She reports that she does not drink alcohol or use drugs.  Allergies  Allergen Reactions  . Duloxetine Hcl   . Influenza Vaccines   . Other     STEROIDS   DON'T WORK FOR HER  . Venlafaxine     GI distress    MEDICATIONS:  I have reviewed the patient's current medications.   ROS:                                                                                                                                       History obtained from chart review  General ROS: negative for - chills, fatigue, fever, night sweats, weight gain or weight loss Psychological ROS: negative for - behavioral disorder, hallucinations, memory difficulties, mood swings or suicidal ideation Ophthalmic ROS: negative for - blurry vision, double vision, eye pain or loss of vision ENT ROS: negative for - epistaxis, nasal discharge, oral lesions, sore throat, tinnitus or vertigo Allergy and Immunology ROS: negative for - hives or itchy/watery eyes Hematological and Lymphatic ROS: negative for - bleeding problems, bruising or swollen lymph nodes Endocrine ROS: negative for - galactorrhea, hair pattern changes, polydipsia/polyuria or temperature intolerance Respiratory ROS: negative for - cough, hemoptysis, shortness of breath or wheezing Cardiovascular ROS: negative for - chest pain, dyspnea on exertion, edema or irregular heartbeat Gastrointestinal ROS: negative for - abdominal pain, diarrhea, hematemesis, nausea/vomiting or stool incontinence Genito-Urinary ROS: negative for - dysuria, hematuria, incontinence or urinary frequency/urgency Musculoskeletal ROS: negative for - joint swelling or muscular weakness Neurological ROS: as noted in HPI Dermatological ROS: negative for rash and skin lesion changes   Blood pressure 179/89, pulse 95, temperature 97.9 F (36.6 C), resp. rate 19, height '5\' 5"'$  (1.651 m), weight 63.5 kg (140 lb), SpO2 95 %.   Neurologic Examination:                                                                                                       HEENT-  Normocephalic, no lesions, without obvious abnormality.  Normal external eye and conjunctiva.  Normal TM's bilaterally.  Normal auditory canals and external ears. Normal external nose, mucus membranes and septum.  Normal pharynx. Cardiovascular- regular rate and rhythm, S1, S2 normal, no murmur, click, rub or gallop, pulses palpable throughout   Lungs- chest clear, no wheezing, rales, normal symmetric air entry, Heart exam - S1, S2 normal, no murmur, no gallop, rate regular Abdomen- soft, non-tender; bowel sounds normal; no masses,  no organomegaly  Neurological Examination Mental Status: Alert and awake, follows simple commands with some Expressive aphasia  Cranial Nerves: II: Discs flat bilaterally; Visual fields grossly normal, pupils equal, round, reactive to light and accommodation III,IV, VI: ptosis not present, extra-ocular motions intact bilaterally V,VII: Mild R facial droop, facial light touch sensation normal bilaterally VIII: hearing  normal bilaterally IX,X: uvula rises symmetrically XI: bilateral shoulder shrug XII: midline tongue extension Motor: Right : Upper extremity   5/5, mild drift and slowed fine motor skills    Left:     Upper extremity   5/5  Lower extremity   5/5     Lower extremity   5/5  Sensory: Pinprick and light touch intact throughout, bilaterally  Cerebellar: normal finger-to-nose,      Lab Results: Basic Metabolic Panel: No results for input(s): NA, K, CL, CO2, GLUCOSE, BUN, CREATININE, CALCIUM, MG, PHOS in the last 168 hours.  Liver Function Tests: No results for input(s): AST, ALT, ALKPHOS, BILITOT, PROT, ALBUMIN in the last 168 hours. No results for input(s): LIPASE, AMYLASE in the last 168 hours. No results for input(s): AMMONIA in the last 168 hours.  CBC: No results for input(s): WBC, NEUTROABS, HGB, HCT, MCV, PLT in the last 168 hours.  Cardiac Enzymes: No results for input(s): CKTOTAL, CKMB, CKMBINDEX, TROPONINI in the  last 168 hours.  Lipid Panel: No results for input(s): CHOL, TRIG, HDL, CHOLHDL, VLDL, LDLCALC in the last 168 hours.  CBG: No results for input(s): GLUCAP in the last 168 hours.  Microbiology: Results for orders placed or performed during the hospital encounter of 07/19/16  MRSA PCR Screening     Status: None   Collection Time: 07/19/16  9:31 PM  Result Value Ref Range Status   MRSA by PCR NEGATIVE NEGATIVE Final    Comment:        The GeneXpert MRSA Assay (FDA approved for NASAL specimens only), is one component of a comprehensive MRSA colonization surveillance program. It is not intended to diagnose MRSA infection nor to guide or monitor treatment for MRSA infections.   Culture, sputum-assessment     Status: None   Collection Time: 07/22/16  8:10 AM  Result Value Ref Range Status   Specimen Description EXPECTORATED SPUTUM  Final   Special Requests NONE  Final   Sputum evaluation THIS SPECIMEN IS ACCEPTABLE FOR SPUTUM CULTURE  Final   Report Status 07/22/2016 FINAL  Final  Culture, respiratory (NON-Expectorated)     Status: None   Collection Time: 07/22/16  8:10 AM  Result Value Ref Range Status   Specimen Description EXPECTORATED SPUTUM  Final   Special Requests NONE Reflexed from [id number]  Final   Gram Stain   Final    RARE WBC PRESENT, PREDOMINANTLY PMN FEW SQUAMOUS EPITHELIAL CELLS PRESENT FEW GRAM POSITIVE COCCI IN PAIRS RARE GRAM NEGATIVE COCCOBACILLI RARE GRAM POSITIVE RODS RARE YEAST    Culture   Final    Consistent with normal respiratory flora. Performed at [facility]    Report Status 07/25/2016 FINAL  Final    Coagulation Studies: No results for input(s): LABPROT, INR in the last 72 hours.  Imaging: Ct Head Wo Contrast  Result Date: 08/17/2016 CLINICAL DATA:  Aphasia, right-sided weakness. EXAM: CT HEAD WITHOUT CONTRAST TECHNIQUE: Contiguous axial images were obtained from the base of the skull through the vertex without intravenous  contrast. COMPARISON:  None. FINDINGS: Brain: Low density and sulcal effacement is seen involving the left temporal and parietal cortex consistent with acute infarction. No hemorrhage is noted. Probable old left cerebellar infarction is noted. No midline shift is noted. Vascular: Atherosclerosis of carotid siphons is noted. Skull: Normal. Negative for fracture or focal lesion. Sinuses/Orbits: Sphenoid sinusitis is noted. Other: None. 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 Sphenoid sinusitis. Electronically Signed   By: Jeneen Rinks  Murlean Caller, M.D.   On: 08/17/2016 16:41        Assessment/Plan: 81 y.o. female with hx as below was last seen normal last night and presented when daughter found her with expressive aphasia and L sided weakness. CTH showed acute infract in the L temp and parietal cortex region.  Appears to be cardio embolic in nature  1. [id number], fasting lipid panel 2. MRI, MRA  of the brain without contrast 3. PT consult, OT consult, Speech consult 4. Echocardiogram 5. Carotid dopplers 6. Prophylactic therapy-Antiplatelet med: Aspirin - dose 325 7. Risk factor modification 8. Telemetry monitoring 9. Frequent neuro checks 10 NPO until passes stroke swallow screen 11 please page stroke NP  Or  PA  Or MD from 8am -4 pm  as this patient from this time will be  followed by the stroke.   You can look them up on [email or web address]  Password [email or web address]

## 2016-08-17 NOTE — Progress Notes (Signed)
RT note:  Patient exhibits difficulty with self administration of Dulera Inhaler (presumably d/t CVA).  Would recommend changing her to nebulized equivalent.

## 2016-08-17 NOTE — H&P (Signed)
History and Physical    Stephanie [name] RJJ:[id number] DOB: [birth year] DOA: 08/17/2016  PCP: Vernie Murders, PA Consultants:  Pulmonology - ? Patient coming from: home - lives alone; [place]: daughter, [phone]  Chief Complaint: aphasia  HPI: Stephanie [name] is a 81 y.o. female with medical history significant of CAD and COPD on home O2 presenting with possible CVA.  Last night, she was fine when the daughter left.  She missed a hair appointment today and the beautician called her and couldn't understand her.  Her sister called and couldn't understand her.  They tried calling and she didn't answer the phone.  They went to check on her.  She did make a pot of coffee this AM.  Came downstairs but did not make her bed - very unsual for her.  Has been talking about people who have already died.  Speech is improving since arrival.  Expressive aphasia, almost jibberish.  No difficulty swallowing.  Right arm and leg weakness, mild, improving.  R facial droop, improved.  Had headache, improved.   ?some confusion.  Hospitalized within the last month due to COPD exacerbation, went to SNF rehab has been home for 2 weeks.  Breathing is normal for her today other than when she got up to use the bedside commode.  Takes ASA 325 mg daily.   ED Course: Per Dr. Nyoka Lint: Patient is a 81 year old female with history of COPD, CAD, depression who presents with strokelike symptoms. Patient was last normal at 7:30 PM last night last seen by the daughter. Patient's sister called her at 2 PM after missing a hair appointment noticed her speech was troubled on the phone. Patient has no history of TIA or stroke. Next  On exam patient has speech consistent with Warnicke's aphasia and word salad. She also has right grip strength deficits. Strength is otherwise 5 out of 5 in all other x-ray testing. No obvious sensory deficits. No facial droop. Finger to nose intact.  Since patient is outside interventional window will  plan for noncontrast CT head and standard stroke workup and consult neurology. Patient is outside window for systemic or IR targeted TPA at this time. Pt c/o headache, pt given compazine '5mg'$  IV.  Pt found to have acute L sided temporal/parietal lobe CVA. Pt to be admitted to hospitalist with neuro following.   Review of Systems: As per HPI; otherwise 10 point review of systems reviewed and negative.   Ambulatory Status:  ambulated independently prior  Past Medical History:  Diagnosis Date  . Arthritis   . Complication of anesthesia   . COPD (chronic obstructive pulmonary disease) ([place])   . Coronary artery disease 2013   stent  . Depression   . Edema   . Pneumonia    IN PAST  . PONV (postoperative nausea and vomiting)     Past Surgical History:  Procedure Laterality Date  . BALLOON ANGIOPLASTY, ARTERY  05/17/2009  . CATARACT EXTRACTION W/PHACO Left 05/24/2016   Procedure: CATARACT EXTRACTION PHACO AND INTRAOCULAR LENS PLACEMENT (IOC);  Surgeon: Birder Robson, MD;  Location: [facility] ORS;  Service: Ophthalmology;  Laterality: Left;  Lot# [id number] H [place]:   00:52.3 AP%:   27.3 CDE:  14.26   . CATARACT EXTRACTION W/PHACO Right 06/14/2016   Procedure: CATARACT EXTRACTION PHACO AND INTRAOCULAR LENS PLACEMENT (IOC);  Surgeon: Birder Robson, MD;  Location: [facility] ORS;  Service: Ophthalmology;  Laterality: Right;  Lot# [id number] H [place]: 01:13.4 AP%: 24.4 CDE: 17.85  . CORONARY ANGIOPLASTY     STENT  Social History   Social History  . Marital status: Widowed    Spouse name: N/A  . Number of children: N/A  . Years of education: N/A   Occupational History  . retired    Social History Main Topics  . Smoking status: Former Research scientist (life sciences)  . Smokeless tobacco: Never Used     Comment: QUIT IN 2002  . Alcohol use No  . Drug use: No  . Sexual activity: Not on file   Other Topics Concern  . Not on file   Social History Narrative  . No narrative on file    Allergies  Allergen Reactions   . Duloxetine Hcl     unknown  . Influenza Vaccines     unknown  . Other     STEROIDS  DON'T WORK FOR HER  . Venlafaxine     GI distress    Family History  Problem Relation Age of Onset  . Hypertension Mother   . Diabetes Mother   . Pancreatic cancer Mother 27  . Suicidality Father 10    Prior to Admission medications   Medication Sig Start Date End Date Taking? Authorizing Provider  acetaminophen (TYLENOL) 500 MG tablet Take 1 tablet by mouth at bedtime.    Yes Historical Provider, MD  albuterol (VENTOLIN HFA) 108 (90 Base) MCG/ACT inhaler Inhale 2 puffs into the lungs every 6 (six) hours as needed. 09/22/15  Yes Dennis E Chrismon, PA  aspirin 325 MG EC tablet Take 1 tablet by mouth at bedtime.    Yes Historical Provider, MD  bisacodyl (DULCOLAX) 5 MG EC tablet Take 1 tablet (5 mg total) by mouth daily as needed for moderate constipation. 07/25/16  Yes Lytle Butte, MD  budesonide-formoterol [facility]) 160-4.5 MCG/ACT inhaler Inhale 2 puffs into the lungs 2 (two) times daily. 09/22/15  Yes Dennis E Chrismon, PA  Calcium Carb-Cholecalciferol (CALCIUM 600+D) 600-800 MG-UNIT TABS Take 2 tablets by mouth daily.   Yes Historical Provider, MD  clonazePAM (KLONOPIN) 0.5 MG tablet Take 1 tablet (0.5 mg total) by mouth 2 (two) times daily. 07/25/16  Yes Lytle Butte, MD  docusate sodium (COLACE) 100 MG capsule Take 200 mg by mouth at bedtime.   Yes Historical Provider, MD  DUREZOL 0.05 % EMUL Place 1 drop into the right eye 2 (two) times daily. 06/06/16  Yes Historical Provider, MD  ibuprofen (ADVIL,MOTRIN) 200 MG tablet Take 400 mg by mouth daily as needed for headache or moderate pain.    Yes Historical Provider, MD  PARoxetine (PAXIL) 10 MG tablet TAKE TWO TABLETS BY MOUTH AT BEDTIME DAILY Patient taking differently: take 10 mgs at night daily 01/21/16  Yes Dennis E Chrismon, PA  pravastatin (PRAVACHOL) 20 MG tablet Take 10 mg by mouth daily.  01/28/14  Yes Historical Provider, MD    senna-docusate (SENOKOT-S) 8.6-50 MG tablet Take 1 tablet by mouth at bedtime as needed for mild constipation. 07/25/16  Yes Lytle Butte, MD  tiotropium (SPIRIVA HANDIHALER) 18 MCG inhalation capsule Place 1 capsule (18 mcg total) into inhaler and inhale daily. 01/05/16  Yes Birdie Sons, MD  clonazePAM (KLONOPIN) 0.5 MG tablet Take 1 tablet (0.5 mg total) by mouth 2 (two) times daily as needed for anxiety. Patient not taking: Reported on 08/17/2016 01/05/16   Birdie Sons, MD  predniSONE (DELTASONE) 10 MG tablet '40mg'$  x1 day, '20mg'$  x2 day, '10mg'$  x2 day then stop Patient not taking: Reported on 08/17/2016 07/25/16   Lytle Butte, MD  Physical Exam: Vitals:   08/17/16 2100 08/17/16 2140 08/17/16 2302 08/17/16 2306  BP: 149/82 (!) 171/73    Pulse: 91 60    Resp: 23 20    Temp:  98.8 F (37.1 C)    TempSrc:  Oral    SpO2: 98% 97% 99% 99%  Weight:  58.2 kg (128 lb 3.2 oz)    Height:  '5\' 2"'$  (1.575 m)       General: Appears calm and comfortable and is NAD Eyes:  PERRL, EOMI, normal lids, iris ENT:  grossly normal hearing, lips & tongue, mmm Neck:  no LAD, masses or thyromegaly Cardiovascular:  RRR, no m/r/g. No LE edema.  Respiratory:  CTA bilaterally, no w/r/r. Normal respiratory effort. Abdomen:  soft, ntnd, NABS Skin:  no rash or induration seen on limited exam Musculoskeletal:  R-sided weakness of upper > Lower extremity, good ROM, no bony abnormality Psychiatric:  grossly normal mood and affect, speech aphasic Neurologic:  CN 2-12 grossly intact, moves all extremities in coordinated fashion, sensation intact, there is some concern for right-sided neglect  Labs on Admission: I have personally reviewed following labs and imaging studies  CBC:  Recent Labs Lab 08/17/16 1752  WBC 7.3  NEUTROABS 5.8  HGB 11.6*  HCT 34.9*  MCV 91.4  PLT 762   Basic Metabolic Panel:  Recent Labs Lab 08/17/16 1752  NA 138  K 4.0  CL 104  CO2 23  GLUCOSE 93  BUN 13  CREATININE  1.04*  CALCIUM 10.0   GFR: Estimated Creatinine Clearance: 35.3 mL/min (by C-G formula based on SCr of 1.04 mg/dL (H)). Liver Function Tests:  Recent Labs Lab 08/17/16 1752  AST 20  ALT 19  ALKPHOS 59  BILITOT 0.3  PROT 6.0*  ALBUMIN 3.4*   No results for input(s): LIPASE, AMYLASE in the last 168 hours. No results for input(s): AMMONIA in the last 168 hours. Coagulation Profile:  Recent Labs Lab 08/17/16 1752  INR 0.92   Cardiac Enzymes:  Recent Labs Lab 08/17/16 2210  TROPONINI 0.04*   BNP (last 3 results) No results for input(s): PROBNP in the last 8760 hours. HbA1C: No results for input(s): HGBA1C in the last 72 hours. CBG: No results for input(s): GLUCAP in the last 168 hours. Lipid Profile: No results for input(s): CHOL, HDL, LDLCALC, TRIG, CHOLHDL, LDLDIRECT in the last 72 hours. Thyroid Function Tests: No results for input(s): TSH, T4TOTAL, FREET4, T3FREE, THYROIDAB in the last 72 hours. Anemia Panel: No results for input(s): VITAMINB12, FOLATE, FERRITIN, TIBC, IRON, RETICCTPCT in the last 72 hours. Urine analysis:    Component Value Date/Time   COLORURINE YELLOW 08/17/2016 [place] 08/17/2016 1634   LABSPEC 1.009 08/17/2016 1634   PHURINE 7.0 08/17/2016 1634   GLUCOSEU NEGATIVE 08/17/2016 1634   HGBUR SMALL (A) 08/17/2016 1634   BILIRUBINUR NEGATIVE 08/17/2016 1634   KETONESUR NEGATIVE 08/17/2016 1634   PROTEINUR NEGATIVE 08/17/2016 1634   NITRITE NEGATIVE 08/17/2016 1634   LEUKOCYTESUR NEGATIVE 08/17/2016 1634    Creatinine Clearance: Estimated Creatinine Clearance: 35.3 mL/min (by C-G formula based on SCr of 1.04 mg/dL (H)).  Sepsis Labs: '@LABRCNTIP'$ (procalcitonin:4,lacticidven:4) )No results found for this or any previous visit (from the past 240 hour(s)).   Radiological Exams on Admission: Ct Head Wo Contrast  Result Date: 08/17/2016 CLINICAL DATA:  Aphasia, right-sided weakness. EXAM: CT HEAD WITHOUT CONTRAST  TECHNIQUE: Contiguous axial images were obtained from the base of the skull through the vertex without intravenous contrast. COMPARISON:  None. FINDINGS: Brain: Low density and sulcal effacement is seen involving the left temporal and parietal cortex consistent with acute infarction. No hemorrhage is noted. Probable old left cerebellar infarction is noted. No midline shift is noted. Vascular: Atherosclerosis of carotid siphons is noted. Skull: Normal. Negative for fracture or focal lesion. Sinuses/Orbits: Sphenoid sinusitis is noted. Other: None. 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 Sphenoid sinusitis. Electronically Signed   By: Marijo Conception, M.D.   On: 08/17/2016 16:41    EKG: Independently reviewed.  Sinus tachycardia with rate 101;no evidence of acute ischemia  Assessment/Plan Principal Problem:   Acute CVA (cerebrovascular accident) ([place]) Active Problems:   HLD (hyperlipidemia)   COPD, moderate (HCC)   Elevated troponin   Mild malnutrition (HCC)   CVA -Uncertain length of time from onset of symptoms, presenting with aphasia and right-sided weakness concerning for TIA/CVA -CT confirms acute CVA of left temporal-parietal area -Will admit for CVA/TIA evaluation -Telemetry monitoring -MRI/MRA -Carotid dopplers -Echo -Risk stratification with FLP; will also check TSH  -Has been taking ASA daily so likely needs Plavix, but will defer to Neuro; for now Neuro recommends 325 mg ASA daily -PT/OT/ST/Nutrition Consults -SW consult for placement  HTN -Allow permissive HTN -Treat BP only if >220/120, and then with goal of 15% reduction -She does not appear to be taking chronic BP medications  HLD -Check FLP -Currently on Pravachol 10 mg daily -Consider increase in/change in statin therapy if FLP is not at goal  Elevated troponin -Troponin 0.04 -Likely demand ischemia, will trend  Malnutrition -Albumin 3.4 -Nutrition consult, as  above  COPD -Continue home therapies including O2  DVT prophylaxis:  Lovenox  Code Status: DNR - confirmed with patient/family Family Communication: Daughters present throughout evaluation Disposition Plan:  To be determined Consults called: Neurology Admission status: Admit - It is my clinical opinion that admission to INPATIENT is reasonable and necessary because this patient will require at least 2 midnights in the hospital to treat this condition based on the medical complexity of the problems presented.  Given the aforementioned information, the predictability of an adverse outcome is felt to be significant.  Karmen Bongo MD [facility]  If 7PM-7AM, please contact night-coverage [email or web address] Password [email or web address]  08/18/2016, 1:51 AM

## 2016-08-18 ENCOUNTER — Inpatient Hospital Stay (HOSPITAL_COMMUNITY): Payer: PPO

## 2016-08-18 ENCOUNTER — Encounter (HOSPITAL_COMMUNITY): Payer: Self-pay | Admitting: *Deleted

## 2016-08-18 DIAGNOSIS — E441 Mild protein-calorie malnutrition: Secondary | ICD-10-CM | POA: Diagnosis present

## 2016-08-18 DIAGNOSIS — J449 Chronic obstructive pulmonary disease, unspecified: Secondary | ICD-10-CM

## 2016-08-18 DIAGNOSIS — I63412 Cerebral infarction due to embolism of left middle cerebral artery: Secondary | ICD-10-CM

## 2016-08-18 DIAGNOSIS — R778 Other specified abnormalities of plasma proteins: Secondary | ICD-10-CM | POA: Diagnosis present

## 2016-08-18 DIAGNOSIS — R7989 Other specified abnormal findings of blood chemistry: Secondary | ICD-10-CM

## 2016-08-18 DIAGNOSIS — R748 Abnormal levels of other serum enzymes: Secondary | ICD-10-CM

## 2016-08-18 DIAGNOSIS — I10 Essential (primary) hypertension: Secondary | ICD-10-CM

## 2016-08-18 LAB — VAS US CAROTID
LCCADSYS: 98 cm/s
LEFT ECA DIAS: -12 cm/s
LEFT VERTEBRAL DIAS: 9 cm/s
LICAPDIAS: -26 cm/s
Left CCA dist dias: 18 cm/s
Left CCA prox dias: 21 cm/s
Left CCA prox sys: 105 cm/s
Left ICA dist dias: -21 cm/s
Left ICA dist sys: -89 cm/s
Left ICA prox sys: -99 cm/s
RCCAPDIAS: 15 cm/s
RCCAPSYS: 79 cm/s
RIGHT ECA DIAS: -12 cm/s
RIGHT VERTEBRAL DIAS: 4 cm/s
Right cca dist sys: -105 cm/s

## 2016-08-18 LAB — LIPID PANEL
CHOL/HDL RATIO: 2.2 ratio
Cholesterol: 222 mg/dL — ABNORMAL HIGH (ref 0–200)
HDL: 103 mg/dL (ref >40)
LDL Cholesterol: 99 mg/dL (ref 0–99)
TRIGLYCERIDES: 100 mg/dL (ref <150)
VLDL: 20 mg/dL (ref 0–40)

## 2016-08-18 LAB — TROPONIN I: Troponin I: 0.06 ng/mL (ref <0.03)

## 2016-08-18 LAB — TSH: TSH: 1.619 u[IU]/mL (ref 0.350–4.500)

## 2016-08-18 MED ORDER — ADULT MULTIVITAMIN W/MINERALS CH
1.0000 | ORAL_TABLET | Freq: Every day | ORAL | Status: DC
  Administered 2016-08-18 – 2016-08-22 (×5): 1 via ORAL
  Filled 2016-08-18 (×5): qty 1

## 2016-08-18 MED ORDER — LORAZEPAM 2 MG/ML IJ SOLN
1.0000 mg | Freq: Once | INTRAMUSCULAR | Status: DC
  Filled 2016-08-18: qty 1

## 2016-08-18 MED ORDER — ENSURE ENLIVE PO LIQD
237.0000 mL | Freq: Two times a day (BID) | ORAL | Status: DC
  Administered 2016-08-19 – 2016-08-20 (×2): 237 mL via ORAL

## 2016-08-18 MED ORDER — ATORVASTATIN CALCIUM 40 MG PO TABS
40.0000 mg | ORAL_TABLET | Freq: Every day | ORAL | Status: DC
  Administered 2016-08-19 – 2016-08-22 (×4): 40 mg via ORAL
  Filled 2016-08-18 (×4): qty 1

## 2016-08-18 NOTE — Care Management Note (Signed)
Case Management Note  Patient Details  Name: Stephanie [name] MRN: [id number] Date of Birth: 02-03-1936  Subjective/Objective:            Patient was admitted with CVA. Lives at home alone. CM will follow for discharge needs pending PT/OT evals and physician orders.         Action/Plan:   Expected Discharge Date:                  Expected Discharge Plan:     In-House Referral:     Discharge planning Services     Post Acute Care Choice:    Choice offered to:     DME Arranged:    DME Agency:     HH Arranged:    HH Agency:     Status of Service:     If discussed at [employer] of Stay Meetings, dates discussed:    Additional Comments:  Rolm Baptise, RN 08/18/2016, 10:21 AM

## 2016-08-18 NOTE — Progress Notes (Deleted)
PT Cancellation Note  Patient Details Name: Stephanie [name] MRN: [id number] DOB: July 25, 1936   Cancelled Treatment:    Reason Eval/Treat Not Completed: Fatigue/lethargy limiting ability to participate;Medical issues which prohibited therapy; too nauseated to participate this after noon.  Did educate on visual compensation due to dizziness with mobility.  Will follow up next date.    Reginia Naas 08/18/2016, 3:35 PM Magda Kiel, [place] 08/18/2016

## 2016-08-18 NOTE — Evaluation (Signed)
Physical Therapy Evaluation Patient Details Name: Stephanie [name] MRN: [id number] DOB: 10-26-1935 Today's Date: 08/18/2016   History of Present Illness  81 y.o. female with medical history significant of CAD and COPD on home O2 presenting with possible CVA. Pt found to have acute L sided temporal/parietal lobe CVA  Clinical Impression  Patient presents with decreased independence with mobility due to deficits listed in PT problem list.  She will benefit from skilled PT in the acute setting to allow return home with family support following CIR level rehab stay.  Mainly with R side field cut and decreased awareness of that side running into wall and door during ambulation.  Did note some SOB with SpO2 93% on room air.     Follow Up Recommendations CIR    Equipment Recommendations  None recommended by PT    Recommendations for Other Services Rehab consult     Precautions / Restrictions Precautions Precautions: Fall Precaution Comments: Rt field cut Restrictions Weight Bearing Restrictions: No      Mobility  Bed Mobility               General bed mobility comments: received in recliner   Transfers Overall transfer level: Needs assistance Equipment used: None Transfers: Sit to/from Stand Sit to Stand: Min assist;Min guard         General transfer comment: for safety, unsteady  Ambulation/Gait Ambulation/Gait assistance: Min guard Ambulation Distance (Feet): 120 Feet Assistive device: None Gait Pattern/deviations: Decreased stride length;Shuffle;Drifts right/left     General Gait Details: ran into wall on R minguard for safety, also assist around door back into room due to R visual and awareness deficits  Stairs            Wheelchair Mobility    Modified Rankin (Stroke Patients Only) Modified Rankin (Stroke Patients Only) Pre-Morbid Rankin Score: No symptoms Modified Rankin: Moderately severe disability     Balance                                              Pertinent Vitals/Pain Pain Assessment: No/denies pain    Home Living Family/patient expects to be discharged to:: Private residence Living Arrangements: Alone Available Help at Discharge: Family;Available PRN/intermittently (daughters both work) Type of Home: Apartment Home Access: Stairs to enter   Technical brewer of Steps: 2-3 small steps Home Layout: Two level Home Equipment: Tub bench;Grab bars - tub/shower;Hand held shower head      Prior Function Level of Independence: Independent         Comments: Pt is very active still driving and running all her errands independently     Hand Dominance   Dominant Hand: Right    Extremity/Trunk Assessment   Upper Extremity Assessment Upper Extremity Assessment: Defer to OT evaluation    Lower Extremity Assessment Lower Extremity Assessment: Generalized weakness    Cervical / Trunk Assessment Cervical / Trunk Assessment: Kyphotic  Communication   Communication: Expressive difficulties (aphasia)  Cognition Arousal/Alertness: Awake/alert Behavior During Therapy: WFL for tasks assessed/performed Overall Cognitive Status: Difficult to assess Area of Impairment: Attention;Awareness   Current Attention Level: Sustained       Awareness:  (decreased R side awareness)   General Comments: Wernicke's aphasia    General Comments      Exercises     Assessment/Plan    PT Assessment Patient needs continued PT services  PT  Problem List Decreased mobility;Decreased strength;Impaired sensation;Decreased balance;Decreased coordination          PT Treatment Interventions DME instruction;Gait training;Stair training;Balance training;Functional mobility training;Therapeutic exercise;Therapeutic activities;Patient/family education    PT Goals (Current goals can be found in the Care Plan section)  Acute Rehab PT Goals Patient Stated Goal: TO go to rehab PT Goal Formulation: With  patient/family Time For Goal Achievement: 08/25/16    Frequency Min 4X/week   Barriers to discharge        Co-evaluation               End of Session Equipment Utilized During Treatment: Gait belt Activity Tolerance: Patient tolerated treatment well Patient left: with call bell/phone within reach;with family/visitor present;in chair           Time: 2767-0110 PT Time Calculation (min) (ACUTE ONLY): 24 min   Charges:   PT Evaluation $PT Eval Moderate Complexity: 1 Procedure PT Treatments $Gait Training: 8-22 mins   PT G CodesReginia Naas 2016/08/27, 3:42 PM  Magda Kiel, [place] 2016/08/27

## 2016-08-18 NOTE — Progress Notes (Signed)
VASCULAR LAB PRELIMINARY  PRELIMINARY  PRELIMINARY  PRELIMINARY  Carotid duplex completed.    Preliminary report:  1-39% ICA stenosis. Vertebral artery flow is antegrade.  Barney Russomanno, RVT 08/18/2016, 11:38 AM

## 2016-08-18 NOTE — Progress Notes (Signed)
Rehab Admissions Coordinator Note:  Patient was screened by Retta Diones for appropriateness for an Inpatient Acute Rehab Consult.  At this time, we are recommending Inpatient Rehab consult.  Retta Diones 08/18/2016, 4:09 PM  I can be reached at [phone].

## 2016-08-18 NOTE — Progress Notes (Addendum)
PROGRESS NOTE [facility]   Stephanie [name]   JQG:[id number] DOB: January 24, 1936  DOA: 08/17/2016 PCP: Vernie Murders, PA   Brief Narrative:  81 year old female with medical history significant for CAD and COPD not on home oxygen presented to the ED with aphasia and left-sided weakness, CT head showed acute infarct in the left temporal and parietal cortex. Patient admitted for further evaluation, neurology was consulted TPA was not given due to delay in arrival.  Subjective: Patient seen and examined daughters at bedside, she complains of visual field cut and continues to have difficulty speaking. 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 Expressive aphasia and loss of right visual field MRI/MRA - shows MCA infarct/moderate stenosis versus clot left M2 Carotid Doppler no significant stenosis Echo pending TEE in the morning - if negative we'll consult electrophysiologist for implantable loop recorder Aspirin 325 daily PT recommending CIR Neurology recommendations appreciated LDL mild elevated will increase statin prior to d/c   Hypertension Allow permissive hypertension Monitor BP Treat BP only if > 220/120 Patient not taking chronic BP medications  Elevated troponin Likely secondary to demand ischemia, no EKG changes, one is now back to normal  COPD - asymptomatic at this time Continue home medications - DuoNeb and Dulera  DVT prophylaxis: Lovenox Code Status: Full Family Communication: Family at bedside Disposition Plan: SNF versus CIR   Consultants:   Neurology   Procedures:   ECHO - Pending   Antimicrobials:  None   Objective: Vitals:   08/18/16 0600 08/18/16 0957 08/18/16 1309 08/18/16 1658  BP: 132/72 (!) 129/53 (!) 102/45 (!) 116/54  Pulse: 72 94 86 84  Resp: '18 20 20 20  '$ Temp: 98.9 F (37.2 C) 97.5 F (36.4 C) 98.1 F (36.7 C) 98 F (36.7 C)    TempSrc: Oral Oral Oral Oral  SpO2: 99% 97% 94% 95%  Weight:      Height:        Intake/Output Summary (Last 24 hours) at 08/18/16 1733 Last data filed at 08/18/16 0300  Gross per 24 hour  Intake           376.67 ml  Output              200 ml  Net           176.67 ml   Filed Weights   08/17/16 1542 08/17/16 2140  Weight: 63.5 kg (140 lb) 58.2 kg (128 lb 3.2 oz)    Examination:  General exam: Appears calm and comfortable  HEENT: Right hemianopsia Respiratory system: Clear to auscultation. No wheezes,crackle or rhonchi Cardiovascular system: S1 & S2 heard, RRR. No JVD, murmurs, rubs or gallops Gastrointestinal system: Abdomen is nondistended, soft and nontender. No organomegaly or masses felt. Normal bowel sounds heard. Central nervous system: Alert and oriented. Expressive aphasia, mild right site weakness upper > lower, good ROM Extremities: No pedal edema. Symmetric, strength 4/5 L > R   Skin: No rashes, lesions or ulcers Psychiatry: Judgement and insight appear normal. Mood & affect appropriate.    Data Reviewed: I have personally reviewed following labs and imaging studies  CBC:  Recent Labs Lab 08/17/16 1752  WBC 7.3  NEUTROABS 5.8  HGB 11.6*  HCT 34.9*  MCV 91.4  PLT 197   Basic Metabolic Panel:  Recent Labs Lab 08/17/16 1752  NA 138  K 4.0  CL 104  CO2 23  GLUCOSE 93  BUN 13  CREATININE 1.04*  CALCIUM 10.0   GFR: Estimated Creatinine Clearance: 35.3 mL/min (by C-G formula based on SCr of 1.04 mg/dL (H)). Liver Function Tests:  Recent Labs Lab 08/17/16 1752  AST 20  ALT 19  ALKPHOS 59  BILITOT 0.3  PROT 6.0*  ALBUMIN 3.4*   No results for input(s): LIPASE, AMYLASE in the last 168 hours. No results for input(s): AMMONIA in the last 168 hours. Coagulation Profile:  Recent Labs Lab 08/17/16 1752  INR 0.92   Cardiac Enzymes:  Recent Labs Lab 08/17/16 2210 08/18/16 0333 08/18/16 0945  TROPONINI 0.04* 0.06* <0.03   BNP (last  3 results) No results for input(s): PROBNP in the last 8760 hours. HbA1C: No results for input(s): HGBA1C in the last 72 hours. CBG: No results for input(s): GLUCAP in the last 168 hours. Lipid Profile:  Recent Labs  08/18/16 0333  CHOL 222*  HDL 103  LDLCALC 99  TRIG 100  CHOLHDL 2.2   Thyroid Function Tests:  Recent Labs  08/18/16 0333  TSH 1.619   Anemia Panel: No results for input(s): VITAMINB12, FOLATE, FERRITIN, TIBC, IRON, RETICCTPCT in the last 72 hours. Sepsis Labs: No results for input(s): PROCALCITON, LATICACIDVEN in the last 168 hours.  No results found for this or any previous visit (from the past 240 hour(s)).    Radiology Studies: Ct Head Wo Contrast  Result Date: 08/17/2016 CLINICAL DATA:  Aphasia, right-sided weakness. EXAM: CT HEAD WITHOUT CONTRAST TECHNIQUE: Contiguous axial images were obtained from the base of the skull through the vertex without intravenous contrast. COMPARISON:  None. FINDINGS: Brain: Low density and sulcal effacement is seen involving the left temporal and parietal cortex consistent with acute infarction. No hemorrhage is noted. Probable old left cerebellar infarction is noted. No midline shift is noted. Vascular: Atherosclerosis of carotid siphons is noted. Skull: Normal. Negative for fracture or focal lesion. Sinuses/Orbits: Sphenoid sinusitis is noted. Other: None. 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 Sphenoid sinusitis. Electronically Signed   By: Marijo Conception, M.D.   On: 08/17/2016 16:41   Mr Brain Wo Contrast  Result Date: 08/18/2016 CLINICAL DATA:  Possible CVA. Right facial droop. Right arm and leg weakness. EXAM: MRI HEAD WITHOUT CONTRAST MRA HEAD WITHOUT CONTRAST TECHNIQUE: Multiplanar, multiecho pulse sequences of the brain and surrounding structures were obtained without intravenous contrast. Angiographic images of the head were obtained using MRA technique without contrast.  COMPARISON:  Head CT 08/17/2016 FINDINGS: MRI HEAD FINDINGS Brain: There is diffusion restriction within the posterior left MCA distribution, involving the posterior left frontal lobe, the left insular cortex and the left parietal and temporal lobes. There is no associated hemorrhage. There is hyperintense T2 weighted signal within the corresponding distribution of the ischemia. There is otherwise mild periventricular T2 hyperintensity, but no other focal parenchymal signal abnormality. No mass lesion or midline shift. No hydrocephalus or extra-axial fluid collection. The midline structures are normal. No age advanced or lobar predominant atrophy. Vascular: Major intracranial arterial and venous sinus flow voids are preserved. No evidence of chronic microhemorrhage or amyloid angiopathy. Skull and upper cervical spine: The visualized skull base, calvarium, upper cervical spine and extracranial soft tissues are normal. Sinuses/Orbits: There is fluid within the left sphenoid sinus. Normal orbits. MRA HEAD FINDINGS Intracranial internal carotid arteries: Normal. Anterior cerebral arteries: Normal. Middle cerebral arteries: There is multifocal moderate narrowing of both middle cerebral artery M2 segments. Posterior communicating arteries: Present bilaterally. Posterior cerebral arteries: Bilateral fetal origins, but otherwise  normal. Basilar artery: Normal. Vertebral arteries: Left dominant. The right vertebral artery is very diminutive and may terminate in the posterior inferior cerebellar artery. Superior cerebellar arteries: Normal. Anterior inferior cerebellar arteries: Not visualized, which is not uncommon. Posterior inferior cerebellar arteries: Normal. IMPRESSION: 1. Acute infarct within the posterior left MCA distribution. No hemorrhage or mass effect. 2. Multifocal moderate stenosis within the bilateral MCA M2 distributions. No proximal intracranial arterial occlusion. Electronically Signed   By: Ulyses Jarred  M.D.   On: 08/18/2016 06:05   Mr Jodene Nam Head/brain TD Cm  Result Date: 08/18/2016 CLINICAL DATA:  Possible CVA. Right facial droop. Right arm and leg weakness. EXAM: MRI HEAD WITHOUT CONTRAST MRA HEAD WITHOUT CONTRAST TECHNIQUE: Multiplanar, multiecho pulse sequences of the brain and surrounding structures were obtained without intravenous contrast. Angiographic images of the head were obtained using MRA technique without contrast. COMPARISON:  Head CT 08/17/2016 FINDINGS: MRI HEAD FINDINGS Brain: There is diffusion restriction within the posterior left MCA distribution, involving the posterior left frontal lobe, the left insular cortex and the left parietal and temporal lobes. There is no associated hemorrhage. There is hyperintense T2 weighted signal within the corresponding distribution of the ischemia. There is otherwise mild periventricular T2 hyperintensity, but no other focal parenchymal signal abnormality. No mass lesion or midline shift. No hydrocephalus or extra-axial fluid collection. The midline structures are normal. No age advanced or lobar predominant atrophy. Vascular: Major intracranial arterial and venous sinus flow voids are preserved. No evidence of chronic microhemorrhage or amyloid angiopathy. Skull and upper cervical spine: The visualized skull base, calvarium, upper cervical spine and extracranial soft tissues are normal. Sinuses/Orbits: There is fluid within the left sphenoid sinus. Normal orbits. MRA HEAD FINDINGS Intracranial internal carotid arteries: Normal. Anterior cerebral arteries: Normal. Middle cerebral arteries: There is multifocal moderate narrowing of both middle cerebral artery M2 segments. Posterior communicating arteries: Present bilaterally. Posterior cerebral arteries: Bilateral fetal origins, but otherwise normal. Basilar artery: Normal. Vertebral arteries: Left dominant. The right vertebral artery is very diminutive and may terminate in the posterior inferior cerebellar  artery. Superior cerebellar arteries: Normal. Anterior inferior cerebellar arteries: Not visualized, which is not uncommon. Posterior inferior cerebellar arteries: Normal. IMPRESSION: 1. Acute infarct within the posterior left MCA distribution. No hemorrhage or mass effect. 2. Multifocal moderate stenosis within the bilateral MCA M2 distributions. No proximal intracranial arterial occlusion. Electronically Signed   By: Ulyses Jarred M.D.   On: 08/18/2016 06:05    Scheduled Meds: . aspirin  325 mg Oral QHS  . clonazePAM  0.5 mg Oral BID  . enoxaparin (LOVENOX) injection  40 mg Subcutaneous Q24H  . [START ON 08/19/2016] feeding supplement (ENSURE ENLIVE)  237 mL Oral BID BM  . ipratropium-albuterol  3 mL Nebulization TID  . LORazepam  1 mg Intravenous Once  . mometasone-formoterol  2 puff Inhalation BID  . multivitamin with minerals  1 tablet Oral Daily  . PARoxetine  10 mg Oral QHS  . pravastatin  10 mg Oral q1800   Continuous Infusions: . sodium chloride 50 mL/hr at 08/18/16 0016     LOS: 1 day    Chipper Oman, MD [facility] Pager [phone]  If 7PM-7AM, please contact night-coverage [email or web address] Password [facility] 08/18/2016, 5:33 PM

## 2016-08-18 NOTE — Progress Notes (Signed)
STROKE TEAM PROGRESS NOTE   HISTORY OF PRESENT ILLNESS (per record) Stephanie [name] is an 81 y.o. female who was last seen normal last night 08/16/2016 and presented when daughter found her with expressive aphasia and L sided weakness. CTH showed acute infract in the L temp and parietal cortex region.   Patient was not administered IV t-PA secondary to delay in arrival. She was admitted for further evaluation and treatment.   SUBJECTIVE (INTERVAL HISTORY) Her 2 daughters, 1 son and 2 granddaughters are at the bedside. Patient is sitting up in the chair. She lives alone and kids live nearby. She has a field cut and expressive aphasia Overall she feels her condition is stable.    OBJECTIVE Temp:  [97.5 F (36.4 C)-99.8 F (37.7 C)] 97.5 F (36.4 C) (01/11 0957) Pulse Rate:  [60-100] 94 (01/11 0957) Cardiac Rhythm: Normal sinus rhythm (01/11 0700) Resp:  [15-23] 20 (01/11 0957) BP: (121-179)/(53-121) 129/53 (01/11 0957) SpO2:  [91 %-100 %] 97 % (01/11 0957) Weight:  [58.2 kg (128 lb 3.2 oz)-63.5 kg (140 lb)] 58.2 kg (128 lb 3.2 oz) (01/10 2140)  CBC:  Recent Labs Lab 08/17/16 1752  WBC 7.3  NEUTROABS 5.8  HGB 11.6*  HCT 34.9*  MCV 91.4  PLT 595    Basic Metabolic Panel:  Recent Labs Lab 08/17/16 1752  NA 138  K 4.0  CL 104  CO2 23  GLUCOSE 93  BUN 13  CREATININE 1.04*  CALCIUM 10.0    Lipid Panel:    Component Value Date/Time   CHOL 222 (H) 08/18/2016 0333   CHOL 203 (H) 07/06/2016 0946   TRIG 100 08/18/2016 0333   HDL 103 08/18/2016 0333   HDL 112 07/06/2016 0946   CHOLHDL 2.2 08/18/2016 0333   VLDL 20 08/18/2016 0333   LDLCALC 99 08/18/2016 0333   LDLCALC 77 07/06/2016 0946   HgbA1c: No results found for: HGBA1C Urine Drug Screen:    Component Value Date/Time   LABOPIA NONE DETECTED 08/17/2016 1634   COCAINSCRNUR NONE DETECTED 08/17/2016 1634   LABBENZ NONE DETECTED 08/17/2016 1634   AMPHETMU NONE DETECTED 08/17/2016 1634   THCU NONE DETECTED  08/17/2016 1634   LABBARB NONE DETECTED 08/17/2016 1634      IMAGING  Ct Head Wo Contrast  Result Date: 08/17/2016 CLINICAL DATA:  Aphasia, right-sided weakness. EXAM: CT HEAD WITHOUT CONTRAST TECHNIQUE: Contiguous axial images were obtained from the base of the skull through the vertex without intravenous contrast. COMPARISON:  None. FINDINGS: Brain: Low density and sulcal effacement is seen involving the left temporal and parietal cortex consistent with acute infarction. No hemorrhage is noted. Probable old left cerebellar infarction is noted. No midline shift is noted. Vascular: Atherosclerosis of carotid siphons is noted. Skull: Normal. Negative for fracture or focal lesion. Sinuses/Orbits: Sphenoid sinusitis is noted. Other: None. 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 Sphenoid sinusitis. Electronically Signed   By: Marijo Conception, M.D.   On: 08/17/2016 16:41   Mr Brain Wo Contrast  Result Date: 08/18/2016 CLINICAL DATA:  Possible CVA. Right facial droop. Right arm and leg weakness. EXAM: MRI HEAD WITHOUT CONTRAST MRA HEAD WITHOUT CONTRAST TECHNIQUE: Multiplanar, multiecho pulse sequences of the brain and surrounding structures were obtained without intravenous contrast. Angiographic images of the head were obtained using MRA technique without contrast. COMPARISON:  Head CT 08/17/2016 FINDINGS: MRI HEAD FINDINGS Brain: There is diffusion restriction within the posterior left MCA distribution, involving the posterior left frontal lobe,  the left insular cortex and the left parietal and temporal lobes. There is no associated hemorrhage. There is hyperintense T2 weighted signal within the corresponding distribution of the ischemia. There is otherwise mild periventricular T2 hyperintensity, but no other focal parenchymal signal abnormality. No mass lesion or midline shift. No hydrocephalus or extra-axial fluid collection. The midline structures are normal. No age  advanced or lobar predominant atrophy. Vascular: Major intracranial arterial and venous sinus flow voids are preserved. No evidence of chronic microhemorrhage or amyloid angiopathy. Skull and upper cervical spine: The visualized skull base, calvarium, upper cervical spine and extracranial soft tissues are normal. Sinuses/Orbits: There is fluid within the left sphenoid sinus. Normal orbits. MRA HEAD FINDINGS Intracranial internal carotid arteries: Normal. Anterior cerebral arteries: Normal. Middle cerebral arteries: There is multifocal moderate narrowing of both middle cerebral artery M2 segments. Posterior communicating arteries: Present bilaterally. Posterior cerebral arteries: Bilateral fetal origins, but otherwise normal. Basilar artery: Normal. Vertebral arteries: Left dominant. The right vertebral artery is very diminutive and may terminate in the posterior inferior cerebellar artery. Superior cerebellar arteries: Normal. Anterior inferior cerebellar arteries: Not visualized, which is not uncommon. Posterior inferior cerebellar arteries: Normal. IMPRESSION: 1. Acute infarct within the posterior left MCA distribution. No hemorrhage or mass effect. 2. Multifocal moderate stenosis within the bilateral MCA M2 distributions. No proximal intracranial arterial occlusion. Electronically Signed   By: Ulyses Jarred M.D.   On: 08/18/2016 06:05   Mr Jodene Nam Head/brain GY Cm  Result Date: 08/18/2016 CLINICAL DATA:  Possible CVA. Right facial droop. Right arm and leg weakness. EXAM: MRI HEAD WITHOUT CONTRAST MRA HEAD WITHOUT CONTRAST TECHNIQUE: Multiplanar, multiecho pulse sequences of the brain and surrounding structures were obtained without intravenous contrast. Angiographic images of the head were obtained using MRA technique without contrast. COMPARISON:  Head CT 08/17/2016 FINDINGS: MRI HEAD FINDINGS Brain: There is diffusion restriction within the posterior left MCA distribution, involving the posterior left frontal  lobe, the left insular cortex and the left parietal and temporal lobes. There is no associated hemorrhage. There is hyperintense T2 weighted signal within the corresponding distribution of the ischemia. There is otherwise mild periventricular T2 hyperintensity, but no other focal parenchymal signal abnormality. No mass lesion or midline shift. No hydrocephalus or extra-axial fluid collection. The midline structures are normal. No age advanced or lobar predominant atrophy. Vascular: Major intracranial arterial and venous sinus flow voids are preserved. No evidence of chronic microhemorrhage or amyloid angiopathy. Skull and upper cervical spine: The visualized skull base, calvarium, upper cervical spine and extracranial soft tissues are normal. Sinuses/Orbits: There is fluid within the left sphenoid sinus. Normal orbits. MRA HEAD FINDINGS Intracranial internal carotid arteries: Normal. Anterior cerebral arteries: Normal. Middle cerebral arteries: There is multifocal moderate narrowing of both middle cerebral artery M2 segments. Posterior communicating arteries: Present bilaterally. Posterior cerebral arteries: Bilateral fetal origins, but otherwise normal. Basilar artery: Normal. Vertebral arteries: Left dominant. The right vertebral artery is very diminutive and may terminate in the posterior inferior cerebellar artery. Superior cerebellar arteries: Normal. Anterior inferior cerebellar arteries: Not visualized, which is not uncommon. Posterior inferior cerebellar arteries: Normal. IMPRESSION: 1. Acute infarct within the posterior left MCA distribution. No hemorrhage or mass effect. 2. Multifocal moderate stenosis within the bilateral MCA M2 distributions. No proximal intracranial arterial occlusion. Electronically Signed   By: Ulyses Jarred M.D.   On: 08/18/2016 06:05    Carotid Doppler   There is 1-39% bilateral ICA stenosis. Vertebral artery flow is antegrade.     PHYSICAL EXAM A  pleasant elderly lady not  in distress. . Afebrile. Head is nontraumatic. Neck is supple without bruit.    Cardiac exam no murmur or gallop. Lungs are clear to auscultation. Distal pulses are well felt. Neurological Exam :  Awake alert moderate expressive aphasia. Follows simple midline and one step commands. Extraocular movements are full range.no clear nystagmus. Dense right homonymous hemianopsia. Mild right lower facial weakness. Tongue midline. Motor system exam shows no drift but mild weakness and of the right hand grip, intrinsic hand muscles, right hip and ankle dorsiflexors only. Normal strength on the left. ASSESSMENT/PLAN [name] is a 81 y.o. female with history of COPD presenting with aphasia and field cut. She did not receive IV t-PA due to delay in arrival.   Stroke:  Large left posterior MCA infarct, embolic versus large vessel disease. Workup underway.  Resultant  expressive and receptive aphasia, right visual field  MRI  Large left posterior MCA infarct  MRA  moderate stenosis vs clot left M2, poorly visualized bilateral vessels  Carotid Doppler  No significant stenosis   2D Echo  pending   TEE to look for embolic source. Arranged with [facility] for tomorrow.  If positive for PFO (patent foramen ovale), check bilateral lower extremity venous dopplers to rule out DVT as possible source of stroke. (I have made patient NPO after midnight tonight).   If TEE negative, a [place] electrophysiologist will consult and consider placement of an implantable loop recorder to evaluate for atrial fibrillation as etiology of stroke. This has been explained to patient/family by Leonie Man and they are agreeable.   LDL 99  HgbA1c pending  Lovenox 40 mg sq daily for VTE prophylaxis  Diet Heart Room service appropriate? Yes; Fluid consistency: Thin  aspirin 81 mg daily prior to admission, now on aspirin 325 mg daily  Patient counseled to be  compliant with her antithrombotic medications  Ongoing aggressive stroke risk factor management  Therapy recommendations:  CIR. consult in place  Disposition:  pending   Followed by Dr. Melrose Nakayama, neurologist in [place] for memory loss - super mild, 30/30 on MME  Hypertension  Stable  Permissive hypertension (OK if < 220/120) but gradually normalize in 5-7 days  Long-term BP goal normotensive  Hyperlipidemia  Home meds:  pravachol 20, resumed in hospital  LDL 99, goal < 70  Consider increase pravachol dose  Continue statin at discharge  Other Stroke Risk Factors  Advanced age  Coronary artery disease - stent, angioplasty  Other Active Problems  Elevated tropinin  Malnutrition   COPD  Hospital day # [place] for Pager information 08/18/2016 5:32 PM  I have personally examined this patient, reviewed notes, independently viewed imaging studies, participated in medical decision making and plan of care.ROS completed by me personally and pertinent positives fully documented  I have made any additions or clarifications directly to the above note. Agree with note above. Recommend TEE and group recorder insertion for paroxysmal atrial fibrillation. Long discussion with multiple family members at the bedside and answered questions. Greater than 50% time during this 35 minute visit was spent on counseling and coordination of care about stroke, risk, evaluation plan and answering questions Antony Contras, MD Medical Director [place] Pager: [phone] 08/18/2016 7:57 PM  To contact Stroke Continuity provider, please refer to [email or web address] After hours, contact General Neurology

## 2016-08-18 NOTE — Progress Notes (Signed)
Initial Nutrition Assessment  INTERVENTION:  Ensure Enlive po BID, each supplement provides 350 kcal and 20 grams of protein Multivitamin with minerals daily  NUTRITION DIAGNOSIS:   Inadequate oral intake related to dysphagia, poor appetite as evidenced by meal completion < 50%.   GOAL:   Patient will meet greater than or equal to 90% of their needs   MONITOR:   PO intake, Supplement acceptance, Labs, Weight trends, I & O's, Skin  REASON FOR ASSESSMENT:   Consult  (CVA)  ASSESSMENT:   81 y.o. female with medical history significant of CAD and COPD on home O2 presenting with possible CVA.  Pt found to have acute L sided temporal/parietal lobe CVA.  Pt difficult to assess due to aphasia. Pt states that she has had a decreased appetite for several months; some days she eats well, sometimes she doesn't. She is unable to state usual body weight. Family at bedside state that patient has been maintaining her weight. When they eat with patient she usually eats well and snacks on yogurt often, but today pt has been eating <50% of meals. Family states that patient seems to be having difficulty with solid food and may benefit from a liquid nutritional supplement. RD brought pt an Ensure to drink after her breathing treatment with respiratory therapist.   Pt not appropriate for diet education at this time. RD provided "Cholesterol-Lowering Nutrition Therapy" handout from the Academy of [employer] with RD name and contact information  Labs: elevated cholesterol  Diet Order:  Diet Heart Room service appropriate? Yes; Fluid consistency: Thin Diet NPO time specified  Skin:  Reviewed, no issues  Last BM:  1/10  Height:   Ht Readings from Last 1 Encounters:  08/17/16 '5\' 2"'$  (1.575 m)    Weight:   Wt Readings from Last 1 Encounters:  08/17/16 128 lb 3.2 oz (58.2 kg)    Ideal Body Weight:  50 kg  BMI:  Body mass index is 23.45 kg/m.  Estimated Nutritional Needs:   Kcal:   1400-1600  Protein:  75-85 grams  Fluid:  1.6 L/day  EDUCATION NEEDS:   No education needs identified at this time  [place], CSP, LDN Inpatient Clinical Dietitian Pager: [phone] After Hours Pager: [phone]

## 2016-08-18 NOTE — Progress Notes (Signed)
PT Cancellation Note  Patient Details Name: Jazzmine Kleiman MRN: [id number] DOB: [birth year]   Cancelled Treatment:    Reason Eval/Treat Not Completed: Patient not medically ready; patient on bedrest currently.  Will attempt to see when off bedrest.   Reginia Naas 08/18/2016, 12:33 PM  Magda Kiel, [place] 08/18/2016

## 2016-08-18 NOTE — Evaluation (Signed)
Occupational Therapy Evaluation Patient Details Name: Stephanie [name] MRN: [id number] DOB: Oct 20, 1935 Today's Date: 08/18/2016    History of Present Illness 81 y.o. female with medical history significant of CAD and COPD on home O2 presenting with possible CVA. Pt found to have acute L sided temporal/parietal lobe CVA   Clinical Impression   Pt admitted with above and presents to OT with deficits impacting ability to complete ADLs and functional mobility. Pt presents with Rt visual field deficit, decreased sensation and proprioception in RUE, decreased awareness, and Wernicke's aphasia impacting communication.  Pt overall min guard - min assist with mobility but will benefit from continued OT services acutely to increase independence and prepare for d/c to venue listed below.    Follow Up Recommendations  CIR    Equipment Recommendations  None recommended by OT    Recommendations for Other Services Rehab consult     Precautions / Restrictions Precautions Precautions: Fall Precaution Comments: Rt field cut Restrictions Weight Bearing Restrictions: No      Mobility Bed Mobility               General bed mobility comments: received in recliner   Transfers Overall transfer level: Needs assistance   Transfers: Sit to/from Stand Sit to Stand: Min assist;Min guard                   ADL Overall ADL's : Needs assistance/impaired                 Upper Body Dressing : Minimal assistance   Lower Body Dressing: Minimal assistance   Toilet Transfer: Minimal assistance           Functional mobility during ADLs: Minimal assistance;Min guard General ADL Comments: Min guard - Min assist with mobility.  Pt requires cues for attention to Rt visual field during self-care tasks and mobility due to Rt field cut.  Pt with difficulty with self-feeding due to impaired attention to Rt and sensation in RUE     Vision Vision Assessment?: Yes Eye Alignment:  Within Functional Limits Saccades: Impaired - to be further tested in functional context Visual Fields: Right visual field deficit          Pertinent Vitals/Pain Pain Assessment: No/denies pain     Hand Dominance Right   Extremity/Trunk Assessment Upper Extremity Assessment Upper Extremity Assessment: Generalized weakness   Lower Extremity Assessment Lower Extremity Assessment: Generalized weakness       Communication Communication Communication: No difficulties   Cognition Arousal/Alertness: Awake/alert Behavior During Therapy: WFL for tasks assessed/performed Overall Cognitive Status: Difficult to assess                 General Comments: Wernicke's aphasia              Home Living Family/patient expects to be discharged to:: Private residence Living Arrangements: Alone Available Help at Discharge: Family;Available PRN/intermittently (daughters both work) Type of Home: Apartment             Bathroom Shower/Tub: Teacher, early years/pre: Standard     Home Equipment: Tub bench;Grab bars - tub/shower;Hand held shower head          Prior Functioning/Environment Level of Independence: Independent        Comments: Pt is very active still driving and running all her errands independently        OT Problem List: Decreased strength;Impaired balance (sitting and/or standing);Decreased activity tolerance;Impaired vision/perception;Decreased coordination;Decreased knowledge of precautions;Impaired sensation;Impaired UE  functional use   OT Treatment/Interventions: Self-care/ADL training;DME and/or AE instruction;Therapeutic activities;Cognitive remediation/compensation;Patient/family education;Balance training    OT Goals(Current goals can be found in the care plan section) Acute Rehab OT Goals Patient Stated Goal: none stated OT Goal Formulation: With patient/family Time For Goal Achievement: 09/01/16 Potential to Achieve Goals: Good  OT  Frequency: Min 2X/week   Barriers to D/C: Decreased caregiver support  daughters work          End of Glass blower/designer During Treatment: Teacher, adult education Communication: Mobility status  Activity Tolerance: Patient tolerated treatment well;No increased pain Patient left: in chair;with call bell/phone within reach;with family/visitor present   Time: 4742-5956 OT Time Calculation (min): 19 min Charges:  OT General Charges $OT Visit: 1 Procedure OT Evaluation $OT Eval Moderate Complexity: 1 Procedure G-CodesSimonne Come, [phone] 08/18/2016, 3:35 PM

## 2016-08-19 ENCOUNTER — Ambulatory Visit (HOSPITAL_COMMUNITY): Payer: PPO

## 2016-08-19 ENCOUNTER — Inpatient Hospital Stay (HOSPITAL_COMMUNITY): Payer: PPO

## 2016-08-19 ENCOUNTER — Encounter (HOSPITAL_COMMUNITY)
Admission: EM | Disposition: A | Discharge status: Skilled Nursing Facility | Payer: Self-pay | Source: Home / Self Care | Attending: Family Medicine

## 2016-08-19 ENCOUNTER — Encounter (HOSPITAL_COMMUNITY): Payer: Self-pay

## 2016-08-19 ENCOUNTER — Other Ambulatory Visit (HOSPITAL_COMMUNITY): Payer: PPO

## 2016-08-19 DIAGNOSIS — R4701 Aphasia: Secondary | ICD-10-CM

## 2016-08-19 DIAGNOSIS — I639 Cerebral infarction, unspecified: Secondary | ICD-10-CM

## 2016-08-19 DIAGNOSIS — G459 Transient cerebral ischemic attack, unspecified: Secondary | ICD-10-CM

## 2016-08-19 HISTORY — PX: EP IMPLANTABLE DEVICE: SHX172B

## 2016-08-19 LAB — BASIC METABOLIC PANEL
ANION GAP: 9 (ref 5–15)
BUN: 9 mg/dL (ref 6–20)
CHLORIDE: 106 mmol/L (ref 101–111)
CO2: 26 mmol/L (ref 22–32)
Calcium: 8.7 mg/dL — ABNORMAL LOW (ref 8.9–10.3)
Creatinine, Ser: 0.99 mg/dL (ref 0.44–1.00)
GFR calc Af Amer: >60 mL/min (ref >60)
GFR calc non Af Amer: 53 mL/min — ABNORMAL LOW (ref >60)
GLUCOSE: 103 mg/dL — AB (ref 65–99)
POTASSIUM: 3.6 mmol/L (ref 3.5–5.1)
Sodium: 141 mmol/L (ref 135–145)

## 2016-08-19 LAB — CBC WITH DIFFERENTIAL/PLATELET
BASOS ABS: 0 10*3/uL (ref 0.0–0.1)
Basophils Relative: 0 %
EOS PCT: 2 %
Eosinophils Absolute: 0.1 10*3/uL (ref 0.0–0.7)
HEMATOCRIT: 31.5 % — AB (ref 36.0–46.0)
Hemoglobin: 10.1 g/dL — ABNORMAL LOW (ref 12.0–15.0)
LYMPHS ABS: 1.5 10*3/uL (ref 0.7–4.0)
LYMPHS PCT: 28 %
MCH: 29.6 pg (ref 26.0–34.0)
MCHC: 32.1 g/dL (ref 30.0–36.0)
MCV: 92.4 fL (ref 78.0–100.0)
MONO ABS: 0.5 10*3/uL (ref 0.1–1.0)
MONOS PCT: 10 %
NEUTROS ABS: 3.2 10*3/uL (ref 1.7–7.7)
Neutrophils Relative %: 60 %
PLATELETS: 198 10*3/uL (ref 150–400)
RBC: 3.41 MIL/uL — ABNORMAL LOW (ref 3.87–5.11)
RDW: 14.7 % (ref 11.5–15.5)
WBC: 5.3 10*3/uL (ref 4.0–10.5)

## 2016-08-19 LAB — ECHOCARDIOGRAM COMPLETE
Height: 62 in
WEIGHTICAEL: 2051.2 [oz_av]

## 2016-08-19 SURGERY — INVASIVE LAB ABORTED CASE

## 2016-08-19 SURGERY — LOOP RECORDER INSERTION
Anesthesia: LOCAL

## 2016-08-19 MED ORDER — MIDAZOLAM HCL 10 MG/2ML IJ SOLN
INTRAMUSCULAR | Status: DC | PRN
  Administered 2016-08-19 (×3): 2 mg via INTRAVENOUS

## 2016-08-19 MED ORDER — BUTAMBEN-TETRACAINE-BENZOCAINE 2-2-14 % EX AERO
INHALATION_SPRAY | CUTANEOUS | Status: DC | PRN
  Administered 2016-08-19: 2 via TOPICAL

## 2016-08-19 MED ORDER — FENTANYL CITRATE (PF) 100 MCG/2ML IJ SOLN
INTRAMUSCULAR | Status: AC
  Filled 2016-08-19: qty 2

## 2016-08-19 MED ORDER — FENTANYL CITRATE (PF) 100 MCG/2ML IJ SOLN
INTRAMUSCULAR | Status: DC | PRN
  Administered 2016-08-19 (×3): 25 ug via INTRAVENOUS

## 2016-08-19 MED ORDER — LIDOCAINE-EPINEPHRINE 1 %-1:100000 IJ SOLN
INTRAMUSCULAR | Status: DC | PRN
  Administered 2016-08-19: 20 mL

## 2016-08-19 MED ORDER — MIDAZOLAM HCL 5 MG/ML IJ SOLN
INTRAMUSCULAR | Status: AC
  Filled 2016-08-19: qty 2

## 2016-08-19 MED ORDER — DIPHENHYDRAMINE HCL 50 MG/ML IJ SOLN
INTRAMUSCULAR | Status: DC | PRN
Start: 2016-08-19 — End: 2016-08-19
  Administered 2016-08-19: 25 mg via INTRAVENOUS

## 2016-08-19 MED ORDER — CLOPIDOGREL BISULFATE 75 MG PO TABS
75.0000 mg | ORAL_TABLET | Freq: Every day | ORAL | Status: DC
Start: 2016-08-20 — End: 2016-08-22
  Administered 2016-08-20 – 2016-08-21 (×2): 75 mg via ORAL
  Filled 2016-08-19 (×3): qty 1

## 2016-08-19 MED ORDER — LIDOCAINE-EPINEPHRINE 1 %-1:100000 IJ SOLN
INTRAMUSCULAR | Status: AC
  Filled 2016-08-19: qty 1

## 2016-08-19 SURGICAL SUPPLY — 2 items
LOOP REVEAL LINQSYS (Prosthesis & Implant Heart) ×1 IMPLANT
PACK LOOP INSERTION (CUSTOM PROCEDURE TRAY) ×2 IMPLANT

## 2016-08-19 NOTE — CV Procedure (Signed)
     Transesophageal Echocardiogram Note  Dameisha Tschida [id number] 05-Mar-1936  Procedure: Transesophageal Echocardiogram Indications: Stroke  Procedure Details Consent: Obtained Time Out: Verified patient identification, verified procedure, site/side was marked, verified correct patient position, special equipment/implants available, Radiology Safety Procedures followed,  medications/allergies/relevent history reviewed, required imaging and test results available.  Performed  Medications: Fentanyl: 75 mcg  Versed: 8 mg Benadryl: 25 mg  The patient required high doses versed, fentanyl, despite that I was unable to sedate her, she was combative, pulling the probe out and started to desaturate down to 87%.   We aborted the procedure.  Complications: No apparent complications Patient did not tolerate procedure well.  Ena Dawley, MD, [facility] 08/19/2016, 10:09 AM

## 2016-08-19 NOTE — Progress Notes (Addendum)
Inpatient Rehabilitation  Met with patient's family as patient has been out of the room for procedures today.  Discussed IP Rehab MD's recommendations as well as insurance denial for IP Rehab.  Daughters expressed interest SNF, [facility] where patient has previously been.  Notified RN CM and CSW.  Will sign off at this time.  Please call with questions.  Carmelia Roller., CCC/SLP Admission Coordinator  [facility]  Cell [phone]

## 2016-08-19 NOTE — Progress Notes (Signed)
PT Cancellation Note  Patient Details Name: Renna Kilmer MRN: [id number] DOB: 1936/01/18   Cancelled Treatment:    Reason Eval/Treat Not Completed: Patient at procedure or test/unavailable (Pt remains off unit, check x2 this am for TEE and this pm for loop procedure.  Will f/u today if time permits.  )   Reagyn Facemire Eli Hose 08/19/2016, 3:48 PM Governor Rooks, PTA pager [phone]

## 2016-08-19 NOTE — Consult Note (Signed)
ELECTROPHYSIOLOGY CONSULT NOTE  Patient ID: Stephanie [name] MRN: [id number], DOB/AGE: 1935-12-04   Admit date: 08/17/2016 Date of Consult: 08/19/2016  Primary Physician: Vernie Murders, [place] Primary Cardiologist: none Reason for Consultation: Cryptogenic stroke - recommendations regarding Implantable Loop Recorder  History of Present Illness Stephanie [name] was admitted on 08/17/2016 with R sided weakness and garbled/difficult speech.  They first developed symptoms while at home.  PMHx includes COPD, quit smoking years ago, very remote stent 13 years ago.  Imaging demonstrated Large left posterior MCA infarct, embolic versus large vessel disease.  she has undergone workup for stroke including carotid dopplers, echo is pending, for TEE this morining.  The patient has been monitored on telemetry which has demonstrated sinus rhythm with no arrhythmias.     Lab work is reviewed.  Prior to admission, the patient denies chest pain, shortness of breath, dizziness, palpitations, or syncope.  They are recovering from their stroke with improving right weakness though remains, she c/w expressive/receptive aphasia though able to get tell me some things clearly.  EP has been asked to evaluate for placement of an implantable loop recorder to monitor for atrial fibrillation.     Past Medical History:  Diagnosis Date  . Arthritis   . Complication of anesthesia   . COPD (chronic obstructive pulmonary disease) ([place])   . Coronary artery disease 2013   stent  . Depression   . Edema   . Pneumonia    IN PAST  . PONV (postoperative nausea and vomiting)      Surgical History:  Past Surgical History:  Procedure Laterality Date  . BALLOON ANGIOPLASTY, ARTERY  05/17/2009  . CATARACT EXTRACTION W/PHACO Left 05/24/2016   Procedure: CATARACT EXTRACTION PHACO AND INTRAOCULAR LENS PLACEMENT (IOC);  Surgeon: Birder Robson, MD;  Location: [facility] ORS;  Service: Ophthalmology;  Laterality: Left;   Lot# [id number] H [place]:   00:52.3 AP%:   27.3 CDE:  14.26   . CATARACT EXTRACTION W/PHACO Right 06/14/2016   Procedure: CATARACT EXTRACTION PHACO AND INTRAOCULAR LENS PLACEMENT (IOC);  Surgeon: Birder Robson, MD;  Location: [facility] ORS;  Service: Ophthalmology;  Laterality: Right;  Lot# [id number] H [place]: 01:13.4 AP%: 24.4 CDE: 17.85  . CORONARY ANGIOPLASTY     STENT     Facility-Administered Medications Prior to Admission  Medication Dose Route Frequency Provider Last Rate Last Dose  . ipratropium-albuterol (DUONEB) 0.5-2.5 (3) MG/3ML nebulizer solution 3 mL  3 mL Nebulization Q4H PRN Lytle Butte, MD       Prescriptions Prior to Admission  Medication Sig Dispense Refill Last Dose  . acetaminophen (TYLENOL) 500 MG tablet Take 1 tablet by mouth at bedtime.    unknown at Unknown time  . albuterol (VENTOLIN HFA) 108 (90 Base) MCG/ACT inhaler Inhale 2 puffs into the lungs every 6 (six) hours as needed. 3 Inhaler 3 Past Week at Unknown time  . aspirin 325 MG EC tablet Take 1 tablet by mouth at bedtime.    unknown at Unknown time  . bisacodyl (DULCOLAX) 5 MG EC tablet Take 1 tablet (5 mg total) by mouth daily as needed for moderate constipation. 30 tablet 0 unknown at unknown  . budesonide-formoterol (SYMBICORT) 160-4.5 MCG/ACT inhaler Inhale 2 puffs into the lungs 2 (two) times daily. 3 Inhaler 3 unknown at Unknown time  . Calcium Carb-Cholecalciferol (CALCIUM 600+D) 600-800 MG-UNIT TABS Take 2 tablets by mouth daily.   unknown at Unknown time  . clonazePAM (KLONOPIN) 0.5 MG tablet Take 1 tablet (0.5 mg total) by mouth  2 (two) times daily. 30 tablet 0 unknown at Unknown time  . docusate sodium (COLACE) 100 MG capsule Take 200 mg by mouth at bedtime.   unknown at Unknown time  . ibuprofen (ADVIL,MOTRIN) 200 MG tablet Take 400 mg by mouth daily as needed for headache or moderate pain.    unknown at Unknown time  . PARoxetine (PAXIL) 10 MG tablet TAKE TWO TABLETS BY MOUTH AT BEDTIME DAILY (Patient taking  differently: take 10 mgs at night daily) 60 tablet 2 unknown at Unknown time  . pravastatin (PRAVACHOL) 20 MG tablet Take 10 mg by mouth daily.    unknown at Unknown time  . senna-docusate (SENOKOT-S) 8.6-50 MG tablet Take 1 tablet by mouth at bedtime as needed for mild constipation. 60 tablet 0 unknown at Unknown time  . tiotropium (SPIRIVA HANDIHALER) 18 MCG inhalation capsule Place 1 capsule (18 mcg total) into inhaler and inhale daily. 90 capsule 3 unknown at Unknown time    Inpatient Medications:  . [MAR Hold] aspirin  325 mg Oral QHS  . [MAR Hold] atorvastatin  40 mg Oral q1800  . [MAR Hold] clonazePAM  0.5 mg Oral BID  . [MAR Hold] enoxaparin (LOVENOX) injection  40 mg Subcutaneous Q24H  . [MAR Hold] feeding supplement (ENSURE ENLIVE)  237 mL Oral BID BM  . [MAR Hold] LORazepam  1 mg Intravenous Once  . [MAR Hold] mometasone-formoterol  2 puff Inhalation BID  . [MAR Hold] multivitamin with minerals  1 tablet Oral Daily  . [MAR Hold] PARoxetine  10 mg Oral QHS    Allergies:  Allergies  Allergen Reactions  . Duloxetine Hcl     unknown  . Influenza Vaccines     unknown  . Other     STEROIDS  DON'T WORK FOR HER  . Venlafaxine     GI distress    Social History   Social History  . Marital status: Widowed    Spouse name: N/A  . Number of children: N/A  . Years of education: N/A   Occupational History  . retired    Social History Main Topics  . Smoking status: Former Research scientist (life sciences)  . Smokeless tobacco: Never Used     Comment: QUIT IN 2002  . Alcohol use No  . Drug use: No  . Sexual activity: Not on file   Other Topics Concern  . Not on file   Social History Narrative  . No narrative on file     Family History  Problem Relation Age of Onset  . Hypertension Mother   . Diabetes Mother   . Pancreatic cancer Mother 87  . Suicidality Father 49      Review of Systems: difficult given aphasia, shakes head "no" to all questions  Physical Exam: Vitals:   08/19/16  0503 08/19/16 0757 08/19/16 0918 08/19/16 0920  BP: 108/87 (!) 150/77 (!) 178/75 (!) 172/84  Pulse: 83 80 82   Resp: '20 20 17   '$ Temp: 98.7 F (37.1 C) 98.3 F (36.8 C)    TempSrc: Oral Oral    SpO2: 99% 98% 98%   Weight:      Height:        GEN- The patient is well appearing, alert and oriented is difficult with some aphasia, though does not appear disorients.   Head- normocephalic, atraumatic Eyes-  Sclera clear, conjunctiva pink Ears- hearing intact Oropharynx- clear Neck- supple Lungs- Clear to ausculation bilaterally, normal work of breathing Heart- RRR, no murmurs, rubs or gallops  GI-  soft, NT, ND Extremities- no clubbing, cyanosis, or edema MS- no significant deformity or atrophy Skin- no rash or lesion Psych- difficult to assess with element of aphasia.    Labs:   Lab Results  Component Value Date   WBC 5.3 08/19/2016   HGB 10.1 (L) 08/19/2016   HCT 31.5 (L) 08/19/2016   MCV 92.4 08/19/2016   PLT 198 08/19/2016    Recent Labs Lab 08/17/16 1752 08/19/16 0339  NA 138 141  K 4.0 3.6  CL 104 106  CO2 23 26  BUN 13 9  CREATININE 1.04* 0.99  CALCIUM 10.0 8.7*  PROT 6.0*  --   BILITOT 0.3  --   ALKPHOS 59  --   ALT 19  --   AST 20  --   GLUCOSE 93 103*   Lab Results  Component Value Date   TROPONINI <0.03 08/18/2016   Lab Results  Component Value Date   CHOL 222 (H) 08/18/2016   CHOL 203 (H) 07/06/2016   CHOL 236 (H) 08/20/2015   Lab Results  Component Value Date   HDL 103 08/18/2016   HDL 112 07/06/2016   HDL 118 08/20/2015   Lab Results  Component Value Date   LDLCALC 99 08/18/2016   LDLCALC 77 07/06/2016   LDLCALC 100 (H) 08/20/2015   Lab Results  Component Value Date   TRIG 100 08/18/2016   TRIG 72 07/06/2016   TRIG 89 08/20/2015   Lab Results  Component Value Date   CHOLHDL 2.2 08/18/2016   CHOLHDL 1.8 07/06/2016   CHOLHDL 2.0 08/20/2015   No results found for: LDLDIRECT  No results found for: DDIMER     Radiology/Studies:  Dg Chest 1 View Result Date: 07/21/2016 CLINICAL DATA:  Respiratory failure EXAM: CHEST 1 VIEW COMPARISON:  07/19/2016 FINDINGS: Cardiomediastinal silhouette is stable. Atherosclerotic calcifications of thoracic aorta. Stable left basilar scarring. No infiltrate or pulmonary edema. IMPRESSION: No active disease. Electronically Signed   By: Lahoma Crocker M.D.   On: 07/21/2016 09:18   Ct Head Wo Contrast Result Date: 08/17/2016 CLINICAL DATA:  Aphasia, right-sided weakness. EXAM: CT HEAD WITHOUT CONTRAST TECHNIQUE: Contiguous axial images were obtained from the base of the skull through the vertex without intravenous contrast. COMPARISON:  None. FINDINGS: Brain: Low density and sulcal effacement is seen involving the left temporal and parietal cortex consistent with acute infarction. No hemorrhage is noted. Probable old left cerebellar infarction is noted. No midline shift is noted. Vascular: Atherosclerosis of carotid siphons is noted. Skull: Normal. Negative for fracture or focal lesion. Sinuses/Orbits: Sphenoid sinusitis is noted. Other: None. 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 Sphenoid sinusitis. Electronically Signed   By: Marijo Conception, M.D.   On: 08/17/2016 16:41   Mr Brain Wo Contrast Result Date: 08/18/2016 CLINICAL DATA:  Possible CVA. Right facial droop. Right arm and leg weakness. EXAM: MRI HEAD WITHOUT CONTRAST MRA HEAD WITHOUT CONTRAST TECHNIQUE: Multiplanar, multiecho pulse sequences of the brain and surrounding structures were obtained without intravenous contrast. Angiographic images of the head were obtained using MRA technique without contrast. COMPARISON:  Head CT 08/17/2016 FINDINGS: MRI HEAD FINDINGS Brain: There is diffusion restriction within the posterior left MCA distribution, involving the posterior left frontal lobe, the left insular cortex and the left parietal and temporal lobes. There is no associated hemorrhage.  There is hyperintense T2 weighted signal within the corresponding distribution of the ischemia. There is otherwise mild periventricular T2 hyperintensity, but no other focal parenchymal  signal abnormality. No mass lesion or midline shift. No hydrocephalus or extra-axial fluid collection. The midline structures are normal. No age advanced or lobar predominant atrophy. Vascular: Major intracranial arterial and venous sinus flow voids are preserved. No evidence of chronic microhemorrhage or amyloid angiopathy. Skull and upper cervical spine: The visualized skull base, calvarium, upper cervical spine and extracranial soft tissues are normal. Sinuses/Orbits: There is fluid within the left sphenoid sinus. Normal orbits. MRA HEAD FINDINGS Intracranial internal carotid arteries: Normal. Anterior cerebral arteries: Normal. Middle cerebral arteries: There is multifocal moderate narrowing of both middle cerebral artery M2 segments. Posterior communicating arteries: Present bilaterally. Posterior cerebral arteries: Bilateral fetal origins, but otherwise normal. Basilar artery: Normal. Vertebral arteries: Left dominant. The right vertebral artery is very diminutive and may terminate in the posterior inferior cerebellar artery. Superior cerebellar arteries: Normal. Anterior inferior cerebellar arteries: Not visualized, which is not uncommon. Posterior inferior cerebellar arteries: Normal. IMPRESSION: 1. Acute infarct within the posterior left MCA distribution. No hemorrhage or mass effect. 2. Multifocal moderate stenosis within the bilateral MCA M2 distributions. No proximal intracranial arterial occlusion. Electronically Signed   By: Ulyses Jarred M.D.   On: 08/18/2016 06:05     12-lead ECG SR All prior EKG's in EPIC reviewed with no documented atrial fibrillation  Telemetry SR only  Assessment and Plan:  1. Cryptogenic stroke The patient presents with cryptogenic stroke.  The patient has a TEE planned for this AM.   I spoke at length with the patient and her 2 daughters at bedside about monitoring for afib with either a 30 day event monitor or an implantable loop recorder.  Risks, benefits, and alteratives to implantable loop recorder were discussed with the patient today.   Discussed ability to be able to communicate via telephone, she has excellent support from her daughters who they state would be at least for now the preferred contact.  At this time, the patient and her daughters are very clear in their decision to proceed with implantable loop recorder.   Wound care was reviewed with the patient (keep incision clean and dry for 3 days).  Wound check scheduled for 09/01/16  Please call with questions.   Baldwin [place], PA-C 08/19/2016   I have seen and examined this patient with Tommye Standard.  Agree with above, note added to reflect my findings.  On exam, regular rhythm, no murmurs, lungs clear.  Presented to the hospital with a large left MCA stroke.  TEE unable to be performed due to patient cooperation. Plan for linq placement to evaluate for atrial fibrillation.  Risks and benefits discussed. Risks include bleeding and infection. The patient understands the risks and has agreed to the procedure.  Cheryn Lundquist M. Degan Hanser MD 08/19/2016 2:33 PM

## 2016-08-19 NOTE — Progress Notes (Signed)
CSW started insurance auth for [employer].  Percell Locus Parker Sawatzky LCSWA [phone]

## 2016-08-19 NOTE — H&P (View-Only) (Signed)
PROGRESS NOTE [facility]   Xanthe Couillard   GQQ:[id number] DOB: Jun 14, 1936  DOA: 08/17/2016 PCP: Vernie Murders, PA   Brief Narrative:  81 year old female with medical history significant for CAD and COPD not on home oxygen presented to the ED with aphasia and left-sided weakness, CT head showed acute infarct in the left temporal and parietal cortex. Patient admitted for further evaluation, neurology was consulted TPA was not given due to delay in arrival.  Subjective: Patient seen and examined daughters at bedside, she complains of visual field cut and continues to have difficulty speaking. 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 Expressive aphasia and loss of right visual field MRI/MRA - shows MCA infarct/moderate stenosis versus clot left M2 Carotid Doppler no significant stenosis Echo pending TEE in the morning - if negative we'll consult electrophysiologist for implantable loop recorder Aspirin 325 daily PT recommending CIR Neurology recommendations appreciated LDL mild elevated will increase statin prior to d/c   Hypertension Allow permissive hypertension Monitor BP Treat BP only if > 220/120 Patient not taking chronic BP medications  Elevated troponin Likely secondary to demand ischemia, no EKG changes, one is now back to normal  COPD - asymptomatic at this time Continue home medications - DuoNeb and Dulera  DVT prophylaxis: Lovenox Code Status: Full Family Communication: Family at bedside Disposition Plan: SNF versus CIR   Consultants:   Neurology   Procedures:   ECHO - Pending   Antimicrobials:  None   Objective: Vitals:   08/18/16 0600 08/18/16 0957 08/18/16 1309 08/18/16 1658  BP: 132/72 (!) 129/53 (!) 102/45 (!) 116/54  Pulse: 72 94 86 84  Resp: '18 20 20 20  '$ Temp: 98.9 F (37.2 C) 97.5 F (36.4 C) 98.1 F (36.7 C) 98 F (36.7 C)    TempSrc: Oral Oral Oral Oral  SpO2: 99% 97% 94% 95%  Weight:      Height:        Intake/Output Summary (Last 24 hours) at 08/18/16 1733 Last data filed at 08/18/16 0300  Gross per 24 hour  Intake           376.67 ml  Output              200 ml  Net           176.67 ml   Filed Weights   08/17/16 1542 08/17/16 2140  Weight: 63.5 kg (140 lb) 58.2 kg (128 lb 3.2 oz)    Examination:  General exam: Appears calm and comfortable  HEENT: Right hemianopsia Respiratory system: Clear to auscultation. No wheezes,crackle or rhonchi Cardiovascular system: S1 & S2 heard, RRR. No JVD, murmurs, rubs or gallops Gastrointestinal system: Abdomen is nondistended, soft and nontender. No organomegaly or masses felt. Normal bowel sounds heard. Central nervous system: Alert and oriented. Expressive aphasia, mild right site weakness upper > lower, good ROM Extremities: No pedal edema. Symmetric, strength 4/5 L > R   Skin: No rashes, lesions or ulcers Psychiatry: Judgement and insight appear normal. Mood & affect appropriate.    Data Reviewed: I have personally reviewed following labs and imaging studies  CBC:  Recent Labs Lab 08/17/16 1752  WBC 7.3  NEUTROABS 5.8  HGB 11.6*  HCT 34.9*  MCV 91.4  PLT 671   Basic Metabolic Panel:  Recent Labs Lab 08/17/16 1752  NA 138  K 4.0  CL 104  CO2 23  GLUCOSE 93  BUN 13  CREATININE 1.04*  CALCIUM 10.0   GFR: Estimated Creatinine Clearance: 35.3 mL/min (by C-G formula based on SCr of 1.04 mg/dL (H)). Liver Function Tests:  Recent Labs Lab 08/17/16 1752  AST 20  ALT 19  ALKPHOS 59  BILITOT 0.3  PROT 6.0*  ALBUMIN 3.4*   No results for input(s): LIPASE, AMYLASE in the last 168 hours. No results for input(s): AMMONIA in the last 168 hours. Coagulation Profile:  Recent Labs Lab 08/17/16 1752  INR 0.92   Cardiac Enzymes:  Recent Labs Lab 08/17/16 2210 08/18/16 0333 08/18/16 0945  TROPONINI 0.04* 0.06* <0.03   BNP (last  3 results) No results for input(s): PROBNP in the last 8760 hours. HbA1C: No results for input(s): HGBA1C in the last 72 hours. CBG: No results for input(s): GLUCAP in the last 168 hours. Lipid Profile:  Recent Labs  08/18/16 0333  CHOL 222*  HDL 103  LDLCALC 99  TRIG 100  CHOLHDL 2.2   Thyroid Function Tests:  Recent Labs  08/18/16 0333  TSH 1.619   Anemia Panel: No results for input(s): VITAMINB12, FOLATE, FERRITIN, TIBC, IRON, RETICCTPCT in the last 72 hours. Sepsis Labs: No results for input(s): PROCALCITON, LATICACIDVEN in the last 168 hours.  No results found for this or any previous visit (from the past 240 hour(s)).    Radiology Studies: Ct Head Wo Contrast  Result Date: 08/17/2016 CLINICAL DATA:  Aphasia, right-sided weakness. EXAM: CT HEAD WITHOUT CONTRAST TECHNIQUE: Contiguous axial images were obtained from the base of the skull through the vertex without intravenous contrast. COMPARISON:  None. FINDINGS: Brain: Low density and sulcal effacement is seen involving the left temporal and parietal cortex consistent with acute infarction. No hemorrhage is noted. Probable old left cerebellar infarction is noted. No midline shift is noted. Vascular: Atherosclerosis of carotid siphons is noted. Skull: Normal. Negative for fracture or focal lesion. Sinuses/Orbits: Sphenoid sinusitis is noted. Other: None. 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 Sphenoid sinusitis. Electronically Signed   By: Marijo Conception, M.D.   On: 08/17/2016 16:41   Mr Brain Wo Contrast  Result Date: 08/18/2016 CLINICAL DATA:  Possible CVA. Right facial droop. Right arm and leg weakness. EXAM: MRI HEAD WITHOUT CONTRAST MRA HEAD WITHOUT CONTRAST TECHNIQUE: Multiplanar, multiecho pulse sequences of the brain and surrounding structures were obtained without intravenous contrast. Angiographic images of the head were obtained using MRA technique without contrast.  COMPARISON:  Head CT 08/17/2016 FINDINGS: MRI HEAD FINDINGS Brain: There is diffusion restriction within the posterior left MCA distribution, involving the posterior left frontal lobe, the left insular cortex and the left parietal and temporal lobes. There is no associated hemorrhage. There is hyperintense T2 weighted signal within the corresponding distribution of the ischemia. There is otherwise mild periventricular T2 hyperintensity, but no other focal parenchymal signal abnormality. No mass lesion or midline shift. No hydrocephalus or extra-axial fluid collection. The midline structures are normal. No age advanced or lobar predominant atrophy. Vascular: Major intracranial arterial and venous sinus flow voids are preserved. No evidence of chronic microhemorrhage or amyloid angiopathy. Skull and upper cervical spine: The visualized skull base, calvarium, upper cervical spine and extracranial soft tissues are normal. Sinuses/Orbits: There is fluid within the left sphenoid sinus. Normal orbits. MRA HEAD FINDINGS Intracranial internal carotid arteries: Normal. Anterior cerebral arteries: Normal. Middle cerebral arteries: There is multifocal moderate narrowing of both middle cerebral artery M2 segments. Posterior communicating arteries: Present bilaterally. Posterior cerebral arteries: Bilateral fetal origins, but otherwise  normal. Basilar artery: Normal. Vertebral arteries: Left dominant. The right vertebral artery is very diminutive and may terminate in the posterior inferior cerebellar artery. Superior cerebellar arteries: Normal. Anterior inferior cerebellar arteries: Not visualized, which is not uncommon. Posterior inferior cerebellar arteries: Normal. IMPRESSION: 1. Acute infarct within the posterior left MCA distribution. No hemorrhage or mass effect. 2. Multifocal moderate stenosis within the bilateral MCA M2 distributions. No proximal intracranial arterial occlusion. Electronically Signed   By: Ulyses Jarred  M.D.   On: 08/18/2016 06:05   Mr Jodene Nam Head/brain WC Cm  Result Date: 08/18/2016 CLINICAL DATA:  Possible CVA. Right facial droop. Right arm and leg weakness. EXAM: MRI HEAD WITHOUT CONTRAST MRA HEAD WITHOUT CONTRAST TECHNIQUE: Multiplanar, multiecho pulse sequences of the brain and surrounding structures were obtained without intravenous contrast. Angiographic images of the head were obtained using MRA technique without contrast. COMPARISON:  Head CT 08/17/2016 FINDINGS: MRI HEAD FINDINGS Brain: There is diffusion restriction within the posterior left MCA distribution, involving the posterior left frontal lobe, the left insular cortex and the left parietal and temporal lobes. There is no associated hemorrhage. There is hyperintense T2 weighted signal within the corresponding distribution of the ischemia. There is otherwise mild periventricular T2 hyperintensity, but no other focal parenchymal signal abnormality. No mass lesion or midline shift. No hydrocephalus or extra-axial fluid collection. The midline structures are normal. No age advanced or lobar predominant atrophy. Vascular: Major intracranial arterial and venous sinus flow voids are preserved. No evidence of chronic microhemorrhage or amyloid angiopathy. Skull and upper cervical spine: The visualized skull base, calvarium, upper cervical spine and extracranial soft tissues are normal. Sinuses/Orbits: There is fluid within the left sphenoid sinus. Normal orbits. MRA HEAD FINDINGS Intracranial internal carotid arteries: Normal. Anterior cerebral arteries: Normal. Middle cerebral arteries: There is multifocal moderate narrowing of both middle cerebral artery M2 segments. Posterior communicating arteries: Present bilaterally. Posterior cerebral arteries: Bilateral fetal origins, but otherwise normal. Basilar artery: Normal. Vertebral arteries: Left dominant. The right vertebral artery is very diminutive and may terminate in the posterior inferior cerebellar  artery. Superior cerebellar arteries: Normal. Anterior inferior cerebellar arteries: Not visualized, which is not uncommon. Posterior inferior cerebellar arteries: Normal. IMPRESSION: 1. Acute infarct within the posterior left MCA distribution. No hemorrhage or mass effect. 2. Multifocal moderate stenosis within the bilateral MCA M2 distributions. No proximal intracranial arterial occlusion. Electronically Signed   By: Ulyses Jarred M.D.   On: 08/18/2016 06:05    Scheduled Meds: . aspirin  325 mg Oral QHS  . clonazePAM  0.5 mg Oral BID  . enoxaparin (LOVENOX) injection  40 mg Subcutaneous Q24H  . [START ON 08/19/2016] feeding supplement (ENSURE ENLIVE)  237 mL Oral BID BM  . ipratropium-albuterol  3 mL Nebulization TID  . LORazepam  1 mg Intravenous Once  . mometasone-formoterol  2 puff Inhalation BID  . multivitamin with minerals  1 tablet Oral Daily  . PARoxetine  10 mg Oral QHS  . pravastatin  10 mg Oral q1800   Continuous Infusions: . sodium chloride 50 mL/hr at 08/18/16 0016     LOS: 1 day    Chipper Oman, MD [facility] Pager [phone]  If 7PM-7AM, please contact night-coverage [email or web address] Password [facility] 08/18/2016, 5:33 PM

## 2016-08-19 NOTE — Progress Notes (Signed)
  Echocardiogram 2D Echocardiogram has been performed.  Stephanie [name] 08/19/2016, 11:34 AM

## 2016-08-19 NOTE — NC FL2 (Signed)
[place] LEVEL OF CARE SCREENING TOOL     IDENTIFICATION  Patient Name: Stephanie [name] Birthdate: 1935-12-08 Sex: female Admission Date (Current Location): 08/17/2016  [facility] and [place] Number:  Herbalist and Address:  [facility], [place] [address] [place],  [address]      Provider Number: [id number]  Attending Physician Name and Address:  Doreatha Lew, MD  Relative Name and Phone Number:       Current Level of Care: Hospital Recommended Level of Care: [place] Prior Approval Number:    Date Approved/Denied:   PASRR Number: [id number]  Discharge Plan: SNF    Current Diagnoses: Patient Active Problem List   Diagnosis Date Noted  . Elevated troponin 08/18/2016  . Mild malnutrition ([place]) 08/18/2016  . Acute CVA (cerebrovascular accident) ([place]) 08/17/2016  . Acute exacerbation of chronic obstructive pulmonary disease (COPD) ([place]) 07/19/2016  . Depression 05/03/2016  . Mild dementia 04/19/2016  . COPD, moderate ([place]) 03/05/2015  . Anxiety and depression 03/04/2015  . Arteriosclerosis of coronary artery 03/04/2015  . CAFL (chronic airflow limitation) ([place]) 03/04/2015  . Breathlessness on exertion 03/04/2015  . HLD (hyperlipidemia) 03/04/2015  . Osteopenia 03/04/2015  . Peptic ulcer 03/04/2015  . Amnesia 03/20/2014  . Disordered sleep 03/20/2014  . Allergy to environmental factors 11/14/2013  . Gastroduodenal ulcer 11/14/2013  . GI bleed 11/14/2013    Orientation RESPIRATION BLADDER Height & Weight     Self  Normal Continent Weight: 58.2 kg (128 lb 3.2 oz) Height:  '5\' 2"'$  (157.5 cm)  BEHAVIORAL SYMPTOMS/MOOD NEUROLOGICAL BOWEL NUTRITION STATUS      Continent Diet (Please see DC Summary)  AMBULATORY STATUS COMMUNICATION OF NEEDS Skin   Limited Assist Verbally Normal                       Personal Care Assistance Level of Assistance  Bathing, Feeding, Dressing Bathing  Assistance: Limited assistance Feeding assistance: Limited assistance Dressing Assistance: Limited assistance     Functional Limitations Info             SPECIAL CARE FACTORS FREQUENCY  PT (By licensed PT)     PT Frequency: 5x/week              Contractures      Additional Factors Info  Code Status, Allergies, Psychotropic Code Status Info: DNR Allergies Info: Duloxetine Hcl, Influenza Vaccines, Other, Venlafaxine Psychotropic Info: Klonopin; Paxil;Ativan         Current Medications (08/19/2016):  This is the current hospital active medication list Current Facility-Administered Medications  Medication Dose Route Frequency Provider Last Rate Last Dose  . 0.9 %  sodium chloride infusion   Intravenous Continuous Karmen Bongo, MD 50 mL/hr at 08/18/16 0016    . [MAR Hold] acetaminophen (TYLENOL) tablet 650 mg  650 mg Oral Q4H PRN Karmen Bongo, MD   650 mg at 08/18/16 1621   Or  . Doug Sou Hold] acetaminophen (TYLENOL) solution 650 mg  650 mg Per Tube Q4H PRN Karmen Bongo, MD       Or  . Doug Sou Hold] acetaminophen (TYLENOL) suppository 650 mg  650 mg Rectal Q4H PRN Karmen Bongo, MD      . Doug Sou Hold] albuterol (PROVENTIL) (2.5 MG/3ML) 0.083% nebulizer solution 2.5 mg  2.5 mg Nebulization Q2H PRN Karmen Bongo, MD      . Doug Sou Hold] aspirin EC tablet 325 mg  325 mg Oral QHS Karmen Bongo, MD  325 mg at 08/18/16 2201  . [MAR Hold] atorvastatin (LIPITOR) tablet 40 mg  40 mg Oral q1800 Doreatha Lew, MD      . Doug Sou Hold] bisacodyl (DULCOLAX) EC tablet 5 mg  5 mg Oral Daily PRN Karmen Bongo, MD      . Doug Sou Hold] clonazePAM Bobbye [place]) tablet 0.5 mg  0.5 mg Oral BID Karmen Bongo, MD   0.5 mg at 08/18/16 2202  . [MAR Hold] enoxaparin (LOVENOX) injection 40 mg  40 mg Subcutaneous Q24H Karmen Bongo, MD   40 mg at 08/18/16 2202  . [MAR Hold] feeding supplement (ENSURE ENLIVE) (ENSURE ENLIVE) liquid 237 mL  237 mL Oral BID BM Doreatha Lew, MD   237 mL at 08/19/16 1413   . [MAR Hold] LORazepam (ATIVAN) injection 1 mg  1 mg Intravenous Once Jeryl Columbia, NP      . Doug Sou Hold] mometasone-formoterol (DULERA) 200-5 MCG/ACT inhaler 2 puff  2 puff Inhalation BID Karmen Bongo, MD   2 puff at 08/18/16 2043  . [MAR Hold] multivitamin with minerals tablet 1 tablet  1 tablet Oral Daily Doreatha Lew, MD   1 tablet at 08/18/16 1612  . [MAR Hold] PARoxetine (PAXIL) tablet 10 mg  10 mg Oral QHS Karmen Bongo, MD   10 mg at 08/18/16 2201  . [MAR Hold] senna-docusate (Senokot-S) tablet 1 tablet  1 tablet Oral QHS PRN Karmen Bongo, MD         Discharge Medications: Please see discharge summary for a list of discharge medications.  Relevant Imaging Results:  Relevant Lab Results:   Additional Information SS# [id number]  Benard Halsted, LCSWA

## 2016-08-19 NOTE — Progress Notes (Signed)
PROGRESS NOTE [facility]   Jazmine Heckman   AOZ:[id number] DOB: [birth year]  DOA: 08/17/2016 PCP: Vernie Murders, PA   Brief Narrative:  81 year old female with medical history significant for CAD and COPD not on home oxygen presented to the ED with aphasia and left-sided weakness, CT head showed acute infarct in the left temporal and parietal cortex. Patient admitted for further evaluation, neurology was consulted TPA was not given due to delay in arrival.  Subjective: Patient went for TEE today, but unable to perform given agitation in during anesthesia, was given more sedative but started to desat. Procedure was cancelled. Patient doing well this morning no new issues.   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   Hypertension - satble  Allow permissive hypertension Monitor BP Treat BP only if > 220/120 Patient not taking chronic BP medications  Elevated troponin Likely secondary to demand ischemia, no EKG changes,  now back to normal  COPD - asymptomatic at this time Continue home medications - DuoNeb and Dulera  DVT prophylaxis: Lovenox Code Status: Full Family Communication: Family at bedside Disposition Plan: SNF - awaiting approval    Consultants:   Neurology   Procedures:   ECHO - Pending   Antimicrobials:  None   Objective: Vitals:   08/19/16 1010 08/19/16 1015 08/19/16 1025 08/19/16 1748  BP: (!) 146/62 (!) 149/66 130/64 133/66  Pulse: 80 78 75 92  Resp: '12 14 12 17  '$ Temp:   97.8 F (36.6 C)  97.7 F (36.5 C)  TempSrc:  Oral  Oral  SpO2: 92% 96% 97% 98%  Weight:      Height:        Intake/Output Summary (Last 24 hours) at 08/19/16 1804 Last data filed at 08/18/16 2141  Gross per 24 hour  Intake              240 ml  Output                0 ml  Net              240 ml   Filed Weights   08/17/16 1542 08/17/16 2140  Weight: 63.5 kg (140 lb) 58.2 kg (128 lb 3.2 oz)    Examination: No changes in physical exam from 08/18/16  General exam: Appears calm and comfortable  HEENT: Right hemianopsia Respiratory system: Clear to auscultation. No wheezes,crackle or rhonchi Cardiovascular system: S1 & S2 heard, RRR. No JVD, murmurs, rubs or gallops Gastrointestinal system: Abdomen is nondistended, soft and nontender. No organomegaly or masses felt. Normal bowel sounds heard. Central nervous system: Alert and oriented. Expressive aphasia, mild right site weakness upper > lower, good ROM Extremities: No pedal edema. Symmetric, strength 4/5 L > R   Skin: No rashes, lesions or ulcers  Data Reviewed: I have personally reviewed following labs and imaging studies  CBC:  Recent Labs Lab 08/17/16 1752 08/19/16 0339  WBC 7.3 5.3  NEUTROABS 5.8 3.2  HGB 11.6* 10.1*  HCT 34.9* 31.5*  MCV 91.4 92.4  PLT 190 962   Basic Metabolic Panel:  Recent Labs Lab 08/17/16 1752 08/19/16 0339  NA 138 141  K 4.0 3.6  CL 104 106  CO2 23 26  GLUCOSE 93 103*  BUN 13 9  CREATININE 1.04* 0.99  CALCIUM 10.0 8.7*   GFR: Estimated Creatinine Clearance: 37 mL/min (by C-G formula based on SCr of 0.99 mg/dL). Liver Function Tests:  Recent Labs Lab 08/17/16 1752  AST 20  ALT 19  ALKPHOS 59  BILITOT 0.3  PROT 6.0*  ALBUMIN 3.4*   No results for input(s): LIPASE, AMYLASE in the last 168 hours. No results for input(s): AMMONIA in the last 168 hours. Coagulation Profile:  Recent Labs Lab 08/17/16 1752  INR 0.92   Cardiac Enzymes:  Recent Labs Lab  08/17/16 2210 08/18/16 0333 08/18/16 0945  TROPONINI 0.04* 0.06* <0.03   BNP (last 3 results) No results for input(s): PROBNP in the last 8760 hours. HbA1C: No results for input(s): HGBA1C in the last 72 hours. CBG: No results for input(s): GLUCAP in the last 168 hours. Lipid Profile:  Recent Labs  08/18/16 0333  CHOL 222*  HDL 103  LDLCALC 99  TRIG 100  CHOLHDL 2.2   Thyroid Function Tests:  Recent Labs  08/18/16 0333  TSH 1.619   Anemia Panel: No results for input(s): VITAMINB12, FOLATE, FERRITIN, TIBC, IRON, RETICCTPCT in the last 72 hours. Sepsis Labs: No results for input(s): PROCALCITON, LATICACIDVEN in the last 168 hours.  No results found for this or any previous visit (from the past 240 hour(s)).    Radiology Studies: Mr Brain Wo Contrast  Result Date: 08/18/2016 CLINICAL DATA:  Possible CVA. Right facial droop. Right arm and leg weakness. EXAM: MRI HEAD WITHOUT CONTRAST MRA HEAD WITHOUT CONTRAST TECHNIQUE: Multiplanar, multiecho pulse sequences of the brain and surrounding structures were obtained without intravenous contrast. Angiographic images of the head were obtained using MRA technique without contrast. COMPARISON:  Head CT 08/17/2016 FINDINGS: MRI HEAD FINDINGS Brain: There is diffusion restriction within the posterior left MCA distribution, involving the posterior left frontal lobe, the left insular cortex and the left parietal and temporal lobes. There is no associated hemorrhage. There is hyperintense T2 weighted signal within the corresponding distribution of the ischemia. There is otherwise mild periventricular T2 hyperintensity, but no other focal parenchymal signal abnormality. No mass lesion or midline shift. No hydrocephalus or extra-axial fluid collection. The midline structures are normal. No age advanced or lobar predominant atrophy. Vascular: Major intracranial arterial and venous sinus flow voids are preserved. No evidence of chronic  microhemorrhage or amyloid angiopathy. Skull and upper cervical spine: The visualized skull base, calvarium, upper cervical spine and extracranial soft tissues are normal. Sinuses/Orbits: There is fluid within the left sphenoid sinus. Normal orbits. MRA HEAD FINDINGS Intracranial internal carotid arteries: Normal. Anterior cerebral arteries: Normal. Middle cerebral arteries: There is multifocal moderate narrowing of both middle cerebral artery M2 segments. Posterior communicating arteries: Present bilaterally. Posterior cerebral arteries: Bilateral fetal origins, but otherwise normal. Basilar artery: Normal. Vertebral arteries: Left dominant. The right vertebral artery is very diminutive and may terminate in the posterior inferior cerebellar artery. Superior cerebellar arteries: Normal. Anterior inferior cerebellar arteries: Not visualized, which is not uncommon. Posterior inferior cerebellar arteries: Normal. IMPRESSION: 1. Acute infarct within the posterior left MCA distribution. No hemorrhage or mass effect. 2. Multifocal moderate stenosis within the bilateral MCA M2 distributions. No proximal intracranial arterial occlusion. Electronically Signed   By: Ulyses Jarred M.D.   On: 08/18/2016 06:05   Mr Jodene Nam Head/brain Wo Cm  Result Date: 08/18/2016 CLINICAL DATA:  Possible CVA. Right facial droop. Right arm and leg weakness. EXAM: MRI HEAD WITHOUT CONTRAST MRA HEAD WITHOUT CONTRAST TECHNIQUE: Multiplanar, multiecho pulse sequences of the brain and surrounding structures were obtained without intravenous contrast. Angiographic images of the head were obtained using MRA technique without contrast. COMPARISON:  Head CT 08/17/2016 FINDINGS: MRI HEAD FINDINGS Brain: There is diffusion restriction within the posterior left MCA distribution, involving the posterior left frontal lobe, the left insular cortex and the left parietal and temporal lobes. There is no associated hemorrhage. There is hyperintense T2 weighted  signal within the corresponding distribution of the ischemia. There is otherwise mild periventricular T2 hyperintensity, but no other focal parenchymal signal abnormality. No mass lesion or midline shift. No hydrocephalus or extra-axial fluid collection. The midline structures are normal. No age advanced or lobar predominant atrophy. Vascular: Major intracranial arterial and venous sinus flow voids are preserved. No evidence of chronic microhemorrhage or amyloid angiopathy. Skull and upper cervical spine: The visualized skull base, calvarium, upper cervical spine and extracranial soft tissues are normal. Sinuses/Orbits: There is fluid within the left sphenoid sinus. Normal orbits. MRA HEAD FINDINGS Intracranial internal carotid arteries: Normal. Anterior cerebral arteries: Normal. Middle cerebral arteries: There is multifocal moderate narrowing of both middle cerebral artery M2 segments. Posterior communicating arteries: Present bilaterally. Posterior cerebral arteries: Bilateral fetal origins, but otherwise normal. Basilar artery: Normal. Vertebral arteries: Left dominant. The right vertebral artery is very diminutive and may terminate in the posterior inferior cerebellar artery. Superior cerebellar arteries: Normal. Anterior inferior cerebellar arteries: Not visualized, which is not uncommon. Posterior inferior cerebellar arteries: Normal. IMPRESSION: 1. Acute infarct within the posterior left MCA distribution. No hemorrhage or mass effect. 2. Multifocal moderate stenosis within the bilateral MCA M2 distributions. No proximal intracranial arterial occlusion. Electronically Signed   By: Ulyses Jarred M.D.   On: 08/18/2016 06:05    Scheduled Meds: . aspirin  325 mg Oral QHS  . atorvastatin  40 mg Oral q1800  . clonazePAM  0.5 mg Oral BID  . enoxaparin (LOVENOX) injection  40 mg Subcutaneous Q24H  . feeding supplement (ENSURE ENLIVE)  237 mL Oral BID BM  . LORazepam  1 mg Intravenous Once  .  mometasone-formoterol  2 puff Inhalation BID  . multivitamin with minerals  1 tablet Oral Daily  . PARoxetine  10 mg Oral QHS   Continuous Infusions: . sodium chloride 50 mL/hr at 08/18/16 0016     LOS: 2 days    Chipper Oman, MD [facility] Pager [phone]  If 7PM-7AM, please contact night-coverage [email or web address] Password [facility] 08/19/2016, 6:04 PM

## 2016-08-19 NOTE — Consult Note (Signed)
Physical Medicine and Rehabilitation Consult Reason for Consult: Acute infarct posterior left MCA distribution Referring Physician: Family medicine   HPI: Stephanie [name] is a 81 y.o. right handed female with history of CAD with stenting maintain on aspirin 325 mg daily, COPD on home oxygen and memory loss followed by Dr. Melrose Nakayama neurologist in [place]. Per chart review patient lives alone independently prior to admission still driving short distances. Daughters in the area work. Presented 08/17/2016 with question expressive aphasia and left-sided weakness. CT/MRI showed acute infarct within the posterior left MCA distribution. No hemorrhage or mass effect. MRA with no proximal intracranial arterial occlusion. Patient did not receive TPA. Carotid Dopplers and no ICA stenosis. Echocardiogram pending. Neurology consulted currently remains on aspirin for CVA prophylaxis. Subcutaneous Lovenox for DVT prophylaxis. Physical and occupational therapy ongoing noted issues in regards to right field cut and decreased safety awareness and recommendations for physical medicine rehabilitation consult.   Review of Systems  Constitutional: Negative for chills and fever.  HENT: Negative for hearing loss.   Eyes: Negative for blurred vision and double vision.  Respiratory: Positive for cough and shortness of breath.   Cardiovascular: Positive for leg swelling. Negative for chest pain.  Gastrointestinal: Positive for constipation.  Genitourinary: Negative for dysuria, flank pain and hematuria.  Musculoskeletal: Positive for joint pain and myalgias.  Skin: Negative for rash.  Neurological: Positive for speech change and weakness. Negative for seizures.  Psychiatric/Behavioral: Positive for depression and memory loss.  All other systems reviewed and are negative.  Past Medical History:  Diagnosis Date  . Arthritis   . Complication of anesthesia   . COPD (chronic obstructive pulmonary  disease) ([place])   . Coronary artery disease 2013   stent  . Depression   . Edema   . Pneumonia    IN PAST  . PONV (postoperative nausea and vomiting)    Past Surgical History:  Procedure Laterality Date  . BALLOON ANGIOPLASTY, ARTERY  05/17/2009  . CATARACT EXTRACTION W/PHACO Left 05/24/2016   Procedure: CATARACT EXTRACTION PHACO AND INTRAOCULAR LENS PLACEMENT (IOC);  Surgeon: Birder Robson, MD;  Location: [facility] ORS;  Service: Ophthalmology;  Laterality: Left;  Lot# [id number] H [place]:   00:52.3 AP%:   27.3 CDE:  14.26   . CATARACT EXTRACTION W/PHACO Right 06/14/2016   Procedure: CATARACT EXTRACTION PHACO AND INTRAOCULAR LENS PLACEMENT (IOC);  Surgeon: Birder Robson, MD;  Location: [facility] ORS;  Service: Ophthalmology;  Laterality: Right;  Lot# [id number] H [place]: 01:13.4 AP%: 24.4 CDE: 17.85  . CORONARY ANGIOPLASTY     STENT   Family History  Problem Relation Age of Onset  . Hypertension Mother   . Diabetes Mother   . Pancreatic cancer Mother 10  . Suicidality Father 22   Social History:  reports that she has quit smoking. She has never used smokeless tobacco. She reports that she does not drink alcohol or use drugs. Allergies:  Allergies  Allergen Reactions  . Duloxetine Hcl     unknown  . Influenza Vaccines     unknown  . Other     STEROIDS  DON'T WORK FOR HER  . Venlafaxine     GI distress   Facility-Administered Medications Prior to Admission  Medication Dose Route Frequency Provider Last Rate Last Dose  . ipratropium-albuterol (DUONEB) 0.5-2.5 (3) MG/3ML nebulizer solution 3 mL  3 mL Nebulization Q4H PRN Lytle Butte, MD       Medications Prior to Admission  Medication Sig Dispense Refill  . acetaminophen (  TYLENOL) 500 MG tablet Take 1 tablet by mouth at bedtime.     Marland Kitchen albuterol (VENTOLIN HFA) 108 (90 Base) MCG/ACT inhaler Inhale 2 puffs into the lungs every 6 (six) hours as needed. 3 Inhaler 3  . aspirin 325 MG EC tablet Take 1 tablet by mouth at bedtime.     .  bisacodyl (DULCOLAX) 5 MG EC tablet Take 1 tablet (5 mg total) by mouth daily as needed for moderate constipation. 30 tablet 0  . budesonide-formoterol (SYMBICORT) 160-4.5 MCG/ACT inhaler Inhale 2 puffs into the lungs 2 (two) times daily. 3 Inhaler 3  . Calcium Carb-Cholecalciferol (CALCIUM 600+D) 600-800 MG-UNIT TABS Take 2 tablets by mouth daily.    . clonazePAM (KLONOPIN) 0.5 MG tablet Take 1 tablet (0.5 mg total) by mouth 2 (two) times daily. 30 tablet 0  . docusate sodium (COLACE) 100 MG capsule Take 200 mg by mouth at bedtime.    Marland Kitchen ibuprofen (ADVIL,MOTRIN) 200 MG tablet Take 400 mg by mouth daily as needed for headache or moderate pain.     Marland Kitchen PARoxetine (PAXIL) 10 MG tablet TAKE TWO TABLETS BY MOUTH AT BEDTIME DAILY (Patient taking differently: take 10 mgs at night daily) 60 tablet 2  . pravastatin (PRAVACHOL) 20 MG tablet Take 10 mg by mouth daily.     Marland Kitchen senna-docusate (SENOKOT-S) 8.6-50 MG tablet Take 1 tablet by mouth at bedtime as needed for mild constipation. 60 tablet 0  . tiotropium (SPIRIVA HANDIHALER) 18 MCG inhalation capsule Place 1 capsule (18 mcg total) into inhaler and inhale daily. 90 capsule 3    Home: Home Living Family/patient expects to be discharged to:: Private residence Living Arrangements: Alone Available Help at Discharge: Family, Available PRN/intermittently (daughters both work) Type of Home: Apartment Home Access: Stairs to enter Technical brewer of Steps: 2-3 small steps Home Layout: Two level Alternate Level Stairs-Number of Steps: 13 Alternate Level Stairs-Rails: Right Bathroom Shower/Tub: Chiropodist: Standard Home Equipment: Tub bench, Grab bars - tub/shower, Hand held shower head  Functional History: Prior Function Level of Independence: Independent Comments: Pt is very active still driving and running all her errands independently Functional Status:  Mobility: Bed Mobility General bed mobility comments: received in  recliner  Transfers Overall transfer level: Needs assistance Equipment used: None Transfers: Sit to/from Stand Sit to Stand: Min assist, Min guard General transfer comment: for safety, unsteady Ambulation/Gait Ambulation/Gait assistance: Min guard Ambulation Distance (Feet): 120 Feet Assistive device: None Gait Pattern/deviations: Decreased stride length, Shuffle, Drifts right/left General Gait Details: ran into wall on R minguard for safety, also assist around door back into room due to R visual and awareness deficits    ADL: ADL Overall ADL's : Needs assistance/impaired Upper Body Dressing : Minimal assistance Lower Body Dressing: Minimal assistance Toilet Transfer: Minimal assistance Functional mobility during ADLs: Minimal assistance, Min guard General ADL Comments: Min guard - Min assist with mobility.  Pt requires cues for attention to Rt visual field during self-care tasks and mobility due to Rt field cut.  Pt with difficulty with self-feeding due to impaired attention to Rt and sensation in RUE  Cognition: Cognition Overall Cognitive Status: Difficult to assess Orientation Level: Oriented to person, Oriented to time, Oriented to situation Cognition Arousal/Alertness: Awake/alert Behavior During Therapy: WFL for tasks assessed/performed Overall Cognitive Status: Difficult to assess Area of Impairment: Attention, Awareness Current Attention Level: Sustained Awareness:  (decreased R side awareness) General Comments: Wernicke's aphasia Difficult to assess due to: Impaired communication  Blood pressure 108/87, pulse  83, temperature 98.7 F (37.1 C), temperature source Oral, resp. rate 20, height '5\' 2"'$  (1.575 m), weight 58.2 kg (128 lb 3.2 oz), SpO2 99 %. Physical Exam  Constitutional: She appears well-developed.  HENT:  Head: Normocephalic.  Eyes:  Right homonymous hemianopsia  Neck: Normal range of motion. Neck supple. No thyromegaly present.  Cardiovascular: Normal  rate and regular rhythm.   Respiratory: Effort normal and breath sounds normal. No respiratory distress.  GI: Soft. Bowel sounds are normal. She exhibits no distension. There is no tenderness.  Neurological: She is alert.  Patient was able to express her name and age. Follows simple commands. Dense right homonymous hemianopsia. Expressive word finding deficits. Occasional issues with receptive. Motor and verbal apraxia also. Could name some of family members in the room. Strength grossly 4/5 in all 4 limbs. No focal sensory deficits. Senses pain in all 4.   Skin: Skin is warm and dry.    Results for orders placed or performed during the hospital encounter of 08/17/16 (from the past 24 hour(s))  Troponin I     Status: None   Collection Time: 08/18/16  9:45 AM  Result Value Ref Range   Troponin I <0.03 <0.03 ng/mL  Basic metabolic panel     Status: Abnormal   Collection Time: 08/19/16  3:39 AM  Result Value Ref Range   Sodium 141 135 - 145 mmol/L   Potassium 3.6 3.5 - 5.1 mmol/L   Chloride 106 101 - 111 mmol/L   CO2 26 22 - 32 mmol/L   Glucose, Bld 103 (H) 65 - 99 mg/dL   BUN 9 6 - 20 mg/dL   Creatinine, Ser 0.99 0.44 - 1.00 mg/dL   Calcium 8.7 (L) 8.9 - 10.3 mg/dL   GFR calc non Af Amer 53 (L) >60 mL/min   GFR calc Af Amer >60 >60 mL/min   Anion gap 9 5 - 15  CBC with Differential/Platelet     Status: Abnormal   Collection Time: 08/19/16  3:39 AM  Result Value Ref Range   WBC 5.3 4.0 - 10.5 K/uL   RBC 3.41 (L) 3.87 - 5.11 MIL/uL   Hemoglobin 10.1 (L) 12.0 - 15.0 g/dL   HCT 31.5 (L) 36.0 - 46.0 %   MCV 92.4 78.0 - 100.0 fL   MCH 29.6 26.0 - 34.0 pg   MCHC 32.1 30.0 - 36.0 g/dL   RDW 14.7 11.5 - 15.5 %   Platelets 198 150 - 400 K/uL   Neutrophils Relative % 60 %   Neutro Abs 3.2 1.7 - 7.7 K/uL   Lymphocytes Relative 28 %   Lymphs Abs 1.5 0.7 - 4.0 K/uL   Monocytes Relative 10 %   Monocytes Absolute 0.5 0.1 - 1.0 K/uL   Eosinophils Relative 2 %   Eosinophils Absolute 0.1 0.0  - 0.7 K/uL   Basophils Relative 0 %   Basophils Absolute 0.0 0.0 - 0.1 K/uL   Ct Head Wo Contrast  Result Date: 08/17/2016 CLINICAL DATA:  Aphasia, right-sided weakness. EXAM: CT HEAD WITHOUT CONTRAST TECHNIQUE: Contiguous axial images were obtained from the base of the skull through the vertex without intravenous contrast. COMPARISON:  None. FINDINGS: Brain: Low density and sulcal effacement is seen involving the left temporal and parietal cortex consistent with acute infarction. No hemorrhage is noted. Probable old left cerebellar infarction is noted. No midline shift is noted. Vascular: Atherosclerosis of carotid siphons is noted. Skull: Normal. Negative for fracture or focal lesion. Sinuses/Orbits: Sphenoid sinusitis is noted.  Other: None. 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 Sphenoid sinusitis. Electronically Signed   By: Marijo Conception, M.D.   On: 08/17/2016 16:41   Mr Brain Wo Contrast  Result Date: 08/18/2016 CLINICAL DATA:  Possible CVA. Right facial droop. Right arm and leg weakness. EXAM: MRI HEAD WITHOUT CONTRAST MRA HEAD WITHOUT CONTRAST TECHNIQUE: Multiplanar, multiecho pulse sequences of the brain and surrounding structures were obtained without intravenous contrast. Angiographic images of the head were obtained using MRA technique without contrast. COMPARISON:  Head CT 08/17/2016 FINDINGS: MRI HEAD FINDINGS Brain: There is diffusion restriction within the posterior left MCA distribution, involving the posterior left frontal lobe, the left insular cortex and the left parietal and temporal lobes. There is no associated hemorrhage. There is hyperintense T2 weighted signal within the corresponding distribution of the ischemia. There is otherwise mild periventricular T2 hyperintensity, but no other focal parenchymal signal abnormality. No mass lesion or midline shift. No hydrocephalus or extra-axial fluid collection. The midline structures are normal. No  age advanced or lobar predominant atrophy. Vascular: Major intracranial arterial and venous sinus flow voids are preserved. No evidence of chronic microhemorrhage or amyloid angiopathy. Skull and upper cervical spine: The visualized skull base, calvarium, upper cervical spine and extracranial soft tissues are normal. Sinuses/Orbits: There is fluid within the left sphenoid sinus. Normal orbits. MRA HEAD FINDINGS Intracranial internal carotid arteries: Normal. Anterior cerebral arteries: Normal. Middle cerebral arteries: There is multifocal moderate narrowing of both middle cerebral artery M2 segments. Posterior communicating arteries: Present bilaterally. Posterior cerebral arteries: Bilateral fetal origins, but otherwise normal. Basilar artery: Normal. Vertebral arteries: Left dominant. The right vertebral artery is very diminutive and may terminate in the posterior inferior cerebellar artery. Superior cerebellar arteries: Normal. Anterior inferior cerebellar arteries: Not visualized, which is not uncommon. Posterior inferior cerebellar arteries: Normal. IMPRESSION: 1. Acute infarct within the posterior left MCA distribution. No hemorrhage or mass effect. 2. Multifocal moderate stenosis within the bilateral MCA M2 distributions. No proximal intracranial arterial occlusion. Electronically Signed   By: Ulyses Jarred M.D.   On: 08/18/2016 06:05   Mr Jodene Nam Head/brain KK Cm  Result Date: 08/18/2016 CLINICAL DATA:  Possible CVA. Right facial droop. Right arm and leg weakness. EXAM: MRI HEAD WITHOUT CONTRAST MRA HEAD WITHOUT CONTRAST TECHNIQUE: Multiplanar, multiecho pulse sequences of the brain and surrounding structures were obtained without intravenous contrast. Angiographic images of the head were obtained using MRA technique without contrast. COMPARISON:  Head CT 08/17/2016 FINDINGS: MRI HEAD FINDINGS Brain: There is diffusion restriction within the posterior left MCA distribution, involving the posterior left  frontal lobe, the left insular cortex and the left parietal and temporal lobes. There is no associated hemorrhage. There is hyperintense T2 weighted signal within the corresponding distribution of the ischemia. There is otherwise mild periventricular T2 hyperintensity, but no other focal parenchymal signal abnormality. No mass lesion or midline shift. No hydrocephalus or extra-axial fluid collection. The midline structures are normal. No age advanced or lobar predominant atrophy. Vascular: Major intracranial arterial and venous sinus flow voids are preserved. No evidence of chronic microhemorrhage or amyloid angiopathy. Skull and upper cervical spine: The visualized skull base, calvarium, upper cervical spine and extracranial soft tissues are normal. Sinuses/Orbits: There is fluid within the left sphenoid sinus. Normal orbits. MRA HEAD FINDINGS Intracranial internal carotid arteries: Normal. Anterior cerebral arteries: Normal. Middle cerebral arteries: There is multifocal moderate narrowing of both middle cerebral artery M2 segments. Posterior communicating arteries: Present bilaterally. Posterior cerebral arteries: Bilateral  fetal origins, but otherwise normal. Basilar artery: Normal. Vertebral arteries: Left dominant. The right vertebral artery is very diminutive and may terminate in the posterior inferior cerebellar artery. Superior cerebellar arteries: Normal. Anterior inferior cerebellar arteries: Not visualized, which is not uncommon. Posterior inferior cerebellar arteries: Normal. IMPRESSION: 1. Acute infarct within the posterior left MCA distribution. No hemorrhage or mass effect. 2. Multifocal moderate stenosis within the bilateral MCA M2 distributions. No proximal intracranial arterial occlusion. Electronically Signed   By: Ulyses Jarred M.D.   On: 08/18/2016 06:05    Assessment/Plan: Diagnosis: left MCA infarct with aphasia and dense right homonymous hemianopsia 1. Does the need for close, 24 hr/day  medical supervision in concert with the patient's rehab needs make it unreasonable for this patient to be served in a less intensive setting? Yes 2. Co-Morbidities requiring supervision/potential complications: anxiety, COPD, depression 3. Due to bladder management, bowel management, safety, skin/wound care, disease management, medication administration, pain management and patient education, does the patient require 24 hr/day rehab nursing? Yes 4. Does the patient require coordinated care of a physician, rehab nurse, PT (1-2 hrs/day, 5 days/week), OT (1-2 hrs/day, 5 days/week) and SLP (1-2 hrs/day, 5 days/week) to address physical and functional deficits in the context of the above medical diagnosis(es)? Yes Addressing deficits in the following areas: balance, endurance, locomotion, strength, transferring, bowel/bladder control, bathing, dressing, feeding, grooming, toileting, cognition, speech, language, swallowing and psychosocial support 5. Can the patient actively participate in an intensive therapy program of at least 3 hrs of therapy per day at least 5 days per week? Yes 6. The potential for patient to make measurable gains while on inpatient rehab is excellent 7. Anticipated functional outcomes upon discharge from inpatient rehab are modified independent and supervision  with PT, modified independent and supervision with OT, modified independent, supervision and min assist with SLP. 8. Estimated rehab length of stay to reach the above functional goals is: 7-10 days 9. Does the patient have adequate social supports and living environment to accommodate these discharge functional goals? Yes 10. Anticipated D/C setting: Home 11. Anticipated post D/C treatments: HH therapy and Outpatient therapy 12. Overall Rehab/Functional Prognosis: excellent  RECOMMENDATIONS: This patient's condition is appropriate for continued rehabilitative care in the following setting: CIR Patient has agreed to  participate in recommended program. Yes Note that insurance prior authorization may be required for reimbursement for recommended care.  Comment: Spoke with family at length regarding expectations of rehab. I think she has the potential to be largely independent (given language issues) after inpatient rehab stay. Still might require some level of supervision once home however, for safety.  Rehab Admissions Coordinator to follow up.  Thanks,  Meredith Staggers, MD, Mellody Drown    Cathlyn Parsons., PA-C 08/19/2016

## 2016-08-19 NOTE — Progress Notes (Signed)
PT Cancellation Note  Patient Details Name: Stephanie [name] MRN: [id number] DOB: 12-Mar-1936   Cancelled Treatment:    Reason Eval/Treat Not Completed: Patient at procedure or test/unavailable (Off unit for bubble test.  )   Cristela Blue 08/19/2016, 4:42 PM  Governor Rooks, PTA pager [phone]

## 2016-08-19 NOTE — Progress Notes (Signed)
STROKE TEAM PROGRESS NOTE   SUBJECTIVE (INTERVAL HISTORY) Patient just back from TEE. They could not pass the probe in order to perform the TEE, so it was aborted. Had planned TEE under anesthesia for Monday. Dr. Leonie Man and Dr. Rayann Heman discussed similar case this week, given risk of anesthesia, will place loop recorder today and perform TCD with bubble study to rule out PFO in this lady at risk. Dr. Baird Kay aware and agreeable to plan. EP cancelling TEE for Monday.    OBJECTIVE Temp:  [97.8 F (36.6 C)-98.9 F (37.2 C)] 97.8 F (36.6 C) (01/12 1015) Pulse Rate:  [73-86] 75 (01/12 1025) Cardiac Rhythm: Normal sinus rhythm (01/12 1025) Resp:  [11-20] 12 (01/12 1025) BP: (102-178)/(45-92) 130/64 (01/12 1025) SpO2:  [92 %-99 %] 97 % (01/12 1025)  CBC:   Recent Labs Lab 08/17/16 1752 08/19/16 0339  WBC 7.3 5.3  NEUTROABS 5.8 3.2  HGB 11.6* 10.1*  HCT 34.9* 31.5*  MCV 91.4 92.4  PLT 190 518    Basic Metabolic Panel:   Recent Labs Lab 08/17/16 1752 08/19/16 0339  NA 138 141  K 4.0 3.6  CL 104 106  CO2 23 26  GLUCOSE 93 103*  BUN 13 9  CREATININE 1.04* 0.99  CALCIUM 10.0 8.7*    Lipid Panel:     Component Value Date/Time   CHOL 222 (H) 08/18/2016 0333   CHOL 203 (H) 07/06/2016 0946   TRIG 100 08/18/2016 0333   HDL 103 08/18/2016 0333   HDL 112 07/06/2016 0946   CHOLHDL 2.2 08/18/2016 0333   VLDL 20 08/18/2016 0333   LDLCALC 99 08/18/2016 0333   LDLCALC 77 07/06/2016 0946   HgbA1c: No results found for: HGBA1C Urine Drug Screen:     Component Value Date/Time   LABOPIA NONE DETECTED 08/17/2016 1634   COCAINSCRNUR NONE DETECTED 08/17/2016 1634   LABBENZ NONE DETECTED 08/17/2016 1634   AMPHETMU NONE DETECTED 08/17/2016 1634   THCU NONE DETECTED 08/17/2016 1634   LABBARB NONE DETECTED 08/17/2016 1634     IMAGING  Ct Head Wo Contrast 08/17/2016 Acute infarction of left temporal and parietal cortex is noted. Sphenoid sinusitis.   Mr Brain Wo Contrast Mr  Jodene Nam Head/brain Wo Cm 08/18/2016 1. Acute infarct within the posterior left MCA distribution. No hemorrhage or mass effect. 2. Multifocal moderate stenosis within the bilateral MCA M2 distributions. No proximal intracranial arterial occlusion.   Carotid Doppler   There is 1-39% bilateral ICA stenosis. Vertebral artery flow is antegrade.    TEE Aborted. Unable to pass probe  2D Echocardiogram  - Left ventricle: The cavity size was normal. Wall thickness was normal. Systolic function was normal. The estimated ejection fraction was in the range of 55% to 60%. Doppler parameters are consistent with abnormal left ventricular relaxation (grade 1 diastolic dysfunction). - Atrial septum: No defect or patent foramen ovale was identified. - Pulmonary arteries: PA peak pressure: 36 mm Hg (S).    PHYSICAL EXAM A pleasant elderly lady not in distress. . Afebrile. Head is nontraumatic. Neck is supple without bruit.    Cardiac exam no murmur or gallop. Lungs are clear to auscultation. Distal pulses are well felt. Neurological Exam :  Awake alert moderate expressive aphasia. Follows simple midline and one step commands. Extraocular movements are full range.no clear nystagmus. Dense right homonymous hemianopsia. Mild right lower facial weakness. Tongue midline. Motor system exam shows no drift but mild weakness and of the right hand grip, intrinsic hand muscles, right hip and ankle  dorsiflexors only. Normal strength on the left.   ASSESSMENT/PLAN [name] is a 81 y.o. female with history of COPD presenting with aphasia and field cut. She did not receive IV t-PA due to delay in arrival.   Stroke:  Large left posterior MCA infarct, embolic versus large vessel disease. Workup underway.  Resultant  expressive and receptive aphasia, right visual field  MRI  Large left posterior MCA infarct  MRA  moderate stenosis vs clot left M2, poorly visualized bilateral vessels  Carotid Doppler  No  significant stenosis   2D Echo  EF 55-60%. No source of embolus   TEE aborted. Could not pass probe.   TCD w/ bubble pending  Loop recorder placed 08/19/2016 ([employer])  LDL 99  HgbA1c pending   Lovenox 40 mg sq daily for VTE prophylaxis Diet NPO time specified  aspirin 81 mg daily prior to admission, now on aspirin 325 mg daily  Patient counseled to be compliant with her antithrombotic medications  Ongoing aggressive stroke risk factor management  Therapy recommendations:  CIR. consult in place  Disposition:  pending   Followed by Dr. Melrose Nakayama, neurologist in [place] for memory loss - super mild, 30/30 on MMSE  Hypertension  Stable  Permissive hypertension (OK if < 220/120) but gradually normalize in 5-7 days  Long-term BP goal normotensive  Hyperlipidemia  Home meds:  pravachol 20, resumed in hospital  LDL 99, goal < 70  Consider increase pravachol dose  Continue statin at discharge  Other Stroke Risk Factors  Advanced age  Coronary artery disease - stent, angioplasty  Other Active Problems  Elevated troponin secondary to demand ischemia  COPD asymptomatic  Hospital day # [place] for Pager information 08/19/2016 1:10 PM  I have personally examined this patient, reviewed notes, independently viewed imaging studies, participated in medical decision making and plan of care.ROS completed by me personally and pertinent positives fully documented  I have made any additions or clarifications directly to the above note. Agree with note above. Discuss with patient and Dr. Curt Bears. Greater than 50% time during this 25 minute visit was spent on counseling and coordination of care about an embolic stroke risk, evaluation plan and treatment.  Antony Contras, MD Medical Director [facility] Pager: [phone] 08/19/2016 2:48 PM  To contact Stroke Continuity provider, please refer to [email or web address] After hours,  contact General Neurology

## 2016-08-19 NOTE — Clinical Social Work Note (Signed)
Clinical Social Work Assessment  Patient Details  Name: Stephanie [name] MRN: [id number] Date of Birth: [birth year]  Date of referral:  08/19/16               Reason for consult:  Facility Placement                Permission sought to share information with:  Facility Sport and exercise psychologist, Family Supports Permission granted to share information::  No  Name::     Chartered certified accountant::  SNFs  Relationship::  Daughter  Contact Information:  [phone]  Housing/Transportation Living arrangements for the past 2 months:  [place], [place] of Information:  Adult Children Patient Interpreter Needed:  None Criminal Activity/Legal Involvement Pertinent to Current Situation/Hospitalization:  No - Comment as needed Significant Relationships:  Adult Children Lives with:  Adult Children Do you feel safe going back to the place where you live?  No Need for family participation in patient care:  Yes (Comment)  Care giving concerns:  CSW received consult for possible SNF placement at time of discharge. CSW spoke with patient's daughter regarding PT recommendation of SNF placement at time of discharge. Per patient's daughter, patient is currently unable to care for herself at their home given patient's current physical needs and fall risk. Patient's daughter expressed understanding of PT recommendation and is agreeable to SNF placement at time of discharge. CSW to continue to follow and assist with discharge planning needs.   Social Worker assessment / plan:  CSW spoke with patient's daughter concerning possibility of rehab at [facility] before returning home.  Employment status:  Retired Advertising copywriter PT Recommendations:  [place], [place] / Referral to community resources:  [place]  Patient/Family's Response to care:  Patient's daughter recognizes need for rehab before returning home  and is agreeable to a SNF since CIR was denied by insurance. Daughter reported preference for [facility] since patient has been there before.  Patient/Family's Understanding of and Emotional Response to Diagnosis, Current Treatment, and Prognosis:  Patient/family is realistic regarding therapy needs and expressed being hopeful for SNF placement. Patient's daughter expressed understanding of CSW role and discharge process. No questions/concerns about plan or treatment.    Emotional Assessment Appearance:  Appears stated age Attitude/Demeanor/Rapport:  Unable to Assess Affect (typically observed):  Unable to Assess Orientation:  Oriented to Self Alcohol / Substance use:  Not Applicable Psych involvement (Current and /or in the community):  No (Comment)  Discharge Needs  Concerns to be addressed:  Care Coordination Readmission within the last 30 days:  Yes Current discharge risk:  None Barriers to Discharge:  Continued Medical Work up   [employer], Latanya Presser 08/19/2016, 4:34 PM

## 2016-08-19 NOTE — Interval H&P Note (Signed)
History and Physical Interval Note:  08/19/2016 8:58 AM  Stephanie [name]  has presented today for surgery, with the diagnosis of STROKE  The various methods of treatment have been discussed with the patient and family. After consideration of risks, benefits and other options for treatment, the patient has consented to  Procedure(s): TRANSESOPHAGEAL ECHOCARDIOGRAM (TEE) (N/A) as a surgical intervention .  The patient's history has been reviewed, patient examined, no change in status, stable for surgery.  I have reviewed the patient's chart and labs.  Questions were answered to the patient's satisfaction.     Ena Dawley

## 2016-08-20 ENCOUNTER — Inpatient Hospital Stay (HOSPITAL_COMMUNITY): Payer: PPO

## 2016-08-20 DIAGNOSIS — I639 Cerebral infarction, unspecified: Secondary | ICD-10-CM

## 2016-08-20 DIAGNOSIS — I63412 Cerebral infarction due to embolism of left middle cerebral artery: Secondary | ICD-10-CM

## 2016-08-20 NOTE — Progress Notes (Signed)
Physical Therapy Treatment Patient Details Name: Stephanie [name] MRN: [id number] DOB: [birth year] Today's Date: 08/20/2016    History of Present Illness 81 y.o. female with medical history significant of CAD and COPD on home O2 presenting with possible CVA. Pt found to have acute L sided temporal/parietal lobe CVA    PT Comments    Pt progressing with mobility.  Ambulated 200' with Min guard completing gt/balance challenges without LOB noted. Cont with current POC to maximize functional mobility prior to d/c to next venue.  D/c recommendations updated at this time due to per chart review, pt's insurance denied CIR approval and family requesting ST-SNF prior to d/c home.     Follow Up Recommendations  SNF (per chart review, Insurance denied CIR and family now requesting ST-SNF)    Equipment Recommendations       Recommendations for Other Services Rehab consult     Precautions / Restrictions Precautions Precautions: Fall Precaution Comments: Rt field cut Restrictions Weight Bearing Restrictions: No    Mobility  Bed Mobility               General bed mobility comments: pt sitting in recliner upon arrival   Transfers Overall transfer level: Needs assistance Equipment used: None Transfers: Sit to/from Stand Sit to Stand: Min guard         General transfer comment: guarding for safety.  no unsteadiness noted  Ambulation/Gait Ambulation/Gait assistance: Min guard Ambulation Distance (Feet): 200 Feet Assistive device: None Gait Pattern/deviations: Decreased stride length Gait velocity: decreased   General Gait Details: cues for increased step length bilaterally.  no staggering/unsteadiness noted.  completed gt speed changes, 360 degree turns, and stops on command.  No LOB noted.    Stairs            Wheelchair Mobility    Modified Rankin (Stroke Patients Only)       Balance                                    Cognition  Arousal/Alertness: Awake/alert Behavior During Therapy: WFL for tasks assessed/performed Overall Cognitive Status: No family/caregiver present to determine baseline cognitive functioning                 General Comments: Wernicke's aphasia    Exercises      General Comments        Pertinent Vitals/Pain      Home Living                      Prior Function            PT Goals (current goals can now be found in the care plan section) Acute Rehab PT Goals Patient Stated Goal: TO go to rehab PT Goal Formulation: With patient/family Time For Goal Achievement: 08/25/16 Progress towards PT goals: Progressing toward goals    Frequency    Min 4X/week      PT Plan Current plan remains appropriate    Co-evaluation             End of Session Equipment Utilized During Treatment: Gait belt Activity Tolerance: Patient tolerated treatment well Patient left: in chair;with call bell/phone within reach     Time: 1029-1050 PT Time Calculation (min) (ACUTE ONLY): 21 min  Charges:  $Gait Training: 8-22 mins  G Codes:      Sena Hitch 08/20/2016, 11:02 AM   Sarajane Marek, PTA [phone] 08/20/2016

## 2016-08-20 NOTE — Progress Notes (Addendum)
VASCULAR LAB PRELIMINARY  PRELIMINARY  PRELIMINARY  PRELIMINARY  Bilateral lower extremity venous duplex completed.    Preliminary report:  There is subacute DVT noted in the proximal popliteal vein of the left lower extremity.  There is chronic superficial thrombosis noted in the greater saphenous vein from mid to proximal calf. There is a Baker's cyst noted in the right popliteal fossa.   Called report to [place], RN  Mauro Kaufmann, [place], RVT 08/20/2016, 4:48 PM

## 2016-08-20 NOTE — Progress Notes (Signed)
STROKE TEAM PROGRESS NOTE   SUBJECTIVE (INTERVAL HISTORY) Pt son and daughter at bedside. Failed TEE yesterday but loop recorder put in. Pending TCD bubble study and LE venous doppler. SNF may have bed tomorrow.    OBJECTIVE Temp:  [97.2 F (36.2 C)-98.4 F (36.9 C)] 97.5 F (36.4 C) (01/13 0958) Pulse Rate:  [84-92] 84 (01/13 0958) Cardiac Rhythm: Normal sinus rhythm (01/13 0800) Resp:  [16-18] 18 (01/13 0958) BP: (120-155)/(53-73) 143/62 (01/13 0958) SpO2:  [94 %-98 %] 96 % (01/13 0958)  CBC:   Recent Labs Lab 08/17/16 1752 08/19/16 0339  WBC 7.3 5.3  NEUTROABS 5.8 3.2  HGB 11.6* 10.1*  HCT 34.9* 31.5*  MCV 91.4 92.4  PLT 190 062    Basic Metabolic Panel:   Recent Labs Lab 08/17/16 1752 08/19/16 0339  NA 138 141  K 4.0 3.6  CL 104 106  CO2 23 26  GLUCOSE 93 103*  BUN 13 9  CREATININE 1.04* 0.99  CALCIUM 10.0 8.7*    Lipid Panel:     Component Value Date/Time   CHOL 222 (H) 08/18/2016 0333   CHOL 203 (H) 07/06/2016 0946   TRIG 100 08/18/2016 0333   HDL 103 08/18/2016 0333   HDL 112 07/06/2016 0946   CHOLHDL 2.2 08/18/2016 0333   VLDL 20 08/18/2016 0333   LDLCALC 99 08/18/2016 0333   LDLCALC 77 07/06/2016 0946   HgbA1c: No results found for: HGBA1C Urine Drug Screen:     Component Value Date/Time   LABOPIA NONE DETECTED 08/17/2016 1634   COCAINSCRNUR NONE DETECTED 08/17/2016 1634   LABBENZ NONE DETECTED 08/17/2016 1634   AMPHETMU NONE DETECTED 08/17/2016 1634   THCU NONE DETECTED 08/17/2016 1634   LABBARB NONE DETECTED 08/17/2016 1634     IMAGING I have personally reviewed the radiological images below and agree with the radiology interpretations.  Ct Head Wo Contrast 08/17/2016 Acute infarction of left temporal and parietal cortex is noted. Sphenoid sinusitis.   Mr Brain Wo Contrast Mr Jodene Nam Head/brain Wo Cm 08/18/2016 1. Acute infarct within the posterior left MCA distribution. No hemorrhage or mass effect.  2. Multifocal moderate  stenosis within the bilateral MCA M2 distributions. No proximal intracranial arterial occlusion.   Carotid Doppler   There is 1-39% bilateral ICA stenosis. Vertebral artery flow is antegrade.    TEE Aborted. Unable to pass probe  2D Echocardiogram  - Left ventricle: The cavity size was normal. Wall thickness was normal. Systolic function was normal. The estimated ejection fraction was in the range of 55% to 60%. Doppler parameters are consistent with abnormal left ventricular relaxation (grade 1 diastolic dysfunction). - Atrial septum: No defect or patent foramen ovale was identified. - Pulmonary arteries: PA peak pressure: 36 mm Hg (S).   TCD bubble study pending  LE venous Doppler pending   PHYSICAL EXAM  Temp:  [97.2 F (36.2 C)-98.4 F (36.9 C)] 97.5 F (36.4 C) (01/13 0958) Pulse Rate:  [84-92] 84 (01/13 0958) Resp:  [16-18] 18 (01/13 0958) BP: (120-155)/(53-73) 143/62 (01/13 0958) SpO2:  [94 %-98 %] 96 % (01/13 0958)  General - Well nourished, well developed, in no apparent distress.  Ophthalmologic - Fundi not visualized due to noncooperation.  Cardiovascular - Regular rate and rhythm.  Mental Status -  Awake, alert, difficulty with orientation questions due to aphasia Expressive aphasia, not able to name or repeat. Able to follow simple commands. No neglect  Cranial Nerves II - XII - II - Visual field intact OU. III, IV,  VI - Extraocular movements intact. V - Facial sensation intact bilaterally. VII - Facial movement intact bilaterally. VIII - Hearing & vestibular intact bilaterally. X - Palate elevates symmetrically. XI - Chin turning & shoulder shrug intact bilaterally. XII - Tongue protrusion intact.  Motor Strength - The patient's strength was normal in all extremities and pronator drift was absent.  Bulk was normal and fasciculations were absent.   Motor Tone - Muscle tone was assessed at the neck and appendages and was normal.  Reflexes - The  patient's reflexes were 1+ in all extremities and she had no pathological reflexes.  Sensory - Light touch, temperature/pinprick were assessed and were symmetrical.    Coordination - The patient had normal movements in the hands with no ataxia or dysmetria.  Tremor was absent.  Gait and Station - deferred.   ASSESSMENT/PLAN Stephanie [name] is a 81 y.o. female with history of COPD presenting with aphasia and field cut. She did not receive IV t-PA due to delay in arrival.   Stroke:  left posterior MCA infarct, embolic pattern, source unclear  Resultant  expressive aphasia  MRI  left posterior MCA infarct  MRA  moderate stenosis left M2, poorly visualized bilateral vessels  Carotid Doppler  No significant stenosis   2D Echo  EF 55-60%. No source of embolus   TEE aborted. Could not pass probe.   LE venous Doppler pending  TCD w/ bubble pending  Loop recorder placed 08/19/2016 ([employer])  LDL 99  HgbA1c - pending  Lovenox 40 mg sq daily for VTE prophylaxis Diet Heart Room service appropriate? Yes; Fluid consistency: Thin  aspirin 81 mg daily prior to admission, now on Plavix 75 mg daily. Continue Plavix on discharge  Patient counseled to be compliant with her antithrombotic medications  Ongoing aggressive stroke risk factor management  Therapy recommendations:  SNF  Disposition:  pending   Followed by Dr. Melrose Nakayama, neurologist in [place] for memory loss - super mild, 30/30 on MMSE  Hypertension  Stable  Permissive hypertension (OK if < 220/120) but gradually normalize in 5-7 days  Long-term BP goal normotensive  Hyperlipidemia  Home meds:  pravachol 20, resumed in hospital  LDL 99, goal < 70  Now on Lipitor 40 mg daily  Continue statin at discharge  Other Stroke Risk Factors  Advanced age  Coronary artery disease s/p stent, angioplasty  Other Active Problems  Elevated troponin secondary to demand ischemia  COPD asymptomatic  Hospital day  # 3  Rosalin Hawking, MD PhD Stroke Neurology 08/20/2016 4:53 PM   To contact Stroke Continuity provider, please refer to [email or web address] After hours, contact General Neurology

## 2016-08-20 NOTE — Progress Notes (Addendum)
PROGRESS NOTE [facility]   Stephanie [name]   ZMO:[id number] DOB: [birth year]  DOA: 08/17/2016 PCP: Vernie Murders, PA   Brief Narrative:  81 year old female with medical history significant for CAD and COPD not on home oxygen presented to the ED with aphasia and left-sided weakness, CT head showed acute infarct in the left temporal and parietal cortex. Patient admitted for further evaluation, neurology was consulted TPA was not given due to delay in arrival.  Subjective: Doing well, aphasia improving, awaiting for SNF placement    Assessment & Plan: Acute CVA - large left posterior MCA infarct Expressive aphasia and loss of right visual field  MRI/MRA - shows MCA infarct/moderate stenosis versus clot left M2 Carotid Doppler no significant stenosis Aspirin 325 daily ECHO no evidence of thrombus, EF 55-65% G1DD PT recommending CIR Neurology recommendations appreciated - LE doppler  LDL mild elevated will increase statin prior to d/c  Neurology recommended TCD with bubble study, this could be done as outpatient if bed become available for rehab.   Hypertension - satble  Allow permissive hypertension Monitor BP Treat BP only if > 220/120 Patient not taking chronic BP medications  Elevated troponin Likely secondary to demand ischemia, no EKG changes,  now back to normal  COPD - asymptomatic at this time Continue home medications - DuoNeb and Dulera  ADDENDUM 5:59 pm Doppler ultrasound came back positive for DVT - case discussed with neurologist in regards of anticoagulation. Given the sites of the CVA will hold on anticoagulation for now continue on Plavix will probably recommend IVC filter.  DVT prophylaxis: Lovenox Code Status: Full Family Communication: Family at bedside Disposition Plan: SNF - awaiting approval    Consultants:   Neurology   Procedures:   ECHO - Pending   Antimicrobials:  None   Objective: Vitals:   08/19/16 2159 08/20/16 0150  08/20/16 0559 08/20/16 0958  BP: (!) 120/53 131/68 (!) 155/73 (!) 143/62  Pulse: 91 90 88 84  Resp: '16 16 16 18  '$ Temp: 98.4 F (36.9 C) 97.2 F (36.2 C) 97.3 F (36.3 C) 97.5 F (36.4 C)  TempSrc: Oral Oral Oral Oral  SpO2: 97% 94% 97% 96%  Weight:      Height:        Intake/Output Summary (Last 24 hours) at 08/20/16 1325 Last data filed at 08/20/16 0000  Gross per 24 hour  Intake             2250 ml  Output                0 ml  Net             2250 ml   Filed Weights   08/17/16 1542 08/17/16 2140  Weight: 63.5 kg (140 lb) 58.2 kg (128 lb 3.2 oz)    Examination:   General exam: out of bed to chair Respiratory system: CTA b/l no wheezing Cardiovascular system: S1S2 no murmurs  Central nervous system: Alert and oriented. Expressive aphasia, mild right site weakness upper > lower, good ROM Extremities: No pedal edema. Symmetric, strength 4/5 L > R     Data Reviewed: I have personally reviewed following labs and imaging studies  CBC:  Recent Labs Lab 08/17/16 1752 08/19/16 0339  WBC 7.3 5.3  NEUTROABS 5.8 3.2  HGB 11.6* 10.1*  HCT 34.9* 31.5*  MCV 91.4 92.4  PLT 190 035   Basic Metabolic Panel:  Recent Labs Lab 08/17/16 1752 08/19/16 0339  NA 138 141  K 4.0  3.6  CL 104 106  CO2 23 26  GLUCOSE 93 103*  BUN 13 9  CREATININE 1.04* 0.99  CALCIUM 10.0 8.7*   GFR: Estimated Creatinine Clearance: 37 mL/min (by C-G formula based on SCr of 0.99 mg/dL). Liver Function Tests:  Recent Labs Lab 08/17/16 1752  AST 20  ALT 19  ALKPHOS 59  BILITOT 0.3  PROT 6.0*  ALBUMIN 3.4*   No results for input(s): LIPASE, AMYLASE in the last 168 hours. No results for input(s): AMMONIA in the last 168 hours. Coagulation Profile:  Recent Labs Lab 08/17/16 1752  INR 0.92   Cardiac Enzymes:  Recent Labs Lab 08/17/16 2210 08/18/16 0333 08/18/16 0945  TROPONINI 0.04* 0.06* <0.03   BNP (last 3 results) No results for input(s): PROBNP in the last 8760  hours. HbA1C: No results for input(s): HGBA1C in the last 72 hours. CBG: No results for input(s): GLUCAP in the last 168 hours. Lipid Profile:  Recent Labs  08/18/16 0333  CHOL 222*  HDL 103  LDLCALC 99  TRIG 100  CHOLHDL 2.2   Thyroid Function Tests:  Recent Labs  08/18/16 0333  TSH 1.619   Anemia Panel: No results for input(s): VITAMINB12, FOLATE, FERRITIN, TIBC, IRON, RETICCTPCT in the last 72 hours. Sepsis Labs: No results for input(s): PROCALCITON, LATICACIDVEN in the last 168 hours.  No results found for this or any previous visit (from the past 240 hour(s)).    Radiology Studies: No results found.  Scheduled Meds: . atorvastatin  40 mg Oral q1800  . clonazePAM  0.5 mg Oral BID  . clopidogrel  75 mg Oral Daily  . enoxaparin (LOVENOX) injection  40 mg Subcutaneous Q24H  . feeding supplement (ENSURE ENLIVE)  237 mL Oral BID BM  . LORazepam  1 mg Intravenous Once  . mometasone-formoterol  2 puff Inhalation BID  . multivitamin with minerals  1 tablet Oral Daily  . PARoxetine  10 mg Oral QHS   Continuous Infusions: . sodium chloride 50 mL/hr at 08/18/16 0016     LOS: 3 days    Chipper Oman, MD [facility] Pager [phone]  If 7PM-7AM, please contact night-coverage [email or web address] Password [email or web address] 08/20/2016, 1:25 PM

## 2016-08-20 NOTE — Progress Notes (Signed)
Primary team called to discuss about anticoagulation due to DVT. Based on MRI reviewed independently she has large left MCA stroke and higher chance of conversion to hemorrhage with full anticoagulation. We can consider placing an IVC filter for the time being until anticoagulation can safely be initiated.

## 2016-08-21 ENCOUNTER — Inpatient Hospital Stay (HOSPITAL_COMMUNITY): Payer: PPO

## 2016-08-21 DIAGNOSIS — M5010 Cervical disc disorder with radiculopathy, unspecified cervical region: Secondary | ICD-10-CM

## 2016-08-21 DIAGNOSIS — I82409 Acute embolism and thrombosis of unspecified deep veins of unspecified lower extremity: Secondary | ICD-10-CM

## 2016-08-21 MED ORDER — LORAZEPAM 2 MG/ML IJ SOLN
0.5000 mg | Freq: Once | INTRAMUSCULAR | Status: AC
  Administered 2016-08-21: 0.5 mg via INTRAMUSCULAR
  Filled 2016-08-21: qty 1

## 2016-08-21 MED ORDER — GABAPENTIN 100 MG PO CAPS
100.0000 mg | ORAL_CAPSULE | Freq: Three times a day (TID) | ORAL | Status: DC
  Administered 2016-08-21 – 2016-08-22 (×4): 100 mg via ORAL
  Filled 2016-08-21 (×4): qty 1

## 2016-08-21 NOTE — Progress Notes (Signed)
Pt just had a loop recorder placed. Want to double check with [employer] to ensure no data is erased. We just had a pt recently that had just had a device placed that they wanted to interrogate prior to being scanned. MRI Dept will contact Melrose Nakayama or Sharee Pimple the reps for [employer] that cover cone in am.

## 2016-08-21 NOTE — Progress Notes (Signed)
STROKE TEAM PROGRESS NOTE   SUBJECTIVE (INTERVAL HISTORY) Pt daughter is at bedside. Pt Has no complaints overnight.  LE venous Doppler showed LLE DVT. Due to moderate-sized left MCA infarct, will consider IVC filter at this time. TCD bubble study pending.   OBJECTIVE Temp:  [97.3 F (36.3 C)-98.4 F (36.9 C)] 97.8 F (36.6 C) (01/14 0538) Pulse Rate:  [87-92] 88 (01/14 0538) Cardiac Rhythm: Normal sinus rhythm (01/14 0700) Resp:  [18-20] 18 (01/14 0538) BP: (139-181)/(50-86) 154/82 (01/14 0538) SpO2:  [93 %-98 %] 93 % (01/14 1025)  CBC:   Recent Labs Lab 08/17/16 1752 08/19/16 0339  WBC 7.3 5.3  NEUTROABS 5.8 3.2  HGB 11.6* 10.1*  HCT 34.9* 31.5*  MCV 91.4 92.4  PLT 190 867    Basic Metabolic Panel:   Recent Labs Lab 08/17/16 1752 08/19/16 0339  NA 138 141  K 4.0 3.6  CL 104 106  CO2 23 26  GLUCOSE 93 103*  BUN 13 9  CREATININE 1.04* 0.99  CALCIUM 10.0 8.7*    Lipid Panel:     Component Value Date/Time   CHOL 222 (H) 08/18/2016 0333   CHOL 203 (H) 07/06/2016 0946   TRIG 100 08/18/2016 0333   HDL 103 08/18/2016 0333   HDL 112 07/06/2016 0946   CHOLHDL 2.2 08/18/2016 0333   VLDL 20 08/18/2016 0333   LDLCALC 99 08/18/2016 0333   LDLCALC 77 07/06/2016 0946   HgbA1c: No results found for: HGBA1C Urine Drug Screen:     Component Value Date/Time   LABOPIA NONE DETECTED 08/17/2016 1634   COCAINSCRNUR NONE DETECTED 08/17/2016 1634   LABBENZ NONE DETECTED 08/17/2016 1634   AMPHETMU NONE DETECTED 08/17/2016 1634   THCU NONE DETECTED 08/17/2016 1634   LABBARB NONE DETECTED 08/17/2016 1634     IMAGING I have personally reviewed the radiological images below and agree with the radiology interpretations.  Ct Head Wo Contrast 08/17/2016 Acute infarction of left temporal and parietal cortex is noted. Sphenoid sinusitis.   Mr Brain Wo Contrast Mr Jodene Nam Head/brain Wo Cm 08/18/2016 1. Acute infarct within the posterior left MCA distribution. No hemorrhage or  mass effect.  2. Multifocal moderate stenosis within the bilateral MCA M2 distributions. No proximal intracranial arterial occlusion.   Carotid Doppler   There is 1-39% bilateral ICA stenosis. Vertebral artery flow is antegrade.    TEE Aborted. Unable to pass probe  2D Echocardiogram  - Left ventricle: The cavity size was normal. Wall thickness was normal. Systolic function was normal. The estimated ejection fraction was in the range of 55% to 60%. Doppler parameters are consistent with abnormal left ventricular relaxation (grade 1 diastolic dysfunction). - Atrial septum: No defect or patent foramen ovale was identified. - Pulmonary arteries: PA peak pressure: 36 mm Hg (S).   TCD bubble study pending  LE venous Doppler 08/20/2016 Summary: Findings consistent with subacute deep vein thrombosis involving the proximal popliteal vein of the LLE. 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 Incidental findings are consistent with: Baker&'s Cyst on the right.   PHYSICAL EXAM  Temp:  [97.3 F (36.3 C)-98.4 F (36.9 C)] 97.8 F (36.6 C) (01/14 0538) Pulse Rate:  [87-92] 88 (01/14 0538) Resp:  [18-20] 18 (01/14 0538) BP: (139-181)/(50-86) 154/82 (01/14 0538) SpO2:  [93 %-98 %] 93 % (01/14 1025)  General - Well nourished, well developed, in no apparent distress.  Ophthalmologic - Fundi not visualized due to noncooperation.  Cardiovascular -  Regular rate and rhythm.  Mental Status -  Awake, alert, difficulty with orientation questions due to aphasia Expressive aphasia, not able to name or repeat. Able to follow simple commands. No neglect  Cranial Nerves II - XII - II - Visual field intact OU. III, IV, VI - Extraocular movements intact. V - Facial sensation intact bilaterally. VII - Facial movement intact bilaterally. VIII - Hearing & vestibular intact bilaterally. X - Palate elevates symmetrically. XI - Chin  turning & shoulder shrug intact bilaterally. XII - Tongue protrusion intact.  Motor Strength - The patient's strength was normal in all extremities and pronator drift was absent.  Bulk was normal and fasciculations were absent.   Motor Tone - Muscle tone was assessed at the neck and appendages and was normal.  Reflexes - The patient's reflexes were 1+ in all extremities and she had no pathological reflexes.  Sensory - Light touch, temperature/pinprick were assessed and were symmetrical.    Coordination - The patient had normal movements in the hands with no ataxia or dysmetria.  Tremor was absent.  Gait and Station - deferred.   ASSESSMENT/PLAN [name] is a 81 y.o. female with history of COPD presenting with aphasia and field cut. She did not receive IV t-PA due to delay in arrival.   Stroke:  left posterior MCA infarct, embolic pattern, source unclear  Resultant  expressive aphasia  MRI  left posterior MCA infarct  MRA  moderate stenosis left M2, poorly visualized bilateral vessels  Carotid Doppler  No significant stenosis   2D Echo  EF 55-60%. No source of embolus   TEE aborted. Could not pass probe.   LE venous Doppler - subacute DVT involving the proximal popliteal vein of the LLE.  TCD w/ bubble pending  Loop recorder placed 08/19/2016 ([employer])  LDL 99  HgbA1c - pending  Lovenox 40 mg sq daily for VTE prophylaxis Diet Heart Room service appropriate? Yes; Fluid consistency: Thin  aspirin 81 mg daily prior to admission, now on Plavix 75 mg daily.  Patient counseled to be compliant with her antithrombotic medications  Ongoing aggressive stroke risk factor management  Therapy recommendations:  SNF  Disposition:  pending   Followed by Dr. Melrose Nakayama, neurologist in [place] for memory loss - super mild, 30/30 on MMSE  LLE DVT  LE venous Doppler showed LLE subacute DVT  Not anticoagulation candidate at this time due to moderate size of recent  left MCA infarct  May consider IVC filter to avoid further stroke or PE  May consider to start anticoagulation 7 days post stroke. He anticoagulation is started, Plavix can be stopped.  TCD bubble study pending  Hypertension  Stable  Permissive hypertension (OK if < 220/120) but gradually normalize in 5-7 days  Long-term BP goal normotensive  Hyperlipidemia  Home meds:  pravachol 20, resumed in hospital  LDL 99, goal < 70  Now on Lipitor 40 mg daily  Continue statin at discharge  Other Stroke Risk Factors  Advanced age  Coronary artery disease s/p stent, angioplasty  Other Active Problems  Elevated troponin secondary to demand ischemia  COPD asymptomatic  Hospital day # 4  Rosalin Hawking, MD PhD Stroke Neurology 08/21/2016 5:33 PM    To contact Stroke Continuity provider, please refer to [email or web address] After hours, contact General Neurology

## 2016-08-21 NOTE — Progress Notes (Signed)
PROGRESS NOTE [facility]   Jhordyn Hoopingarner   DVV:[id number] DOB: [birth year]  DOA: 08/17/2016 PCP: Vernie Murders, PA   Brief Narrative:  81 year old female with medical history significant for CAD and COPD not on home oxygen presented to the ED with aphasia and left-sided weakness, CT head showed acute infarct in the left temporal and parietal cortex. Patient admitted for further evaluation, neurology was consulted TPA was not given due to delay in arrival.  Subjective: Aphasia continues to improves, patient now c/o numb sensation at b/l UE that come from the neck down, mainly when move neck to the left.   Assessment & Plan: Acute CVA - large left posterior MCA infarct Expressive aphasia and loss of right visual field  MRI/MRA - shows MCA infarct/moderate stenosis versus clot left M2 Carotid Doppler no significant stenosis Aspirin 325 daily ECHO no evidence of thrombus, EF 55-65% G1DD PT recommending CIR Neurology recommendations appreciated - LE doppler - shows DVT  LDL mild elevated will increase statin prior to d/c  Neurology recommended TCD with bubble study - schedule for monday  Hypertension - satble  Allow permissive hypertension Monitor BP Treat BP only if > 220/120 Patient not taking chronic BP medications  Elevated troponin Likely secondary to demand ischemia, no EKG changes,  now back to normal  COPD - asymptomatic at this time Continue home medications - DuoNeb and Dulera  DVT - subacute involving the proximal pop vein of the LLE  Given patient recent large stroke, a/c not recommended -> high risk of conversion  IR consulted for IVC filter placement - for now until a/c can be safely be initiated   Radiculopathy  MRI cervical neck  Will add gabapentin 100 mg TID  May need EMG studies as outpatient   DVT prophylaxis: Lovenox Code Status: Full Family Communication: Family at bedside Disposition Plan: SNF - awaiting approval    Consultants:     Neurology   Procedures:   ECHO - Pending   Antimicrobials:  None   Objective: Vitals:   08/21/16 1025 08/21/16 1100 08/21/16 1459 08/21/16 1511  BP:  (!) 149/79 (!) 154/75   Pulse:  74 84   Resp:  20 18   Temp:  98.3 F (36.8 C) 98.5 F (36.9 C)   TempSrc:  Oral Oral   SpO2: 93% 94% 97% 95%  Weight:      Height:       No intake or output data in the 24 hours ending 08/21/16 1627 Filed Weights   08/17/16 1542 08/17/16 2140  Weight: 63.5 kg (140 lb) 58.2 kg (128 lb 3.2 oz)    Examination:   General exam: out of bed to chair. Neck: Pivot positive with shooting pain throughout left arm  Respiratory system: CTA b/l no wheezing Cardiovascular system: S1S2 no murmurs  Central nervous system: Alert and oriented. Expressive aphasia, mild right site weakness upper > lower, good ROM Extremities: No pedal edema. Symmetric, strength 4/5 L > R    Data Reviewed: I have personally reviewed following labs and imaging studies  CBC:  Recent Labs Lab 08/17/16 1752 08/19/16 0339  WBC 7.3 5.3  NEUTROABS 5.8 3.2  HGB 11.6* 10.1*  HCT 34.9* 31.5*  MCV 91.4 92.4  PLT 190 626   Basic Metabolic Panel:  Recent Labs Lab 08/17/16 1752 08/19/16 0339  NA 138 141  K 4.0 3.6  CL 104 106  CO2 23 26  GLUCOSE 93 103*  BUN 13 9  CREATININE 1.04* 0.99  CALCIUM  10.0 8.7*   GFR: Estimated Creatinine Clearance: 37 mL/min (by C-G formula based on SCr of 0.99 mg/dL). Liver Function Tests:  Recent Labs Lab 08/17/16 1752  AST 20  ALT 19  ALKPHOS 59  BILITOT 0.3  PROT 6.0*  ALBUMIN 3.4*   No results for input(s): LIPASE, AMYLASE in the last 168 hours. No results for input(s): AMMONIA in the last 168 hours. Coagulation Profile:  Recent Labs Lab 08/17/16 1752  INR 0.92   Cardiac Enzymes:  Recent Labs Lab 08/17/16 2210 08/18/16 0333 08/18/16 0945  TROPONINI 0.04* 0.06* <0.03   BNP (last 3 results) No results for input(s): PROBNP in the last 8760  hours. HbA1C: No results for input(s): HGBA1C in the last 72 hours. CBG: No results for input(s): GLUCAP in the last 168 hours. Lipid Profile: No results for input(s): CHOL, HDL, LDLCALC, TRIG, CHOLHDL, LDLDIRECT in the last 72 hours. Thyroid Function Tests: No results for input(s): TSH, T4TOTAL, FREET4, T3FREE, THYROIDAB in the last 72 hours. Anemia Panel: No results for input(s): VITAMINB12, FOLATE, FERRITIN, TIBC, IRON, RETICCTPCT in the last 72 hours. Sepsis Labs: No results for input(s): PROCALCITON, LATICACIDVEN in the last 168 hours.  No results found for this or any previous visit (from the past 240 hour(s)).    Radiology Studies: No results found.  Scheduled Meds: . atorvastatin  40 mg Oral q1800  . clonazePAM  0.5 mg Oral BID  . clopidogrel  75 mg Oral Daily  . enoxaparin (LOVENOX) injection  40 mg Subcutaneous Q24H  . feeding supplement (ENSURE ENLIVE)  237 mL Oral BID BM  . LORazepam  0.5 mg Intramuscular Once  . LORazepam  1 mg Intravenous Once  . mometasone-formoterol  2 puff Inhalation BID  . multivitamin with minerals  1 tablet Oral Daily  . PARoxetine  10 mg Oral QHS   Continuous Infusions:    LOS: 4 days    Chipper Oman, MD [facility] Pager [phone]  If 7PM-7AM, please contact night-coverage [email or web address] Password [facility] 08/21/2016, 4:27 PM

## 2016-08-21 NOTE — Progress Notes (Addendum)
CSw was to prepare d/c for pt per pt's handoff but no discharge summary was signed by the doctor. In addition, there appears to be no follow-up bubble check scheduled for Monday 08/22/16 as needed, per the CSW's handoff.  Alphonse Guild. Jaylyne Breese, LCSWA, LCAS  08/21/16

## 2016-08-22 ENCOUNTER — Inpatient Hospital Stay (HOSPITAL_COMMUNITY): Payer: PPO

## 2016-08-22 ENCOUNTER — Encounter (HOSPITAL_COMMUNITY): Payer: Self-pay | Admitting: Cardiology

## 2016-08-22 ENCOUNTER — Encounter (HOSPITAL_COMMUNITY)
Admission: EM | Disposition: A | Discharge status: Skilled Nursing Facility | Payer: Self-pay | Source: Home / Self Care | Attending: Family Medicine

## 2016-08-22 DIAGNOSIS — I82532 Chronic embolism and thrombosis of left popliteal vein: Secondary | ICD-10-CM | POA: Diagnosis not present

## 2016-08-22 DIAGNOSIS — M5010 Cervical disc disorder with radiculopathy, unspecified cervical region: Secondary | ICD-10-CM | POA: Diagnosis not present

## 2016-08-22 DIAGNOSIS — Z955 Presence of coronary angioplasty implant and graft: Secondary | ICD-10-CM | POA: Diagnosis not present

## 2016-08-22 DIAGNOSIS — F329 Major depressive disorder, single episode, unspecified: Secondary | ICD-10-CM | POA: Diagnosis not present

## 2016-08-22 DIAGNOSIS — M50221 Other cervical disc displacement at C4-C5 level: Secondary | ICD-10-CM | POA: Diagnosis not present

## 2016-08-22 DIAGNOSIS — I82432 Acute embolism and thrombosis of left popliteal vein: Secondary | ICD-10-CM

## 2016-08-22 DIAGNOSIS — H539 Unspecified visual disturbance: Secondary | ICD-10-CM | POA: Diagnosis not present

## 2016-08-22 DIAGNOSIS — R4701 Aphasia: Secondary | ICD-10-CM | POA: Diagnosis not present

## 2016-08-22 DIAGNOSIS — Z9981 Dependence on supplemental oxygen: Secondary | ICD-10-CM | POA: Diagnosis not present

## 2016-08-22 DIAGNOSIS — M1990 Unspecified osteoarthritis, unspecified site: Secondary | ICD-10-CM | POA: Diagnosis not present

## 2016-08-22 DIAGNOSIS — I69398 Other sequelae of cerebral infarction: Secondary | ICD-10-CM | POA: Diagnosis not present

## 2016-08-22 DIAGNOSIS — Q211 Atrial septal defect: Secondary | ICD-10-CM

## 2016-08-22 DIAGNOSIS — I10 Essential (primary) hypertension: Secondary | ICD-10-CM | POA: Diagnosis not present

## 2016-08-22 DIAGNOSIS — R748 Abnormal levels of other serum enzymes: Secondary | ICD-10-CM | POA: Diagnosis not present

## 2016-08-22 DIAGNOSIS — Z7901 Long term (current) use of anticoagulants: Secondary | ICD-10-CM | POA: Diagnosis not present

## 2016-08-22 DIAGNOSIS — I2510 Atherosclerotic heart disease of native coronary artery without angina pectoris: Secondary | ICD-10-CM | POA: Diagnosis not present

## 2016-08-22 DIAGNOSIS — E782 Mixed hyperlipidemia: Secondary | ICD-10-CM

## 2016-08-22 DIAGNOSIS — Q2112 Patent foramen ovale: Secondary | ICD-10-CM

## 2016-08-22 DIAGNOSIS — I639 Cerebral infarction, unspecified: Secondary | ICD-10-CM | POA: Diagnosis not present

## 2016-08-22 DIAGNOSIS — E785 Hyperlipidemia, unspecified: Secondary | ICD-10-CM | POA: Diagnosis not present

## 2016-08-22 DIAGNOSIS — M6281 Muscle weakness (generalized): Secondary | ICD-10-CM | POA: Diagnosis not present

## 2016-08-22 DIAGNOSIS — I6350 Cerebral infarction due to unspecified occlusion or stenosis of unspecified cerebral artery: Secondary | ICD-10-CM | POA: Diagnosis not present

## 2016-08-22 DIAGNOSIS — Z7982 Long term (current) use of aspirin: Secondary | ICD-10-CM | POA: Diagnosis not present

## 2016-08-22 DIAGNOSIS — F419 Anxiety disorder, unspecified: Secondary | ICD-10-CM | POA: Diagnosis not present

## 2016-08-22 DIAGNOSIS — M50222 Other cervical disc displacement at C5-C6 level: Secondary | ICD-10-CM | POA: Diagnosis not present

## 2016-08-22 DIAGNOSIS — J449 Chronic obstructive pulmonary disease, unspecified: Secondary | ICD-10-CM | POA: Diagnosis not present

## 2016-08-22 DIAGNOSIS — I82A29 Chronic embolism and thrombosis of unspecified axillary vein: Secondary | ICD-10-CM | POA: Diagnosis not present

## 2016-08-22 DIAGNOSIS — I69320 Aphasia following cerebral infarction: Secondary | ICD-10-CM | POA: Diagnosis not present

## 2016-08-22 DIAGNOSIS — I69314 Frontal lobe and executive function deficit following cerebral infarction: Secondary | ICD-10-CM | POA: Diagnosis not present

## 2016-08-22 LAB — HEMOGLOBIN A1C
Hgb A1c MFr Bld: 5.7 % — ABNORMAL HIGH (ref 4.8–5.6)
MEAN PLASMA GLUCOSE: 117 mg/dL

## 2016-08-22 SURGERY — ECHOCARDIOGRAM, TRANSESOPHAGEAL
Anesthesia: Monitor Anesthesia Care

## 2016-08-22 MED ORDER — ATORVASTATIN CALCIUM 40 MG PO TABS: 40.0000 mg | ORAL_TABLET | Freq: Every day | ORAL | 0 refills | Status: DC

## 2016-08-22 MED ORDER — APIXABAN 5 MG PO TABS: 5.0000 mg | ORAL_TABLET | Freq: Two times a day (BID) | ORAL | Status: DC

## 2016-08-22 MED ORDER — APIXABAN 5 MG PO TABS: 10.0000 mg | ORAL_TABLET | Freq: Two times a day (BID) | ORAL | 0 refills | Status: DC

## 2016-08-22 MED ORDER — LORAZEPAM 2 MG/ML IJ SOLN
0.5000 mg | Freq: Once | INTRAMUSCULAR | Status: AC
  Administered 2016-08-22: 0.5 mg via INTRAMUSCULAR
  Filled 2016-08-22: qty 1

## 2016-08-22 MED ORDER — GABAPENTIN 100 MG PO CAPS: 100.0000 mg | ORAL_CAPSULE | Freq: Three times a day (TID) | ORAL | 0 refills | Status: DC

## 2016-08-22 MED ORDER — APIXABAN 5 MG PO TABS
10.0000 mg | ORAL_TABLET | Freq: Two times a day (BID) | ORAL | Status: DC
  Administered 2016-08-22: 10 mg via ORAL
  Filled 2016-08-22: qty 2

## 2016-08-22 MED ORDER — APIXABAN 5 MG PO TABS: 5.0000 mg | ORAL_TABLET | Freq: Two times a day (BID) | ORAL | 0 refills | Status: DC

## 2016-08-22 NOTE — Evaluation (Signed)
Speech Language Pathology Evaluation Patient Details Name: Stephanie [name] MRN: [id number] DOB: 1936-04-26 Today's Date: 08/22/2016 Time: 0865-7846 SLP Time Calculation (min) (ACUTE ONLY): 28 min  Problem List:  Patient Active Problem List   Diagnosis Date Noted  . DVT (deep venous thrombosis) ([place])   . Cerebrovascular accident (CVA) due to embolism of left middle cerebral artery ([place])   . Expressive aphasia   . Elevated troponin 08/18/2016  . Mild malnutrition ([place]) 08/18/2016  . Acute CVA (cerebrovascular accident) ([place]) 08/17/2016  . Acute exacerbation of chronic obstructive pulmonary disease (COPD) ([place]) 07/19/2016  . Depression 05/03/2016  . Mild dementia 04/19/2016  . COPD, moderate ([place]) 03/05/2015  . Anxiety and depression 03/04/2015  . Arteriosclerosis of coronary artery 03/04/2015  . CAFL (chronic airflow limitation) ([place]) 03/04/2015  . Breathlessness on exertion 03/04/2015  . HLD (hyperlipidemia) 03/04/2015  . Osteopenia 03/04/2015  . Peptic ulcer 03/04/2015  . Amnesia 03/20/2014  . Disordered sleep 03/20/2014  . Allergy to environmental factors 11/14/2013  . Gastroduodenal ulcer 11/14/2013  . GI bleed 11/14/2013   Past Medical History:  Past Medical History:  Diagnosis Date  . Arthritis   . Complication of anesthesia   . COPD (chronic obstructive pulmonary disease) ([place])   . Coronary artery disease 2013   stent  . Depression   . Edema   . Pneumonia    IN PAST  . PONV (postoperative nausea and vomiting)    Past Surgical History:  Past Surgical History:  Procedure Laterality Date  . BALLOON ANGIOPLASTY, ARTERY  05/17/2009  . CATARACT EXTRACTION W/PHACO Left 05/24/2016   Procedure: CATARACT EXTRACTION PHACO AND INTRAOCULAR LENS PLACEMENT (IOC);  Surgeon: Birder Robson, MD;  Location: [facility] ORS;  Service: Ophthalmology;  Laterality: Left;  Lot# [id number] H [place]:   00:52.3 AP%:   27.3 CDE:  14.26   . CATARACT EXTRACTION W/PHACO Right 06/14/2016    Procedure: CATARACT EXTRACTION PHACO AND INTRAOCULAR LENS PLACEMENT (IOC);  Surgeon: Birder Robson, MD;  Location: [facility] ORS;  Service: Ophthalmology;  Laterality: Right;  Lot# [id number] H [place]: 01:13.4 AP%: 24.4 CDE: 17.85  . CORONARY ANGIOPLASTY     STENT  . EP IMPLANTABLE DEVICE N/A 08/19/2016   Procedure: Loop Recorder Insertion;  Surgeon: Will Meredith Leeds, MD;  Location: [facility];  Service: Cardiovascular;  Laterality: N/A;   HPI:  81 year old female with medical history significant for CAD and COPD not on home oxygen presented to the ED on with aphasia and left-sided weakness, CT head showed acute infarct in the left temporal and parietal cortex. Patient admitted for further evaluation, neurology was consulted TPA was not given due to delay in arrival. Further MRI revealed acute infarct within the posterior left MCA distribution, no hemorrhage or mass effect with multifocal moderate stenosis within the bilateral MCA M2 distributions. No proximal intracranial arterial occlusion.On 08/21/16, MD reports aphasia continues to improves, patient now c/o numb sensation at b/l UE that come from the neck down, mainly when move neck to the left.  Of note, pt hospitalized within the last month due to COPD exacerbation, went to SNF rehab has been home for 2 weeks.    Assessment / Plan / Recommendation Clinical Impression  Pt presents with severe expressive aphasia c/b neologistic speech and possible alexia complicated by deficits within her right visaul field. Pt's ability to comprehend information appears intact. Cognitively, she is able to sustain attention and is able to indicated (during some spontaneous utterances) current situation/safety awareness. Pt also demonstrates some s/s of emotional lability.  Skilled ST recommended to target expressive langauge deficits. ST to follow during this hospitalization.     SLP Assessment  Patient needs continued Speech Lanaguage Pathology Services     Follow Up Recommendations  Skilled Nursing facility    Frequency and Duration min 2x/week  2 weeks      SLP Evaluation Cognition  Overall Cognitive Status: Within Functional Limits for tasks assessed (May need further assessment as aphasia treatment progresses) Arousal/Alertness: Awake/alert Orientation Level: Oriented to person;Oriented to place;Oriented to situation (difficult to assess d/t aphasia) Attention: Sustained Sustained Attention: Appears intact       Comprehension  Auditory Comprehension Overall Auditory Comprehension: Appears within functional limits for tasks assessed Yes/No Questions: Within Functional Limits Commands: Within Functional Limits Conversation: Simple EffectiveTechniques: Repetition Environmental consultant Discrimination: Not tested Reading Comprehension Reading Status: Impaired Word level: Impaired Sentence Level: Not tested Paragraph Level: Not tested Functional Environmental (signs, name badge): Impaired Interfering Components:  (Possible right visual field deficits)    Expression Expression Primary Mode of Expression: Verbal Verbal Expression Overall Verbal Expression: Impaired Initiation: No impairment Automatic Speech: Name Level of Generative/Spontaneous Verbalization: Sentence Repetition: Impaired Level of Impairment: Word level Naming: Impairment Responsive: 0-25% accurate Confrontation: Impaired Convergent: 0-24% accurate Divergent: 0-24% accurate Verbal Errors: Neologisms Pragmatics: No impairment Non-Verbal Means of Communication: Not applicable Written Expression Dominant Hand: Right Written Expression: Not tested (d/t right sided weakness)   Oral / Motor  Oral Motor/Sensory Function Overall Oral Motor/Sensory Function: Within functional limits Motor Speech Overall Motor Speech: Appears within functional limits for tasks assessed (Apraxia not formally tested during session) Respiration: Within functional  limits Phonation: Normal Resonance: Within functional limits Articulation: Within functional limitis Intelligibility: Intelligible Motor Planning: Witnin functional limits Motor Speech Errors: Not applicable   GO            Jeralyn Nolden B. [place] Nail, M.S., CCC-SLP Speech-Language Pathologist           Khaleah Duer 08/22/2016, 9:48 AM

## 2016-08-22 NOTE — Progress Notes (Signed)
*  PRELIMINARY RESULTS* Vascular Ultrasound Transcranial Doppler with Bubbles has been completed with Dr. Erlinda Hong. High Intensity Transient Signals (HITS) were heard at rest, suggestive of Spencer grade III-IV, and Spencer grade IV-V with Valsalva maneuver. This is suggestive of a large PFO.  08/22/2016 4:23 PM Maudry Mayhew, BS, RVT, RDCS, RDMS

## 2016-08-22 NOTE — Discharge Instructions (Addendum)
Information on my medicine - ELIQUIS (apixaban)  This medication education was reviewed with me or my healthcare representative as part of my discharge preparation.  The pharmacist that spoke with me during my hospital stay was:  Saundra Shelling, [facility]  Why was Eliquis prescribed for you? Eliquis was prescribed to treat blood clots that may have been found in the veins of your legs (deep vein thrombosis) or in your lungs (pulmonary embolism) and to reduce the risk of them occurring again.  What do You need to know about Eliquis ? The starting dose is 10 mg (two 5 mg tablets) taken TWICE daily for the FIRST SEVEN (7) DAYS, then on (enter date)  08/30/15  the dose is reduced to ONE 5 mg tablet taken TWICE daily.  Eliquis may be taken with or without food.   Try to take the dose about the same time in the morning and in the evening. If you have difficulty swallowing the tablet whole please discuss with your pharmacist how to take the medication safely.  Take Eliquis exactly as prescribed and DO NOT stop taking Eliquis without talking to the doctor who prescribed the medication.  Stopping may increase your risk of developing a new blood clot.  Refill your prescription before you run out.  After discharge, you should have regular check-up appointments with your healthcare provider that is prescribing your Eliquis.    What do you do if you miss a dose? If a dose of ELIQUIS is not taken at the scheduled time, take it as soon as possible on the same day and twice-daily administration should be resumed. The dose should not be doubled to make up for a missed dose.  Important Safety Information A possible side effect of Eliquis is bleeding. You should call your healthcare provider right away if you experience any of the following: ? Bleeding from an injury or your nose that does not stop. ? Unusual colored urine (red or dark brown) or unusual colored stools (red or black). ? Unusual bruising for  unknown reasons. ? A serious fall or if you hit your head (even if there is no bleeding).  Some medicines may interact with Eliquis and might increase your risk of bleeding or clotting while on Eliquis. To help avoid this, consult your healthcare provider or pharmacist prior to using any new prescription or non-prescription medications, including herbals, vitamins, non-steroidal anti-inflammatory drugs (NSAIDs) and supplements.  This website has more information on Eliquis (apixaban): [email or web address]://[email or web address].com/[email or web address]

## 2016-08-22 NOTE — Progress Notes (Signed)
ANTICOAGULATION CONSULT NOTE - Initial Consult  Pharmacy Consult:  Eliquis Indication:  New LLE DVT  Allergies  Allergen Reactions  . Duloxetine Hcl     unknown  . Influenza Vaccines     unknown  . Other     STEROIDS  DON'T WORK FOR HER  . Venlafaxine     GI distress    Patient Measurements: Height: '5\' 2"'$  (157.5 cm) Weight: 128 lb 3.2 oz (58.2 kg) IBW/kg (Calculated) : 50.1  Vital Signs: Temp: 98.7 F (37.1 C) (01/15 0609) Temp Source: Oral (01/15 0609) BP: 124/68 (01/15 1157) Pulse Rate: 87 (01/15 1157)  Labs: No results for input(s): HGB, HCT, PLT, APTT, LABPROT, INR, HEPARINUNFRC, HEPRLOWMOCWT, CREATININE, CKTOTAL, CKMB, TROPONINI in the last 72 hours.  Estimated Creatinine Clearance: 37 mL/min (by C-G formula based on SCr of 0.99 mg/dL).   Medical History: Past Medical History:  Diagnosis Date  . Arthritis   . Complication of anesthesia   . COPD (chronic obstructive pulmonary disease) ([place])   . Coronary artery disease 2013   stent  . Depression   . Edema   . Pneumonia    IN PAST  . PONV (postoperative nausea and vomiting)       Assessment: Stephanie [name] presented with aphagia, found to have a new CVA.  Unable to perform TEE because of agitation and Bubble study to r/o PFO is pending.  Doppler is positive for a new LLE DVT.  The initial plan was to place an IVC filter due to concern of hemorrhagic transformation on anticoagulation; however, it is now 5 days out of CVA and MD feels comfortable starting anticoagulation.  Pharmacy consulted to initiate Eliquis.  Baseline labs and home meds reviewed.  Noted patient received prophylactic Lovenox last night.   Goal of Therapy:  Appropriate anticoagulation    Plan:  - D/C Lovenox - Eliquis '10mg'$  PO BID x 7 days, then on 08/30/15 start '5mg'$  PO BID - Pharmacy will sign off and follow peripherally.  Thank you for the consult!    Kiandria Clum D. Mina Marble, PharmD, BCPS Pager:  [phone] 08/22/2016, 2:18 PM

## 2016-08-22 NOTE — Progress Notes (Signed)
[facility]   Patient seen and examined, patient doing well, Patient had MRI neck done show some cervical stenosis. Case discussed with Neurosurgery which recommended follow up in 3-4 week. Patient to have a bubble study done today. If no further complication will discharge after test is completed. See discharge summary for details   Case discussed with neurology, patient was scheduled for IVC, but given stroke was 5 days ago, neurology feels comfortable to start a/c. I discussed with the family different options of a/c, given risk and benefits. Will will start Eliquis BID   Discharge summary to follow.  Chipper Oman, MD

## 2016-08-22 NOTE — Care Management Important Message (Signed)
Important Message  Patient Details  Name: Stephanie [name] MRN: [id number] Date of Birth: 1936/02/04   Medicare Important Message Given:  Yes    Stephanie [name] Montine Circle 08/22/2016, 4:44 PM

## 2016-08-22 NOTE — Clinical Social Work Note (Signed)
Clinical Social Worker notified patient is ready to DC, CSW facilitated patient discharge including contacting patient family and facility to confirm patient discharge plans and room availability. Per Leslie @ [facility] summary has to be received by 345PM today otherwise pt has to come tomorrow. Clinical information will be faxed to facility once available. CSW informed MD about deadline. Pt/family agreeable with plan. RN to call report prior to discharge. Family will transport pt; CSW spoke to dtr Butch Penny who reports she will be transporting pt to the facility.  Clinical Social Worker will sign off for now as social work intervention is no longer needed. Please consult [place] again if new need arises.  Kyerra Vargo B. Joline Maxcy Clinical Social Work Dept Weekend Social Worker [phone] 12:14 PM

## 2016-08-22 NOTE — Progress Notes (Addendum)
STROKE TEAM PROGRESS NOTE   SUBJECTIVE (INTERVAL HISTORY) Pt is see at vascular lab. Pt complaints of neck pain overnight, MRI C-spine unremarkable. TCD bubble study showed large PFO. Today is day 5 and IVC filter planned for today. Discussed with family and Dr. Quincy Simmonds that we can start eliquis today so that IVC filter will not needed. Family agrees with plan   OBJECTIVE Temp:  [97.8 F (36.6 C)-98.7 F (37.1 C)] 98.7 F (37.1 C) (01/15 0609) Pulse Rate:  [83-88] 87 (01/15 1157) Cardiac Rhythm: Normal sinus rhythm (01/15 0700) Resp:  [18] 18 (01/15 1157) BP: (124-143)/(55-68) 124/68 (01/15 1157) SpO2:  [96 %-98 %] 98 % (01/15 1157)  CBC:   Recent Labs Lab 08/17/16 1752 08/19/16 0339  WBC 7.3 5.3  NEUTROABS 5.8 3.2  HGB 11.6* 10.1*  HCT 34.9* 31.5*  MCV 91.4 92.4  PLT 190 416    Basic Metabolic Panel:   Recent Labs Lab 08/17/16 1752 08/19/16 0339  NA 138 141  K 4.0 3.6  CL 104 106  CO2 23 26  GLUCOSE 93 103*  BUN 13 9  CREATININE 1.04* 0.99  CALCIUM 10.0 8.7*    Lipid Panel:     Component Value Date/Time   CHOL 222 (H) 08/18/2016 0333   CHOL 203 (H) 07/06/2016 0946   TRIG 100 08/18/2016 0333   HDL 103 08/18/2016 0333   HDL 112 07/06/2016 0946   CHOLHDL 2.2 08/18/2016 0333   VLDL 20 08/18/2016 0333   LDLCALC 99 08/18/2016 0333   LDLCALC 77 07/06/2016 0946   HgbA1c:  Lab Results  Component Value Date   HGBA1C 5.7 (H) 08/21/2016   Urine Drug Screen:     Component Value Date/Time   LABOPIA NONE DETECTED 08/17/2016 1634   COCAINSCRNUR NONE DETECTED 08/17/2016 1634   LABBENZ NONE DETECTED 08/17/2016 1634   AMPHETMU NONE DETECTED 08/17/2016 1634   THCU NONE DETECTED 08/17/2016 1634   LABBARB NONE DETECTED 08/17/2016 1634     IMAGING I have personally reviewed the radiological images below and agree with the radiology interpretations.  Ct Head Wo Contrast 08/17/2016 Acute infarction of left temporal and parietal cortex is noted. Sphenoid  sinusitis.   Mr Brain Wo Contrast Mr Jodene Nam Head/brain Wo Cm 08/18/2016 1. Acute infarct within the posterior left MCA distribution. No hemorrhage or mass effect.  2. Multifocal moderate stenosis within the bilateral MCA M2 distributions. No proximal intracranial arterial occlusion.   Carotid Doppler   There is 1-39% bilateral ICA stenosis. Vertebral artery flow is antegrade.    TEE Aborted. Unable to pass probe  2D Echocardiogram  - Left ventricle: The cavity size was normal. Wall thickness was normal. Systolic function was normal. The estimated ejection fraction was in the range of 55% to 60%. Doppler parameters are consistent with abnormal left ventricular relaxation (grade 1 diastolic dysfunction). - Atrial septum: No defect or patent foramen ovale was identified. - Pulmonary arteries: PA peak pressure: 36 mm Hg (S).   TCD bubble study - High Intensity Transient Signals (HITS) were heard at rest, suggestive of Spencer grade III-IV, and Spencer grade IV-V with Valsalva maneuver. This is suggestive of a large PFO.  LE venous Doppler 08/20/2016 Summary: Findings consistent with subacute deep vein thrombosis involving the proximal popliteal vein of the LLE. 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 Incidental findings are consistent with: Baker&'s Cyst on the right.  Mr Cervical Spine Wo Contrast 08/22/2016 IMPRESSION: 1. Multilevel facet  arthropathy and uncovertebral ridging. 2. No significant spinal stenosis. 3. Moderate to advanced left foraminal narrowing at C5-6 and C6-7    PHYSICAL EXAM  Temp:  [97.8 F (36.6 C)-98.7 F (37.1 C)] 98.7 F (37.1 C) (01/15 0609) Pulse Rate:  [83-88] 87 (01/15 1157) Resp:  [18] 18 (01/15 1157) BP: (124-143)/(55-68) 124/68 (01/15 1157) SpO2:  [96 %-98 %] 98 % (01/15 1157)  General - Well nourished, well developed, in no apparent distress.  Ophthalmologic - Fundi not  visualized due to noncooperation.  Cardiovascular - Regular rate and rhythm.  Mental Status -  Awake, alert, difficulty with orientation questions due to aphasia Expressive aphasia, improving, not able to name or repeat. Able to follow simple commands. No neglect  Cranial Nerves II - XII - II - Visual field intact OU. III, IV, VI - Extraocular movements intact. V - Facial sensation intact bilaterally. VII - Facial movement intact bilaterally. VIII - Hearing & vestibular intact bilaterally. X - Palate elevates symmetrically. XI - Chin turning & shoulder shrug intact bilaterally. XII - Tongue protrusion intact.  Motor Strength - The patient's strength was normal in all extremities and pronator drift was absent.  Bulk was normal and fasciculations were absent.   Motor Tone - Muscle tone was assessed at the neck and appendages and was normal.  Reflexes - The patient's reflexes were 1+ in all extremities and she had no pathological reflexes.  Sensory - Light touch, temperature/pinprick were assessed and were symmetrical.    Coordination - The patient had normal movements in the hands with no ataxia or dysmetria.  Tremor was absent.  Gait and Station - deferred.   ASSESSMENT/PLAN [name] is a 81 y.o. female with history of COPD presenting with aphasia and field cut. She did not receive IV t-PA due to delay in arrival.   Stroke:  left posterior MCA infarct, embolic pattern, likely due to DVT in the setting of large PFO.   Resultant  expressive aphasia  MRI  left posterior MCA infarct  MRA  moderate stenosis left M2, poorly visualized bilateral vessels  Carotid Doppler  No significant stenosis   2D Echo  EF 55-60%. No source of embolus   TEE aborted. Could not pass probe.   LE venous Doppler - subacute DVT involving the proximal popliteal vein of the LLE.  TCD w/ bubble suggests a large PFO  Loop recorder placed 08/19/2016 ([employer])  LDL 99  HgbA1c  5.7  Lovenox 40 mg sq daily for VTE prophylaxis Diet Heart Room service appropriate? Yes; Fluid consistency: Thin Diet - low sodium heart healthy  aspirin 81 mg daily prior to admission, now on Plavix 75 mg daily. Recommend to start eliquis today for DVT treatment as pt has been 5 days out of stroke.   Patient counseled to be compliant with her antithrombotic medications  Ongoing aggressive stroke risk factor management  Therapy recommendations:  SNF  Disposition:  pending   Followed by Dr. Melrose Nakayama, neurologist in [place] for memory loss - super mild, 30/30 on MMSE  LLE DVT  LE venous Doppler showed LLE subacute DVT  Now day 5 post stroke, recommend to start anticoagulation for treatment  TCD bubble study showed large PFO  IVC filter cancelled  Close neuro check, stat head CT if neuro changes.  Hypertension  Stable  Permissive hypertension (OK if < 180/105) but gradually normalize in 5-7 days  Long-term BP goal normotensive  Hyperlipidemia  Home meds:  pravachol 20, resumed in hospital  LDL 99, goal < 70  Now on Lipitor 40 mg daily  Continue statin at discharge  Other Stroke Risk Factors  Advanced age  Coronary artery disease s/p stent, angioplasty  Other Active Problems  Elevated troponin secondary to demand ischemia  COPD asymptomatic  Hospital day # 5  Neurology will sign off. Please call with questions. Pt will follow up with Dr. Leonie Man at [facility] in about 6 weeks. Thanks for the consult.   Rosalin Hawking, MD PhD Stroke Neurology 08/22/2016 9:43 PM    To contact Stroke Continuity provider, please refer to [email or web address] After hours, contact General Neurology

## 2016-08-22 NOTE — Discharge Summary (Signed)
Physician Discharge Summary  Stephanie [name]  NGE:[id number]  DOB: Mar 28, 1936  DOA: 08/17/2016 PCP: Vernie Murders, PA  Admit date: 08/17/2016 Discharge date: 08/22/2016  Admitted From: Home  Disposition:  SNF   Recommendations for Outpatient Follow-up:  1. Follow up with PCP in 1-2 weeks 2. Please obtain BMP/CBC in one week 3. Please follow up on the following pending results: Final TCD bubble studies    Discharge Condition: Improved   CODE STATUS: DNR Diet recommendation: Heart Healthy   Brief/Interim Summary: 81 year old female with medical history significant for CAD and COPD not on home oxygen presented to the ED with aphasia and left-sided weakness, CT head showed acute infarct in the left temporal and parietal cortex. Patient admitted for further evaluation, neurology was consulted TPA was not given due to delay in arrival. During hospital stay patient was schedule for TEE, but did not tolerated the procedure. So TCD bubble study was done which results are pending. Patient was also found to have a DVT at the proximal popliteal LLE. Patient was planned to have an IVC filter but after discussion with neurology, was felt safe that patient to be started on a/c. Patient started on Eliquis. Patient was evaluated by physical therapy and was recommended short therm rehab.   Discharge Diagnoses/Hospital Course:  Acute CVA - large left posterior MCA infarct - 2/2 DVT and large PFO  Expressive aphasia and loss of right visual field  MRI/MRA - shows MCA infarct/moderate stenosis versus clot left M2 Carotid Doppler no significant stenosis ECHO no evidence of thrombus, EF 55-65% G1DD Implantable loop recorder was placed on 08/19/16 Patient initially started in ASA and Plavix - switch to NOAC due to DVT  Neurology recommendations appreciated - LE doppler - shows DVT  LDL mild elevated continue Lipitor 40 mg  TCD bubble study done today - prelim results shows large PFO  Hypertension -  BP has stabilized   Allowed permissive hypertension, now BP stabilized  Patient not taking chronic BP medications  Elevated troponin Likely secondary to demand ischemia, no EKG changes,  now back to normal No further ischemic workup recommended   COPD - asymptomatic at this time Continue home medications - DuoNeb and Dulera  DVT - subacute involving the proximal pop vein of the LLE  Given patient recent large stroke, a/c not recommended -> high risk of conversion  IR consulted for IVC filter placement - subsequently discontinued   Radiculopathy  MRI cervical neck shows some degree of stenosis - case discussed with neurosurgery - patient will need to follow up in 3-4 weeks as outpatient after rehab. With Dr Cyndy Freeze. Continue gabapentin 100 mg TID  May need EMG studies as outpatient    Discharge Instructions  You were cared for by a hospitalist during your hospital stay. If you have any questions about your discharge medications or the care you received while you were in the hospital after you are discharged, you can call the unit and asked to speak with the hospitalist on call if the hospitalist that took care of you is not available. Once you are discharged, your primary care physician will handle any further medical issues. Please note that NO REFILLS for any discharge medications will be authorized once you are discharged, as it is imperative that you return to your primary care physician (or establish a relationship with a primary care physician if you do not have one) for your aftercare needs so that they can reassess your need for medications and monitor your lab  values.  Discharge Instructions    Ambulatory referral to Neurology    Complete by:  As directed    Stroke patient. Dr. Leonie Man prefers follow up in 6 weeks     Allergies as of 08/22/2016      Reactions   Duloxetine Hcl    unknown   Influenza Vaccines    unknown   Other    STEROIDS  DON'T WORK FOR HER   Venlafaxine     GI distress      Medication List    STOP taking these medications   aspirin 325 MG EC tablet   pravastatin 20 MG tablet Commonly known as:  PRAVACHOL     TAKE these medications   albuterol 108 (90 Base) MCG/ACT inhaler Commonly known as:  VENTOLIN HFA Inhale 2 puffs into the lungs every 6 (six) hours as needed.   apixaban 5 MG Tabs tablet Commonly known as:  ELIQUIS Take 2 tablets (10 mg total) by mouth 2 (two) times daily.   apixaban 5 MG Tabs tablet Commonly known as:  ELIQUIS Take 1 tablet (5 mg total) by mouth 2 (two) times daily. Start taking on:  08/29/2016   atorvastatin 40 MG tablet Commonly known as:  LIPITOR Take 1 tablet (40 mg total) by mouth daily at 6 PM.   bisacodyl 5 MG EC tablet Commonly known as:  DULCOLAX Take 1 tablet (5 mg total) by mouth daily as needed for moderate constipation.   budesonide-formoterol 160-4.5 MCG/ACT inhaler Commonly known as:  SYMBICORT Inhale 2 puffs into the lungs 2 (two) times daily.   CALCIUM 600+D 600-800 MG-UNIT Tabs Generic drug:  Calcium Carb-Cholecalciferol Take 2 tablets by mouth daily.   clonazePAM 0.5 MG tablet Commonly known as:  KLONOPIN Take 1 tablet (0.5 mg total) by mouth 2 (two) times daily.   docusate sodium 100 MG capsule Commonly known as:  COLACE Take 200 mg by mouth at bedtime.   gabapentin 100 MG capsule Commonly known as:  NEURONTIN Take 1 capsule (100 mg total) by mouth 3 (three) times daily.   ibuprofen 200 MG tablet Commonly known as:  ADVIL,MOTRIN Take 400 mg by mouth daily as needed for headache or moderate pain.   PARoxetine 10 MG tablet Commonly known as:  PAXIL TAKE TWO TABLETS BY MOUTH AT BEDTIME DAILY What changed:  See the new instructions.   senna-docusate 8.6-50 MG tablet Commonly known as:  Senokot-S Take 1 tablet by mouth at bedtime as needed for mild constipation.   tiotropium 18 MCG inhalation capsule Commonly known as:  SPIRIVA HANDIHALER Place 1 capsule (18 mcg  total) into inhaler and inhale daily.   TYLENOL 500 MG tablet Generic drug:  acetaminophen Take 1 tablet by mouth at bedtime.      Follow-up Information    SETHI,PRAMOD, MD Follow up in 6 week(s).   Specialties:  Neurology, Radiology Why:  stroke clinic. office will call with appointment date and time Contact information: [place] [address] [phone]        [place] [facility] Follow up on 09/01/2016.   Specialty:  Cardiology Why:  10:00AM, wound check Contact information: [address] [place]       Kevan Ny Ditty, MD. Schedule an appointment as soon as possible for a visit in 3 week(s).   Specialty:  Neurosurgery Contact information: [address] [place] [address] [phone]  Allergies  Allergen Reactions  . Duloxetine Hcl     unknown  . Influenza Vaccines     unknown  . Other     STEROIDS  DON'T WORK FOR HER  . Venlafaxine     GI distress    Consultations:  Neurology    Procedures/Studies: Ct Head Wo Contrast  Result Date: 08/17/2016 CLINICAL DATA:  Aphasia, right-sided weakness. EXAM: CT HEAD WITHOUT CONTRAST TECHNIQUE: Contiguous axial images were obtained from the base of the skull through the vertex without intravenous contrast. COMPARISON:  None. FINDINGS: Brain: Low density and sulcal effacement is seen involving the left temporal and parietal cortex consistent with acute infarction. No hemorrhage is noted. Probable old left cerebellar infarction is noted. No midline shift is noted. Vascular: Atherosclerosis of carotid siphons is noted. Skull: Normal. Negative for fracture or focal lesion. Sinuses/Orbits: Sphenoid sinusitis is noted. Other: None. 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 Sphenoid sinusitis. Electronically Signed   By: Marijo Conception, M.D.   On: 08/17/2016 16:41   Mr  Brain Wo Contrast  Result Date: 08/18/2016 CLINICAL DATA:  Possible CVA. Right facial droop. Right arm and leg weakness. EXAM: MRI HEAD WITHOUT CONTRAST MRA HEAD WITHOUT CONTRAST TECHNIQUE: Multiplanar, multiecho pulse sequences of the brain and surrounding structures were obtained without intravenous contrast. Angiographic images of the head were obtained using MRA technique without contrast. COMPARISON:  Head CT 08/17/2016 FINDINGS: MRI HEAD FINDINGS Brain: There is diffusion restriction within the posterior left MCA distribution, involving the posterior left frontal lobe, the left insular cortex and the left parietal and temporal lobes. There is no associated hemorrhage. There is hyperintense T2 weighted signal within the corresponding distribution of the ischemia. There is otherwise mild periventricular T2 hyperintensity, but no other focal parenchymal signal abnormality. No mass lesion or midline shift. No hydrocephalus or extra-axial fluid collection. The midline structures are normal. No age advanced or lobar predominant atrophy. Vascular: Major intracranial arterial and venous sinus flow voids are preserved. No evidence of chronic microhemorrhage or amyloid angiopathy. Skull and upper cervical spine: The visualized skull base, calvarium, upper cervical spine and extracranial soft tissues are normal. Sinuses/Orbits: There is fluid within the left sphenoid sinus. Normal orbits. MRA HEAD FINDINGS Intracranial internal carotid arteries: Normal. Anterior cerebral arteries: Normal. Middle cerebral arteries: There is multifocal moderate narrowing of both middle cerebral artery M2 segments. Posterior communicating arteries: Present bilaterally. Posterior cerebral arteries: Bilateral fetal origins, but otherwise normal. Basilar artery: Normal. Vertebral arteries: Left dominant. The right vertebral artery is very diminutive and may terminate in the posterior inferior cerebellar artery. Superior cerebellar arteries:  Normal. Anterior inferior cerebellar arteries: Not visualized, which is not uncommon. Posterior inferior cerebellar arteries: Normal. IMPRESSION: 1. Acute infarct within the posterior left MCA distribution. No hemorrhage or mass effect. 2. Multifocal moderate stenosis within the bilateral MCA M2 distributions. No proximal intracranial arterial occlusion. Electronically Signed   By: Ulyses Jarred M.D.   On: 08/18/2016 06:05   Mr Cervical Spine Wo Contrast  Result Date: 08/22/2016 CLINICAL DATA:  Acute infarct. Numbness sensation in bilateral upper extremity. EXAM: MRI CERVICAL SPINE WITHOUT CONTRAST TECHNIQUE: Multiplanar, multisequence MR imaging of the cervical spine was performed. No intravenous contrast was administered. COMPARISON:  None. FINDINGS: Alignment: Mild anterolisthesis at C4-5 and C7-T1 due to facet arthropathy. Vertebrae: Large hemangioma in the T1 body. Probable small atypical hemangioma in the C7 superior body. Negative for acute fracture, aggressive bone lesion, or discitis. Cord: Normal signal and morphology  as permitted by artifact. Posterior Fossa, vertebral arteries, paraspinal tissues: Known acute left cerebral infarct. Known remote left superior cerebellar infarct. Known sphenoid sinus fluid level. Anterior corpus callosum gliosis. Disc levels: C2-3: Mild facet spurring.  No herniation or impingement C3-4: Facet arthropathy with bulky spurring on the right. Negative disc. Patent canal and foramina. Right foraminal narrowing that is mild. C4-5: Facet arthropathy with severe spurring on the right and mild anterolisthesis. Right uncovertebral ridging. Mild right foraminal narrowing. C5-6: Disc degeneration with left uncovertebral spurring and small posterior disc osteophyte complex. Ventral subarachnoid space effacement. Moderate to advanced left foraminal stenosis. C6-7: Degenerative disc narrowing with posterior disc osteophyte complex and left more than right uncovertebral ridging.  Moderate to advanced left foraminal stenosis. Ventral subarachnoid space effacement. No cord impingement. Incidental root sleeve cyst on the right. C7-T1:Facet spurring on the left. No impingement. Incidental root sleeve cyst on the left. IMPRESSION: 1. Multilevel facet arthropathy and uncovertebral ridging. 2. No significant spinal stenosis. 3. Moderate to advanced left foraminal narrowing at C5-6 and C6-7 Electronically Signed   By: Monte Fantasia M.D.   On: 08/22/2016 11:19   Mr Jodene Nam Head/brain WE Cm  Result Date: 08/18/2016 CLINICAL DATA:  Possible CVA. Right facial droop. Right arm and leg weakness. EXAM: MRI HEAD WITHOUT CONTRAST MRA HEAD WITHOUT CONTRAST TECHNIQUE: Multiplanar, multiecho pulse sequences of the brain and surrounding structures were obtained without intravenous contrast. Angiographic images of the head were obtained using MRA technique without contrast. COMPARISON:  Head CT 08/17/2016 FINDINGS: MRI HEAD FINDINGS Brain: There is diffusion restriction within the posterior left MCA distribution, involving the posterior left frontal lobe, the left insular cortex and the left parietal and temporal lobes. There is no associated hemorrhage. There is hyperintense T2 weighted signal within the corresponding distribution of the ischemia. There is otherwise mild periventricular T2 hyperintensity, but no other focal parenchymal signal abnormality. No mass lesion or midline shift. No hydrocephalus or extra-axial fluid collection. The midline structures are normal. No age advanced or lobar predominant atrophy. Vascular: Major intracranial arterial and venous sinus flow voids are preserved. No evidence of chronic microhemorrhage or amyloid angiopathy. Skull and upper cervical spine: The visualized skull base, calvarium, upper cervical spine and extracranial soft tissues are normal. Sinuses/Orbits: There is fluid within the left sphenoid sinus. Normal orbits. MRA HEAD FINDINGS Intracranial internal carotid  arteries: Normal. Anterior cerebral arteries: Normal. Middle cerebral arteries: There is multifocal moderate narrowing of both middle cerebral artery M2 segments. Posterior communicating arteries: Present bilaterally. Posterior cerebral arteries: Bilateral fetal origins, but otherwise normal. Basilar artery: Normal. Vertebral arteries: Left dominant. The right vertebral artery is very diminutive and may terminate in the posterior inferior cerebellar artery. Superior cerebellar arteries: Normal. Anterior inferior cerebellar arteries: Not visualized, which is not uncommon. Posterior inferior cerebellar arteries: Normal. IMPRESSION: 1. Acute infarct within the posterior left MCA distribution. No hemorrhage or mass effect. 2. Multifocal moderate stenosis within the bilateral MCA M2 distributions. No proximal intracranial arterial occlusion. Electronically Signed   By: Ulyses Jarred M.D.   On: 08/18/2016 06:05    ECHO 08/19/16  ------------------------------------------------------------------- Study Conclusions  - Left ventricle: The cavity size was normal. Wall thickness was   normal. Systolic function was normal. The estimated ejection   fraction was in the range of 55% to 60%. Doppler parameters are   consistent with abnormal left ventricular relaxation (grade 1   diastolic dysfunction). - Atrial septum: No defect or patent foramen ovale was identified. - Pulmonary arteries: PA peak pressure:  36 mm Hg (S).   Discharge Exam: Vitals:   08/22/16 0609 08/22/16 1157  BP: (!) 143/55 124/68  Pulse: 88 87  Resp: 18 18  Temp: 98.7 F (37.1 C)    Vitals:   08/21/16 2116 08/22/16 0146 08/22/16 0609 08/22/16 1157  BP: 126/72 137/68 (!) 143/55 124/68  Pulse: 92 83 88 87  Resp: '18 18 18 18  '$ Temp: 98.5 F (36.9 C) 97.8 F (36.6 C) 98.7 F (37.1 C)   TempSrc: Oral Oral Oral   SpO2: 99% 96% 98% 98%  Weight:      Height:        General: Pt is alert, awake, not in acute  distress Cardiovascular: RRR, S1/S2 +, no rubs, no gallops Respiratory: CTA bilaterally, no wheezing, no rhonchi Abdominal: Soft, NT, ND, bowel sounds + Extremities: no edema, no cyanosis    The results of significant diagnostics from this hospitalization (including imaging, microbiology, ancillary and laboratory) are listed below for reference.     Microbiology: No results found for this or any previous visit (from the past 240 hour(s)).   Labs: BNP (last 3 results)  Recent Labs  07/19/16 1510  BNP 983.3*   Basic Metabolic Panel:  Recent Labs Lab 08/17/16 1752 08/19/16 0339  NA 138 141  K 4.0 3.6  CL 104 106  CO2 23 26  GLUCOSE 93 103*  BUN 13 9  CREATININE 1.04* 0.99  CALCIUM 10.0 8.7*   Liver Function Tests:  Recent Labs Lab 08/17/16 1752  AST 20  ALT 19  ALKPHOS 59  BILITOT 0.3  PROT 6.0*  ALBUMIN 3.4*   No results for input(s): LIPASE, AMYLASE in the last 168 hours. No results for input(s): AMMONIA in the last 168 hours. CBC:  Recent Labs Lab 08/17/16 1752 08/19/16 0339  WBC 7.3 5.3  NEUTROABS 5.8 3.2  HGB 11.6* 10.1*  HCT 34.9* 31.5*  MCV 91.4 92.4  PLT 190 198   Cardiac Enzymes:  Recent Labs Lab 08/17/16 2210 08/18/16 0333 08/18/16 0945  TROPONINI 0.04* 0.06* <0.03   BNP: Invalid input(s): POCBNP CBG: No results for input(s): GLUCAP in the last 168 hours. D-Dimer No results for input(s): DDIMER in the last 72 hours. Hgb A1c  Recent Labs  08/21/16 0356  HGBA1C 5.7*   Lipid Profile No results for input(s): CHOL, HDL, LDLCALC, TRIG, CHOLHDL, LDLDIRECT in the last 72 hours. Thyroid function studies No results for input(s): TSH, T4TOTAL, T3FREE, THYROIDAB in the last 72 hours.  Invalid input(s): FREET3 Anemia work up No results for input(s): VITAMINB12, FOLATE, FERRITIN, TIBC, IRON, RETICCTPCT in the last 72 hours. Urinalysis    Component Value Date/Time   COLORURINE YELLOW 08/17/2016 [place]  08/17/2016 1634   LABSPEC 1.009 08/17/2016 1634   PHURINE 7.0 08/17/2016 1634   GLUCOSEU NEGATIVE 08/17/2016 1634   HGBUR SMALL (A) 08/17/2016 1634   BILIRUBINUR NEGATIVE 08/17/2016 1634   KETONESUR NEGATIVE 08/17/2016 1634   PROTEINUR NEGATIVE 08/17/2016 1634   NITRITE NEGATIVE 08/17/2016 1634   LEUKOCYTESUR NEGATIVE 08/17/2016 1634   Sepsis Labs Invalid input(s): PROCALCITONIN,  WBC,  LACTICIDVEN Microbiology No results found for this or any previous visit (from the past 240 hour(s)).   Time coordinating discharge: Over 30 minutes  SIGNED:  Chipper Oman, MD  [facility] 08/22/2016, 2:35 PM Pager   If 7PM-7AM, please contact night-coverage [email or web address] Password [email or web address]

## 2016-08-22 NOTE — Clinical Social Work Note (Signed)
CSW notified pt ready for DC. Magda Paganini from [facility] has already called this AM to say pt's bed is ready. CSW spoke to bedside RN, pt needs a late DC due to bubble study. CSW updated facility, facility reports they have to have DC Summary by 345PM to accept pt today. CSW updated MD via page.   Per [facility], a new Josem Kaufmann is needed since pt did not come yesterday from [facility]. [facility] called [facility], current Josem Kaufmann #[id number] is good until Jan 19th. Magda Paganini @ facility has been updated.  CSW will continue to follow.  Ayyub Krall B. Joline Maxcy Clinical Social Work Dept Weekend Social Worker [phone] 12:33 PM

## 2016-08-22 NOTE — Progress Notes (Signed)
Occupational Therapy Treatment Patient Details Name: Stephanie [name] MRN: [id number] DOB: [birth year] Today's Date: 08/22/2016    History of present illness 81 y.o. female with medical history significant of CAD and COPD on home O2 presenting with possible CVA. Pt found to have acute L sided temporal/parietal lobe CVA   OT comments  Pt progressing well toward OT goals. She remains limited by expressive difficulties but able to better attend to R visual field during ADL this session. With grooming tasks to brush hair, noted that pt attempting to brush hair with back of comb but able to self-correct. She continues to require min assist with toilet transfers. Per family report, pt with difficulty manipulating small utensils and provided tubing to build up handles. Planned to attempt with pt but limited session time due to transport arriving for testing. OT will continue to follow acutely. Updated D/C recommendations, as per PT note and family pt unable to D/C to CIR. Feel pt will need continued rehabilitation services post-acute D/C and would benefit from short term SNF placement.   Follow Up Recommendations  SNF    Equipment Recommendations  None recommended by OT    Recommendations for Other Services      Precautions / Restrictions Precautions Precautions: Fall Precaution Comments: Rt field cut Restrictions Weight Bearing Restrictions: No       Mobility Bed Mobility               General bed mobility comments: OOB with family in bathroom on OT arrival.  Transfers Overall transfer level: Needs assistance Equipment used: None Transfers: Sit to/from Stand Sit to Stand: Min assist              Balance Overall balance assessment: Needs assistance Sitting-balance support: Single extremity supported;Feet supported Sitting balance-Leahy Scale: Fair     Standing balance support: No upper extremity supported;During functional activity;Single extremity  supported Standing balance-Leahy Scale: Fair                     ADL Overall ADL's : Needs assistance/impaired   Eating/Feeding Details (indicate cue type and reason): Family reports difficulty managing utensils and provided tubing to build up utensils. Unable to attempt with pt as session cut short for transport. Grooming: Supervision/safety;Sitting Grooming Details (indicate cue type and reason): Attempting to use brush upside down.                 Toilet Transfer: Minimal assistance;Ambulation;BSC           Functional mobility during ADLs: Minimal assistance General ADL Comments: Improving attention to R visual field and R UE this session but limitations remain.      Vision                     Perception     Praxis      Cognition   Behavior During Therapy: [facility] for tasks assessed/performed Overall Cognitive Status: Difficult to assess Area of Impairment: Attention;Awareness   Current Attention Level: Sustained        Awareness:  (decreased R side awareness)   General Comments: Wernicke's aphasia    Extremity/Trunk Assessment  Upper Extremity Assessment Upper Extremity Assessment: Generalized weakness   Lower Extremity Assessment Lower Extremity Assessment: Generalized weakness        Exercises     Shoulder Instructions       General Comments      Pertinent Vitals/ Pain       Pain Assessment: No/denies pain  Home Living Family/patient expects to be discharged to:: Private residence Living Arrangements: Alone Available Help at Discharge: Family;Available PRN/intermittently Type of Home: Apartment Home Access: Stairs to enter Entrance Stairs-Number of Steps: 2-3 small steps   Home Layout: Two level Alternate Level Stairs-Number of Steps: 13 Alternate Level Stairs-Rails: Right Bathroom Shower/Tub: Tub/shower unit Shower/tub characteristics: Architectural technologist: Standard     Home Equipment: Tub bench;Grab bars -  tub/shower;Hand held shower head      Lives With: Alone    Prior Functioning/Environment Level of Independence: Independent        Comments: Pt is very active still driving and running all her errands independently   Frequency  Min 2X/week        Progress Toward Goals  OT Goals(current goals can now be found in the care plan section)  Progress towards OT goals: Progressing toward goals  Acute Rehab OT Goals Patient Stated Goal: TO go to rehab OT Goal Formulation: With patient/family Time For Goal Achievement: 09/01/16 Potential to Achieve Goals: Good ADL Goals Pt Will Perform Grooming: with supervision;sitting;standing Pt Will Perform Upper Body Bathing: with supervision;sitting;standing Pt Will Perform Lower Body Bathing: with supervision;sit to/from stand Pt Will Perform Upper Body Dressing: with supervision;sitting;standing Pt Will Perform Lower Body Dressing: with supervision;sit to/from stand Pt Will Transfer to Toilet: with supervision;ambulating;regular height toilet Pt Will Perform Toileting - Clothing Manipulation and hygiene: with supervision;sit to/from stand;sitting/lateral leans Pt Will Perform Tub/Shower Transfer: with supervision;ambulating;tub bench Additional ADL Goal #1: Pt will demonstrate increased awareness of RUE and Rt visual field to increase independence with self-feeding with supervision  Plan Discharge plan remains appropriate    Co-evaluation                 End of Session Equipment Utilized During Treatment: Gait belt   Activity Tolerance Patient tolerated treatment well;No increased pain   Patient Left in bed (transporting for bubble study)   Nurse Communication Mobility status        Time: 8377-9396 OT Time Calculation (min): 20 min  Charges: OT General Charges $OT Visit: 1 Procedure OT Treatments $Self Care/Home Management : 8-22 mins  Norman Herrlich, OTR/L [phone] 08/22/2016, 4:05 PM

## 2016-08-22 NOTE — Clinical Social Work Placement (Signed)
   CLINICAL SOCIAL WORK PLACEMENT  NOTE  Date:  08/22/2016  Patient Details  Name: Stephanie [name] MRN: [id number] Date of Birth: [birth year]  Clinical Social Work is seeking post-discharge placement for this patient at the [place] level of care (*CSW will initial, date and re-position this form in  chart as items are completed):  Yes   Patient/family provided with [place] Work Department's list of facilities offering this level of care within the geographic area requested by the patient (or if unable, by the patient's family).  Yes   Patient/family informed of their freedom to choose among providers that offer the needed level of care, that participate in Medicare, Medicaid or managed care program needed by the patient, have an available bed and are willing to accept the patient.  Yes   Patient/family informed of [facility]'s ownership interest in [facility] and [facility], as well as of the fact that they are under no obligation to receive care at these facilities.  PASRR submitted to EDS on       PASRR number received on       Existing PASRR number confirmed on 08/19/16     FL2 transmitted to all facilities in geographic area requested by pt/family on 08/19/16     FL2 transmitted to all facilities within larger geographic area on       Patient informed that his/her managed care company has contracts with or will negotiate with certain facilities, including the following:        Yes   Patient/family informed of bed offers received.  Patient chooses bed at [facility]     Physician recommends and patient chooses bed at      Patient to be transferred to  [facility]) on 08/22/16.  Patient to be transferred to facility by  (Dtr)     Patient family notified on 08/22/16 of transfer.  Name of family member notified:  Dtr at bedside, will transfer pt     PHYSICIAN Please sign DNR, Please sign  FL2     Additional Comment:    _______________________________________________ Serafina Mitchell, LCSWA 08/22/2016, 4:33 PM

## 2016-08-22 NOTE — Progress Notes (Signed)
Patient ready for discharge to SNF; daughter is driving her to the facility; discharge instructions given and reviewed;Rx's given; report called to [facility]; patient discharged out via wheelchair accompanied by her daughter.

## 2016-08-31 ENCOUNTER — Telehealth: Payer: Self-pay

## 2016-08-31 NOTE — Telephone Encounter (Signed)
Rn call patients daughter Stephanie [name] about the letter concerning her mom. Pt is currently in a nursing facility recovering from a stroke. Stephanie [name] stated they current they are currently in the process of getting health care power of attorney for the mom. They wanted a letter from Dr. Erlinda Hong stating pt is unable to care for her finances and cannot make decisions. Rn requested the family ask the doctors at the facility. Stephanie [name] stated they ask the NP at the facility and she is unable to do it.Rn stated Dr. Erlinda Hong recommend they ask the facility md. Stephanie [name] stated the NP at the facility requested the letter has to come from a outside MD. Pt's daughter stated she was seeing Dr.Zachary Potts at [facility] for memory prior to her having the stroke. Rn requested they call the neurologist in [place] for a letter. Stephanie [name] verbalized understanding they will call Dr.Potter.

## 2016-09-01 ENCOUNTER — Ambulatory Visit (INDEPENDENT_AMBULATORY_CARE_PROVIDER_SITE_OTHER): Payer: PPO | Admitting: *Deleted

## 2016-09-01 ENCOUNTER — Other Ambulatory Visit: Payer: Self-pay | Admitting: *Deleted

## 2016-09-01 DIAGNOSIS — I639 Cerebral infarction, unspecified: Secondary | ICD-10-CM

## 2016-09-01 NOTE — Patient Outreach (Signed)
[facility]) Care Management  09/01/2016  Mannat Benedetti Aug 01, 1936 [id number]   Met with Drue Novel, SW at facility regarding patient. She states that patient is going to remain at facility in the [place] pay after her skilled nursing facility stay. No discharge needs at this time.   Plan to sign off. Royetta Crochet. Laymond Purser, RN, BSN, [place] [phone]) Business Cell  [phone]) Toll Free Office

## 2016-09-01 NOTE — Progress Notes (Signed)
Wound Loop check in clinic. Ster-Strips removed, wound well-healed, edges approximated. No redness or swelling. Battery status: good. R-waves 0.26 mV. 0 symptom episodes, 0 tachy episodes, 0 pause episodes, 0 brady episodes. 0 AF episodes. Monthly summary reports and ROV with WC PRN. Pts daughter to call device clinic with serial number from home monitor, to enroll in [facility].

## 2016-09-13 DIAGNOSIS — F0390 Unspecified dementia without behavioral disturbance: Secondary | ICD-10-CM | POA: Diagnosis not present

## 2016-09-13 DIAGNOSIS — F329 Major depressive disorder, single episode, unspecified: Secondary | ICD-10-CM | POA: Diagnosis not present

## 2016-09-13 DIAGNOSIS — M542 Cervicalgia: Secondary | ICD-10-CM | POA: Diagnosis not present

## 2016-09-13 DIAGNOSIS — R200 Anesthesia of skin: Secondary | ICD-10-CM | POA: Diagnosis not present

## 2016-09-13 DIAGNOSIS — G479 Sleep disorder, unspecified: Secondary | ICD-10-CM | POA: Diagnosis not present

## 2016-09-14 DIAGNOSIS — I63432 Cerebral infarction due to embolism of left posterior cerebral artery: Secondary | ICD-10-CM | POA: Diagnosis not present

## 2016-09-19 ENCOUNTER — Ambulatory Visit (INDEPENDENT_AMBULATORY_CARE_PROVIDER_SITE_OTHER): Payer: PPO | Admitting: *Deleted

## 2016-09-19 DIAGNOSIS — I639 Cerebral infarction, unspecified: Secondary | ICD-10-CM | POA: Diagnosis not present

## 2016-09-19 NOTE — Progress Notes (Signed)
Carelink Summary Report / Loop Recorder 

## 2016-09-21 DIAGNOSIS — I69320 Aphasia following cerebral infarction: Secondary | ICD-10-CM | POA: Diagnosis not present

## 2016-09-21 DIAGNOSIS — I69398 Other sequelae of cerebral infarction: Secondary | ICD-10-CM | POA: Diagnosis not present

## 2016-09-21 DIAGNOSIS — M6281 Muscle weakness (generalized): Secondary | ICD-10-CM | POA: Diagnosis not present

## 2016-09-21 DIAGNOSIS — I69314 Frontal lobe and executive function deficit following cerebral infarction: Secondary | ICD-10-CM | POA: Diagnosis not present

## 2016-09-27 ENCOUNTER — Other Ambulatory Visit: Payer: Self-pay | Admitting: Cardiology

## 2016-10-03 ENCOUNTER — Other Ambulatory Visit: Payer: Self-pay | Admitting: *Deleted

## 2016-10-03 ENCOUNTER — Encounter: Payer: Self-pay | Admitting: *Deleted

## 2016-10-03 DIAGNOSIS — J449 Chronic obstructive pulmonary disease, unspecified: Secondary | ICD-10-CM | POA: Diagnosis not present

## 2016-10-03 DIAGNOSIS — F419 Anxiety disorder, unspecified: Secondary | ICD-10-CM | POA: Diagnosis not present

## 2016-10-03 DIAGNOSIS — I69320 Aphasia following cerebral infarction: Secondary | ICD-10-CM | POA: Diagnosis not present

## 2016-10-03 DIAGNOSIS — Z7902 Long term (current) use of antithrombotics/antiplatelets: Secondary | ICD-10-CM | POA: Diagnosis not present

## 2016-10-03 DIAGNOSIS — H539 Unspecified visual disturbance: Secondary | ICD-10-CM | POA: Diagnosis not present

## 2016-10-03 DIAGNOSIS — F329 Major depressive disorder, single episode, unspecified: Secondary | ICD-10-CM | POA: Diagnosis not present

## 2016-10-03 DIAGNOSIS — I69314 Frontal lobe and executive function deficit following cerebral infarction: Secondary | ICD-10-CM | POA: Diagnosis not present

## 2016-10-03 NOTE — Patient Outreach (Signed)
Phone call successful, follow up on earlier phone call made by RN CM for  transition of care.  Daughter Butch Penny answered the phone, handed pt the phone, HIPAA/identity provided with help from daughter (pt's speech affected by recent stroke), pt gave permission to speak with daughter about her health. Daughter verified she is pt's  HCPOA as well as sister Lovette Cliche.  RN CM discussed with daughter purpose of call, following up on  [employer] referral received - pt's recent  SNF discharge 2/23, recent hospitalization 1/15- 09/16/16 for CVA.   Daughter reports a week before pt had a stroke was in the hospital for COPD flare up.   Daughter reports pt lives alone, but she is  currently staying with her to see how she does on her own.  Daughter reports recent stroke affected pt's  vision, right hand- unable to write now, but speech is her main issue.  Daughter reports pt is getting home health speech therapy through [facility] RN available if needed, does not need HH PT/OT.  Daughter reports MD follow up appointments for pt are  scheduled, she will provide transportation.  Daughter reports pt was doing her own medications prior to stroke, she is assisting pt now, pt taking all medications as ordered, no problems with affording Medications, discharge summary not available to review medications.    RN CM discussed with daughter [facility] transition of care program - follow pt 31 days (weekly phone calls, a home visit)- no cost to pt/benefit of her insurance to which daughter agreed to have pt receive services.   Plan:   As discussed with pt's daughter, RN CM to  follow up again with pt  in 2 days - initial home visit.             Barrier letter sent to [employer] PA by in basket - informing of [facility] involvement.                Zara Chess.   [place] Care Management  [phone]

## 2016-10-03 NOTE — Patient Outreach (Signed)
Phone call made, follow up on referral from [employer] received 2/21- recent hospitalization 1/15-09/16/16 CVA then discharged to SNF ([facility]), was to discharge home 2/23.   Person answering the phone reports is pt's daughter Stephanie [name], pt currently with [facility], can talk to her- pt's 71.   RN CM requested to talk with pt, request permission to talk to daughter to which did not want to bother pt at this time.    Plan:  RN CM to follow up with pt later today.    Stephanie [name].   [place] Care Management  [phone]

## 2016-10-05 ENCOUNTER — Encounter: Payer: Self-pay | Admitting: *Deleted

## 2016-10-05 ENCOUNTER — Other Ambulatory Visit: Payer: Self-pay | Admitting: Physician Assistant

## 2016-10-05 ENCOUNTER — Other Ambulatory Visit: Payer: Self-pay | Admitting: *Deleted

## 2016-10-05 ENCOUNTER — Telehealth: Payer: Self-pay

## 2016-10-05 DIAGNOSIS — R030 Elevated blood-pressure reading, without diagnosis of hypertension: Secondary | ICD-10-CM

## 2016-10-05 MED ORDER — AMLODIPINE BESYLATE 5 MG PO TABS: 5.0000 mg | ORAL_TABLET | Freq: Every day | ORAL | 0 refills | Status: DC

## 2016-10-05 NOTE — Patient Outreach (Signed)
Received  a call from Nance Pew (pt's daughter, HCPOA, on consent form) informing RN CM that MD office called back (related to call made earlier by RN CM about pt's elevated BP).  Daughter reports pt to keep appointment to see  Simona Huh Chrismon PA  3/1 plus a BP  medication was called in, does not remember the name, pt to take it once a day.   Plan:  RN CM to follow up again with daughter next week telephonically.    Note:  View of pt's medications in Epic, Amlodipine 5 mg was started today, pt to take daily.    Zara Chess.   [place] Care Management  [phone]

## 2016-10-05 NOTE — Progress Notes (Signed)
Patient is 80 w/ hx of CVA on 08/17/2016 who has been in SNF since discharge. Patient's home health nurse called in with BP reading of 170/90 and then 180/100. Wanted patient to be seen today, but no openings. Has appt tomorrow with Stephanie [name] at 9:30. Asymptomatic right now, BP had stabilized in hospital and so discharged with no chronic HTN medications. Will send in some amlodipine '5mg'$  daily for patient to take. If she starts to have symptoms like headache, vision change, slurred speech, or weakness she needs to be seen in the emergency room. Discussed this with Dr. Caryn [name] and he is in agreement with plan.

## 2016-10-05 NOTE — Patient Outreach (Signed)
[facility]) Care Management   10/05/2016  Stephanie [name] 04-17-1936 [id number]  Stephanie [name] is an 81 y.o. female  Subjective:  Daughter Butch Penny (pt's HCPOA) reports pt has been able to stay by herself the last 2 days also has Life Alert.   Daughter reports pt's stroke continues to affect her speech, difficulty expressing what she wants to say although at times is clear.  Daughter reports HH ST  To come again tomorrow.  Daughter reports pt saw Eye MD 2/7, was told stroke affected her frontal vision to  the right.  Daughter reports pt's recent hospitalization showed she had a hole in her heart which allowed blood clot to get through, resulting in stroke.  Daughter reports pt now has  An internal heart monitor.   Pt reports currently no pain but left leg at times bothers her.  Daughter reports was  c/o pain to left side of head, been giving her two 500 mg of Tylenol twice a day  which is helping.     Objective:   Vitals:   10/05/16 1410 10/05/16 1432  BP: (!) 170/90 (!) 180/100  Pulse: 79   Resp: 20     ROS  Physical Exam  Constitutional: She is oriented to person, place, and time. She appears well-developed and well-nourished.  Cardiovascular: Normal rate, regular rhythm and normal heart sounds.   Respiratory: Effort normal.  Crackles noted on inspiration in posterior left lower lobe.    GI: Soft.  Musculoskeletal: She exhibits edema.  Trace edema in left ankle   Neurological: She is alert and oriented to person, place, and time.  Skin: Skin is warm and dry.  Psychiatric: She has a normal mood and affect. Her behavior is normal.  Expressive aphasia - recent stroke     Encounter Medications:   Outpatient Encounter Prescriptions as of 10/05/2016  Medication Sig Note  . acetaminophen (TYLENOL) 500 MG tablet Take 1 tablet by mouth at bedtime.  10/05/2016: Pt taking 2 tablets bid   . albuterol (VENTOLIN HFA) 108 (90 Base) MCG/ACT inhaler Inhale 2 puffs  into the lungs every 6 (six) hours as needed.   Marland Kitchen apixaban (ELIQUIS) 5 MG TABS tablet Take 1 tablet (5 mg total) by mouth 2 (two) times daily.   Marland Kitchen atorvastatin (LIPITOR) 40 MG tablet Take 1 tablet (40 mg total) by mouth daily at 6 PM.   . budesonide-formoterol (SYMBICORT) 160-4.5 MCG/ACT inhaler Inhale 2 puffs into the lungs 2 (two) times daily.   . Calcium Carb-Cholecalciferol (CALCIUM 600+D) 600-800 MG-UNIT TABS Take 2 tablets by mouth daily.   . clonazePAM (KLONOPIN) 0.5 MG tablet Take 1 tablet (0.5 mg total) by mouth 2 (two) times daily.   Marland Kitchen docusate sodium (COLACE) 100 MG capsule Take 200 mg by mouth at bedtime.   . gabapentin (NEURONTIN) 100 MG capsule Take 1 capsule (100 mg total) by mouth 3 (three) times daily.   Marland Kitchen PARoxetine (PAXIL) 20 MG tablet Take 20 mg by mouth daily.   Marland Kitchen tiotropium (SPIRIVA HANDIHALER) 18 MCG inhalation capsule Place 1 capsule (18 mcg total) into inhaler and inhale daily.   Marland Kitchen apixaban (ELIQUIS) 5 MG TABS tablet Take 2 tablets (10 mg total) by mouth 2 (two) times daily. (Patient not taking: Reported on 09/01/2016)   . bisacodyl (DULCOLAX) 5 MG EC tablet Take 1 tablet (5 mg total) by mouth daily as needed for moderate constipation. (Patient not taking: Reported on 10/05/2016)   . ibuprofen (ADVIL,MOTRIN) 200 MG tablet Take  400 mg by mouth daily as needed for headache or moderate pain.    Marland Kitchen PARoxetine (PAXIL) 10 MG tablet TAKE TWO TABLETS BY MOUTH AT BEDTIME DAILY (Patient not taking: Reported on 10/05/2016)   . senna-docusate (SENOKOT-S) 8.6-50 MG tablet Take 1 tablet by mouth at bedtime as needed for mild constipation. (Patient not taking: Reported on 10/05/2016)    Facility-Administered Encounter Medications as of 10/05/2016  Medication  . ipratropium-albuterol (DUONEB) 0.5-2.5 (3) MG/3ML nebulizer solution 3 mL    Functional Status:   In your present state of health, do you have any difficulty performing the following activities: 10/05/2016 08/17/2016  Hearing? N N   Vision? Y N  Difficulty concentrating or making decisions? Y N  Walking or climbing stairs? N Y  Dressing or bathing? N N  Doing errands, shopping? Y N  Preparing Food and eating ? N -  Using the Toilet? N -  In the past six months, have you accidently leaked urine? N -  Do you have problems with loss of bowel control? N -  Managing your Medications? Y -  Managing your Finances? Y -  Housekeeping or managing your Housekeeping? Y -  Some recent data might be hidden    Fall/Depression Screening:    PHQ 2/9 Scores 10/05/2016 07/04/2016 06/29/2015  PHQ - 2 Score 1 0 0    Assessment:  Pleasant 81 year old woman, dealing with expressive aphasia from recent stroke.     Daughter present during home visit, assisted pt in providing RN CM with health information.  Lungs-  Crackles noted on inspiration in posterior left lower lobe, sob noted with exertion (walking short distances).       Recent CVA:   Expressive aphasia noted, a few words were clear.    Hypertension:  BP 170/90 right arm (checked twice), had pt drink water recheck 180/100.  Left arm 160/90.   No c/o pain, headache.    Plan: RN CM called MD office Simona Huh Chrismon PA), spoke with [facility] (MD's nurse) about elevated BP              Readings, request for  pt be seen sooner than scheduled f/u appointment 3/9.  MD nurse to relay              Message to other PA in office as Vernie Murders PA not in today.              RN CM to continue to follow pt for transition of care, follow up again with daughter (until pt improves             With speech) next week telephonically.              Plan to send Vernie Murders PA by in basket 2/28 home visit encounter.       [facility] CM Care Plan Problem One   Flowsheet Row Most Recent Value  Care Plan Problem One  Risk for readmission related to recent SNF discharge,recent hosptialization for CVA  Role Documenting the Problem One  Care Management Coordinator  Care Plan for Problem One  Active  [facility]  Long Term Goal (31-90 days)  Pt will not readmit to SNF or hospital within the next 31 days   [facility] Long Term Goal Start Date  10/03/16  Interventions for Problem One Long Term Goal  Provided/reviewed Emmi information on Preventing a second stroke, Recovery after stroke   [facility] CM Short Term Goal #1 (0-30 days)  Pt  would keep all MD appointments in the next 30 days   [facility] CM Short Term Goal #1 Start Date  10/03/16  Interventions for Short Term Goal #1  Reviewed with pt/daughter upcoming MD appointments   [facility] CM Short Term Goal #2 (0-30 days)  Pt would see improvement in speech in the next 30 days   [facility] CM Short Term Goal #2 Start Date  10/03/16  Interventions for Short Term Goal #2  Discussed with daughter pt working with [facility], improvement seen as evidenced by pt or daughter reporting to [facility] RN CM     [facility] CM Care Plan Problem Two   Flowsheet Row Most Recent Value  Care Plan Problem Two  Elevated BP   Role Documenting the Problem Two  Care Management [place] for Problem Two  Active  [facility] CM Short Term Goal #1 (0-30 days)  Pt BP would be within normal limits in the next 30 days   [facility] CM Short Term Goal #1 Start Date  10/05/16  Interventions for Short Term Goal #2   Urgent call made to Primary Care MD office, reported elevated BP, nurse to see if pt can be seen today or tomorrow,      Zara Chess.   [place] Care Management  [phone]

## 2016-10-06 ENCOUNTER — Encounter: Payer: Self-pay | Admitting: Family Medicine

## 2016-10-06 ENCOUNTER — Encounter: Payer: Self-pay | Admitting: Neurology

## 2016-10-06 ENCOUNTER — Ambulatory Visit (INDEPENDENT_AMBULATORY_CARE_PROVIDER_SITE_OTHER): Payer: PPO | Admitting: Family Medicine

## 2016-10-06 ENCOUNTER — Ambulatory Visit (INDEPENDENT_AMBULATORY_CARE_PROVIDER_SITE_OTHER): Payer: PPO | Admitting: Neurology

## 2016-10-06 ENCOUNTER — Telehealth: Payer: Self-pay

## 2016-10-06 VITALS — BP 124/75 | HR 80 | Wt 124.4 lb

## 2016-10-06 VITALS — BP 170/82 | HR 82 | Temp 97.9°F | Resp 16 | Wt 125.0 lb

## 2016-10-06 DIAGNOSIS — R4701 Aphasia: Secondary | ICD-10-CM | POA: Diagnosis not present

## 2016-10-06 DIAGNOSIS — I639 Cerebral infarction, unspecified: Secondary | ICD-10-CM

## 2016-10-06 DIAGNOSIS — J449 Chronic obstructive pulmonary disease, unspecified: Secondary | ICD-10-CM

## 2016-10-06 DIAGNOSIS — I10 Essential (primary) hypertension: Secondary | ICD-10-CM | POA: Diagnosis not present

## 2016-10-06 MED ORDER — SPACER/AERO CHAMBER MOUTHPIECE MISC: 1 refills | Status: DC

## 2016-10-06 NOTE — Patient Instructions (Signed)
I had a long d/w patient and her 2 daughters about her recent embolic stroke, DVT, PFO,risk for recurrent stroke/TIAs, personally independently reviewed imaging studies and stroke evaluation results and answered questions.Continue Eliquis (apixaban) daily given acute DVT for secondary stroke prevention and maintain strict control of hypertension with blood pressure goal below 130/90, diabetes with hemoglobin A1c goal below 6.5% and lipids with LDL cholesterol goal below 70 mg/dL. I also advised the patient to eat a healthy diet with plenty of whole grains, cereals, fruits and vegetables, exercise regularly and maintain ideal body weight .She was also advised to continue ongoing speech therapy and fall and safety precautions. Followup in the future with my nurse practitioner in 3 months or call earlier if necessary.   Stroke Prevention Some medical conditions and behaviors are associated with an increased chance of having a stroke. You may prevent a stroke by making healthy choices and managing medical conditions. How can I reduce my risk of having a stroke?  Stay physically active. Get at least 30 minutes of activity on most or all days.  Do not smoke. It may also be helpful to avoid exposure to secondhand smoke.  Limit alcohol use. Moderate alcohol use is considered to be:  No more than 2 drinks per day for men.  No more than 1 drink per day for nonpregnant women.  Eat healthy foods. This involves:  Eating 5 or more servings of fruits and vegetables a day.  Making dietary changes that address high blood pressure (hypertension), high cholesterol, diabetes, or obesity.  Manage your cholesterol levels.  Making food choices that are high in fiber and low in saturated fat, trans fat, and cholesterol may control cholesterol levels.  Take any prescribed medicines to control cholesterol as directed by your health care provider.  Manage your diabetes.  Controlling your carbohydrate and sugar  intake is recommended to manage diabetes.  Take any prescribed medicines to control diabetes as directed by your health care provider.  Control your hypertension.  Making food choices that are low in salt (sodium), saturated fat, trans fat, and cholesterol is recommended to manage hypertension.  Ask your health care provider if you need treatment to lower your blood pressure. Take any prescribed medicines to control hypertension as directed by your health care provider.  If you are 8-21 years of age, have your blood pressure checked every 3-5 years. If you are 75 years of age or older, have your blood pressure checked every year.  Maintain a healthy weight.  Reducing calorie intake and making food choices that are low in sodium, saturated fat, trans fat, and cholesterol are recommended to manage weight.  Stop drug abuse.  Avoid taking birth control pills.  Talk to your health care provider about the risks of taking birth control pills if you are over 44 years old, smoke, get migraines, or have ever had a blood clot.  Get evaluated for sleep disorders (sleep apnea).  Talk to your health care provider about getting a sleep evaluation if you snore a lot or have excessive sleepiness.  Take medicines only as directed by your health care provider.  For some people, aspirin or blood thinners (anticoagulants) are helpful in reducing the risk of forming abnormal blood clots that can lead to stroke. If you have the irregular heart rhythm of atrial fibrillation, you should be on a blood thinner unless there is a good reason you cannot take them.  Understand all your medicine instructions.  Make sure that other conditions (such  as anemia or atherosclerosis) are addressed. Get help right away if:  You have sudden weakness or numbness of the face, arm, or leg, especially on one side of the body.  Your face or eyelid droops to one side.  You have sudden confusion.  You have trouble  speaking (aphasia) or understanding.  You have sudden trouble seeing in one or both eyes.  You have sudden trouble walking.  You have dizziness.  You have a loss of balance or coordination.  You have a sudden, severe headache with no known cause.  You have new chest pain or an irregular heartbeat. Any of these symptoms may represent a serious problem that is an emergency. Do not wait to see if the symptoms will go away. Get medical help at once. Call your local emergency services (911 in [place] Do not drive yourself to the hospital. This information is not intended to replace advice given to you by your health care provider. Make sure you discuss any questions you have with your health care provider. Document Released: 09/01/2004 Document Revised: 12/31/2015 Document Reviewed: 01/25/2013 Elsevier Interactive Patient Education  2017 [employer].

## 2016-10-06 NOTE — Telephone Encounter (Signed)
Error

## 2016-10-06 NOTE — Progress Notes (Signed)
[facility] [address] [place]. [place] [address] [phone]       OFFICE FOLLOW-UP NOTE  Stephanie. Stephanie [name] Date of Birth:  October 26, 1935 Medical Record Number:  [id number]   HPI: Stephanie [name] is a 63 year Caucasian lady seen today for first office follow-up visit following hospital admission for stroke in January 2018. She is accompanied by 2 of 4 daughters and history is obtained from them as well as review of hospital records. Have personally reviewed imaging studies.81 year old female with medical history significant for CAD and COPD not on home oxygen presented to the ED with aphasia and left-sided weakness on 08/17/2016, CT head showed acute infarct in the left temporal and parietal cortex. Patient admitted for further evaluation, neurology was consulted TPA was not given due to delay in arrival. During hospital stay patient was schedule for TEE, but did not tolerated the procedure. So TCD bubble study was done which suggested a large PFO with shunt present at rest. Patient was also found to have a DVT at the proximal popliteal LLE. Patient was planned to have an IVC filter but after discussion with neurology, was felt safe that patient to be started on a/c. Patient started on Eliquis. Patient was evaluated by physical therapy and was recommended short therm rehab. MRI scan showed a large posterior left MCA territory infarct. MRA showed moderate stenosis of the left M2 segment of the middle cerebral artery. Carotid ultrasound showed no significant stenosis. Transthoracic echo showed normal ejection fraction. Lower extremity venous Doppler showed deep veinthrombosis. LDL cholesterol was mildly elevated at 99 mg percent. Hemoglobin A1c was normal at 5.7. Patient was started on eliquis. She states she's done well since discharge has not had any bleeding or bruising problems. She continues to have significant impaired speech and comprehension but she is living home alone now  since last 1 week.. She has a life alert. Her daughters check on her several times a day. She does get confused occasionally but is able to manage most of her friends. The daughters have close supervision of her medications. Patient blood pressure well controlled today it is 1-4/75. She has a loop recorder inserted and so far paroxysmal atrial fibrillation has noted been found  ROS:   14 system review of systems is positive for loss of vision, painful urination, memory loss, dizziness, headache, numbness, speech difficulty, weakness, confusion and all other systems negative  PMH:  Past Medical History:  Diagnosis Date  . Arthritis   . Complication of anesthesia   . COPD (chronic obstructive pulmonary disease) ([place])   . Coronary artery disease 2013   stent  . Depression   . Edema   . Pneumonia    IN PAST  . PONV (postoperative nausea and vomiting)   . Stroke [facility])     Social History:  Social History   Social History  . Marital status: Widowed    Spouse name: N/A  . Number of children: N/A  . Years of education: N/A   Occupational History  . retired    Social History Main Topics  . Smoking status: Former Research scientist (life sciences)  . Smokeless tobacco: Never Used     Comment: QUIT IN 2002  . Alcohol use No  . Drug use: No  . Sexual activity: Not on file   Other Topics Concern  . Not on file   Social History Narrative  . No narrative on file    Medications:   Current Outpatient Prescriptions on File Prior to Visit  Medication Sig Dispense Refill  . acetaminophen (TYLENOL) 500 MG tablet Take 1 tablet by mouth 2 (two) times daily as needed.     Marland Kitchen albuterol (VENTOLIN HFA) 108 (90 Base) MCG/ACT inhaler Inhale 2 puffs into the lungs every 6 (six) hours as needed. 3 Inhaler 3  . amLODipine (NORVASC) 5 MG tablet Take 1 tablet (5 mg total) by mouth daily. 90 tablet 0  . apixaban (ELIQUIS) 5 MG TABS tablet Take 1 tablet (5 mg total) by mouth 2 (two) times daily. 60 tablet 0  . atorvastatin  (LIPITOR) 40 MG tablet Take 1 tablet (40 mg total) by mouth daily at 6 PM. 30 tablet 0  . budesonide-formoterol (SYMBICORT) 160-4.5 MCG/ACT inhaler Inhale 2 puffs into the lungs 2 (two) times daily. 3 Inhaler 3  . Calcium Carb-Cholecalciferol (CALCIUM 600+D) 600-800 MG-UNIT TABS Take 2 tablets by mouth daily.    . clonazePAM (KLONOPIN) 0.5 MG tablet Take 1 tablet (0.5 mg total) by mouth 2 (two) times daily. 30 tablet 0  . docusate sodium (COLACE) 100 MG capsule Take 200 mg by mouth at bedtime.    . gabapentin (NEURONTIN) 100 MG capsule Take 1 capsule (100 mg total) by mouth 3 (three) times daily. 60 capsule 0  . PARoxetine (PAXIL) 20 MG tablet Take 20 mg by mouth daily.    Marland Kitchen tiotropium (SPIRIVA HANDIHALER) 18 MCG inhalation capsule Place 1 capsule (18 mcg total) into inhaler and inhale daily. 90 capsule 3   Current Facility-Administered Medications on File Prior to Visit  Medication Dose Route Frequency Provider Last Rate Last Dose  . ipratropium-albuterol (DUONEB) 0.5-2.5 (3) MG/3ML nebulizer solution 3 mL  3 mL Nebulization Q4H PRN Lytle Butte, MD        Allergies:   Allergies  Allergen Reactions  . Duloxetine Hcl     unknown  . Influenza Vaccines     unknown  . Venlafaxine     GI distress    Physical Exam General: well developed, well nourished, seated, in no evident distress Head: head normocephalic and atraumatic.  Neck: supple with no carotid or supraclavicular bruits Cardiovascular: regular rate and rhythm, no murmurs Musculoskeletal: no deformity Skin:  no rash/petichiae Vascular:  Normal pulses all extremities Vitals:   10/06/16 1413  BP: 124/75  Pulse: 80   Neurologic Exam Mental Status: Awake and fully alert. Moderate expressive and mild receptive aphasia. Can follow only simple midline and one-step commands. Unable to name or repeat. Speech is hesitant with short sentences and occasional paraphasic errors. Mood and affect appropriate.  Cranial Nerves: Fundoscopic  exam reveals sharp disc margins. Pupils equal, briskly reactive to light. Extraocular movements full without nystagmus. Visual fields show decreased blink on the right compared to the left to threat Hearing intact. Facial sensation intact. Face, tongue, palate moves normally and symmetrically.  Motor: Normal bulk and tone. Normal strength in all tested extremity muscles. Sensory.: intact to touch ,pinprick .position and vibratory sensation.  Coordination: Rapid alternating movements normal in all extremities. Finger-to-nose and heel-to-shin performed accurately bilaterally. Gait and Station: Arises from chair without difficulty. Stance is normal. Gait demonstrates normal stride length and balance . Able to heel, toe and tandem walk without difficulty.  Reflexes: 1+ and symmetric. Toes downgoing.   NIHSS  5 Modified Rankin  3  ASSESSMENT: 32 year Caucasian lady with left posterior division middle cerebral artery embolic infarct in January 2018 secondary to paradoxical embolism from deep vein thrombosis and patent foramen ovale. She has significant residual mixed expressive  greater than receptive aphasia. Vascular risk factors of hypertension, hyperlipidemia and patent foramen ovale.    PLAN: I had a long d/w patient and her 2 daughters about her recent embolic stroke, DVT, PFO,risk for recurrent stroke/TIAs, personally independently reviewed imaging studies and stroke evaluation results and answered questions.Continue Eliquis (apixaban) daily given acute DVT for secondary stroke prevention and maintain strict control of hypertension with blood pressure goal below 130/90, diabetes with hemoglobin A1c goal below 6.5% and lipids with LDL cholesterol goal below 70 mg/dL. I also advised the patient to eat a healthy diet with plenty of whole grains, cereals, fruits and vegetables, exercise regularly and maintain ideal body weight .She was also advised to continue ongoing speech therapy and fall and safety  precautions. Followup in the future with my nurse practitioner in 3 months or call earlier if necessary. Greater than 50% of time during this 30 minute visit was spent on counseling,explanation of diagnosis, planning of further management, discussion with patient and family and coordination of care Antony Contras, MD  [facility] [address] [place], [place] [address]  Phone [phone] Fax [phone] Note: This document was prepared with digital dictation and possible smart phrase technology. Any transcriptional errors that result from this process are unintentional

## 2016-10-06 NOTE — Progress Notes (Addendum)
Patient: Stephanie [name] Female    DOB: 07-Aug-1936   81 y.o.   MRN: [id number] Visit Date: 10/06/2016  Today's Provider: Vernie Murders, PA   Chief Complaint  Patient presents with  . Hospitalization Follow-up   Subjective:    HPI  Follow up Hospitalization  Patient was admitted to [facility] on 08/17/2016 and discharged on 08/22/2016 to a [facility] ([place]).   She was treated for  *HLD (hyperlipidemia)  *COPD, moderate (HCC) *Acute CVA (cerebrovascular accident) ([place])  *Elevated troponin  *Mild malnutrition (HCC)  *Expressive aphasia  *Cerebrovascular accident (CVA) due to embolism of left middle cerebral artery (HCC)  *DVT (deep venous thrombosis) (HCC)  *PFO (patent foramen ovale)   Treatment for this included started taking: apixaban 5 MG Tabs tablet  atorvastatin 40 MG tablet  gabapentin 100 MG capsule   Stopped taking: aspirin 325 MG EC tablet pravastatin 20 MG tablet    She reports good compliance with treatment.  She reports this condition is Improved, but patient's blood pressure is elevated. Yesterday the home health nurse called her BP and it was 170/90 the first time and 180/100 the second time. Patient started Amlodipine 5 mg yesterday which was prescribed by Fabio Bering.  Patient has a home health nurse in the home once a week, nad a speech therapist twice a week.  ------------------------------------------------------------------------------------  Past Medical History:  Diagnosis Date  . Arthritis   . Complication of anesthesia   . COPD (chronic obstructive pulmonary disease) ([place])   . Coronary artery disease 2013   stent  . Depression   . Edema   . Pneumonia    IN PAST  . PONV (postoperative nausea and vomiting)   . Stroke [facility])    Past Surgical History:  Procedure Laterality Date  . BALLOON ANGIOPLASTY, ARTERY  05/17/2009  . CATARACT EXTRACTION W/PHACO Left 05/24/2016   Procedure: CATARACT EXTRACTION PHACO AND  INTRAOCULAR LENS PLACEMENT (IOC);  Surgeon: Birder Robson, MD;  Location: [facility] ORS;  Service: Ophthalmology;  Laterality: Left;  Lot# [id number] H [place]:   00:52.3 AP%:   27.3 CDE:  14.26   . CATARACT EXTRACTION W/PHACO Right 06/14/2016   Procedure: CATARACT EXTRACTION PHACO AND INTRAOCULAR LENS PLACEMENT (IOC);  Surgeon: Birder Robson, MD;  Location: [facility] ORS;  Service: Ophthalmology;  Laterality: Right;  Lot# [id number] H [place]: 01:13.4 AP%: 24.4 CDE: 17.85  . CORONARY ANGIOPLASTY     STENT  . EP IMPLANTABLE DEVICE N/A 08/19/2016   Procedure: Loop Recorder Insertion;  Surgeon: Will Meredith Leeds, MD;  Location: [facility] CV LAB;  Service: Cardiovascular;  Laterality: N/A;   Family History  Problem Relation Age of Onset  . Hypertension Mother   . Diabetes Mother   . Pancreatic cancer Mother 23  . Cancer Mother   . Suicidality Father 67  . Hypertension Father   . Alcohol abuse Father   . Hypertension Sister   . Heart disease Sister   . Kidney disease Sister   . Varicose Veins Sister    Allergies  Allergen Reactions  . Duloxetine Hcl     unknown  . Influenza Vaccines     unknown  . Other     STEROIDS  DON'T WORK FOR HER  . Venlafaxine     GI distress      Previous Medications   ACETAMINOPHEN (TYLENOL) 500 MG TABLET    Take 1 tablet by mouth at bedtime.    ALBUTEROL (VENTOLIN HFA) 108 (90 BASE) MCG/ACT INHALER    Inhale 2  puffs into the lungs every 6 (six) hours as needed.   AMLODIPINE (NORVASC) 5 MG TABLET    Take 1 tablet (5 mg total) by mouth daily.   APIXABAN (ELIQUIS) 5 MG TABS TABLET    Take 1 tablet (5 mg total) by mouth 2 (two) times daily.   ATORVASTATIN (LIPITOR) 40 MG TABLET    Take 1 tablet (40 mg total) by mouth daily at 6 PM.   BUDESONIDE-FORMOTEROL (SYMBICORT) 160-4.5 MCG/ACT INHALER    Inhale 2 puffs into the lungs 2 (two) times daily.   CALCIUM CARB-CHOLECALCIFEROL (CALCIUM 600+D) 600-800 MG-UNIT TABS    Take 2 tablets by mouth daily.   CLONAZEPAM (KLONOPIN)  0.5 MG TABLET    Take 1 tablet (0.5 mg total) by mouth 2 (two) times daily.   DOCUSATE SODIUM (COLACE) 100 MG CAPSULE    Take 200 mg by mouth at bedtime.   GABAPENTIN (NEURONTIN) 100 MG CAPSULE    Take 1 capsule (100 mg total) by mouth 3 (three) times daily.   IBUPROFEN (ADVIL,MOTRIN) 200 MG TABLET    Take 400 mg by mouth daily as needed for headache or moderate pain.    PAROXETINE (PAXIL) 20 MG TABLET    Take 20 mg by mouth daily.   SENNA-DOCUSATE (SENOKOT-S) 8.6-50 MG TABLET    Take 1 tablet by mouth at bedtime as needed for mild constipation.   TIOTROPIUM (SPIRIVA HANDIHALER) 18 MCG INHALATION CAPSULE    Place 1 capsule (18 mcg total) into inhaler and inhale daily.    Review of Systems  Constitutional: Negative.   Eyes: Positive for visual disturbance.  Respiratory: Negative.   Cardiovascular: Negative.   Neurological: Positive for numbness.    Social History  Substance Use Topics  . Smoking status: Former Research scientist (life sciences)  . Smokeless tobacco: Never Used     Comment: QUIT IN 2002  . Alcohol use No   Objective:   BP (!) 170/82 (BP Location: Right Arm, Patient Position: Sitting, Cuff Size: Normal)   Pulse 82   Temp 97.9 F (36.6 C) (Oral)   Resp 16   Wt 125 lb (56.7 kg)   SpO2 90%   BMI 22.86 kg/m   Physical Exam  Constitutional: She is oriented to person, place, and time. She appears well-developed and well-nourished. No distress.  HENT:  Head: Normocephalic and atraumatic.  Right Ear: Hearing normal.  Left Ear: Hearing normal.  Nose: Nose normal.  Eyes: Conjunctivae and lids are normal. Right eye exhibits no discharge. Left eye exhibits no discharge. No scleral icterus.  Neck: Neck supple.  Cardiovascular: Normal rate.   Pulmonary/Chest: Effort normal and breath sounds normal. No respiratory distress.  Distant breath sounds. No wheezes or rales at the present.  Abdominal: Soft. Bowel sounds are normal.  Musculoskeletal: Normal range of motion.  Neurological: She is alert  and oriented to person, place, and time. She has normal reflexes.  Skin: Skin is intact. No lesion and no rash noted.  Psychiatric: She has a normal mood and affect. Her behavior is normal. Thought content normal.  Expressive aphasia secondary to stroke. Seems to understand and hear every conversation. When trying to respond, words are jumbled or unintelligible babble.      Assessment & Plan:     1. Acute CVA (cerebrovascular accident) ([place]) Slight left sided residual muscle weakness and decrease in vision secondary to CVA on 08-17-16. No numbness. Expressive aphasia persists. Will check labs for signs of electrolyte imbalance, infection or anemia. Recheck CBC and CMP. Continue  with home health nurse evaluation/assessment once a week and speech therapy twice a week. Followed by Dr. Melrose Nakayama (neurologist). Wearing Life Alert and seems to understand function. Recheck pending reports. - CBC with Differential/Platelet - Comprehensive metabolic panel  2. Hypertension, essential BP higher than when she was at Dr. Lannie Fields office on 10-06-16 for follow up of stroke with expressive aphasia. Frustrated with inability to communicate but seems to understand conversations well. Started on Amlodipine 5 mg yesterday. Daugher with her today Nance Pew is HCPOA) and understands instructions.  3. COPD, moderate ([place]) Lungs seem stable with pulse oximetry at 90%. Continue Spiriva 1 capsule by autohaler qd and add a Spacer to use on Symbicort and Ventolin-HFA inhalers for better inhaled medication dispersal/effect. Recheck prn. Followed by Dr. Raul Del (pulmonologist). - Spacer/Aero Chamber Mouthpiece MISC; Use spacer on inhalers for better administration.  Dispense: 1 each; Refill: 1

## 2016-10-06 NOTE — Telephone Encounter (Signed)
Thank you. Proceed with this plan.

## 2016-10-06 NOTE — Telephone Encounter (Signed)
FYI:: Sandy with [facility] called to report patient is receiving in home speech therapy that was ordered by Dr. Ouida Sills. Frequency is 2 X week for 4 weeks, and 1 X a week for 2 weeks. Then patient will be re-evaluated. Also patient will only be receiving one session this week due to OV here this morning.   Sandy's CB#[phone]  If needed

## 2016-10-07 ENCOUNTER — Telehealth: Payer: Self-pay

## 2016-10-07 LAB — COMPREHENSIVE METABOLIC PANEL
ALK PHOS: 86 IU/L (ref 39–117)
ALT: 13 IU/L (ref 0–32)
AST: 22 IU/L (ref 0–40)
Albumin/Globulin Ratio: 1.8 (ref 1.2–2.2)
Albumin: 4.5 g/dL (ref 3.5–4.8)
BUN / CREAT RATIO: 19 (ref 12–28)
BUN: 21 mg/dL (ref 8–27)
CHLORIDE: 98 mmol/L (ref 96–106)
CO2: 26 mmol/L (ref 18–29)
CREATININE: 1.09 mg/dL — AB (ref 0.57–1.00)
Calcium: 11.7 mg/dL — ABNORMAL HIGH (ref 8.7–10.3)
GFR calc Af Amer: 56 mL/min/{1.73_m2} — ABNORMAL LOW (ref >59)
GFR calc non Af Amer: 49 mL/min/{1.73_m2} — ABNORMAL LOW (ref >59)
GLUCOSE: 101 mg/dL — AB (ref 65–99)
Globulin, Total: 2.5 g/dL (ref 1.5–4.5)
Potassium: 4.7 mmol/L (ref 3.5–5.2)
Sodium: 142 mmol/L (ref 134–144)
Total Protein: 7 g/dL (ref 6.0–8.5)

## 2016-10-07 LAB — CBC WITH DIFFERENTIAL/PLATELET
BASOS ABS: 0 10*3/uL (ref 0.0–0.2)
Basos: 0 %
EOS (ABSOLUTE): 0.1 10*3/uL (ref 0.0–0.4)
Eos: 2 %
HEMOGLOBIN: 11.9 g/dL (ref 11.1–15.9)
Hematocrit: 35.4 % (ref 34.0–46.6)
Immature Grans (Abs): 0 10*3/uL (ref 0.0–0.1)
Immature Granulocytes: 0 %
LYMPHS ABS: 1.7 10*3/uL (ref 0.7–3.1)
Lymphs: 24 %
MCH: 31.6 pg (ref 26.6–33.0)
MCHC: 33.6 g/dL (ref 31.5–35.7)
MCV: 94 fL (ref 79–97)
MONOCYTES: 5 %
MONOS ABS: 0.4 10*3/uL (ref 0.1–0.9)
NEUTROS ABS: 4.7 10*3/uL (ref 1.4–7.0)
Neutrophils: 69 %
PLATELETS: 281 10*3/uL (ref 150–379)
RBC: 3.77 x10E6/uL (ref 3.77–5.28)
RDW: 14.7 % (ref 12.3–15.4)
WBC: 6.9 10*3/uL (ref 3.4–10.8)

## 2016-10-07 NOTE — Telephone Encounter (Signed)
Patient's daughter Butch Penny advised. Patient had neurology follow up appointment yesterday. Per daughter the appointment went well.

## 2016-10-07 NOTE — Telephone Encounter (Signed)
-----   Message from Margo Common, [place] sent at 10/07/2016 12:53 PM EST ----- All blood tests essentially normal except calcium elevated. No anemia or electrolyte imbalance. Normal blood sugar. Back down on calcium supplement to only one a day. Recheck progress in 3 months pending neurology follow up.

## 2016-10-10 DIAGNOSIS — I69320 Aphasia following cerebral infarction: Secondary | ICD-10-CM | POA: Diagnosis not present

## 2016-10-10 DIAGNOSIS — M1990 Unspecified osteoarthritis, unspecified site: Secondary | ICD-10-CM | POA: Diagnosis not present

## 2016-10-10 DIAGNOSIS — F329 Major depressive disorder, single episode, unspecified: Secondary | ICD-10-CM | POA: Diagnosis not present

## 2016-10-10 DIAGNOSIS — F419 Anxiety disorder, unspecified: Secondary | ICD-10-CM | POA: Diagnosis not present

## 2016-10-10 DIAGNOSIS — I69314 Frontal lobe and executive function deficit following cerebral infarction: Secondary | ICD-10-CM | POA: Diagnosis not present

## 2016-10-11 ENCOUNTER — Ambulatory Visit: Payer: PPO | Admitting: *Deleted

## 2016-10-11 ENCOUNTER — Other Ambulatory Visit: Payer: Self-pay | Admitting: *Deleted

## 2016-10-11 NOTE — Patient Outreach (Signed)
Transition of care call successful as received a return phone call from daughter Butch Penny (pt's HCPOA, on consent form), attempt made earlier by RN CM to daughter- not a good time to talk.  Pt's recent SNF discharge 2/23- admitted from hospital 1/15-2/9 for CVA.  Spoke with daughter, HIPAA verified on pt.  Daughter reports pt had an off day yesterday/problems sleeping, had difficulty working with Home health speech therapy.  Daughter reports pt's BP was elevated on 3/1 office visit with Vernie Murders PA but then down the same day with Dr. Leonie Man visit.   Daughter reports Dr. Leonie Man wants pt to remain on new BP medication. Daughter reports [place] checks pt's BP but does not know what it is because she is not there.  Daughter reports pt is eating good, staying by herself.    Plan:  As discussed with daughter, plan to follow up again next week telephonically (part of ongoing transition of care).             As discussed, daughter to request [facility] to  record BP readings.   Zara Chess.   [facility]  [phone]

## 2016-10-13 ENCOUNTER — Other Ambulatory Visit: Payer: Self-pay | Admitting: Family Medicine

## 2016-10-14 ENCOUNTER — Ambulatory Visit: Payer: Self-pay | Admitting: Family Medicine

## 2016-10-17 ENCOUNTER — Telehealth: Payer: Self-pay | Admitting: Family Medicine

## 2016-10-17 LAB — CUP PACEART REMOTE DEVICE CHECK
Date Time Interrogation Session: 20180312125959
MDC IDC PG IMPLANT DT: 20180112

## 2016-10-17 NOTE — Telephone Encounter (Signed)
Having aching pains in thighs since starting the Lipitor. Recommend she stop it for 5 days to see if it is the cause. If no better, should schedule recheck appointment.

## 2016-10-17 NOTE — Telephone Encounter (Signed)
Pt's daughter Butch Penny request a call back. Butch Penny stated that pt is having a lot of leg pain and tylenol isn't helping and wanted to see if the atorvastatin (LIPITOR) 40 MG tablet could be causing the pain if pt should cut her dosage. Butch Penny request a call back on her cell#. CB# [phone]. Please advise. Thanks TNP

## 2016-10-18 ENCOUNTER — Other Ambulatory Visit: Payer: Self-pay | Admitting: *Deleted

## 2016-10-18 ENCOUNTER — Ambulatory Visit (INDEPENDENT_AMBULATORY_CARE_PROVIDER_SITE_OTHER): Payer: PPO | Admitting: *Deleted

## 2016-10-18 DIAGNOSIS — I639 Cerebral infarction, unspecified: Secondary | ICD-10-CM | POA: Diagnosis not present

## 2016-10-18 NOTE — Patient Outreach (Signed)
Attempt made to contact pt's daughter Nance Pew [facility], on [facility] consent), ongoing follow up on pt's recent SNF discharge 2/23- admitted from hospital 1/15-2/9 for CVA.   HIPAA compliant voice message left with contact name and number.      Plan: if no response, will try again within next 3 days.    Zara Chess.   [place] [facility]  [phone]

## 2016-10-18 NOTE — Patient Outreach (Signed)
Received a return phone call from daughter Nance Pew (pt's HCPOA, on consent form, pt's preferred contact) in response to voice message left by RN CM earlier.  Spoke with daughter, HIPAA verified on pt.  Daughter reports pt having pain to top  of left leg since starting Lipitor, talked with Vernie Murders PA yesterday, told to hold medication for 5 days to see if better, if not have pt follow up.   Daughter reports the left leg is where pt had a  blood clot.  Daughter reports pt's BP is doing good, last reading taken by [facility] last week- result was 125/70.  Daughter reports pt has ups and downs with her speech,to see Dr. Raul Del this week,noticed speech affected thinking about this visit. .   Plan:  As discussed, plan to follow up again with daughter telephonically next week (part of ongoing transition of care).    Zara Chess.   [place] [facility]  [phone]

## 2016-10-19 NOTE — Progress Notes (Signed)
Carelink Summary Report / Loop Recorder 

## 2016-10-20 DIAGNOSIS — J439 Emphysema, unspecified: Secondary | ICD-10-CM | POA: Diagnosis not present

## 2016-10-21 ENCOUNTER — Telehealth: Payer: Self-pay

## 2016-10-21 ENCOUNTER — Ambulatory Visit: Payer: Self-pay | Admitting: *Deleted

## 2016-10-21 NOTE — Telephone Encounter (Signed)
Patient's daughter Butch Penny advised.

## 2016-10-21 NOTE — Telephone Encounter (Signed)
Cholesterol blood levels were so good in January 2018, let's not add anything now. Should recheck blood levels in 4-6 months.

## 2016-10-21 NOTE — Telephone Encounter (Signed)
Rerouting to the nurse-aa

## 2016-10-21 NOTE — Telephone Encounter (Signed)
Butch Penny, daughter, called and states that patient has stopped Lipitor and her pain/aches have improved and she wants to know what should patient take now since she can not take Lipitor due to the pain. CB [phone] to [facility]

## 2016-10-26 ENCOUNTER — Telehealth: Payer: Self-pay | Admitting: Family Medicine

## 2016-10-26 ENCOUNTER — Other Ambulatory Visit: Payer: Self-pay | Admitting: *Deleted

## 2016-10-26 NOTE — Patient Outreach (Signed)
Attempt made to contact pt's daughter Butch Penny [facility], on consent form) for ongoing follow up on pt's recent SNF discharge 2/23, admitted after recent hospitalization 1/15-2/9 for CVA.  HIPAA compliant voice message left with contact number.    Plan:  If no response, will follow up again telephonically.   Zara Chess.   [place] [facility]  [phone]

## 2016-10-26 NOTE — Telephone Encounter (Signed)
Butch Penny pt's daughter was advised as directed below.  Thanks,  -Alyscia Carmon

## 2016-10-26 NOTE — Telephone Encounter (Signed)
Can you please advise or review.Stephanie [name] is not here today.  Thanks,  -[name]

## 2016-10-26 NOTE — Patient Outreach (Signed)
Returned missed call to daughter Nance Pew (on consent form) who was responding to voice message left earlier by RN CM.  Spoke with daughter, HIPAA verified on pt, discussed purpose of call - ongoing follow up on pt's recent SNF discharge/recent hospitalization.   Daughter reports pt is doing good, Simona Huh Chrismon PA discontinued pt's Lipitor as labs (cholesterol) in January was good, to check again in 3-4 months.  Daughter reports pt continues with  home health speech therapy, some improvement seen.  Daughter reports on pt's visit with Dr. Raul Del- told lungs good/no changes.  Daughter reports pt's BP has been staying 120/70's.    Plan:  As discussed with pt's daughter, plan to follow up again next week for final transition of care call.     Zara Chess.   [place] [facility]  [phone]

## 2016-10-26 NOTE — Telephone Encounter (Signed)
Yes gabapentin can cause this if her fatigue and sleeping all the time correlate with when she was started or increased dosing on gabapentin. She can decrease to twice daily to see if any improvement. This can also be secondary to the CVA with brain healing. Recommend following up with Simona Huh in the coming weeks if fatigue persists.

## 2016-10-26 NOTE — Telephone Encounter (Signed)
Pt daughter, Butch Penny called concerned that pt feels tired all time and sleeping a lot during the afternoon.  She is asking if the Rx gabapentin (NEURONTIN) 100 MG capsule can cause this or if can pt can change the amount she is taking?  CB#[phone]

## 2016-10-26 NOTE — Telephone Encounter (Signed)
[place] number.  Thanks,  -Jedaiah Rathbun

## 2016-10-27 LAB — CUP PACEART REMOTE DEVICE CHECK
Implantable Pulse Generator Implant Date: 20180112
MDC IDC SESS DTM: 20180322181005

## 2016-11-01 DIAGNOSIS — I69320 Aphasia following cerebral infarction: Secondary | ICD-10-CM | POA: Diagnosis not present

## 2016-11-01 DIAGNOSIS — M1990 Unspecified osteoarthritis, unspecified site: Secondary | ICD-10-CM | POA: Diagnosis not present

## 2016-11-01 DIAGNOSIS — F329 Major depressive disorder, single episode, unspecified: Secondary | ICD-10-CM | POA: Diagnosis not present

## 2016-11-01 DIAGNOSIS — F419 Anxiety disorder, unspecified: Secondary | ICD-10-CM | POA: Diagnosis not present

## 2016-11-03 ENCOUNTER — Ambulatory Visit: Payer: PPO | Admitting: *Deleted

## 2016-11-03 ENCOUNTER — Telehealth: Payer: Self-pay | Admitting: Family Medicine

## 2016-11-03 ENCOUNTER — Other Ambulatory Visit: Payer: Self-pay | Admitting: *Deleted

## 2016-11-03 NOTE — Telephone Encounter (Signed)
Give verbal order to proceed with 8 speech therapy visits for expressive aphasia secondary to CVA.

## 2016-11-03 NOTE — Telephone Encounter (Signed)
Katharine Look with [facility] is requesting a verbal order for 8 speech therapy visits.  She states she will be seeing pt today and is requesting this today if possible.  FV#[phone]/KD

## 2016-11-03 NOTE — Patient Outreach (Signed)
Final transition of care call completed, ongoing follow up on recent SNF discharge 2/23, admitted from hospital- recent hospitalization 1/15-2/9 for CVA.  Spoke with pt's daughter Nance Pew (on consent form), HIPAA verified.   Daughter reports pt is doing good, [place] reaching out to get more visits approved.  Daughter reports pt's BP is staying good, Gabapentin was cut down from three times a day to twice, am and pm as pt was sleeping after lunch- improvement seen since decrease.  Daughter reports concern over pt not taking a cholesterol medication (pt unable to tolerate), was told by Vernie Murders last cholesterol result good, to recheck at next visit, pt to see Neurologist and Vernie Murders  In June.   RN CM discussed with daughter pt's  adherence to Low cholesterol Heart Healthy diet,  to call MD office if still concerned  to which daughter voiced understanding.   Plan:  As discussed with daughter, today final transition of care call.  Plan to follow up again next month telephonically check on pt's status.    Zara Chess.   [place] [facility]  [phone]

## 2016-11-03 NOTE — Telephone Encounter (Signed)
Verbal okay given to [facility] with [facility].

## 2016-11-07 DIAGNOSIS — F329 Major depressive disorder, single episode, unspecified: Secondary | ICD-10-CM | POA: Diagnosis not present

## 2016-11-07 DIAGNOSIS — M1990 Unspecified osteoarthritis, unspecified site: Secondary | ICD-10-CM | POA: Diagnosis not present

## 2016-11-07 DIAGNOSIS — F419 Anxiety disorder, unspecified: Secondary | ICD-10-CM | POA: Diagnosis not present

## 2016-11-07 DIAGNOSIS — I69314 Frontal lobe and executive function deficit following cerebral infarction: Secondary | ICD-10-CM | POA: Diagnosis not present

## 2016-11-07 DIAGNOSIS — I69320 Aphasia following cerebral infarction: Secondary | ICD-10-CM | POA: Diagnosis not present

## 2016-11-08 ENCOUNTER — Ambulatory Visit (INDEPENDENT_AMBULATORY_CARE_PROVIDER_SITE_OTHER): Payer: PPO | Admitting: Family Medicine

## 2016-11-08 ENCOUNTER — Encounter: Payer: Self-pay | Admitting: Family Medicine

## 2016-11-08 VITALS — BP 116/72 | HR 74 | Temp 97.9°F | Wt 120.2 lb

## 2016-11-08 DIAGNOSIS — M79605 Pain in left leg: Secondary | ICD-10-CM | POA: Diagnosis not present

## 2016-11-08 DIAGNOSIS — R4701 Aphasia: Secondary | ICD-10-CM | POA: Diagnosis not present

## 2016-11-08 DIAGNOSIS — I63412 Cerebral infarction due to embolism of left middle cerebral artery: Secondary | ICD-10-CM

## 2016-11-08 MED ORDER — GABAPENTIN 100 MG PO CAPS: 100.0000 mg | ORAL_CAPSULE | Freq: Three times a day (TID) | ORAL | 3 refills | Status: DC

## 2016-11-08 MED ORDER — APIXABAN 5 MG PO TABS: 5.0000 mg | ORAL_TABLET | Freq: Two times a day (BID) | ORAL | 3 refills | Status: DC

## 2016-11-08 NOTE — Progress Notes (Addendum)
Patient: Stephanie [name] Female    DOB: [birth year]   81 y.o.   MRN: [id number] Visit Date: 11/08/2016  Today's Provider: Vernie Murders, PA   Chief Complaint  Patient presents with  . Leg Pain   Subjective:    Leg Pain   Incident onset: 1 month ago. There was no injury mechanism. The pain is present in the left leg. The quality of the pain is described as aching. The pain is at a severity of 6/10. The pain has been intermittent since onset. The symptoms are aggravated by movement. Treatments tried: stopped taking Lipitor and pain subsided but then returned.   Patient Active Problem List   Diagnosis Date Noted  . PFO (patent foramen ovale)   . DVT (deep venous thrombosis) ([place])   . Cerebrovascular accident (CVA) due to embolism of left middle cerebral artery ([place])   . Expressive aphasia   . Elevated troponin 08/18/2016  . Mild malnutrition ([place]) 08/18/2016  . Acute CVA (cerebrovascular accident) ([place]) 08/17/2016  . Acute exacerbation of chronic obstructive pulmonary disease (COPD) ([place]) 07/19/2016  . Depression 05/03/2016  . Mild dementia 04/19/2016  . COPD, moderate ([place]) 03/05/2015  . Anxiety and depression 03/04/2015  . Arteriosclerosis of coronary artery 03/04/2015  . CAFL (chronic airflow limitation) ([place]) 03/04/2015  . Breathlessness on exertion 03/04/2015  . HLD (hyperlipidemia) 03/04/2015  . Osteopenia 03/04/2015  . Peptic ulcer 03/04/2015  . Amnesia 03/20/2014  . Disordered sleep 03/20/2014  . Allergy to environmental factors 11/14/2013  . Gastroduodenal ulcer 11/14/2013  . GI bleed 11/14/2013   Past Surgical History:  Procedure Laterality Date  . BALLOON ANGIOPLASTY, ARTERY  05/17/2009  . CATARACT EXTRACTION W/PHACO Left 05/24/2016   Procedure: CATARACT EXTRACTION PHACO AND INTRAOCULAR LENS PLACEMENT (IOC);  Surgeon: Birder Robson, MD;  Location: [facility] ORS;  Service: Ophthalmology;  Laterality: Left;  Lot# [id number] H [place]:   00:52.3 AP%:   27.3 CDE:   14.26   . CATARACT EXTRACTION W/PHACO Right 06/14/2016   Procedure: CATARACT EXTRACTION PHACO AND INTRAOCULAR LENS PLACEMENT (IOC);  Surgeon: Birder Robson, MD;  Location: [facility] ORS;  Service: Ophthalmology;  Laterality: Right;  Lot# [id number] H [place]: 01:13.4 AP%: 24.4 CDE: 17.85  . CORONARY ANGIOPLASTY     STENT  . EP IMPLANTABLE DEVICE N/A 08/19/2016   Procedure: Loop Recorder Insertion;  Surgeon: Will Meredith Leeds, MD;  Location: [place] [facility];  Service: Cardiovascular;  Laterality: N/A;   Family History  Problem Relation Age of Onset  . Hypertension Mother   . Diabetes Mother   . Pancreatic cancer Mother 40  . Cancer Mother   . Suicidality Father 102  . Hypertension Father   . Alcohol abuse Father   . Hypertension Sister   . Heart disease Sister   . Kidney disease Sister   . Varicose Veins Sister    Allergies  Allergen Reactions  . Duloxetine Hcl     unknown  . Influenza Vaccines     unknown  . Venlafaxine     GI distress     Previous Medications   ACETAMINOPHEN (TYLENOL) 500 MG TABLET    Take 1 tablet by mouth 2 (two) times daily as needed.    ALBUTEROL (VENTOLIN HFA) 108 (90 BASE) MCG/ACT INHALER    Inhale 2 puffs into the lungs every 6 (six) hours as needed.   AMLODIPINE (NORVASC) 5 MG TABLET    Take 1 tablet (5 mg total) by mouth daily.   APIXABAN (ELIQUIS) 5 MG TABS TABLET  Take 1 tablet (5 mg total) by mouth 2 (two) times daily.   ATORVASTATIN (LIPITOR) 40 MG TABLET    Take 1 tablet (40 mg total) by mouth daily at 6 PM.   BUDESONIDE-FORMOTEROL (SYMBICORT) 160-4.5 MCG/ACT INHALER    Inhale 2 puffs into the lungs 2 (two) times daily.   CALCIUM CARB-CHOLECALCIFEROL (CALCIUM 600+D) 600-800 MG-UNIT TABS    Take 2 tablets by mouth daily.   CLONAZEPAM (KLONOPIN) 0.5 MG TABLET    Take 1 tablet (0.5 mg total) by mouth 2 (two) times daily.   DOCUSATE SODIUM (COLACE) 100 MG CAPSULE    Take 200 mg by mouth at bedtime.   GABAPENTIN (NEURONTIN) 100 MG CAPSULE    Take 1  capsule (100 mg total) by mouth 3 (three) times daily.   PAROXETINE (PAXIL) 10 MG TABLET    TAKE TWO TABLETS BY MOUTH AT BEDTIME DAILY   SPACER/AERO CHAMBER MOUTHPIECE MISC    Use spacer on inhalers for better administration.   TIOTROPIUM (SPIRIVA HANDIHALER) 18 MCG INHALATION CAPSULE    Place 1 capsule (18 mcg total) into inhaler and inhale daily.    Review of Systems  Constitutional: Negative.   Respiratory: Negative.   Cardiovascular: Negative.   Musculoskeletal:       Left leg pain     Social History  Substance Use Topics  . Smoking status: Former Research scientist (life sciences)  . Smokeless tobacco: Never Used     Comment: QUIT IN 2002  . Alcohol use No   Objective:   BP 116/72 (BP Location: Right Arm, Patient Position: Sitting, Cuff Size: Normal)   Pulse 74   Temp 97.9 F (36.6 C) (Oral)   Wt 120 lb 3.2 oz (54.5 kg)   SpO2 94%   BMI 21.98 kg/m   Physical Exam  Constitutional: She is oriented to person, place, and time. She appears well-developed and well-nourished. No distress.  HENT:  Head: Normocephalic and atraumatic.  Right Ear: Hearing normal.  Left Ear: Hearing normal.  Nose: Nose normal.  Eyes: Conjunctivae and lids are normal. Right eye exhibits no discharge. Left eye exhibits no discharge. No scleral icterus.  Pulmonary/Chest: Effort normal. No respiratory distress.  Musculoskeletal: Normal range of motion.  Neurological: She is alert and oriented to person, place, and time.  Aphasia with only slight change since starting home speech therapy. Understands well but unable to keep the brain to speech connection to use the correct works or keep from babbling unintelligably.  Skin: Skin is intact. No lesion and no rash noted.  Psychiatric: She has a normal mood and affect. Her behavior is normal. Thought content normal.      Assessment & Plan:     1. Cerebrovascular accident (CVA) due to embolism of left middle cerebral artery ([place]) CVA occurred on 08-17-16 and suspected secondary  to paradoxical embolism from DVT and patent foramen ovale per Dr. Leonie Man (neurologist). Strength in the left side of the body seems improve but aphasia slow to change. Presently on Eliquis 5 mg BID and Gabapentin 100 mg TID. Continue home health nurse and physical therapy sessions. Follow up with Dr. Leonie Man as scheduled. Daughter with her today and understands instruction. - gabapentin (NEURONTIN) 100 MG capsule; Take 1 capsule (100 mg total) by mouth 3 (three) times daily.  Dispense: 90 capsule; Refill: 3 - apixaban (ELIQUIS) 5 MG TABS tablet; Take 1 tablet (5 mg total) by mouth 2 (two) times daily.  Dispense: 60 tablet; Refill: 3  2. Expressive aphasia Still some babble with  mix up of works from [place]. Slight improvement and continues speech therapy at home.  - gabapentin (NEURONTIN) 100 MG capsule; Take 1 capsule (100 mg total) by mouth 3 (three) times daily.  Dispense: 90 capsule; Refill: 3 - apixaban (ELIQUIS) 5 MG TABS tablet; Take 1 tablet (5 mg total) by mouth 2 (two) times daily.  Dispense: 60 tablet; Refill: 3  3. Left leg pain Onset a month ago and improved when she stopped the Lipitor. Lipid panel on 08-18-16 showed HDL 102 and LDL 99 with VLDL 20. May hold the Lipitor and recheck in 2 months to see if Lipitor needed and that leg discomfort does not return.

## 2016-11-15 DIAGNOSIS — F419 Anxiety disorder, unspecified: Secondary | ICD-10-CM | POA: Diagnosis not present

## 2016-11-15 DIAGNOSIS — I69314 Frontal lobe and executive function deficit following cerebral infarction: Secondary | ICD-10-CM | POA: Diagnosis not present

## 2016-11-15 DIAGNOSIS — M1990 Unspecified osteoarthritis, unspecified site: Secondary | ICD-10-CM | POA: Diagnosis not present

## 2016-11-15 DIAGNOSIS — I69320 Aphasia following cerebral infarction: Secondary | ICD-10-CM | POA: Diagnosis not present

## 2016-11-15 DIAGNOSIS — F329 Major depressive disorder, single episode, unspecified: Secondary | ICD-10-CM | POA: Diagnosis not present

## 2016-11-17 ENCOUNTER — Ambulatory Visit (INDEPENDENT_AMBULATORY_CARE_PROVIDER_SITE_OTHER): Payer: PPO | Admitting: *Deleted

## 2016-11-17 DIAGNOSIS — I639 Cerebral infarction, unspecified: Secondary | ICD-10-CM | POA: Diagnosis not present

## 2016-11-18 NOTE — Progress Notes (Signed)
Carelink Summary Report / Loop Recorder 

## 2016-11-25 DIAGNOSIS — H169 Unspecified keratitis: Secondary | ICD-10-CM | POA: Diagnosis not present

## 2016-11-28 LAB — CUP PACEART REMOTE DEVICE CHECK
Implantable Pulse Generator Implant Date: 20180112
MDC IDC SESS DTM: 20180423112930

## 2016-12-02 ENCOUNTER — Telehealth: Payer: Self-pay | Admitting: Family Medicine

## 2016-12-02 NOTE — Telephone Encounter (Signed)
Give verbal order for home health visit once a week for 6 weeks to follow Expressive Aphasia secondary to Stroke.

## 2016-12-02 NOTE — Telephone Encounter (Signed)
Katharine Look calling from [facility] to get verbal order for pt to get home health for 1 (once )  a week for 6 weeks. Please return Sandra's call at [phone].  Thanks CC

## 2016-12-02 NOTE — Telephone Encounter (Signed)
Please review-aa 

## 2016-12-02 NOTE — Telephone Encounter (Signed)
Verbal order given to [facility] for speech therapy.

## 2016-12-05 ENCOUNTER — Telehealth: Payer: Self-pay | Admitting: Family Medicine

## 2016-12-05 ENCOUNTER — Other Ambulatory Visit: Payer: Self-pay | Admitting: *Deleted

## 2016-12-05 ENCOUNTER — Other Ambulatory Visit: Payer: Self-pay | Admitting: Family Medicine

## 2016-12-05 DIAGNOSIS — J449 Chronic obstructive pulmonary disease, unspecified: Secondary | ICD-10-CM

## 2016-12-05 MED ORDER — ALBUTEROL SULFATE HFA 108 (90 BASE) MCG/ACT IN AERS: 2.0000 | INHALATION_SPRAY | Freq: Four times a day (QID) | RESPIRATORY_TRACT | 3 refills | Status: DC | PRN

## 2016-12-05 NOTE — Patient Outreach (Signed)
Follow up phone call successful, followed pt for transition of care/completed 3/29 (recent SNF discharge 2/23, admitted after hospitalization 1/15-2/9 for CVA).   Spoke with daughter Butch Penny (on consent form), HIPAA verified on pt.  Daughter reports pt's speech has improved a little bit, still having difficulty with time/date, HH ST still coming once a week.  Daughter reports pt's BP is great, staying at 110/78, continues on lower dose of BP medication.  Daughter reports pt continues to stay alone, daughters check on her daily.  RN CM discussed with daughter plan to close case, goals met, if needs arise in the future for pt- to follow up with pt's Primary Care MD to refer back to [facility] or call [facility]'s main office number to which daughter voiced understanding.   Plan:  As discussed with daughter, plan to discharge pt from community nurse case management services- goals met.           Plan to inform Vernie Murders PA of discharge- case closure letter to be sent.            Plan to  Inform [facility] care management assistant to close case.    Zara Chess.   [place] [facility]  [phone]

## 2016-12-05 NOTE — Telephone Encounter (Signed)
Sent refill to her pharmacy. If using the albuterol (Proventil or Ventolin) too frequently, jitters and fast heartbeat will happen. Should recheck in the office if having more breathing issue. May need referral to pulmonologist if no better.

## 2016-12-05 NOTE — Telephone Encounter (Signed)
OK to ask pharmacy to give her the Proventil HFA. Spacers should work with Proventil, Ventolin or the [employer] (all are the Albuterol-HFA ).

## 2016-12-05 NOTE — Telephone Encounter (Signed)
Pt needs refill on Proventil AERHFA 6.7 aers  Pt satated that the other rx she was using made her [place]  Daughters call back [phone]  [facility]

## 2016-12-05 NOTE — Telephone Encounter (Signed)
Patient's daughter advised RX for ventolin was sent to [facility]. [place] to switch Ventolin to Proventil. Pharmacist states they don't carry Proventil so she switched it to [place].   Patient had a previous RX for Proventil that was given when she was at the rehab center, and they use a different pharmacy that is based in [place].

## 2016-12-05 NOTE — Telephone Encounter (Signed)
Spoke with patient's daughter Butch Penny. She states patient has been using Ventolin HFA BID as far as they know. Patient states Proventil HFA works better for her and she is able to use her spacer with that inhaler unlike with Ventolin HFA.   Butch Penny states patient is not having more breathing problems and she has followed up with Dr. Raul Del, and he was satisfied with her breathing.  Please advise if okay to switch from Ventolin HFA to Proventil HFA.

## 2016-12-06 ENCOUNTER — Encounter: Payer: Self-pay | Admitting: *Deleted

## 2016-12-08 DIAGNOSIS — F329 Major depressive disorder, single episode, unspecified: Secondary | ICD-10-CM | POA: Diagnosis not present

## 2016-12-08 DIAGNOSIS — I69320 Aphasia following cerebral infarction: Secondary | ICD-10-CM | POA: Diagnosis not present

## 2016-12-08 DIAGNOSIS — I69314 Frontal lobe and executive function deficit following cerebral infarction: Secondary | ICD-10-CM | POA: Diagnosis not present

## 2016-12-08 DIAGNOSIS — M1990 Unspecified osteoarthritis, unspecified site: Secondary | ICD-10-CM | POA: Diagnosis not present

## 2016-12-08 DIAGNOSIS — F419 Anxiety disorder, unspecified: Secondary | ICD-10-CM | POA: Diagnosis not present

## 2016-12-15 ENCOUNTER — Ambulatory Visit (INDEPENDENT_AMBULATORY_CARE_PROVIDER_SITE_OTHER): Payer: PPO | Admitting: Neurology

## 2016-12-15 ENCOUNTER — Encounter: Payer: Self-pay | Admitting: Neurology

## 2016-12-15 VITALS — BP 160/72 | HR 65 | Wt 114.0 lb

## 2016-12-15 DIAGNOSIS — I639 Cerebral infarction, unspecified: Secondary | ICD-10-CM

## 2016-12-15 DIAGNOSIS — R410 Disorientation, unspecified: Secondary | ICD-10-CM

## 2016-12-15 NOTE — Patient Instructions (Signed)
I had a long d/w patient  and her- daughters about his recent cryptogenic stroke, risk for recurrent stroke/TIAs, personally independently reviewed imaging studies and stroke evaluation results and answered questions.Continue Eliquis (apixaban) daily  for secondary stroke prevention given recent DVT and maintain strict control of hypertension with blood pressure goal below 130/90, diabetes with hemoglobin A1c goal below 6.5% and lipids with LDL cholesterol goal below 70 mg/dL. I also advised the patient to eat a healthy diet with plenty of whole grains, cereals, fruits and vegetables, exercise regularly and maintain ideal body weight .check repeat lower extremity venous Dopplers and if DVT has resolved may discontinue eliquis and change to aspirin after 6 months of anticoagulation. Check urinalysis, CMP, CBC and CT scan of the head given recent episode of confusion Followup in the future with my nurse practitioner in 6 months or call earlier if necessary

## 2016-12-15 NOTE — Progress Notes (Signed)
[facility] [address] [place]. [place] [address] [phone]       OFFICE FOLLOW-UP NOTE  Stephanie. Stephanie [name] Date of Birth:  09-21-1935 Medical Record Number:  [id number]   HPI: Stephanie [name] is a 81 year old pleasant Caucasian lady who is seen today for first office follow-up visit following hospital admission for stroke in January 2018. She is accompanied by 2 of her daughters provide most of the history which is also obtained from [facility] records and have personally reviewed imaging films.Stephanie [name] is an 81 y.o. female with hx as below was last seen normal 08/16/16 night and presented when daughter found her with expressive aphasia and L sided weakness. CTH showed acute infract in the L temp and parietal cortex region. Patient was not administered IV TPA secondary to delay in arrival. Patient was found to have right-sided visual field cut and expressive aphasia. MRA of the brain showed multifocal moderate stenosis and bilateral middle cerebral artery and M2 segments. Carotid ultrasound showed no significant extracranial stenosis. Transthoracic echo showed normal ejection fraction. Transesophageal echocardiogram was attempted but could not be performed due to difficulty in passing the probe. Transcanal Doppler bubble study showed high intensity transient signals suggestive of large patent foramen ovale. Lower extremity venous Dopplers showed subacute deep vein thrombosis involving the proximal popliteal vein in the left leg as well as superficial thrombosis in the left greater than right saphenous vein from the mid to proximal calf.h. Patient was started on eliquis. Hemoglobin A1c was 5.7 and LDL cholesterol was 77 mg percent. She was started on Lipitor 40 mg. She was transferred for rehabilitation. She is currently at home and getting speech therapy. Speech is improving but she is still struggling nonfluent speech. She is living by herself the  daughters check on her frequently and she spends the night alone. She was doing better until a week ago when the daughters were concerned that she was found to be more confused as well as agitated. They called for an urgent appointment to see me but feels it may have settled down in the last day or 2 she is doing better. She has not had any testing or evaluation done for to evaluate this episode of confusion. The patient has a loop recorder implanted and so for paroxysmal atrial fibrillation has not yet been detected.     ROS:   14 system review of systems is positive for  confusion, agitation, depression, speech and language difficulties and all other systems negative  PMH:  Past Medical History:  Diagnosis Date  . Arthritis   . Complication of anesthesia   . COPD (chronic obstructive pulmonary disease) ([place])   . Coronary artery disease 2013   stent  . Depression   . Edema   . Pneumonia    IN PAST  . PONV (postoperative nausea and vomiting)   . Stroke [facility])     Social History:  Social History   Social History  . Marital status: Widowed    Spouse name: N/A  . Number of children: N/A  . Years of education: N/A   Occupational History  . retired    Social History Main Topics  . Smoking status: Former Research scientist (life sciences)  . Smokeless tobacco: Never Used     Comment: QUIT IN 2002  . Alcohol use No  . Drug use: No  . Sexual activity: Not on file   Other Topics Concern  . Not on file   Social History Narrative  . No  narrative on file    Medications:   Current Outpatient Prescriptions on File Prior to Visit  Medication Sig Dispense Refill  . acetaminophen (TYLENOL) 500 MG tablet Take 1 tablet by mouth 2 (two) times daily as needed.     Marland Kitchen albuterol (VENTOLIN HFA) 108 (90 Base) MCG/ACT inhaler Inhale 2 puffs into the lungs every 6 (six) hours as needed. 3 Inhaler 3  . amLODipine (NORVASC) 5 MG tablet Take 1 tablet (5 mg total) by mouth daily. 90 tablet 0  . apixaban (ELIQUIS) 5 MG  TABS tablet Take 1 tablet (5 mg total) by mouth 2 (two) times daily. 60 tablet 3  . budesonide-formoterol (SYMBICORT) 160-4.5 MCG/ACT inhaler Inhale 2 puffs into the lungs 2 (two) times daily. 3 Inhaler 3  . Calcium Carb-Cholecalciferol (CALCIUM 600+D) 600-800 MG-UNIT TABS Take 2 tablets by mouth daily.    . clonazePAM (KLONOPIN) 0.5 MG tablet Take 1 tablet (0.5 mg total) by mouth 2 (two) times daily. 30 tablet 0  . docusate sodium (COLACE) 100 MG capsule Take 200 mg by mouth at bedtime.    . gabapentin (NEURONTIN) 100 MG capsule Take 1 capsule (100 mg total) by mouth 3 (three) times daily. (Patient taking differently: Take 100 mg by mouth 2 (two) times daily. ) 90 capsule 3  . PARoxetine (PAXIL) 10 MG tablet TAKE TWO TABLETS BY MOUTH AT BEDTIME DAILY 60 tablet 3  . Spacer/Aero Chamber Mouthpiece MISC Use spacer on inhalers for better administration. 1 each 1  . tiotropium (SPIRIVA HANDIHALER) 18 MCG inhalation capsule Place 1 capsule (18 mcg total) into inhaler and inhale daily. 90 capsule 3  . atorvastatin (LIPITOR) 40 MG tablet Take 1 tablet (40 mg total) by mouth daily at 6 PM. (Patient not taking: Reported on 12/15/2016) 30 tablet 0   Current Facility-Administered Medications on File Prior to Visit  Medication Dose Route Frequency Provider Last Rate Last Dose  . ipratropium-albuterol (DUONEB) 0.5-2.5 (3) MG/3ML nebulizer solution 3 mL  3 mL Nebulization Q4H PRN Hower, Aaron Mose, MD        Allergies:   Allergies  Allergen Reactions  . Duloxetine Hcl     unknown  . Influenza Vaccines     unknown  . Venlafaxine     GI distress    Physical Exam General: Frail pleasant elderly Caucasian lady seated, in no evident distress Head: head normocephalic and atraumatic.  Neck: supple with no carotid or supraclavicular bruits Cardiovascular: regular rate and rhythm, no murmurs Musculoskeletal: no deformity Skin:  no rash/petichiae Vascular:  Normal pulses all extremities Vitals:   12/15/16  1635  BP: (!) 160/72  Pulse: 65   Neurologic Exam Mental Status: Awake and fully alert. Moderate expressive and mild receptive aphasia. Nonfluent speech can speak a few words and occasional short sentences but there is a lot of need for infants. Paraphasic errors and word substitutions present. Can follow simple midline and one-step commands. Cranial Nerves: Fundoscopic exam reveals sharp disc margins. Pupils equal, briskly reactive to light. Extraocular movements full without nystagmus. Visual fields full to confrontation. Hearing intact. Facial sensation intact. Face, tongue, palate moves normally and symmetrically.  Motor: Normal bulk and tone. Normal strength in all tested extremity muscles. Sensory.: intact to touch ,pinprick .position and vibratory sensation.  Coordination: Rapid alternating movements normal in all extremities. Finger-to-nose and heel-to-shin performed accurately bilaterally. Gait and Station: Arises from chair without difficulty. Stance is normal. Gait demonstrates normal stride length and balance . Able to heel, toe and tandem walk  without difficulty.  Reflexes: 1+ and symmetric. Toes downgoing.   NIHSS 6 Modified Rankin  3   ASSESSMENT: 42 year Caucasian lady with left middle cerebral artery branch infarct in January 7824 of embolic etiology likely from paradoxical embolism from patent foramen ovale in the presence of acute deep vein thrombosis in the leg. Vascular risk factors of hypertension and hyperlipidemia and patent foramen ovale she has significant residual aphasia. Recent episode of transient confusion of unclear etiology which needs evaluation.    PLAN: I had a long d/w patient  and her- daughters about his recent cryptogenic stroke, risk for recurrent stroke/TIAs, personally independently reviewed imaging studies and stroke evaluation results and answered questions.Continue Eliquis (apixaban) daily  for secondary stroke prevention given recent DVT and  maintain strict control of hypertension with blood pressure goal below 130/90, diabetes with hemoglobin A1c goal below 6.5% and lipids with LDL cholesterol goal below 70 mg/dL. I also advised the patient to eat a healthy diet with plenty of whole grains, cereals, fruits and vegetables, exercise regularly and maintain ideal body weight .check repeat lower extremity venous Dopplers and if DVT has resolved may discontinue eliquis and change to aspirin after 6 months of anticoagulation. Check urinalysis, CMP, CBC and CT scan of the head given recent episode of confusion Followup in the future with my nurse practitioner in 6 months or call earlier if necessary Greater than 50% of time during this 35 minute visit was spent on counseling,explanation of diagnosis, planning of further management, discussion with patient and family and coordination of care Antony Contras, MD  [facility] [address] [place], [place] [address]  Phone [phone] Fax [phone] Note: This document was prepared with digital dictation and possible smart phrase technology. Any transcriptional errors that result from this process are unintentional

## 2016-12-16 LAB — CBC WITH DIFFERENTIAL/PLATELET
Basophils Absolute: 0 10*3/uL (ref 0.0–0.2)
Basos: 1 %
EOS (ABSOLUTE): 0.1 10*3/uL (ref 0.0–0.4)
EOS: 2 %
HEMATOCRIT: 36.8 % (ref 34.0–46.6)
HEMOGLOBIN: 12 g/dL (ref 11.1–15.9)
IMMATURE GRANS (ABS): 0 10*3/uL (ref 0.0–0.1)
IMMATURE GRANULOCYTES: 0 %
LYMPHS ABS: 1.9 10*3/uL (ref 0.7–3.1)
LYMPHS: 28 %
MCH: 30.3 pg (ref 26.6–33.0)
MCHC: 32.6 g/dL (ref 31.5–35.7)
MCV: 93 fL (ref 79–97)
MONOCYTES: 9 %
Monocytes Absolute: 0.6 10*3/uL (ref 0.1–0.9)
Neutrophils Absolute: 4.1 10*3/uL (ref 1.4–7.0)
Neutrophils: 60 %
Platelets: 288 10*3/uL (ref 150–379)
RBC: 3.96 x10E6/uL (ref 3.77–5.28)
RDW: 14.8 % (ref 12.3–15.4)
WBC: 6.7 10*3/uL (ref 3.4–10.8)

## 2016-12-16 LAB — URINALYSIS, ROUTINE W REFLEX MICROSCOPIC
Bilirubin, UA: NEGATIVE
Glucose, UA: NEGATIVE
KETONES UA: NEGATIVE
LEUKOCYTES UA: NEGATIVE
NITRITE UA: NEGATIVE
Protein, UA: NEGATIVE
RBC, UA: NEGATIVE
Specific Gravity, UA: 1.006 (ref 1.005–1.030)
Urobilinogen, Ur: 0.2 mg/dL (ref 0.2–1.0)
pH, UA: 6.5 (ref 5.0–7.5)

## 2016-12-16 LAB — COMPREHENSIVE METABOLIC PANEL
ALT: 14 IU/L (ref 0–32)
AST: 20 IU/L (ref 0–40)
Albumin/Globulin Ratio: 2.1 (ref 1.2–2.2)
Albumin: 4.5 g/dL (ref 3.5–4.8)
Alkaline Phosphatase: 82 IU/L (ref 39–117)
BUN / CREAT RATIO: 15 (ref 12–28)
BUN: 20 mg/dL (ref 8–27)
Bilirubin Total: <0.2 mg/dL (ref 0.0–1.2)
CO2: 26 mmol/L (ref 18–29)
CREATININE: 1.35 mg/dL — AB (ref 0.57–1.00)
Calcium: 10.3 mg/dL (ref 8.7–10.3)
Chloride: 101 mmol/L (ref 96–106)
GFR calc non Af Amer: 38 mL/min/{1.73_m2} — ABNORMAL LOW (ref >59)
GFR, EST AFRICAN AMERICAN: 43 mL/min/{1.73_m2} — AB (ref >59)
GLOBULIN, TOTAL: 2.1 g/dL (ref 1.5–4.5)
GLUCOSE: 106 mg/dL — AB (ref 65–99)
Potassium: 5.5 mmol/L — ABNORMAL HIGH (ref 3.5–5.2)
SODIUM: 142 mmol/L (ref 134–144)
Total Protein: 6.6 g/dL (ref 6.0–8.5)

## 2016-12-19 ENCOUNTER — Ambulatory Visit (INDEPENDENT_AMBULATORY_CARE_PROVIDER_SITE_OTHER): Payer: PPO | Admitting: *Deleted

## 2016-12-19 DIAGNOSIS — I639 Cerebral infarction, unspecified: Secondary | ICD-10-CM

## 2016-12-19 NOTE — Progress Notes (Signed)
Carelink Summary Report / Loop Recorder 

## 2016-12-21 DIAGNOSIS — F419 Anxiety disorder, unspecified: Secondary | ICD-10-CM | POA: Diagnosis not present

## 2016-12-21 DIAGNOSIS — I69314 Frontal lobe and executive function deficit following cerebral infarction: Secondary | ICD-10-CM | POA: Diagnosis not present

## 2016-12-21 DIAGNOSIS — I69320 Aphasia following cerebral infarction: Secondary | ICD-10-CM | POA: Diagnosis not present

## 2016-12-21 DIAGNOSIS — M1990 Unspecified osteoarthritis, unspecified site: Secondary | ICD-10-CM | POA: Diagnosis not present

## 2016-12-21 DIAGNOSIS — F329 Major depressive disorder, single episode, unspecified: Secondary | ICD-10-CM | POA: Diagnosis not present

## 2016-12-22 ENCOUNTER — Other Ambulatory Visit: Payer: Self-pay | Admitting: Family Medicine

## 2016-12-22 ENCOUNTER — Other Ambulatory Visit: Payer: Self-pay | Admitting: Physician Assistant

## 2016-12-22 DIAGNOSIS — R030 Elevated blood-pressure reading, without diagnosis of hypertension: Secondary | ICD-10-CM

## 2016-12-22 NOTE — Telephone Encounter (Signed)
LOV 11/08/2016.

## 2016-12-23 NOTE — Telephone Encounter (Signed)
Please call in clonazepam  

## 2016-12-23 NOTE — Telephone Encounter (Signed)
Rx called in to pharmacy. 

## 2016-12-26 ENCOUNTER — Telehealth: Payer: Self-pay | Admitting: Neurology

## 2016-12-26 NOTE — Telephone Encounter (Signed)
Pt daughter calling, she received word from the insurance company of the approval of the CT scan and the ultrasound that Dr Leonie Man wants to do but has not been contacted to schedule these two things, please call pt daughter

## 2016-12-27 NOTE — Telephone Encounter (Signed)
Authorization in place for Ultrasound [employer]. I will call Butch Penny Back at [facility] to Schedule.

## 2016-12-27 NOTE — Telephone Encounter (Signed)
Spoke with patient daughter on dpr. And informed here that it was approved and I sent it to GI... I told her we have been having glitch problems with GI but she can call at anytime at [phone] to schedule MRI.Marland Kitchen She was asking about the ultra sound I told her I would ask Hinton Dyer.

## 2016-12-27 NOTE — Telephone Encounter (Signed)
I have called and spoke to daughter daughter Beverlee Nims . Patient is scheduled at [facility] 12/29/2016 arrive at 12:45 for 1:00 apt.  For her Venous . Health [employer] advantage did approve. Thanks Hinton Dyer.

## 2016-12-27 NOTE — Telephone Encounter (Signed)
Message was sent to [facility] about Ct scan, and Hinton Dyer for ultrasound.

## 2016-12-28 ENCOUNTER — Ambulatory Visit
Admission: RE | Admit: 2016-12-28 | Discharge: 2016-12-28 | Disposition: A | Discharge status: Home / Self Care | Payer: PPO | Source: Ambulatory Visit | Attending: Neurology | Admitting: Neurology

## 2016-12-28 DIAGNOSIS — R410 Disorientation, unspecified: Secondary | ICD-10-CM | POA: Diagnosis not present

## 2016-12-29 ENCOUNTER — Ambulatory Visit (HOSPITAL_COMMUNITY)
Admission: RE | Admit: 2016-12-29 | Discharge: 2016-12-29 | Disposition: A | Discharge status: Home / Self Care | Payer: PPO | Source: Ambulatory Visit | Attending: Neurology | Admitting: Neurology

## 2016-12-29 DIAGNOSIS — Z86718 Personal history of other venous thrombosis and embolism: Secondary | ICD-10-CM | POA: Insufficient documentation

## 2016-12-29 DIAGNOSIS — Z8673 Personal history of transient ischemic attack (TIA), and cerebral infarction without residual deficits: Secondary | ICD-10-CM | POA: Diagnosis not present

## 2016-12-29 DIAGNOSIS — R410 Disorientation, unspecified: Secondary | ICD-10-CM

## 2016-12-29 DIAGNOSIS — I639 Cerebral infarction, unspecified: Secondary | ICD-10-CM

## 2016-12-29 DIAGNOSIS — R936 Abnormal findings on diagnostic imaging of limbs: Secondary | ICD-10-CM | POA: Diagnosis not present

## 2016-12-29 LAB — CUP PACEART REMOTE DEVICE CHECK
MDC IDC PG IMPLANT DT: 20180112
MDC IDC SESS DTM: 20180524091654

## 2016-12-29 NOTE — Progress Notes (Signed)
VASCULAR LAB PRELIMINARY  PRELIMINARY  PRELIMINARY  PRELIMINARY  Bilateral lower extremity venous duplex completed  Preliminary report:  Right:  No evidence of DVT, superficial thrombosis, or Baker's cyst. Left - positive for a chronic DVT noted in the oproximal popliteal. There is also thickness of the walls of the greater saphenous vein. There is no evience of a Baker's cyst.  Vere Diantonio, RVS 12/29/2016, 1:57 PM

## 2017-01-04 ENCOUNTER — Telehealth: Payer: Self-pay | Admitting: Neurology

## 2017-01-04 NOTE — Telephone Encounter (Signed)
Pts daughter would like Ct scan, and ultrasound results. Also the daughter would like to know if there would be any change with the eliquis medication.

## 2017-01-04 NOTE — Telephone Encounter (Signed)
Pt's daughter request CT and ultrasound results. Also will there be any change with eliquis. Please call

## 2017-01-09 ENCOUNTER — Ambulatory Visit (INDEPENDENT_AMBULATORY_CARE_PROVIDER_SITE_OTHER): Payer: PPO | Admitting: Family Medicine

## 2017-01-09 ENCOUNTER — Encounter: Payer: Self-pay | Admitting: Family Medicine

## 2017-01-09 VITALS — BP 120/64 | HR 73 | Temp 97.7°F | Resp 16 | Wt 119.0 lb

## 2017-01-09 DIAGNOSIS — R4701 Aphasia: Secondary | ICD-10-CM | POA: Diagnosis not present

## 2017-01-09 DIAGNOSIS — I63412 Cerebral infarction due to embolism of left middle cerebral artery: Secondary | ICD-10-CM | POA: Diagnosis not present

## 2017-01-09 DIAGNOSIS — I10 Essential (primary) hypertension: Secondary | ICD-10-CM

## 2017-01-09 DIAGNOSIS — D489 Neoplasm of uncertain behavior, unspecified: Secondary | ICD-10-CM | POA: Diagnosis not present

## 2017-01-09 NOTE — Progress Notes (Signed)
Patient: Stephanie [name] Female    DOB: 02/06/1936   80 y.o.   MRN: [id number] Visit Date: 01/09/2017  Today's Provider: Vernie Murders, PA   Chief Complaint  Patient presents with  . Hypertension  . Cerebrovascular Accident   Subjective:    HPI   CVA Stephanie [name] had a CVA in January of 2018. She is taking Eliquis for this problem. This is being F/B by neurology. A head CT Was performed on 12/28/2016. They are waiting to hear the results of this, as well as [place] results of LLE before neuro Lucianne Lei make a decision about D/Cing the Eliquis.      Hypertension, follow-up:  BP Readings from Last 3 Encounters:  01/09/17 120/64  12/15/16 (!) 160/72  11/08/16 116/72    She was last seen for hypertension 3 months ago.  BP at that visit was 170/82. Management since that visit includes continue Amlodipine 5 mg that was started the previous day. She reports good compliance with treatment. She is not having side effects.  She is exercising. Walks. She is not adherent to low salt diet.   Outside blood pressures are being checked regularly be speech therapist. She is experiencing lower extremity edema.  Patient denies chest pain, chest pressure/discomfort, claudication, dyspnea, exertional chest pressure/discomfort, fatigue, irregular heart beat, near-syncope, orthopnea, palpitations and syncope.   Cardiovascular risk factors include advanced age (older than 53 for men, 60 for women), dyslipidemia and hypertension.    Weight trend: fluctuating a bit Wt Readings from Last 3 Encounters:  01/09/17 119 lb (54 kg)  12/15/16 114 lb (51.7 kg)  11/08/16 120 lb 3.2 oz (54.5 kg)    Current diet: in general, a "healthy" diet    ------------------------------------------------------------------------ Patient Active Problem List   Diagnosis Date Noted  . PFO (patent foramen ovale)   . DVT (deep venous thrombosis) ([place])   . Cerebrovascular accident (CVA) due to embolism of left  middle cerebral artery ([place])   . Expressive aphasia   . Elevated troponin 08/18/2016  . Mild malnutrition ([place]) 08/18/2016  . Acute CVA (cerebrovascular accident) ([place]) 08/17/2016  . Acute exacerbation of chronic obstructive pulmonary disease (COPD) ([place]) 07/19/2016  . Depression 05/03/2016  . Mild dementia 04/19/2016  . COPD, moderate ([place]) 03/05/2015  . Anxiety and depression 03/04/2015  . Arteriosclerosis of coronary artery 03/04/2015  . CAFL (chronic airflow limitation) ([place]) 03/04/2015  . Breathlessness on exertion 03/04/2015  . HLD (hyperlipidemia) 03/04/2015  . Osteopenia 03/04/2015  . Peptic ulcer 03/04/2015  . Amnesia 03/20/2014  . Disordered sleep 03/20/2014  . Allergy to environmental factors 11/14/2013  . Gastroduodenal ulcer 11/14/2013  . GI bleed 11/14/2013   Past Surgical History:  Procedure Laterality Date  . BALLOON ANGIOPLASTY, ARTERY  05/17/2009  . CATARACT EXTRACTION W/PHACO Left 05/24/2016   Procedure: CATARACT EXTRACTION PHACO AND INTRAOCULAR LENS PLACEMENT (IOC);  Surgeon: Birder Robson, MD;  Location: [facility] ORS;  Service: Ophthalmology;  Laterality: Left;  Lot# [id number] H [place]:   00:52.3 AP%:   27.3 CDE:  14.26   . CATARACT EXTRACTION W/PHACO Right 06/14/2016   Procedure: CATARACT EXTRACTION PHACO AND INTRAOCULAR LENS PLACEMENT (IOC);  Surgeon: Birder Robson, MD;  Location: [facility] ORS;  Service: Ophthalmology;  Laterality: Right;  Lot# [id number] H [place]: 01:13.4 AP%: 24.4 CDE: 17.85  . CORONARY ANGIOPLASTY     STENT  . EP IMPLANTABLE DEVICE N/A 08/19/2016   Procedure: Loop Recorder Insertion;  Surgeon: Will Meredith Leeds, MD;  Location: [facility];  Service: Cardiovascular;  Laterality: N/A;   Family History  Problem Relation Age of Onset  . Hypertension Mother   . Diabetes Mother   . Pancreatic cancer Mother 45  . Cancer Mother   . Suicidality Father 2  . Hypertension Father   . Alcohol abuse Father   . Hypertension Sister   . Heart disease  Sister   . Kidney disease Sister   . Varicose Veins Sister    Allergies  Allergen Reactions  . Duloxetine Hcl     unknown  . Influenza Vaccines     unknown  . Venlafaxine     GI distress    Current Outpatient Prescriptions:  .  acetaminophen (TYLENOL) 500 MG tablet, Take 1 tablet by mouth 2 (two) times daily as needed. , Disp: , Rfl:  .  albuterol (VENTOLIN HFA) 108 (90 Base) MCG/ACT inhaler, Inhale 2 puffs into the lungs every 6 (six) hours as needed., Disp: 3 Inhaler, Rfl: 3 .  amLODipine (NORVASC) 5 MG tablet, TAKE ONE (1) TABLET BY MOUTH EVERY DAY, Disp: 90 tablet, Rfl: 3 .  apixaban (ELIQUIS) 5 MG TABS tablet, Take 1 tablet (5 mg total) by mouth 2 (two) times daily., Disp: 60 tablet, Rfl: 3 .  budesonide-formoterol (SYMBICORT) 160-4.5 MCG/ACT inhaler, Inhale 2 puffs into the lungs 2 (two) times daily., Disp: 3 Inhaler, Rfl: 3 .  Calcium Carb-Cholecalciferol (CALCIUM 600+D) 600-800 MG-UNIT TABS, Take 2 tablets by mouth daily., Disp: , Rfl:  .  clonazePAM (KLONOPIN) 0.5 MG tablet, TAKE 1 TABLET BY MOUTH TWICE DAILY AS NEEDED FOR ANXIETY, Disp: 180 tablet, Rfl: 5 .  docusate sodium (COLACE) 100 MG capsule, Take 200 mg by mouth at bedtime., Disp: , Rfl:  .  gabapentin (NEURONTIN) 100 MG capsule, Take 1 capsule (100 mg total) by mouth 3 (three) times daily. (Patient taking differently: Take 100 mg by mouth daily. ), Disp: 90 capsule, Rfl: 3 .  PARoxetine (PAXIL) 10 MG tablet, TAKE TWO TABLETS BY MOUTH AT BEDTIME DAILY, Disp: 60 tablet, Rfl: 3 .  Spacer/Aero Chamber Mouthpiece MISC, Use spacer on inhalers for better administration., Disp: 1 each, Rfl: 1 .  tiotropium (SPIRIVA HANDIHALER) 18 MCG inhalation capsule, Place 1 capsule (18 mcg total) into inhaler and inhale daily., Disp: 90 capsule, Rfl: 3  Current Facility-Administered Medications:  .  ipratropium-albuterol (DUONEB) 0.5-2.5 (3) MG/3ML nebulizer solution 3 mL, 3 mL, Nebulization, Q4H PRN, Hower, Aaron Mose, MD  Review of Systems   Constitutional: Negative for activity change, appetite change, chills, diaphoresis, fatigue, fever and unexpected weight change.  Respiratory: Negative for shortness of breath.   Cardiovascular: Positive for leg swelling. Negative for chest pain and palpitations.  Neurological: Positive for dizziness (positional).    Social History  Substance Use Topics  . Smoking status: Former Smoker    Quit date: 08/07/2001  . Smokeless tobacco: Never Used     Comment: QUIT IN 2002  . Alcohol use No   Objective:   BP 120/64 (BP Location: Right Arm, Patient Position: Sitting, Cuff Size: Normal)   Pulse 73   Temp 97.7 F (36.5 C) (Oral)   Resp 16   Wt 119 lb (54 kg)   SpO2 96%   BMI 21.77 kg/m  Vitals:   01/09/17 1332  BP: 120/64  Pulse: 73  Resp: 16  Temp: 97.7 F (36.5 C)  TempSrc: Oral  SpO2: 96%  Weight: 119 lb (54 kg)     Physical Exam  Constitutional: She appears well-developed and well-nourished. No distress.  HENT:  Head: Normocephalic and atraumatic.  Right Ear: Hearing normal.  Left Ear: Hearing normal.  Nose: Nose normal.  Eyes: Conjunctivae and lids are normal. Right eye exhibits no discharge. Left eye exhibits no discharge. No scleral icterus.  Neck: Neck supple.  Cardiovascular: Normal rate and regular rhythm.   Pulmonary/Chest: Effort normal and breath sounds normal. No respiratory distress.  Abdominal: Soft. Bowel sounds are normal.  Musculoskeletal: Normal range of motion.  Many varicose veins in the left lower extremity. Negative Homan's sign. Symmetric pulses.  Skin: Skin is intact. No lesion and no rash noted.  Psychiatric: She has a normal mood and affect. Her speech is normal and behavior is normal. Thought content normal.      Assessment & Plan:     1. Expressive aphasia Very slow improvement. Patient gets frustrated at times but can write occasionally to convey messages. Continue to have scrambled sentences occasionally. Family feels speech therapy  is helping slowly. Proceed with present therapy and follow up with neurologist  (Dr. Leonie Man).  2. Cerebrovascular accident (CVA) due to embolism of left middle cerebral artery ([place]) Followed by Dr. Leonie Man (neurologist) and awaiting follow up CT scan report.  3. Essential hypertension Well controlled on the Amlodipine 5 mg qd without side effects. No peripheral edema noted.  Recent Results (from the past 2160 hour(s))  CUP PACEART REMOTE DEVICE CHECK     Status: None   Collection Time: 10/18/16 10:09 PM  Result Value Ref Range   Pulse Generator Manufacturer MERM    Date Time Interrogation Session [id number]    Pulse Gen Model JHE17 Reveal LINQ    Pulse Gen Serial Number [id number]    Clinic Name [facility]    Implantable Pulse Generator Type ICM/ILR    Implantable Pulse Generator Implant Date [id number]    Eval Rhythm SR   CUP PACEART REMOTE DEVICE CHECK     Status: None   Collection Time: 11/17/16  3:26 PM  Result Value Ref Range   Pulse Generator Manufacturer MERM    Date Time Interrogation Session [phone]    Pulse Gen Model [id number] Reveal LINQ    Pulse Gen Serial Number [id number]    Clinic Name [facility]    Implantable Pulse Generator Type ICM/ILR    Implantable Pulse Generator Implant Date [id number]    Eval Rhythm SR w/PVCs   CBC with Differential/Platelets     Status: None   Collection Time: 12/15/16  4:51 PM  Result Value Ref Range   WBC 6.7 3.4 - 10.8 x10E3/uL   RBC 3.96 3.77 - 5.28 x10E6/uL   Hemoglobin 12.0 11.1 - 15.9 g/dL   Hematocrit 36.8 34.0 - 46.6 %   MCV 93 79 - 97 fL   MCH 30.3 26.6 - 33.0 pg   MCHC 32.6 31.5 - 35.7 g/dL   RDW 14.8 12.3 - 15.4 %   Platelets 288 150 - 379 x10E3/uL   Neutrophils 60 Not Estab. %   Lymphs 28 Not Estab. %   Monocytes 9 Not Estab. %   Eos 2 Not Estab. %   Basos 1 Not Estab. %   Neutrophils Absolute 4.1 1.4 - 7.0 x10E3/uL   Lymphocytes Absolute 1.9 0.7 - 3.1 x10E3/uL   Monocytes Absolute 0.6 0.1 - 0.9  x10E3/uL   EOS (ABSOLUTE) 0.1 0.0 - 0.4 x10E3/uL   Basophils Absolute 0.0 0.0 - 0.2 x10E3/uL   Immature Granulocytes 0 Not Estab. %   Immature Grans (Abs) 0.0 0.0 - 0.1 x10E3/uL  CMP  Status: Abnormal   Collection Time: 12/15/16  4:51 PM  Result Value Ref Range   Glucose 106 (H) 65 - 99 mg/dL   BUN 20 8 - 27 mg/dL   Creatinine, Ser 1.35 (H) 0.57 - 1.00 mg/dL   GFR calc non Af Amer 38 (L) >59 mL/min/1.73   GFR calc Af Amer 43 (L) >59 mL/min/1.73   BUN/Creatinine Ratio 15 12 - 28   Sodium 142 134 - 144 mmol/L   Potassium 5.5 (H) 3.5 - 5.2 mmol/L   Chloride 101 96 - 106 mmol/L   CO2 26 18 - 29 mmol/L   Calcium 10.3 8.7 - 10.3 mg/dL   Total Protein 6.6 6.0 - 8.5 g/dL   Albumin 4.5 3.5 - 4.8 g/dL   Globulin, Total 2.1 1.5 - 4.5 g/dL   Albumin/Globulin Ratio 2.1 1.2 - 2.2   Bilirubin Total <0.2 0.0 - 1.2 mg/dL   Alkaline Phosphatase 82 39 - 117 IU/L   AST 20 0 - 40 IU/L   ALT 14 0 - 32 IU/L  Urinalysis with Reflex Microscopic     Status: None   Collection Time: 12/15/16  4:54 PM  Result Value Ref Range   Specific Gravity, UA 1.006 1.005 - 1.030   pH, UA 6.5 5.0 - 7.5   Color, UA Yellow Yellow   Appearance Ur Clear Clear   Leukocytes, UA Negative Negative   Protein, UA Negative Negative/Trace   Glucose, UA Negative Negative   Ketones, UA Negative Negative   RBC, UA Negative Negative   Bilirubin, UA Negative Negative   Urobilinogen, Ur 0.2 0.2 - 1.0 mg/dL   Nitrite, UA Negative Negative   Microscopic Examination Comment     Comment: Microscopic not indicated and not performed.  CUP PACEART REMOTE DEVICE CHECK     Status: None   Collection Time: 12/19/16  1:15 PM  Result Value Ref Range   Pulse Generator Manufacturer MERM    Date Time Interrogation Session [id number]    Pulse Gen Model [id number] Reveal LINQ    Pulse Gen Serial Number [id number]    Clinic Name [facility]    Implantable Pulse Generator Type ICM/ILR    Implantable Pulse Generator Implant Date  [id number]    Eval Rhythm SR     4. Neoplasm of uncertain behavior A 2-3 mm brown lesion with rolled edge (under magnification) on the right side of the nose. Family has noticed she will "pick" at it at times. Concerned it may be a basal cell carcinoma. Schedule dermatology referral. - Ambulatory referral to Dermatology       Vernie Murders, [facility]

## 2017-01-10 ENCOUNTER — Ambulatory Visit: Payer: PPO | Admitting: Nurse Practitioner

## 2017-01-11 ENCOUNTER — Telehealth: Payer: Self-pay | Admitting: Family Medicine

## 2017-01-11 NOTE — Telephone Encounter (Signed)
Sandy with [facility] is requesting verbal order for 2 additional visits.  CB#[phone]/MW

## 2017-01-11 NOTE — Telephone Encounter (Signed)
I spoke to the patient's daughter and communicated results of recent lab work and UA been normal and CT scan of the head showing no new or worrisome findings and expected evolutionary changes. Lower extremity venous Dopplers shows persistent chronic clot in the left popliteal vein hence I recommend continuing eliquis and repeat lower extremity venous Doppler at next follow-up visit IN 3-6 MONTHS. She voiced understanding

## 2017-01-12 DIAGNOSIS — F419 Anxiety disorder, unspecified: Secondary | ICD-10-CM | POA: Diagnosis not present

## 2017-01-12 DIAGNOSIS — M1990 Unspecified osteoarthritis, unspecified site: Secondary | ICD-10-CM | POA: Diagnosis not present

## 2017-01-12 DIAGNOSIS — I69314 Frontal lobe and executive function deficit following cerebral infarction: Secondary | ICD-10-CM | POA: Diagnosis not present

## 2017-01-12 DIAGNOSIS — I69320 Aphasia following cerebral infarction: Secondary | ICD-10-CM | POA: Diagnosis not present

## 2017-01-12 DIAGNOSIS — F329 Major depressive disorder, single episode, unspecified: Secondary | ICD-10-CM | POA: Diagnosis not present

## 2017-01-12 NOTE — Telephone Encounter (Signed)
Proceed with 2 additional visits by speech therapist for post CVA expressive aphasia.

## 2017-01-12 NOTE — Telephone Encounter (Signed)
Verbal order given to [facility] with [facility].

## 2017-01-16 ENCOUNTER — Ambulatory Visit (INDEPENDENT_AMBULATORY_CARE_PROVIDER_SITE_OTHER): Payer: PPO | Admitting: *Deleted

## 2017-01-16 DIAGNOSIS — I639 Cerebral infarction, unspecified: Secondary | ICD-10-CM | POA: Diagnosis not present

## 2017-01-17 DIAGNOSIS — C44311 Basal cell carcinoma of skin of nose: Secondary | ICD-10-CM | POA: Diagnosis not present

## 2017-01-17 DIAGNOSIS — D485 Neoplasm of uncertain behavior of skin: Secondary | ICD-10-CM | POA: Diagnosis not present

## 2017-01-17 DIAGNOSIS — L821 Other seborrheic keratosis: Secondary | ICD-10-CM | POA: Diagnosis not present

## 2017-01-17 NOTE — Progress Notes (Signed)
Carelink Summary Report / Loop Recorder 

## 2017-01-19 DIAGNOSIS — F419 Anxiety disorder, unspecified: Secondary | ICD-10-CM | POA: Diagnosis not present

## 2017-01-19 DIAGNOSIS — I69320 Aphasia following cerebral infarction: Secondary | ICD-10-CM | POA: Diagnosis not present

## 2017-01-19 DIAGNOSIS — I69314 Frontal lobe and executive function deficit following cerebral infarction: Secondary | ICD-10-CM | POA: Diagnosis not present

## 2017-01-19 DIAGNOSIS — F329 Major depressive disorder, single episode, unspecified: Secondary | ICD-10-CM | POA: Diagnosis not present

## 2017-01-19 DIAGNOSIS — M1990 Unspecified osteoarthritis, unspecified site: Secondary | ICD-10-CM | POA: Diagnosis not present

## 2017-01-24 LAB — CUP PACEART REMOTE DEVICE CHECK
Date Time Interrogation Session: 20180611223552
MDC IDC PG IMPLANT DT: 20180112

## 2017-01-26 ENCOUNTER — Telehealth: Payer: Self-pay

## 2017-01-26 NOTE — Telephone Encounter (Signed)
Stephanie [name] would like a verbal okay for speech therapy.  One day a week for six weeks.   Thanks,   -Mickel Baas

## 2017-01-26 NOTE — Telephone Encounter (Signed)
Proceed with speech therapy once a week for 6 weeks for expressive aphasia secondary to stroke. Please send report of progress.

## 2017-01-31 DIAGNOSIS — C44311 Basal cell carcinoma of skin of nose: Secondary | ICD-10-CM | POA: Diagnosis not present

## 2017-02-02 DIAGNOSIS — I69320 Aphasia following cerebral infarction: Secondary | ICD-10-CM | POA: Diagnosis not present

## 2017-02-02 DIAGNOSIS — I69314 Frontal lobe and executive function deficit following cerebral infarction: Secondary | ICD-10-CM | POA: Diagnosis not present

## 2017-02-02 DIAGNOSIS — F329 Major depressive disorder, single episode, unspecified: Secondary | ICD-10-CM | POA: Diagnosis not present

## 2017-02-02 DIAGNOSIS — F419 Anxiety disorder, unspecified: Secondary | ICD-10-CM | POA: Diagnosis not present

## 2017-02-02 DIAGNOSIS — M1990 Unspecified osteoarthritis, unspecified site: Secondary | ICD-10-CM | POA: Diagnosis not present

## 2017-02-11 ENCOUNTER — Other Ambulatory Visit: Payer: Self-pay | Admitting: Family Medicine

## 2017-02-15 ENCOUNTER — Ambulatory Visit (INDEPENDENT_AMBULATORY_CARE_PROVIDER_SITE_OTHER): Payer: PPO | Admitting: *Deleted

## 2017-02-15 DIAGNOSIS — I639 Cerebral infarction, unspecified: Secondary | ICD-10-CM | POA: Diagnosis not present

## 2017-02-16 DIAGNOSIS — F419 Anxiety disorder, unspecified: Secondary | ICD-10-CM | POA: Diagnosis not present

## 2017-02-16 DIAGNOSIS — F329 Major depressive disorder, single episode, unspecified: Secondary | ICD-10-CM | POA: Diagnosis not present

## 2017-02-16 DIAGNOSIS — I69314 Frontal lobe and executive function deficit following cerebral infarction: Secondary | ICD-10-CM | POA: Diagnosis not present

## 2017-02-16 DIAGNOSIS — I69320 Aphasia following cerebral infarction: Secondary | ICD-10-CM | POA: Diagnosis not present

## 2017-02-16 NOTE — Progress Notes (Signed)
Carelink Summary Report / Loop Recorder 

## 2017-02-18 ENCOUNTER — Other Ambulatory Visit: Payer: Self-pay | Admitting: Family Medicine

## 2017-02-24 LAB — CUP PACEART REMOTE DEVICE CHECK
Implantable Pulse Generator Implant Date: 20180112
MDC IDC SESS DTM: 20180712001028

## 2017-03-06 ENCOUNTER — Ambulatory Visit (INDEPENDENT_AMBULATORY_CARE_PROVIDER_SITE_OTHER): Payer: PPO | Admitting: Nurse Practitioner

## 2017-03-06 ENCOUNTER — Encounter: Payer: Self-pay | Admitting: Nurse Practitioner

## 2017-03-06 VITALS — BP 134/69 | HR 77

## 2017-03-06 DIAGNOSIS — I82432 Acute embolism and thrombosis of left popliteal vein: Secondary | ICD-10-CM | POA: Diagnosis not present

## 2017-03-06 DIAGNOSIS — I639 Cerebral infarction, unspecified: Secondary | ICD-10-CM

## 2017-03-06 DIAGNOSIS — E782 Mixed hyperlipidemia: Secondary | ICD-10-CM

## 2017-03-06 DIAGNOSIS — R4701 Aphasia: Secondary | ICD-10-CM | POA: Diagnosis not present

## 2017-03-06 NOTE — Patient Instructions (Signed)
Stressed the importance of management of risk factors to prevent further stroke Continue eliquis for secondary stroke prevention Maintain strict control of hypertension with blood pressure goal below 130/90, today's reading 134/69 continue antihypertensive medications Control of diabetes with hemoglobin A1c below 6.5 followed by primary care  Cholesterol with LDL cholesterol less than 70, followed by primary care,   Exercise by walking, slowly increase , eat healthy diet with whole grains,  fresh fruits and vegetables Continue speech therapy Will  order lower extremity venous  ultrasound for DVT around September 1 Follow-up in 6 months

## 2017-03-06 NOTE — Progress Notes (Signed)
[facility]  PATIENT: Stephanie [name] DOB: 24-Jan-1936   REASON FOR VISIT: Follow-up for cryptogenic stroke HISTORY FROM:daughter Stephanie [name] and patient    HISTORY OF PRESENT ILLNESS:UPDATE 07/30/2018CM StephanieEverett, 81 year old female returns for follow-up with history of hospital admission for stroke in January 2018. She continues to have some expressive aphasia. She continues to get speech therapy 1 time a week. She is currently on eliquis for secondary stroke prevention and deep vein thrombosis. She has not had further stroke or TIA symptoms. She has minimal bruising and no bleeding. Blood pressure in the office today 134/69. Lipids are followed by primary care. When last seen she was having some transient confusion of unclear etiology .  UA  normal and CT scan of the head showing no new or worrisome findings and expected evolutionary changes. CBC and CMP normal Lower extremity venous Dopplers shows persistent chronic clot in the left popliteal vein hence continuing eliquis and repeat lower extremity venous Doppler in September.She returns for reevaluation   HISTORY 12/15/16 CMMs Stephanie [name] is a 86 year old pleasant Caucasian lady who is seen today for first office follow-up visit following hospital admission for stroke in January 2018. She is accompanied by 2 of her daughters provide most of the history which is also obtained from [place] records and have personally reviewed imaging films.Stephanie [name] an 18 y.o.femalewith hx as below was last seen normal 08/16/16 night and presented when daughter found her with expressive aphasia and L sided weakness. CTH showed acute infract in the L temp and parietal cortex region. Patient was not administered IV TPA secondary to delay in arrival. Patient was found to have right-sided visual field cut and expressive aphasia. MRA of the brain showed multifocal moderate stenosis and bilateral middle cerebral artery and  M2 segments. Carotid ultrasound showed no significant extracranial stenosis. Transthoracic echo showed normal ejection fraction. Transesophageal echocardiogram was attempted but could not be performed due to difficulty in passing the probe. Transcanal Doppler bubble study showed high intensity transient signals suggestive of large patent foramen ovale. Lower extremity venous Dopplers showed subacute deep vein thrombosis involving the proximal popliteal vein in the left leg as well as superficial thrombosis in the left greater than right saphenous vein from the mid to proximal calf.h. Patient was started on eliquis. Hemoglobin A1c was 5.7 and LDL cholesterol was 77 mg percent. She was started on Lipitor 40 mg. She was transferred for rehabilitation. She is currently at home and getting speech therapy. Speech is improving but she is still struggling nonfluent speech. She is living by herself the daughters check on her frequently and she spends the night alone. She was doing better until a week ago when the daughters were concerned that she was found to be more confused as well as agitated. They called for an urgent appointment to see me but feels it may have settled down in the last day or 2 she is doing better. She has not had any testing or evaluation done for to evaluate this episode of confusion. The patient has a loop recorder implanted and so for paroxysmal atrial fibrillation has not yet been detected   REVIEW OF SYSTEMS: Full 14 system review of systems performed and notable only for those listed, all others are neg:  Constitutional: neg  Cardiovascular: neg Ear/Nose/Throat: neg  Skin: neg Eyes: neg Respiratory: neg Gastroitestinal: neg  Hematology/Lymphatic: neg  Endocrine: neg Musculoskeletal:neg Allergy/Immunology: neg Neurological: Speech difficulty Psychiatric: neg Sleep : neg   ALLERGIES: Allergies  Allergen Reactions  .  Duloxetine Hcl     unknown  . Influenza Vaccines      unknown  . Venlafaxine     GI distress    HOME MEDICATIONS: Outpatient Medications Prior to Visit  Medication Sig Dispense Refill  . acetaminophen (TYLENOL) 500 MG tablet Take 1 tablet by mouth 2 (two) times daily as needed.     Marland Kitchen albuterol (VENTOLIN HFA) 108 (90 Base) MCG/ACT inhaler Inhale 2 puffs into the lungs every 6 (six) hours as needed. 3 Inhaler 3  . amLODipine (NORVASC) 5 MG tablet TAKE ONE (1) TABLET BY MOUTH EVERY DAY 90 tablet 3  . apixaban (ELIQUIS) 5 MG TABS tablet Take 1 tablet (5 mg total) by mouth 2 (two) times daily. 60 tablet 3  . Calcium Carb-Cholecalciferol (CALCIUM 600+D) 600-800 MG-UNIT TABS Take 2 tablets by mouth daily.    . clonazePAM (KLONOPIN) 0.5 MG tablet TAKE 1 TABLET BY MOUTH TWICE DAILY AS NEEDED FOR ANXIETY 180 tablet 5  . docusate sodium (COLACE) 100 MG capsule Take 200 mg by mouth at bedtime.    . gabapentin (NEURONTIN) 100 MG capsule Take 1 capsule (100 mg total) by mouth 3 (three) times daily. (Patient taking differently: Take 100 mg by mouth daily. ) 90 capsule 3  . PARoxetine (PAXIL) 10 MG tablet TAKE TWO TABLETS BY MOUTH AT BEDTIME 60 tablet 6  . Spacer/Aero Chamber Mouthpiece MISC Use spacer on inhalers for better administration. 1 each 1  . SPIRIVA HANDIHALER 18 MCG inhalation capsule INHALE CONTENTS OF ONE CAPSULE ONCE A DAY AS DIRECTED 90 capsule 4  . SYMBICORT 160-4.5 MCG/ACT inhaler INHALE TWO PUFFS INTO THE LUNGS TWICE A DAY 30.6 g 3   Facility-Administered Medications Prior to Visit  Medication Dose Route Frequency Provider Last Rate Last Dose  . ipratropium-albuterol (DUONEB) 0.5-2.5 (3) MG/3ML nebulizer solution 3 mL  3 mL Nebulization Q4H PRN Hower, Aaron Mose, MD        PAST MEDICAL HISTORY: Past Medical History:  Diagnosis Date  . Arthritis   . Complication of anesthesia   . COPD (chronic obstructive pulmonary disease) ([place])   . Coronary artery disease 2013   stent  . Depression   . Edema   . Pneumonia    IN PAST  . PONV  (postoperative nausea and vomiting)   . Stroke [facility])     PAST SURGICAL HISTORY: Past Surgical History:  Procedure Laterality Date  . BALLOON ANGIOPLASTY, ARTERY  05/17/2009  . CATARACT EXTRACTION W/PHACO Left 05/24/2016   Procedure: CATARACT EXTRACTION PHACO AND INTRAOCULAR LENS PLACEMENT (IOC);  Surgeon: Birder Robson, MD;  Location: [facility] ORS;  Service: Ophthalmology;  Laterality: Left;  Lot# [id number] H [place]:   00:52.3 AP%:   27.3 CDE:  14.26   . CATARACT EXTRACTION W/PHACO Right 06/14/2016   Procedure: CATARACT EXTRACTION PHACO AND INTRAOCULAR LENS PLACEMENT (IOC);  Surgeon: Birder Robson, MD;  Location: [facility] ORS;  Service: Ophthalmology;  Laterality: Right;  Lot# [id number] H [place]: 01:13.4 AP%: 24.4 CDE: 17.85  . CORONARY ANGIOPLASTY     STENT  . EP IMPLANTABLE DEVICE N/A 08/19/2016   Procedure: Loop Recorder Insertion;  Surgeon: Will Meredith Leeds, MD;  Location: [place] [facility];  Service: Cardiovascular;  Laterality: N/A;    FAMILY HISTORY: Family History  Problem Relation Age of Onset  . Hypertension Mother   . Diabetes Mother   . Pancreatic cancer Mother 25  . Cancer Mother   . Suicidality Father 29  . Hypertension Father   . Alcohol abuse Father   .  Hypertension Sister   . Heart disease Sister   . Kidney disease Sister   . Varicose Veins Sister   . Parkinson's disease Sister     SOCIAL HISTORY: Social History   Social History  . Marital status: Widowed    Spouse name: N/A  . Number of children: 3  . Years of education: N/A   Occupational History  . retired    Social History Main Topics  . Smoking status: Former Smoker    Quit date: 08/07/2001  . Smokeless tobacco: Never Used     Comment: QUIT IN 2002  . Alcohol use No  . Drug use: No  . Sexual activity: Not on file   Other Topics Concern  . Not on file   Social History Narrative   Lives alone     PHYSICAL EXAM  Vitals:   03/06/17 1529  BP: 134/69  Pulse: 77   There is no height or  weight on file to calculate BMI.  Generalized: Well developed, in no acute distress  Head: normocephalic and atraumatic,. Oropharynx benign  Neck: Supple, no carotid bruits  Cardiac: Regular rate rhythm, no murmur  Musculoskeletal: No deformity   Neurological examination   Mentation: Alert oriented to time, place, , Moderate expressive and mild receptive aphasia.  Follows simple one step commands.    Cranial nerve II-XII: Fundoscopic exam not done.Pupils were equal round reactive to light extraocular movements were full, visual field were full on confrontational test. Facial sensation and strength were normal. hearing was intact to finger rubbing bilaterally. Uvula tongue midline. head turning and shoulder shrug were normal and symmetric.Tongue protrusion into cheek strength was normal. Motor: normal bulk and tone, full strength in the BUE, BLE, fine finger movements normal, no pronator drift. No focal weakness Sensory: normal and symmetric to light touch, pinprick, and  Vibration, in the upper and lower extremities Coordination: finger-nose-finger, heel-to-shin bilaterally, no dysmetria Reflexes: 1+ upper lower and symmetric., plantar responses were flexor bilaterally. Gait and Station: Rising up from seated position without assistance, normal stance,  moderate stride, good arm swing, smooth turning, able to perform tiptoe, and heel walking without difficulty. Tandem gait is steady  DIAGNOSTIC DATA (LABS, IMAGING, TESTING) - I reviewed patient records, labs, notes, testing and imaging myself where available.  Lab Results  Component Value Date   WBC 6.7 12/15/2016   HGB 12.0 12/15/2016   HCT 36.8 12/15/2016   MCV 93 12/15/2016   PLT 288 12/15/2016      Component Value Date/Time   NA 142 12/15/2016 1651   K 5.5 (H) 12/15/2016 1651   CL 101 12/15/2016 1651   CO2 26 12/15/2016 1651   GLUCOSE 106 (H) 12/15/2016 1651   GLUCOSE 103 (H) 08/19/2016 0339   BUN 20 12/15/2016 1651    CREATININE 1.35 (H) 12/15/2016 1651   CALCIUM 10.3 12/15/2016 1651   PROT 6.6 12/15/2016 1651   ALBUMIN 4.5 12/15/2016 1651   AST 20 12/15/2016 1651   ALT 14 12/15/2016 1651   ALKPHOS 82 12/15/2016 1651   BILITOT <0.2 12/15/2016 1651   GFRNONAA 38 (L) 12/15/2016 1651   GFRAA 43 (L) 12/15/2016 1651   Lab Results  Component Value Date   CHOL 222 (H) 08/18/2016   HDL 103 08/18/2016   LDLCALC 99 08/18/2016   TRIG 100 08/18/2016   CHOLHDL 2.2 08/18/2016   Lab Results  Component Value Date   HGBA1C 5.7 (H) 08/21/2016   No results found for: [id number] Lab Results  Component Value  Date   TSH 1.619 08/18/2016      ASSESSMENT AND PLAN 35 year Caucasian lady with left middle cerebral artery branch infarct in January 6629 of embolic etiology likely from paradoxical embolism from patent foramen ovale in the presence of acute deep vein thrombosis in the leg. Vascular risk factors of hypertension and hyperlipidemia and patent foramen ovale she has significant residual aphasia. Recent episode of transient confusion of unclear etiology. UA CBC CMP within normal limits CT of the head with nothing acute.  Stressed the importance of management of risk factors to prevent further stroke Continue eliquis for secondary stroke prevention Maintain strict control of hypertension with blood pressure goal below 130/90, today's reading 134/69 continue antihypertensive medications Control of diabetes with hemoglobin A1c below 6.5 followed by primary care  Cholesterol with LDL cholesterol less than 70, followed by primary care,   Exercise by walking, slowly increase , eat healthy diet with whole grains,  fresh fruits and vegetables Continue speech therapy Will  order lower extremity venous  ultrasound for DVT around September 1 Follow-up in 6 months I spent 25 minutes in total face to face time with the patient more than 50% of which was spent counseling and coordination of care, reviewing test results  reviewing medications and discussing and reviewing the diagnosis of stroke and management of risk factors Dennie Bible, [facility], [facility], APRN  [facility] [address], [place], [place] [address] [phone]

## 2017-03-07 NOTE — Progress Notes (Signed)
I agree with the above plan 

## 2017-03-10 DIAGNOSIS — I69314 Frontal lobe and executive function deficit following cerebral infarction: Secondary | ICD-10-CM | POA: Diagnosis not present

## 2017-03-10 DIAGNOSIS — F419 Anxiety disorder, unspecified: Secondary | ICD-10-CM | POA: Diagnosis not present

## 2017-03-10 DIAGNOSIS — I69320 Aphasia following cerebral infarction: Secondary | ICD-10-CM | POA: Diagnosis not present

## 2017-03-10 DIAGNOSIS — F329 Major depressive disorder, single episode, unspecified: Secondary | ICD-10-CM | POA: Diagnosis not present

## 2017-03-16 ENCOUNTER — Telehealth: Payer: Self-pay

## 2017-03-16 NOTE — Telephone Encounter (Signed)
Send verbal order for 3 more sessions by 5-36-64 and re-certify for another 6 weeks for speech therapy for expressive aphasia secondary to CVA.

## 2017-03-16 NOTE — Telephone Encounter (Signed)
Sandy advised as below-aa

## 2017-03-16 NOTE — Telephone Encounter (Signed)
Sandy with [facility] called and needs a verbal order to continue 3 more sessions by August 11NB, also re certify for another 6 weeks for aphasia. CB [phone]

## 2017-03-17 ENCOUNTER — Ambulatory Visit (INDEPENDENT_AMBULATORY_CARE_PROVIDER_SITE_OTHER): Payer: PPO | Admitting: *Deleted

## 2017-03-17 DIAGNOSIS — I639 Cerebral infarction, unspecified: Secondary | ICD-10-CM

## 2017-03-23 LAB — CUP PACEART REMOTE DEVICE CHECK
MDC IDC PG IMPLANT DT: 20180112
MDC IDC SESS DTM: 20180811004036

## 2017-03-25 NOTE — Progress Notes (Signed)
Loop recorder summary report

## 2017-03-27 DIAGNOSIS — I63432 Cerebral infarction due to embolism of left posterior cerebral artery: Secondary | ICD-10-CM | POA: Diagnosis not present

## 2017-04-03 ENCOUNTER — Encounter (HOSPITAL_COMMUNITY): Payer: PPO

## 2017-04-03 ENCOUNTER — Ambulatory Visit (HOSPITAL_COMMUNITY)
Admission: RE | Admit: 2017-04-03 | Discharge: 2017-04-03 | Disposition: A | Discharge status: Home / Self Care | Payer: PPO | Source: Ambulatory Visit | Attending: Nurse Practitioner | Admitting: Nurse Practitioner

## 2017-04-03 DIAGNOSIS — I82532 Chronic embolism and thrombosis of left popliteal vein: Secondary | ICD-10-CM | POA: Insufficient documentation

## 2017-04-03 DIAGNOSIS — I639 Cerebral infarction, unspecified: Secondary | ICD-10-CM | POA: Diagnosis not present

## 2017-04-03 DIAGNOSIS — I82432 Acute embolism and thrombosis of left popliteal vein: Secondary | ICD-10-CM

## 2017-04-03 DIAGNOSIS — Z8673 Personal history of transient ischemic attack (TIA), and cerebral infarction without residual deficits: Secondary | ICD-10-CM | POA: Diagnosis not present

## 2017-04-03 NOTE — Progress Notes (Signed)
*  Preliminary Results* Left lower extremity venous duplex completed. Left lower extremity is negative for acute deep vein thrombosis. There is evidence of chronic deep vein thrombosis involving the left popliteal vein. There is no evidence of left Baker's cyst.  04/03/2017 2:11 PM  Maudry Mayhew, BS, RVT, RDCS, RDMS

## 2017-04-04 DIAGNOSIS — I69314 Frontal lobe and executive function deficit following cerebral infarction: Secondary | ICD-10-CM | POA: Diagnosis not present

## 2017-04-04 DIAGNOSIS — F329 Major depressive disorder, single episode, unspecified: Secondary | ICD-10-CM | POA: Diagnosis not present

## 2017-04-04 DIAGNOSIS — F419 Anxiety disorder, unspecified: Secondary | ICD-10-CM | POA: Diagnosis not present

## 2017-04-04 DIAGNOSIS — I69320 Aphasia following cerebral infarction: Secondary | ICD-10-CM | POA: Diagnosis not present

## 2017-04-05 ENCOUNTER — Telehealth: Payer: Self-pay | Admitting: *Deleted

## 2017-04-05 NOTE — Telephone Encounter (Signed)
-----   Message from Dennie Bible, NP sent at 04/04/2017  7:34 PM EDT ----- No evidence of acute deep vein thrombosis involving the left lower extremity and right common femoral vein. - Chronic deep vein thrombosis involving the left popliteal vein. - No evidence of Baker&'s cyst on the left. Please call the pt.

## 2017-04-05 NOTE — Telephone Encounter (Signed)
Noted.  Butch Penny, daughter aware that pt to remain on eliquis).

## 2017-04-05 NOTE — Telephone Encounter (Signed)
Spoke to Stephanie [name], daughter (ok per DPR). Gave her the results of the [place] as per below per CM/NP.  She is taking eliquis now, Butch Penny had question re: staying on eliquis or go back to aspirin.  I relayed that per ovf note that she is to continue eliquis (still has thrombosis (chronic) in L popliteal vein.  I would verify and call back if any change. She verbalized understanding.

## 2017-04-05 NOTE — Telephone Encounter (Signed)
She will need to stay on Eliquis

## 2017-04-11 ENCOUNTER — Other Ambulatory Visit: Payer: Self-pay | Admitting: Family Medicine

## 2017-04-11 DIAGNOSIS — I63412 Cerebral infarction due to embolism of left middle cerebral artery: Secondary | ICD-10-CM

## 2017-04-11 DIAGNOSIS — R4701 Aphasia: Secondary | ICD-10-CM

## 2017-04-14 DIAGNOSIS — I10 Essential (primary) hypertension: Secondary | ICD-10-CM | POA: Insufficient documentation

## 2017-04-17 ENCOUNTER — Ambulatory Visit (INDEPENDENT_AMBULATORY_CARE_PROVIDER_SITE_OTHER): Payer: PPO | Admitting: Family Medicine

## 2017-04-17 ENCOUNTER — Ambulatory Visit (INDEPENDENT_AMBULATORY_CARE_PROVIDER_SITE_OTHER): Payer: PPO | Admitting: *Deleted

## 2017-04-17 ENCOUNTER — Encounter: Payer: Self-pay | Admitting: Family Medicine

## 2017-04-17 VITALS — BP 118/70 | HR 99 | Temp 98.6°F | Resp 16 | Ht 62.0 in | Wt 119.0 lb

## 2017-04-17 DIAGNOSIS — E782 Mixed hyperlipidemia: Secondary | ICD-10-CM | POA: Diagnosis not present

## 2017-04-17 DIAGNOSIS — J449 Chronic obstructive pulmonary disease, unspecified: Secondary | ICD-10-CM

## 2017-04-17 DIAGNOSIS — I639 Cerebral infarction, unspecified: Secondary | ICD-10-CM

## 2017-04-17 DIAGNOSIS — R4701 Aphasia: Secondary | ICD-10-CM | POA: Diagnosis not present

## 2017-04-17 DIAGNOSIS — I63412 Cerebral infarction due to embolism of left middle cerebral artery: Secondary | ICD-10-CM | POA: Diagnosis not present

## 2017-04-17 NOTE — Progress Notes (Signed)
Carelink Summary Report / Loop Recorder 

## 2017-04-17 NOTE — Progress Notes (Signed)
Patient: Stephanie [name] Female    DOB: 1936-01-06   81 y.o.   MRN: [id number] Visit Date: 04/17/2017  Today's Provider: Vernie Murders, PA   Chief Complaint  Patient presents with  . Follow-up  . Hypertension  . Cerebrovascular Accident   Subjective:    HPI   Hypertension, follow-up:  BP Readings from Last 3 Encounters:  04/17/17 118/70  03/06/17 134/69  01/09/17 120/64    She was last seen for hypertension 3 months ago.  BP at that visit was 120/64. Management since that visit includes; no changes.She reports good compliance with treatment. She is not having side effects. none She is exercising. She is adherent to low salt diet.   Outside blood pressures are normal. She is experiencing none.  Patient denies none.   Cardiovascular risk factors include none.  Use of agents associated with hypertension: none.   ----------------------------------------------------------------  CVA From 01/09/2017-Mrs. Tabbert had a CVA in January of 2018. She is taking Eliquis for this problem. This is being F/B by neurology.   Past Medical History:  Diagnosis Date  . Arthritis   . Complication of anesthesia   . COPD (chronic obstructive pulmonary disease) ([place])   . Coronary artery disease 2013   stent  . Depression   . Edema   . Pneumonia    IN PAST  . PONV (postoperative nausea and vomiting)   . Stroke [facility])    Past Surgical History:  Procedure Laterality Date  . BALLOON ANGIOPLASTY, ARTERY  05/17/2009  . CATARACT EXTRACTION W/PHACO Left 05/24/2016   Procedure: CATARACT EXTRACTION PHACO AND INTRAOCULAR LENS PLACEMENT (IOC);  Surgeon: Birder Robson, MD;  Location: [facility] ORS;  Service: Ophthalmology;  Laterality: Left;  Lot# [id number] H [place]:   00:52.3 AP%:   27.3 CDE:  14.26   . CATARACT EXTRACTION W/PHACO Right 06/14/2016   Procedure: CATARACT EXTRACTION PHACO AND INTRAOCULAR LENS PLACEMENT (IOC);  Surgeon: Birder Robson, MD;  Location: [facility] ORS;   Service: Ophthalmology;  Laterality: Right;  Lot# [id number] H [place]: 01:13.4 AP%: 24.4 CDE: 17.85  . CORONARY ANGIOPLASTY     STENT  . EP IMPLANTABLE DEVICE N/A 08/19/2016   Procedure: Loop Recorder Insertion;  Surgeon: Will Meredith Leeds, MD;  Location: [place] CV LAB;  Service: Cardiovascular;  Laterality: N/A;   Family History  Problem Relation Age of Onset  . Hypertension Mother   . Diabetes Mother   . Pancreatic cancer Mother 65  . Cancer Mother   . Suicidality Father 70  . Hypertension Father   . Alcohol abuse Father   . Hypertension Sister   . Heart disease Sister   . Kidney disease Sister   . Varicose Veins Sister   . Parkinson's disease Sister    Allergies  Allergen Reactions  . Duloxetine Hcl     unknown  . Influenza Vaccines     unknown  . Venlafaxine     GI distress     Current Outpatient Prescriptions:  .  acetaminophen (TYLENOL) 500 MG tablet, Take 1 tablet by mouth 2 (two) times daily as needed. , Disp: , Rfl:  .  albuterol (VENTOLIN HFA) 108 (90 Base) MCG/ACT inhaler, Inhale 2 puffs into the lungs every 6 (six) hours as needed., Disp: 3 Inhaler, Rfl: 3 .  Calcium Carb-Cholecalciferol (CALCIUM 600+D) 600-800 MG-UNIT TABS, Take 2 tablets by mouth daily., Disp: , Rfl:  .  clonazePAM (KLONOPIN) 0.5 MG tablet, TAKE 1 TABLET BY MOUTH TWICE DAILY AS NEEDED FOR ANXIETY, Disp:  180 tablet, Rfl: 5 .  docusate sodium (COLACE) 100 MG capsule, Take 200 mg by mouth at bedtime., Disp: , Rfl:  .  ELIQUIS 5 MG TABS tablet, TAKE ONE (1) TABLET BY MOUTH TWO (2) TIMES DAILY, Disp: 180 tablet, Rfl: 3 .  gabapentin (NEURONTIN) 100 MG capsule, Take 1 capsule (100 mg total) by mouth 3 (three) times daily. (Patient taking differently: Take 100 mg by mouth daily. ), Disp: 90 capsule, Rfl: 3 .  PARoxetine (PAXIL) 10 MG tablet, TAKE TWO TABLETS BY MOUTH AT BEDTIME, Disp: 60 tablet, Rfl: 6 .  Spacer/Aero Chamber Mouthpiece MISC, Use spacer on inhalers for better administration., Disp: 1  each, Rfl: 1 .  SPIRIVA HANDIHALER 18 MCG inhalation capsule, INHALE CONTENTS OF ONE CAPSULE ONCE A DAY AS DIRECTED, Disp: 90 capsule, Rfl: 4 .  SYMBICORT 160-4.5 MCG/ACT inhaler, INHALE TWO PUFFS INTO THE LUNGS TWICE A DAY, Disp: 30.6 g, Rfl: 3  Current Facility-Administered Medications:  .  ipratropium-albuterol (DUONEB) 0.5-2.5 (3) MG/3ML nebulizer solution 3 mL, 3 mL, Nebulization, Q4H PRN, Hower, Aaron Mose, MD  Review of Systems  Constitutional: Negative for appetite change, chills, fatigue and fever.  Respiratory: Negative for chest tightness and shortness of breath.   Cardiovascular: Negative for chest pain and palpitations.  Gastrointestinal: Negative for abdominal pain, nausea and vomiting.  Neurological: Negative for dizziness and weakness.    Social History  Substance Use Topics  . Smoking status: Former Smoker    Quit date: 08/07/2001  . Smokeless tobacco: Never Used     Comment: QUIT IN 2002  . Alcohol use No   Objective:   BP 118/70 (BP Location: Right Arm, Patient Position: Sitting, Cuff Size: Normal)   Pulse 99   Temp 98.6 F (37 C) (Oral)   Resp 16   Ht 5\' 2"  (1.575 m)   Wt 119 lb (54 kg)   SpO2 96%   BMI 21.77 kg/m  Vitals:   04/17/17 1105  BP: 118/70  Pulse: 99  Resp: 16  Temp: 98.6 F (37 C)  TempSrc: Oral  SpO2: 96%  Weight: 119 lb (54 kg)  Height: 5\' 2"  (1.575 m)     Physical Exam  Constitutional: She appears well-developed and well-nourished. No distress.  HENT:  Head: Normocephalic and atraumatic.  Right Ear: Hearing normal.  Left Ear: Hearing normal.  Nose: Nose normal.  Eyes: Pupils are equal, round, and reactive to light. Conjunctivae and lids are normal. Right eye exhibits no discharge. Left eye exhibits no discharge. No scleral icterus.  Neck: Neck supple.  Cardiovascular: Normal rate and regular rhythm.   Pulmonary/Chest: Effort normal and breath sounds normal. No respiratory distress.  Still has a loop recorder in the left upper  chest.  Abdominal: Soft. Bowel sounds are normal.  Musculoskeletal: Normal range of motion.  Neurological:  Still has expressive aphasia but good spirits. Unable to fully assess orientation.  Skin: Skin is intact. No lesion and no rash noted.  Psychiatric: She has a normal mood and affect. Her speech is normal and behavior is normal. Thought content normal.      Assessment & Plan:     1. Cerebrovascular accident (CVA) due to embolism of left middle cerebral artery (HCC) Left middle cerebral artery branch infarct in January 8250 of embolic etiology likely from paradoxical embolism from patent foramen ovale in the presence of acute deep vein thrombosis in the leg. No extremity weakness. Expressive aphasia only remaining effect. Eating well and sleeping well. Family very attentive and  feels she is doing very well physically. Will check routine labs. Followed by Dr. Leonie Man (neurologist). - CBC with Differential/Platelet - Comprehensive metabolic panel - Lipid panel - TSH  2. Expressive aphasia No worsening. Essentially unchanged. Followed by Dr. Leonie Man (neurologist). Has been followed by speech therapist at home but family and patient feels she is stable without evidence of further improvement. Family feels she does not need any additional visits. Recheck labs. - CBC with Differential/Platelet - Comprehensive metabolic panel - Lipid panel - TSH  3. COPD, moderate ([place]) Stable with fair control using Symbicort BID, Spiriva qd and prn Albuterol. Recheck routine labs and follow up pending reports. - CBC with Differential/Platelet - Comprehensive metabolic panel  4. Mixed hyperlipidemia Total cholesterol 222 with HDL 103, triglycerides 100 and LDL 99. Will check labs and encouraged to restrict fats in diet. Walking for exercise now. - Comprehensive metabolic panel - Lipid panel - TSH       Vernie Murders, PA  [facility]

## 2017-04-20 LAB — CUP PACEART REMOTE DEVICE CHECK
Implantable Pulse Generator Implant Date: 20180112
MDC IDC SESS DTM: 20180910004244

## 2017-04-27 DIAGNOSIS — J449 Chronic obstructive pulmonary disease, unspecified: Secondary | ICD-10-CM | POA: Diagnosis not present

## 2017-04-27 DIAGNOSIS — I63412 Cerebral infarction due to embolism of left middle cerebral artery: Secondary | ICD-10-CM | POA: Diagnosis not present

## 2017-04-27 DIAGNOSIS — R4701 Aphasia: Secondary | ICD-10-CM | POA: Diagnosis not present

## 2017-04-27 DIAGNOSIS — E782 Mixed hyperlipidemia: Secondary | ICD-10-CM | POA: Diagnosis not present

## 2017-04-27 LAB — CBC WITH DIFFERENTIAL/PLATELET
BASOS PCT: 0.7 %
Basophils Absolute: 39 cells/uL (ref 0–200)
Eosinophils Absolute: 129 cells/uL (ref 15–500)
Eosinophils Relative: 2.3 %
HCT: 33.5 % — ABNORMAL LOW (ref 35.0–45.0)
HEMOGLOBIN: 11.1 g/dL — AB (ref 11.7–15.5)
Lymphs Abs: 1456 cells/uL (ref 850–3900)
MCH: 31.2 pg (ref 27.0–33.0)
MCHC: 33.1 g/dL (ref 32.0–36.0)
MCV: 94.1 fL (ref 80.0–100.0)
MONOS PCT: 8.9 %
MPV: 10.2 fL (ref 7.5–12.5)
NEUTROS ABS: 3478 {cells}/uL (ref 1500–7800)
Neutrophils Relative %: 62.1 %
PLATELETS: 271 10*3/uL (ref 140–400)
RBC: 3.56 10*6/uL — AB (ref 3.80–5.10)
RDW: 12.3 % (ref 11.0–15.0)
TOTAL LYMPHOCYTE: 26 %
WBC mixed population: 498 cells/uL (ref 200–950)
WBC: 5.6 10*3/uL (ref 3.8–10.8)

## 2017-04-27 LAB — TSH: TSH: 1.84 m[IU]/L (ref 0.40–4.50)

## 2017-04-27 LAB — COMPREHENSIVE METABOLIC PANEL
AG RATIO: 1.8 (calc) (ref 1.0–2.5)
ALBUMIN MSPROF: 4.1 g/dL (ref 3.6–5.1)
ALT: 8 U/L (ref 6–29)
AST: 16 U/L (ref 10–35)
Alkaline phosphatase (APISO): 83 U/L (ref 33–130)
BUN / CREAT RATIO: 24 (calc) — AB (ref 6–22)
BUN: 30 mg/dL — ABNORMAL HIGH (ref 7–25)
CHLORIDE: 107 mmol/L (ref 98–110)
CO2: 28 mmol/L (ref 20–32)
CREATININE: 1.25 mg/dL — AB (ref 0.60–0.93)
Calcium: 10 mg/dL (ref 8.6–10.4)
GLOBULIN: 2.3 g/dL (ref 1.9–3.7)
GLUCOSE: 94 mg/dL (ref 65–99)
POTASSIUM: 4.9 mmol/L (ref 3.5–5.3)
Sodium: 142 mmol/L (ref 135–146)
Total Bilirubin: 0.3 mg/dL (ref 0.2–1.2)
Total Protein: 6.4 g/dL (ref 6.1–8.1)

## 2017-04-27 LAB — LIPID PANEL
Cholesterol: 236 mg/dL — ABNORMAL HIGH (ref <200)
HDL: 130 mg/dL (ref >50)
LDL Cholesterol (Calc): 89 mg/dL (calc)
Non-HDL Cholesterol (Calc): 106 mg/dL (calc) (ref <130)
Total CHOL/HDL Ratio: 1.8 (calc) (ref <5.0)
Triglycerides: 76 mg/dL (ref <150)

## 2017-04-27 LAB — SPECIMEN ID NOTIFICATION MISSING 2ND ID

## 2017-04-28 ENCOUNTER — Telehealth: Payer: Self-pay

## 2017-04-28 NOTE — Telephone Encounter (Signed)
-----   Message from [employer], [place] sent at 04/28/2017  8:02 AM EDT ----- Hgb, Hct and RBC's a little low. Recommend multivitamin with iron and recheck levels in 3 months. Kidney function tests improving from last check. Drink plenty of water with diet.

## 2017-04-28 NOTE — Telephone Encounter (Signed)
Patient's daughter Butch Penny advised. 3 month follow up scheduled.

## 2017-04-28 NOTE — Telephone Encounter (Signed)
Left message for patient's daughter Butch Penny (on [place]) to return call.

## 2017-05-09 ENCOUNTER — Ambulatory Visit: Payer: PPO | Admitting: Family Medicine

## 2017-05-12 ENCOUNTER — Ambulatory Visit (INDEPENDENT_AMBULATORY_CARE_PROVIDER_SITE_OTHER): Payer: PPO | Admitting: Family Medicine

## 2017-05-12 ENCOUNTER — Encounter: Payer: Self-pay | Admitting: Family Medicine

## 2017-05-12 VITALS — BP 122/64 | HR 69 | Temp 97.8°F | Wt 118.8 lb

## 2017-05-12 DIAGNOSIS — L509 Urticaria, unspecified: Secondary | ICD-10-CM

## 2017-05-12 NOTE — Progress Notes (Signed)
Patient: Stephanie [name] Female    DOB: Jun 12, 1936   81 y.o.   MRN: [id number] Visit Date: 05/12/2017  Today's Provider: Vernie Murders, PA   Chief Complaint  Patient presents with  . Hand Pain   Subjective:    Hand Pain   Incident onset: last night. The injury mechanism is unknown. The pain is present in the left hand. The patient is experiencing no pain. Associated symptoms comments: Itching, redness and swelling . She has tried nothing for the symptoms.   Past Medical History:  Diagnosis Date  . Arthritis   . Complication of anesthesia   . COPD (chronic obstructive pulmonary disease) ([place])   . Coronary artery disease 2013   stent  . Depression   . Edema   . Pneumonia    IN PAST  . PONV (postoperative nausea and vomiting)   . Stroke [facility])    Past Surgical History:  Procedure Laterality Date  . BALLOON ANGIOPLASTY, ARTERY  05/17/2009  . CATARACT EXTRACTION W/PHACO Left 05/24/2016   Procedure: CATARACT EXTRACTION PHACO AND INTRAOCULAR LENS PLACEMENT (IOC);  Surgeon: Birder Robson, MD;  Location: [facility] ORS;  Service: Ophthalmology;  Laterality: Left;  Lot# [id number] H [place]:   00:52.3 AP%:   27.3 CDE:  14.26   . CATARACT EXTRACTION W/PHACO Right 06/14/2016   Procedure: CATARACT EXTRACTION PHACO AND INTRAOCULAR LENS PLACEMENT (IOC);  Surgeon: Birder Robson, MD;  Location: [facility] ORS;  Service: Ophthalmology;  Laterality: Right;  Lot# [id number] H [place]: 01:13.4 AP%: 24.4 CDE: 17.85  . CORONARY ANGIOPLASTY     STENT  . EP IMPLANTABLE DEVICE N/A 08/19/2016   Procedure: Loop Recorder Insertion;  Surgeon: Will Meredith Leeds, MD;  Location: [place] [facility];  Service: Cardiovascular;  Laterality: N/A;   Family History  Problem Relation Age of Onset  . Hypertension Mother   . Diabetes Mother   . Pancreatic cancer Mother 89  . Cancer Mother   . Suicidality Father 53  . Hypertension Father   . Alcohol abuse Father   . Hypertension Sister   . Heart disease Sister   .  Kidney disease Sister   . Varicose Veins Sister   . Parkinson's disease Sister    Allergies  Allergen Reactions  . Duloxetine Hcl     unknown  . Influenza Vaccines     unknown  . Venlafaxine     GI distress     Previous Medications   ACETAMINOPHEN (TYLENOL) 500 MG TABLET    Take 1 tablet by mouth 2 (two) times daily as needed.    ALBUTEROL (VENTOLIN HFA) 108 (90 BASE) MCG/ACT INHALER    Inhale 2 puffs into the lungs every 6 (six) hours as needed.   CALCIUM CARB-CHOLECALCIFEROL (CALCIUM 600+D) 600-800 MG-UNIT TABS    Take 2 tablets by mouth daily.   CLONAZEPAM (KLONOPIN) 0.5 MG TABLET    TAKE 1 TABLET BY MOUTH TWICE DAILY AS NEEDED FOR ANXIETY   DOCUSATE SODIUM (COLACE) 100 MG CAPSULE    Take 200 mg by mouth at bedtime.   ELIQUIS 5 MG TABS TABLET    TAKE ONE (1) TABLET BY MOUTH TWO (2) TIMES DAILY   GABAPENTIN (NEURONTIN) 100 MG CAPSULE    Take 1 capsule (100 mg total) by mouth 3 (three) times daily.   MULTIPLE VITAMINS-IRON (MULTIVITAMIN/IRON PO)    Take by mouth.   PAROXETINE (PAXIL) 10 MG TABLET    TAKE TWO TABLETS BY MOUTH AT BEDTIME   SPACER/AERO CHAMBER MOUTHPIECE MISC    Use  spacer on inhalers for better administration.   SPIRIVA HANDIHALER 18 MCG INHALATION CAPSULE    INHALE CONTENTS OF ONE CAPSULE ONCE A DAY AS DIRECTED   SYMBICORT 160-4.5 MCG/ACT INHALER    INHALE TWO PUFFS INTO THE LUNGS TWICE A DAY    Review of Systems  Constitutional: Negative.   Respiratory: Negative.   Cardiovascular: Negative.   Skin:       Left hand redness and itching     Social History  Substance Use Topics  . Smoking status: Former Smoker    Quit date: 08/07/2001  . Smokeless tobacco: Never Used     Comment: QUIT IN 2002  . Alcohol use No   Objective:   BP 122/64 (BP Location: Right Arm, Patient Position: Sitting, Cuff Size: Normal)   Pulse 69   Temp 97.8 F (36.6 C) (Oral)   Wt 118 lb 12.8 oz (53.9 kg)   SpO2 97%   BMI 21.73 kg/m   Physical Exam  Constitutional: She is  oriented to person, place, and time. She appears well-developed and well-nourished. No distress.  HENT:  Head: Normocephalic and atraumatic.  Right Ear: Hearing normal.  Left Ear: Hearing normal.  Nose: Nose normal.  Eyes: Conjunctivae and lids are normal. Right eye exhibits no discharge. Left eye exhibits no discharge. No scleral icterus.  Cardiovascular: Normal rate and regular rhythm.   Pulmonary/Chest: Effort normal and breath sounds normal. No respiratory distress.  Musculoskeletal: Normal range of motion.  Neurological: She is alert and oriented to person, place, and time.  Skin: Skin is intact. Rash noted. No lesion noted.  Hives on left hand and both forearms in a 5x7 cm or larger area each.  Psychiatric: She has a normal mood and affect. Her speech is normal and behavior is normal. Thought content normal.      Assessment & Plan:     1. Urticaria Has had itching whelps on left hand and both forearms over the past week or two. Family noticed it starting after beginning the Vitamin with Iron daily. May discontinue and try children's benadryl or Claritin. Family denies evidence of swallowing or breathing difficulties. Recheck if any difficulty swallowing or breathing occurs. Asked family to bring in a copy of the DNR they have at home from the SNF she was in January 2018.

## 2017-05-16 ENCOUNTER — Ambulatory Visit (INDEPENDENT_AMBULATORY_CARE_PROVIDER_SITE_OTHER): Payer: PPO | Admitting: *Deleted

## 2017-05-16 DIAGNOSIS — I639 Cerebral infarction, unspecified: Secondary | ICD-10-CM | POA: Diagnosis not present

## 2017-05-17 ENCOUNTER — Telehealth: Payer: Self-pay | Admitting: Family Medicine

## 2017-05-17 ENCOUNTER — Other Ambulatory Visit: Payer: Self-pay | Admitting: Family Medicine

## 2017-05-17 DIAGNOSIS — I63412 Cerebral infarction due to embolism of left middle cerebral artery: Secondary | ICD-10-CM

## 2017-05-17 DIAGNOSIS — R4701 Aphasia: Secondary | ICD-10-CM

## 2017-05-17 NOTE — Telephone Encounter (Signed)
Please advise 

## 2017-05-17 NOTE — Progress Notes (Signed)
Carelink Summary Report / Loop Recorder 

## 2017-05-17 NOTE — Telephone Encounter (Signed)
Pt daughter Stephanie [name] called states pt was seen last week for the hives.  Pt is still having itching and redness on her arms.  Pt daughter states she is taking a Childs strength over the counter claritin.  She is asking if pt can take the adult strength to help with the itching.  CB#[phone]/MW

## 2017-05-18 ENCOUNTER — Telehealth: Payer: Self-pay | Admitting: Family Medicine

## 2017-05-18 LAB — CUP PACEART REMOTE DEVICE CHECK
Implantable Pulse Generator Implant Date: 20180112
MDC IDC SESS DTM: 20181010014023

## 2017-05-18 NOTE — Telephone Encounter (Signed)
Will need to see this tomorrow to know what is happening.

## 2017-05-18 NOTE — Telephone Encounter (Signed)
Stephanie [name] stated she was returning nurse call. Please advise. Thanks TNP

## 2017-05-18 NOTE — Telephone Encounter (Signed)
Pt daughter Stephanie [name] states pt has places on her arms and hand that have blisters. The itching and swelling are better but they are concerned with the blisters.  [employer],  CB#[phone]-after 12 call Butch Penny CB#[phone]/MW

## 2017-05-18 NOTE — Telephone Encounter (Signed)
Please advise. Patient's daughter was notified earlier that pt can that full dose clartin or zyrtec 10 mg.

## 2017-05-18 NOTE — Telephone Encounter (Signed)
May increase to the full strength Claritin or switch to the Zyrtec 10 mg qd. Recheck if rash persists. May need a prednisone dose pack.

## 2017-05-18 NOTE — Telephone Encounter (Signed)
Stephanie [name] was notified.

## 2017-05-19 NOTE — Telephone Encounter (Signed)
Agree with this plan.

## 2017-05-19 NOTE — Telephone Encounter (Signed)
Called Stephanie [name] back. Stephanie [name] states she went over to her mothers house yesterday and she did not see any blisters. Stephanie [name] stated rash looks the same as it did when pt was last seen in office. Rash has come up on right arm but it looks the same, just bumps. Stephanie [name] wants pt to try the allergy medications first to see if it helps before bringing pt into office.

## 2017-05-22 ENCOUNTER — Encounter: Payer: Self-pay | Admitting: Family Medicine

## 2017-05-22 ENCOUNTER — Ambulatory Visit (INDEPENDENT_AMBULATORY_CARE_PROVIDER_SITE_OTHER): Payer: PPO | Admitting: Family Medicine

## 2017-05-22 VITALS — BP 110/60 | HR 74 | Temp 98.3°F | Wt 118.2 lb

## 2017-05-22 DIAGNOSIS — L509 Urticaria, unspecified: Secondary | ICD-10-CM

## 2017-05-22 NOTE — Progress Notes (Signed)
   Patient: Stephanie [name] Female    DOB: 10/01/1935   81 y.o.   MRN: [id number] Visit Date: 05/22/2017  Today's Provider: Vernie Murders, PA   Chief Complaint  Patient presents with  . Rash   Subjective:    Rash  This is a new problem. The current episode started 1 to 4 weeks ago. The problem has been waxing and waning since onset. The affected locations include the right arm and left arm. The rash is characterized by blistering and redness. Past treatments include anti-itch cream (Zyrtec ). The treatment provided mild relief.      Previous Medications   ACETAMINOPHEN (TYLENOL) 500 MG TABLET    Take 1 tablet by mouth 2 (two) times daily as needed.    ALBUTEROL (VENTOLIN HFA) 108 (90 BASE) MCG/ACT INHALER    Inhale 2 puffs into the lungs every 6 (six) hours as needed.   CALCIUM CARB-CHOLECALCIFEROL (CALCIUM 600+D) 600-800 MG-UNIT TABS    Take 2 tablets by mouth daily.   CLONAZEPAM (KLONOPIN) 0.5 MG TABLET    TAKE 1 TABLET BY MOUTH TWICE DAILY AS NEEDED FOR ANXIETY   DOCUSATE SODIUM (COLACE) 100 MG CAPSULE    Take 200 mg by mouth at bedtime.   ELIQUIS 5 MG TABS TABLET    TAKE ONE (1) TABLET BY MOUTH TWO (2) TIMES DAILY   GABAPENTIN (NEURONTIN) 100 MG CAPSULE    TAKE 1 CAPSULE BY MOUTH THREE TIMES A DAY   MULTIPLE VITAMINS-IRON (MULTIVITAMIN/IRON PO)    Take by mouth.   PAROXETINE (PAXIL) 10 MG TABLET    TAKE TWO TABLETS BY MOUTH AT BEDTIME   SPACER/AERO CHAMBER MOUTHPIECE MISC    Use spacer on inhalers for better administration.   SPIRIVA HANDIHALER 18 MCG INHALATION CAPSULE    INHALE CONTENTS OF ONE CAPSULE ONCE A DAY AS DIRECTED   SYMBICORT 160-4.5 MCG/ACT INHALER    INHALE TWO PUFFS INTO THE LUNGS TWICE A DAY    Review of Systems  Constitutional: Negative.   Respiratory: Negative.   Cardiovascular: Negative.   Skin: Positive for rash.    Social History  Substance Use Topics  . Smoking status: Former Smoker    Quit date: 08/07/2001  . Smokeless tobacco: Never Used     Comment: QUIT IN 2002  . Alcohol use No   Objective:   BP 110/60 (BP Location: Right Arm, Patient Position: Sitting, Cuff Size: Normal)   Pulse 74   Temp 98.3 F (36.8 C) (Oral)   Wt 118 lb 3.2 oz (53.6 kg)   SpO2 97%   BMI 21.62 kg/m   Physical Exam  Constitutional: She appears well-developed and well-nourished.  Cardiovascular: Normal rate and regular rhythm.   Pulmonary/Chest: Breath sounds normal.  Abdominal: Bowel sounds are normal.  Skin: Rash noted.  Pink raised lesion approximately 1/2-3/4 cm scattered on the arms bilaterally.       Assessment & Plan:     1. Urticaria Rash is beginning to fade with no whelps today. 3-4 3/4 cm slightly itchy raised lesions on both arms. Most lesions are in the drying/fading stage and less itching than the urticaria whelps seen at the last OV. Finding the Zyrtec is calming the itch. May use Hydrocortisone on individual lesion. Recheck if needed in 7-10 days. If unresolved, may need dermatology referrral.

## 2017-06-15 ENCOUNTER — Ambulatory Visit (INDEPENDENT_AMBULATORY_CARE_PROVIDER_SITE_OTHER): Payer: PPO | Admitting: *Deleted

## 2017-06-15 DIAGNOSIS — I639 Cerebral infarction, unspecified: Secondary | ICD-10-CM | POA: Diagnosis not present

## 2017-06-16 NOTE — Progress Notes (Signed)
Carelink Summary Report / Loop Recorder 

## 2017-06-22 LAB — CUP PACEART REMOTE DEVICE CHECK
Implantable Pulse Generator Implant Date: 20180112
MDC IDC SESS DTM: 20181109023927

## 2017-06-26 ENCOUNTER — Telehealth: Payer: Self-pay

## 2017-06-26 NOTE — Telephone Encounter (Signed)
Pt's daughter Butch Penny returned call and was advised below. Thanks TNP

## 2017-06-26 NOTE — Telephone Encounter (Signed)
Left message for patients' daughter Butch Penny. Form they dropped off for dental procedure is ready for pickup. Simona Huh recommends patient take Clonazepam 2 hours prior to procedure to help with anxiety and nervousness. Patient has a RX for Clonazepam already.

## 2017-06-27 ENCOUNTER — Telehealth: Payer: Self-pay | Admitting: Family Medicine

## 2017-06-27 MED ORDER — PAROXETINE HCL 10 MG PO TABS: 20.0000 mg | ORAL_TABLET | Freq: Every day | ORAL | 3 refills | Status: DC

## 2017-06-27 NOTE — Telephone Encounter (Signed)
Patient's daughter Butch Penny advised. She verbalized understanding. RX to reflect dose increase sent to [facility].

## 2017-06-27 NOTE — Telephone Encounter (Signed)
Butch Penny pt daughter has a question about the medication clonazePAM (KLONOPIN) 0.5 MG tablet   CB#[phone]/MW  Pt Daughter states since the time change pt is having a lot of anxiety and more confused.  She is asking if any thing else can be added to her medications to help with this/MW

## 2017-06-27 NOTE — Telephone Encounter (Signed)
Have to be careful that the Clonazepam does not cause a worsening of anxiety and memory in the elderly. Suggest increasing the Paroxetine to 20 mg daily for the next 4-6 weeks to help control anxiety/nervousness.

## 2017-07-17 ENCOUNTER — Ambulatory Visit (INDEPENDENT_AMBULATORY_CARE_PROVIDER_SITE_OTHER): Payer: PPO | Admitting: *Deleted

## 2017-07-17 DIAGNOSIS — I639 Cerebral infarction, unspecified: Secondary | ICD-10-CM

## 2017-07-17 NOTE — Progress Notes (Signed)
Carelink Summary Report / Loop Recorder 

## 2017-07-19 ENCOUNTER — Ambulatory Visit (INDEPENDENT_AMBULATORY_CARE_PROVIDER_SITE_OTHER): Payer: PPO | Admitting: Family Medicine

## 2017-07-19 ENCOUNTER — Encounter: Payer: Self-pay | Admitting: Family Medicine

## 2017-07-19 VITALS — BP 132/60 | HR 80 | Temp 98.1°F | Resp 20 | Wt 122.0 lb

## 2017-07-19 DIAGNOSIS — H1089 Other conjunctivitis: Secondary | ICD-10-CM

## 2017-07-19 MED ORDER — CIPROFLOXACIN HCL 0.3 % OP SOLN: 1.0000 [drp] | OPHTHALMIC | 0 refills | Status: AC

## 2017-07-19 NOTE — Progress Notes (Signed)
Patient: Stephanie [name] Female    DOB: 10-15-1935   81 y.o.   MRN: [id number] Visit Date: 07/19/2017  Today's Provider: Lelon Huh, MD   Chief Complaint  Patient presents with  . Eye Pain   Subjective:    Eye Pain   The left eye is affected. This is a new problem. The current episode started in the past 7 days (about 5 days). Associated symptoms include an eye discharge, eye redness and itching. Pertinent negatives include no fever, nausea, vomiting or weakness.   Patient has not used anything OTC for her symptoms. She noticed some white drainage in her left eye this morning.     Allergies  Allergen Reactions  . Duloxetine Hcl     unknown  . Influenza Vaccines     unknown  . Venlafaxine     GI distress     Current Outpatient Medications:  .  acetaminophen (TYLENOL) 500 MG tablet, Take 1 tablet by mouth 2 (two) times daily as needed. , Disp: , Rfl:  .  albuterol (VENTOLIN HFA) 108 (90 Base) MCG/ACT inhaler, Inhale 2 puffs into the lungs every 6 (six) hours as needed., Disp: 3 Inhaler, Rfl: 3 .  Calcium Carb-Cholecalciferol (CALCIUM 600+D) 600-800 MG-UNIT TABS, Take 2 tablets by mouth daily., Disp: , Rfl:  .  clonazePAM (KLONOPIN) 0.5 MG tablet, TAKE 1 TABLET BY MOUTH TWICE DAILY AS NEEDED FOR ANXIETY, Disp: 180 tablet, Rfl: 5 .  docusate sodium (COLACE) 100 MG capsule, Take 200 mg by mouth at bedtime., Disp: , Rfl:  .  ELIQUIS 5 MG TABS tablet, TAKE ONE (1) TABLET BY MOUTH TWO (2) TIMES DAILY, Disp: 180 tablet, Rfl: 3 .  gabapentin (NEURONTIN) 100 MG capsule, TAKE 1 CAPSULE BY MOUTH THREE TIMES A DAY, Disp: 90 capsule, Rfl: 3 .  Multiple Vitamins-Iron (MULTIVITAMIN/IRON PO), Take by mouth., Disp: , Rfl:  .  PARoxetine (PAXIL) 10 MG tablet, Take 2 tablets (20 mg total) by mouth at bedtime. Take 1 tablet in the morning and 2 tablets at bedtime (Patient taking differently: Take 20 mg by mouth. Take 1 tablet in the morning and 2 tablets at bedtime), Disp: 90  tablet, Rfl: 3 .  Spacer/Aero Chamber Mouthpiece MISC, Use spacer on inhalers for better administration., Disp: 1 each, Rfl: 1 .  SPIRIVA HANDIHALER 18 MCG inhalation capsule, INHALE CONTENTS OF ONE CAPSULE ONCE A DAY AS DIRECTED, Disp: 90 capsule, Rfl: 4 .  SYMBICORT 160-4.5 MCG/ACT inhaler, INHALE TWO PUFFS INTO THE LUNGS TWICE A DAY, Disp: 30.6 g, Rfl: 3  Current Facility-Administered Medications:  .  ipratropium-albuterol (DUONEB) 0.5-2.5 (3) MG/3ML nebulizer solution 3 mL, 3 mL, Nebulization, Q4H PRN, Hower, Aaron Mose, MD  Review of Systems  Constitutional: Negative for appetite change, chills, fatigue and fever.  HENT: Negative for congestion.   Eyes: Positive for pain, discharge, redness and itching.  Respiratory: Negative for cough, chest tightness and shortness of breath.   Cardiovascular: Negative for chest pain and palpitations.  Gastrointestinal: Negative for abdominal pain, nausea and vomiting.  Neurological: Negative for dizziness and weakness.    Social History   Tobacco Use  . Smoking status: Former Smoker    Last attempt to quit: 08/07/2001    Years since quitting: 15.9  . Smokeless tobacco: Never Used  . Tobacco comment: QUIT IN 2002  Substance Use Topics  . Alcohol use: No    Alcohol/week: 0.0 oz   Objective:   BP 132/60 (BP Location: Right Arm,  Patient Position: Sitting, Cuff Size: Normal)   Pulse 80   Temp 98.1 F (36.7 C)   Resp 20   Wt 122 lb (55.3 kg)   SpO2 99%   BMI 22.31 kg/m  Vitals:   07/19/17 1625  BP: 132/60  Pulse: 80  Resp: 20  Temp: 98.1 F (36.7 C)  SpO2: 99%  Weight: 122 lb (55.3 kg)     Physical Exam  General Appearance:    Alert, cooperative, no distress  HENT:   nasal mucosa pale and congested.   Eyes:    PERRL, conjunctiva/corneas clear, EOM's intact. Both eyes mildly injected, left > right  Lungs:     Clear to auscultation bilaterally, respirations unlabored  Heart:    Regular rate and rhythm  Neurologic:   Awake,  alert, oriented x 3. No apparent focal neurological           defect.           Assessment & Plan:     1. Other conjunctivitis of both eyes  - ciprofloxacin (CILOXAN) 0.3 % ophthalmic solution; Place 1 drop into both eyes every 4 (four) hours while awake for 7 days.  Dispense: 5 mL; Refill: 0      Lelon Huh, MD  [facility]

## 2017-07-20 LAB — CUP PACEART REMOTE DEVICE CHECK
Implantable Pulse Generator Implant Date: 20180112
MDC IDC SESS DTM: 20181209023956

## 2017-07-28 ENCOUNTER — Ambulatory Visit (INDEPENDENT_AMBULATORY_CARE_PROVIDER_SITE_OTHER): Payer: PPO | Admitting: Family Medicine

## 2017-07-28 ENCOUNTER — Encounter: Payer: Self-pay | Admitting: Family Medicine

## 2017-07-28 VITALS — BP 112/62 | HR 71 | Temp 97.8°F | Wt 119.8 lb

## 2017-07-28 DIAGNOSIS — I63412 Cerebral infarction due to embolism of left middle cerebral artery: Secondary | ICD-10-CM

## 2017-07-28 DIAGNOSIS — E782 Mixed hyperlipidemia: Secondary | ICD-10-CM

## 2017-07-28 DIAGNOSIS — D649 Anemia, unspecified: Secondary | ICD-10-CM | POA: Diagnosis not present

## 2017-07-28 DIAGNOSIS — R4701 Aphasia: Secondary | ICD-10-CM

## 2017-07-28 NOTE — Progress Notes (Signed)
Patient: Stephanie [name] Female    DOB: January 09, 1936   81 y.o.   MRN: [id number] Visit Date: 07/28/2017  Today's Provider: Vernie Murders, PA   Chief Complaint  Patient presents with  . Hypertension  . CVA  . Follow-up   Subjective:    HPI  Hypertension, follow-up:  BP Readings from Last 3 Encounters:  07/28/17 112/62  07/19/17 132/60  05/22/17 110/60    She was last seen for hypertension 3 months ago.  BP at that visit was 118/70. Management since that visit includes; no changes.She reports good compliance with treatment. She is not having side effects. none She is exercising. She is adherent to low salt diet.   Outside blood pressures are normal. She is experiencing none.  Patient denies none.   Cardiovascular risk factors include none.  Use of agents associated with hypertension: none.   ----------------------------------------------------------------  CVA Patient had a CVA in January 2018. She is currently taking Eliquis. Patient is being followed by Dr. Leonie Man. Her next follow up appointment is in January 2019. Patient's daughter states her speech is worsening.   Past Medical History:  Diagnosis Date  . Arthritis   . Complication of anesthesia   . COPD (chronic obstructive pulmonary disease) ([place])   . Coronary artery disease 2013   stent  . Depression   . Edema   . Pneumonia    IN PAST  . PONV (postoperative nausea and vomiting)   . Stroke [facility])    Past Surgical History:  Procedure Laterality Date  . BALLOON ANGIOPLASTY, ARTERY  05/17/2009  . CATARACT EXTRACTION W/PHACO Left 05/24/2016   Procedure: CATARACT EXTRACTION PHACO AND INTRAOCULAR LENS PLACEMENT (IOC);  Surgeon: Birder Robson, MD;  Location: [facility] ORS;  Service: Ophthalmology;  Laterality: Left;  Lot# [id number] H [place]:   00:52.3 AP%:   27.3 CDE:  14.26   . CATARACT EXTRACTION W/PHACO Right 06/14/2016   Procedure: CATARACT EXTRACTION PHACO AND INTRAOCULAR LENS PLACEMENT (IOC);   Surgeon: Birder Robson, MD;  Location: [facility] ORS;  Service: Ophthalmology;  Laterality: Right;  Lot# [id number] H [place]: 01:13.4 AP%: 24.4 CDE: 17.85  . CORONARY ANGIOPLASTY     STENT  . EP IMPLANTABLE DEVICE N/A 08/19/2016   Procedure: Loop Recorder Insertion;  Surgeon: Will Meredith Leeds, MD;  Location: [place] [facility];  Service: Cardiovascular;  Laterality: N/A;   Family History  Problem Relation Age of Onset  . Hypertension Mother   . Diabetes Mother   . Pancreatic cancer Mother 73  . Cancer Mother   . Suicidality Father 33  . Hypertension Father   . Alcohol abuse Father   . Hypertension Sister   . Heart disease Sister   . Kidney disease Sister   . Varicose Veins Sister   . Parkinson's disease Sister    Allergies  Allergen Reactions  . Duloxetine Hcl     unknown  . Influenza Vaccines     unknown  . Venlafaxine     GI distress     Current Outpatient Medications:  .  acetaminophen (TYLENOL) 500 MG tablet, Take 1 tablet by mouth 2 (two) times daily as needed. , Disp: , Rfl:  .  albuterol (VENTOLIN HFA) 108 (90 Base) MCG/ACT inhaler, Inhale 2 puffs into the lungs every 6 (six) hours as needed., Disp: 3 Inhaler, Rfl: 3 .  Calcium Carb-Cholecalciferol (CALCIUM 600+D) 600-800 MG-UNIT TABS, Take 2 tablets by mouth daily., Disp: , Rfl:  .  clonazePAM (KLONOPIN) 0.5 MG tablet, TAKE 1  TABLET BY MOUTH TWICE DAILY AS NEEDED FOR ANXIETY, Disp: 180 tablet, Rfl: 5 .  docusate sodium (COLACE) 100 MG capsule, Take 200 mg by mouth at bedtime., Disp: , Rfl:  .  ELIQUIS 5 MG TABS tablet, TAKE ONE (1) TABLET BY MOUTH TWO (2) TIMES DAILY, Disp: 180 tablet, Rfl: 3 .  gabapentin (NEURONTIN) 100 MG capsule, TAKE 1 CAPSULE BY MOUTH THREE TIMES A DAY, Disp: 90 capsule, Rfl: 3 .  Multiple Vitamins-Iron (MULTIVITAMIN/IRON PO), Take by mouth., Disp: , Rfl:  .  PARoxetine (PAXIL) 10 MG tablet, Take 2 tablets (20 mg total) by mouth at bedtime. Take 1 tablet in the morning and 2 tablets at bedtime  (Patient taking differently: Take 20 mg by mouth. Take 1 tablet in the morning and 2 tablets at bedtime), Disp: 90 tablet, Rfl: 3 .  Spacer/Aero Chamber Mouthpiece MISC, Use spacer on inhalers for better administration., Disp: 1 each, Rfl: 1 .  SPIRIVA HANDIHALER 18 MCG inhalation capsule, INHALE CONTENTS OF ONE CAPSULE ONCE A DAY AS DIRECTED, Disp: 90 capsule, Rfl: 4 .  SYMBICORT 160-4.5 MCG/ACT inhaler, INHALE TWO PUFFS INTO THE LUNGS TWICE A DAY, Disp: 30.6 g, Rfl: 3  Current Facility-Administered Medications:  .  ipratropium-albuterol (DUONEB) 0.5-2.5 (3) MG/3ML nebulizer solution 3 mL, 3 mL, Nebulization, Q4H PRN, Hower, Aaron Mose, MD  Review of Systems  Constitutional: Negative.   Respiratory: Negative.   Cardiovascular: Negative.   Neurological: Positive for speech difficulty.    Social History   Tobacco Use  . Smoking status: Former Smoker    Last attempt to quit: 08/07/2001    Years since quitting: 15.9  . Smokeless tobacco: Never Used  . Tobacco comment: QUIT IN 2002  Substance Use Topics  . Alcohol use: No    Alcohol/week: 0.0 oz   Objective:   BP 112/62 (BP Location: Left Arm, Patient Position: Sitting, Cuff Size: Normal)   Pulse 71   Temp 97.8 F (36.6 C) (Oral)   Wt 119 lb 12.8 oz (54.3 kg)   SpO2 98%   BMI 21.91 kg/m  Wt Readings from Last 3 Encounters:  07/28/17 119 lb 12.8 oz (54.3 kg)  07/19/17 122 lb (55.3 kg)  05/22/17 118 lb 3.2 oz (53.6 kg)    Physical Exam  Constitutional: She is oriented to person, place, and time. She appears well-developed and well-nourished. No distress.  HENT:  Head: Normocephalic and atraumatic.  Right Ear: Hearing normal.  Left Ear: Hearing normal.  Nose: Nose normal.  Eyes: Conjunctivae and lids are normal. Right eye exhibits no discharge. Left eye exhibits no discharge. No scleral icterus.  Neck: Neck supple.  Cardiovascular: Normal rate and regular rhythm.  Pulmonary/Chest: Effort normal and breath sounds normal. No  respiratory distress.  Abdominal: Soft. Bowel sounds are normal.  Musculoskeletal: Normal range of motion.  Neurological: She is alert and oriented to person, place, and time. She has normal reflexes.  Expressive aphasia since CVA. No muscle weakness. Fair balance.  Skin: Skin is intact. No lesion and no rash noted.  Psychiatric: Her speech is normal.  Some frustration with expressive aphasia and slight worsening.       Assessment & Plan:     1. Expressive aphasia Family has noticed some increase in expressive aphasia intermittently. Was very sleepy when she used an extra dose of the Klonopin to relieve her anxiety during a dental procedure. Recommend she decrease Klonopin to only one tablet at bedtime with Paxil. She does not feel there is a need  for any further speech therapy and has dismissed the therapist. Will be seeing Dr. Leonie Man (neurologist) next month. May be having some progression that will need full time assistance at home. Family continues to be very attentive and handling things well at the present time. Recheck routine follow up labs. Recheck pending reports. - CBC with Differential/Platelet - COMPLETE METABOLIC PANEL WITH GFR  2. Mixed hyperlipidemia Trying to lower fats in diet but not as active as in the past. Recheck Lipids and CMP. - Lipid panel - COMPLETE METABOLIC PANEL WITH GFR  3. Cerebrovascular accident (CVA) due to embolism of left middle cerebral artery (HCC) No unilateral weakness or dizziness. Aphasia seems to be a little worse at times. Will check labs for sign of metabolic disorder. Keep appoint to see her neurologist (Dr. Leonie Man) in January 2019. - CBC with Differential/Platelet - COMPLETE METABOLIC PANEL WITH GFR  4. Hemoglobin low Has been out of the multivitamin with iron. No extra weakness or fainting. Recheck CBC and ferritin level. - CBC with Differential/Platelet - Ferritin       Vernie Murders, PA  [facility]

## 2017-08-02 ENCOUNTER — Encounter: Payer: Self-pay | Admitting: Physician Assistant

## 2017-08-02 ENCOUNTER — Ambulatory Visit (INDEPENDENT_AMBULATORY_CARE_PROVIDER_SITE_OTHER): Payer: PPO | Admitting: Physician Assistant

## 2017-08-02 ENCOUNTER — Ambulatory Visit: Payer: PPO

## 2017-08-02 VITALS — BP 108/64 | HR 85 | Temp 97.5°F | Wt 117.8 lb

## 2017-08-02 DIAGNOSIS — D649 Anemia, unspecified: Secondary | ICD-10-CM | POA: Diagnosis not present

## 2017-08-02 DIAGNOSIS — R4701 Aphasia: Secondary | ICD-10-CM | POA: Diagnosis not present

## 2017-08-02 DIAGNOSIS — L509 Urticaria, unspecified: Secondary | ICD-10-CM

## 2017-08-02 DIAGNOSIS — I63412 Cerebral infarction due to embolism of left middle cerebral artery: Secondary | ICD-10-CM | POA: Diagnosis not present

## 2017-08-02 DIAGNOSIS — E782 Mixed hyperlipidemia: Secondary | ICD-10-CM | POA: Diagnosis not present

## 2017-08-02 LAB — COMPLETE METABOLIC PANEL WITH GFR
AG Ratio: 2.1 (calc) (ref 1.0–2.5)
ALT: 9 U/L (ref 6–29)
AST: 14 U/L (ref 10–35)
Albumin: 4.4 g/dL (ref 3.6–5.1)
Alkaline phosphatase (APISO): 83 U/L (ref 33–130)
BUN/Creatinine Ratio: 18 (calc) (ref 6–22)
BUN: 21 mg/dL (ref 7–25)
CO2: 29 mmol/L (ref 20–32)
Calcium: 9.8 mg/dL (ref 8.6–10.4)
Chloride: 105 mmol/L (ref 98–110)
Creat: 1.18 mg/dL — ABNORMAL HIGH (ref 0.60–0.93)
GFR, Est African American: 51 mL/min/{1.73_m2} — ABNORMAL LOW (ref >=60)
GFR, Est Non African American: 44 mL/min/{1.73_m2} — ABNORMAL LOW (ref >=60)
Globulin: 2.1 g/dL (calc) (ref 1.9–3.7)
Glucose, Bld: 94 mg/dL (ref 65–99)
Potassium: 4.6 mmol/L (ref 3.5–5.3)
Sodium: 143 mmol/L (ref 135–146)
Total Bilirubin: 0.4 mg/dL (ref 0.2–1.2)
Total Protein: 6.5 g/dL (ref 6.1–8.1)

## 2017-08-02 LAB — CBC WITH DIFFERENTIAL/PLATELET
BASOS PCT: 1 %
Basophils Absolute: 79 cells/uL (ref 0–200)
EOS ABS: 277 {cells}/uL (ref 15–500)
Eosinophils Relative: 3.5 %
HCT: 35.3 % (ref 35.0–45.0)
Hemoglobin: 11.8 g/dL (ref 11.7–15.5)
Lymphs Abs: 1999 cells/uL (ref 850–3900)
MCH: 31.1 pg (ref 27.0–33.0)
MCHC: 33.4 g/dL (ref 32.0–36.0)
MCV: 93.1 fL (ref 80.0–100.0)
MONOS PCT: 7.7 %
MPV: 10 fL (ref 7.5–12.5)
Neutro Abs: 4938 cells/uL (ref 1500–7800)
Neutrophils Relative %: 62.5 %
PLATELETS: 278 10*3/uL (ref 140–400)
RBC: 3.79 10*6/uL — ABNORMAL LOW (ref 3.80–5.10)
RDW: 13 % (ref 11.0–15.0)
TOTAL LYMPHOCYTE: 25.3 %
WBC mixed population: 608 cells/uL (ref 200–950)
WBC: 7.9 10*3/uL (ref 3.8–10.8)

## 2017-08-02 LAB — LIPID PANEL
Cholesterol: 260 mg/dL — ABNORMAL HIGH (ref <200)
HDL: 147 mg/dL (ref >50)
LDL Cholesterol (Calc): 92 mg/dL (calc)
Non-HDL Cholesterol (Calc): 113 mg/dL (calc) (ref <130)
TRIGLYCERIDES: 111 mg/dL (ref <150)
Total CHOL/HDL Ratio: 1.8 (calc) (ref <5.0)

## 2017-08-02 LAB — FERRITIN: Ferritin: 40 ng/mL (ref 20–288)

## 2017-08-02 MED ORDER — PREDNISONE 20 MG PO TABS: 20.0000 mg | ORAL_TABLET | Freq: Every day | ORAL | 0 refills | Status: DC

## 2017-08-02 NOTE — Patient Instructions (Signed)
Hives Hives (urticaria) are itchy, red, swollen areas on your skin. Hives can show up on any part of your body, and they can vary in size. They can be as small as the tip of a pen or much larger. Hives often fade within 24 hours (acute hives). In other cases, new hives show up after old ones fade. This can continue for many days or weeks (chronic hives). Hives are caused by your body's reaction to an irritant or to something that you are allergic to (trigger). You can get hives right after being around a trigger or hours later. Hives do not spread from person to person (are not contagious). Hives may get worse if you scratch them, if you exercise, or if you have worries (emotional stress). Follow these instructions at home: Medicines  Take or apply over-the-counter and prescription medicines only as told by your doctor.  If you were prescribed an antibiotic medicine, use it as told by your doctor. Do not stop taking the antibiotic even if you start to feel better. Skin Care  Apply cool, wet cloths (cool compresses) to the itchy, red, swollen areas.  Do not scratch your skin. Do not rub your skin. General instructions  Do not take hot showers or baths. This can make itching worse.  Do not wear tight clothes.  Use sunscreen and wear clothing that covers your skin when you are outside.  Avoid any triggers that cause your hives. Keep a journal to help you keep track of what causes your hives. Write down: ? What medicines you take. ? What you eat and drink. ? What products you use on your skin.  Keep all follow-up visits as told by your doctor. This is important. Contact a doctor if:  Your symptoms are not better with medicine.  Your joints are painful or swollen. Get help right away if:  You have a fever.  You have belly pain.  Your tongue or lips are swollen.  Your eyelids are swollen.  Your chest or throat feels tight.  You have trouble breathing or swallowing. These  symptoms may be an emergency. Do not wait to see if the symptoms will go away. Get medical help right away. Call your local emergency services (911 in the [place] Do not drive yourself to the hospital. This information is not intended to replace advice given to you by your health care provider. Make sure you discuss any questions you have with your health care provider. Document Released: 05/03/2008 Document Revised: 12/31/2015 Document Reviewed: 05/13/2015 Elsevier Interactive Patient Education  2018 Else[employer] Inc.  

## 2017-08-02 NOTE — Progress Notes (Signed)
Patient: Stephanie [name] Female    DOB: 04-06-1936   81 y.o.   MRN: [id number] Visit Date: 08/02/2017  Today's Provider: Mar Daring, PA-C   Chief Complaint  Patient presents with  . Facial Swelling   Subjective:    HPI Patient presents today with complaints of left eye swelling and redness. She reports symptoms started this morning. Patient was diagnosed with conjunctivitis of both eyes on 07/19/17 and prescribed Cipro 0.3% optic solution.Patient reports good compliance with treatment plan. She states she has some drops remaining.  She has a history of hives. She was seen on 07/28/17 by Simona Huh for hives on legs, arms and face.   She reports that this morning she awoke with her left eye swollen shut. She applied warm compresses and a small amount of hydrocortisone cream to the eyelid and this helped resolve some swelling. She denies any itching, foreign body sensation, eye redness or irritation. Does report some blurred vision but this has been chronic since her stroke in January 2018. She has been having hives on her arms and face. She also does have an erythematous patch on her forehead today as well that is more itchy then her eye she reports.     Allergies  Allergen Reactions  . Duloxetine Hcl     unknown  . Influenza Vaccines     unknown  . Venlafaxine     GI distress     Current Outpatient Medications:  .  acetaminophen (TYLENOL) 500 MG tablet, Take 1 tablet by mouth 2 (two) times daily as needed. , Disp: , Rfl:  .  albuterol (VENTOLIN HFA) 108 (90 Base) MCG/ACT inhaler, Inhale 2 puffs into the lungs every 6 (six) hours as needed., Disp: 3 Inhaler, Rfl: 3 .  Calcium Carb-Cholecalciferol (CALCIUM 600+D) 600-800 MG-UNIT TABS, Take 2 tablets by mouth daily., Disp: , Rfl:  .  clonazePAM (KLONOPIN) 0.5 MG tablet, TAKE 1 TABLET BY MOUTH TWICE DAILY AS NEEDED FOR ANXIETY, Disp: 180 tablet, Rfl: 5 .  docusate sodium (COLACE) 100 MG capsule, Take 200 mg by mouth at  bedtime., Disp: , Rfl:  .  ELIQUIS 5 MG TABS tablet, TAKE ONE (1) TABLET BY MOUTH TWO (2) TIMES DAILY, Disp: 180 tablet, Rfl: 3 .  gabapentin (NEURONTIN) 100 MG capsule, TAKE 1 CAPSULE BY MOUTH THREE TIMES A DAY, Disp: 90 capsule, Rfl: 3 .  Multiple Vitamins-Iron (MULTIVITAMIN/IRON PO), Take by mouth., Disp: , Rfl:  .  PARoxetine (PAXIL) 10 MG tablet, Take 2 tablets (20 mg total) by mouth at bedtime. Take 1 tablet in the morning and 2 tablets at bedtime (Patient taking differently: Take 20 mg by mouth. Take 1 tablet in the morning and 2 tablets at bedtime), Disp: 90 tablet, Rfl: 3 .  Spacer/Aero Chamber Mouthpiece MISC, Use spacer on inhalers for better administration., Disp: 1 each, Rfl: 1 .  SPIRIVA HANDIHALER 18 MCG inhalation capsule, INHALE CONTENTS OF ONE CAPSULE ONCE A DAY AS DIRECTED, Disp: 90 capsule, Rfl: 4 .  SYMBICORT 160-4.5 MCG/ACT inhaler, INHALE TWO PUFFS INTO THE LUNGS TWICE A DAY, Disp: 30.6 g, Rfl: 3  Current Facility-Administered Medications:  .  ipratropium-albuterol (DUONEB) 0.5-2.5 (3) MG/3ML nebulizer solution 3 mL, 3 mL, Nebulization, Q4H PRN, Hower, Aaron Mose, MD  Review of Systems  Constitutional: Negative.   Eyes: Positive for redness.       Eye swelling  Respiratory: Negative.   Cardiovascular: Negative.     Social History   Tobacco Use  .  Smoking status: Former Smoker    Last attempt to quit: 08/07/2001    Years since quitting: 15.9  . Smokeless tobacco: Never Used  . Tobacco comment: QUIT IN 2002  Substance Use Topics  . Alcohol use: No    Alcohol/week: 0.0 oz   Objective:   BP 108/64 (BP Location: Left Arm, Patient Position: Sitting, Cuff Size: Normal)   Pulse 85   Temp (!) 97.5 F (36.4 C) (Oral)   Wt 117 lb 12.8 oz (53.4 kg)   SpO2 95%   BMI 21.55 kg/m    Physical Exam  Constitutional: She appears well-developed and well-nourished. No distress.  Eyes: Conjunctivae and EOM are normal. Pupils are equal, round, and reactive to light. Right eye  exhibits no chemosis, no discharge, no exudate and no hordeolum. No foreign body present in the right eye. Left eye exhibits no chemosis, no discharge, no exudate and no hordeolum. No foreign body present in the left eye. No scleral icterus.  Swelling of the upper and lower eyelid with redness noted as well  Neck: Normal range of motion. Neck supple.  Cardiovascular: Normal rate, regular rhythm and normal heart sounds. Exam reveals no gallop and no friction rub.  No murmur heard. Pulmonary/Chest: Effort normal and breath sounds normal. No respiratory distress. She has no wheezes. She has no rales.  Lymphadenopathy:    She has no cervical adenopathy.  Skin: She is not diaphoretic.  Vitals reviewed.       Assessment & Plan:     1. Hives Suspect hives as source. Will give prednisone as below. Continue Zyrtec daily. Continue topical hydrocortisone cream prn. Warm compresses for eye. Call if symptoms worsen.  - predniSONE (DELTASONE) 20 MG tablet; Take 1 tablet (20 mg total) by mouth daily with breakfast.  Dispense: 10 tablet; Refill: 0       Mar Daring, PA-C  [facility]

## 2017-08-03 ENCOUNTER — Telehealth: Payer: Self-pay | Admitting: Family Medicine

## 2017-08-03 NOTE — Telephone Encounter (Signed)
Patient's daughter Butch Penny is requesting a call back.  She states that she missed a couple of calls and she is assuming it is for lab results.

## 2017-08-03 NOTE — Telephone Encounter (Signed)
-----   Message from Margo Common, [place] sent at 08/03/2017 10:19 AM EST ----- Cholesterol and blood counts essentially normal. Creatinine/kidney function slowly improving. Proceed with follow up with Dr. Leonie Man as planned and have this lab report available for him to see (may pick up copy here if desired).

## 2017-08-03 NOTE — Telephone Encounter (Signed)
Patient's daughter Butch Penny advised. Lab results faxed to Dr. Leonie Man at [facility] (fax) # [phone]. Patient has a follow up scheduled for 09/06/17

## 2017-08-14 ENCOUNTER — Ambulatory Visit (INDEPENDENT_AMBULATORY_CARE_PROVIDER_SITE_OTHER): Payer: PPO | Admitting: *Deleted

## 2017-08-14 DIAGNOSIS — I639 Cerebral infarction, unspecified: Secondary | ICD-10-CM

## 2017-08-15 NOTE — Progress Notes (Signed)
Carelink Summary Report / Loop Recorder 

## 2017-08-23 ENCOUNTER — Ambulatory Visit (INDEPENDENT_AMBULATORY_CARE_PROVIDER_SITE_OTHER): Payer: PPO | Admitting: Physician Assistant

## 2017-08-23 ENCOUNTER — Encounter: Payer: Self-pay | Admitting: Physician Assistant

## 2017-08-23 VITALS — BP 128/64 | HR 76 | Temp 98.5°F | Resp 16 | Wt 121.0 lb

## 2017-08-23 DIAGNOSIS — L509 Urticaria, unspecified: Secondary | ICD-10-CM | POA: Diagnosis not present

## 2017-08-23 MED ORDER — RANITIDINE HCL 75 MG PO TABS: 75.0000 mg | ORAL_TABLET | Freq: Two times a day (BID) | ORAL | 2 refills | Status: DC

## 2017-08-23 NOTE — Patient Instructions (Signed)
Hives Hives (urticaria) are itchy, red, swollen areas on your skin. Hives can show up on any part of your body, and they can vary in size. They can be as small as the tip of a pen or much larger. Hives often fade within 24 hours (acute hives). In other cases, new hives show up after old ones fade. This can continue for many days or weeks (chronic hives). Hives are caused by your body's reaction to an irritant or to something that you are allergic to (trigger). You can get hives right after being around a trigger or hours later. Hives do not spread from person to person (are not contagious). Hives may get worse if you scratch them, if you exercise, or if you have worries (emotional stress). Follow these instructions at home: Medicines  Take or apply over-the-counter and prescription medicines only as told by your doctor.  If you were prescribed an antibiotic medicine, use it as told by your doctor. Do not stop taking the antibiotic even if you start to feel better. Skin Care  Apply cool, wet cloths (cool compresses) to the itchy, red, swollen areas.  Do not scratch your skin. Do not rub your skin. General instructions  Do not take hot showers or baths. This can make itching worse.  Do not wear tight clothes.  Use sunscreen and wear clothing that covers your skin when you are outside.  Avoid any triggers that cause your hives. Keep a journal to help you keep track of what causes your hives. Write down: ? What medicines you take. ? What you eat and drink. ? What products you use on your skin.  Keep all follow-up visits as told by your doctor. This is important. Contact a doctor if:  Your symptoms are not better with medicine.  Your joints are painful or swollen. Get help right away if:  You have a fever.  You have belly pain.  Your tongue or lips are swollen.  Your eyelids are swollen.  Your chest or throat feels tight.  You have trouble breathing or swallowing. These  symptoms may be an emergency. Do not wait to see if the symptoms will go away. Get medical help right away. Call your local emergency services (911 in the [place] Do not drive yourself to the hospital. This information is not intended to replace advice given to you by your health care provider. Make sure you discuss any questions you have with your health care provider. Document Released: 05/03/2008 Document Revised: 12/31/2015 Document Reviewed: 05/13/2015 Elsevier Interactive Patient Education  2018 Else[employer] Inc.  

## 2017-08-23 NOTE — Progress Notes (Signed)
Patient: Stephanie [name] Female    DOB: 10-10-1935   82 y.o.   MRN: [id number] Visit Date: 08/23/2017  Today's Provider: Trinna Post, PA-C   Chief Complaint  Patient presents with  . Eye Problem    Left eye swelling started about two days ago.  . Rash    On going issue   Subjective:    Stephanie [name] is a 82 y/o woman with a history of stroke with expressive aphasia, history of COPD who is presenting today for hives and a cough. She presents here with her daughter who contributes most of the history.  Daughter reports she has a history of hives that she has been seen for in this office previously. She has been treated for this previously with hydrocortisone cream and prednisone. Most recently, she has some hives on her right arm and above her right brow. The daughter reports they feel her hives are related to stress and anxiety. Has not seen allergist. Currently weaning off klonopin.   She has COPD and is having a cough and is worried about COPD exacerbation. She is not having SOB, productive cough.   Eye Problem   The left eye is affected. This is a new problem. The current episode started in the past 7 days. The problem occurs constantly. The problem has been gradually improving. Associated symptoms include an eye discharge, eye redness and photophobia. Pertinent negatives include no fever, itching or vomiting.  Rash  This is a recurrent problem. The problem is unchanged. Associated symptoms include congestion and coughing. Pertinent negatives include no diarrhea, eye pain, fatigue, fever, joint pain, nail changes, rhinorrhea, shortness of breath, sore throat or vomiting.  Breathing Problem  She complains of cough and difficulty breathing. There is no shortness of breath or wheezing. This is a recurrent problem. The current episode started in the past 7 days. The cough is non-productive. Associated symptoms include PND. Pertinent negatives include no fever,  malaise/fatigue, postnasal drip, rhinorrhea, sneezing or sore throat.       Allergies  Allergen Reactions  . Duloxetine Hcl     unknown  . Influenza Vaccines     unknown  . Venlafaxine     GI distress     Current Outpatient Medications:  .  acetaminophen (TYLENOL) 500 MG tablet, Take 1 tablet by mouth 2 (two) times daily as needed. , Disp: , Rfl:  .  albuterol (VENTOLIN HFA) 108 (90 Base) MCG/ACT inhaler, Inhale 2 puffs into the lungs every 6 (six) hours as needed., Disp: 3 Inhaler, Rfl: 3 .  Calcium Carb-Cholecalciferol (CALCIUM 600+D) 600-800 MG-UNIT TABS, Take 2 tablets by mouth daily., Disp: , Rfl:  .  clonazePAM (KLONOPIN) 0.5 MG tablet, TAKE 1 TABLET BY MOUTH TWICE DAILY AS NEEDED FOR ANXIETY, Disp: 180 tablet, Rfl: 5 .  docusate sodium (COLACE) 100 MG capsule, Take 200 mg by mouth at bedtime., Disp: , Rfl:  .  ELIQUIS 5 MG TABS tablet, TAKE ONE (1) TABLET BY MOUTH TWO (2) TIMES DAILY, Disp: 180 tablet, Rfl: 3 .  gabapentin (NEURONTIN) 100 MG capsule, TAKE 1 CAPSULE BY MOUTH THREE TIMES A DAY, Disp: 90 capsule, Rfl: 3 .  Multiple Vitamins-Iron (MULTIVITAMIN/IRON PO), Take by mouth., Disp: , Rfl:  .  PARoxetine (PAXIL) 10 MG tablet, Take 2 tablets (20 mg total) by mouth at bedtime. Take 1 tablet in the morning and 2 tablets at bedtime (Patient taking differently: Take 20 mg by mouth. Take 1 tablet in  the morning and 2 tablets at bedtime), Disp: 90 tablet, Rfl: 3 .  SPIRIVA HANDIHALER 18 MCG inhalation capsule, INHALE CONTENTS OF ONE CAPSULE ONCE A DAY AS DIRECTED, Disp: 90 capsule, Rfl: 4 .  SYMBICORT 160-4.5 MCG/ACT inhaler, INHALE TWO PUFFS INTO THE LUNGS TWICE A DAY, Disp: 30.6 g, Rfl: 3 .  predniSONE (DELTASONE) 20 MG tablet, Take 1 tablet (20 mg total) by mouth daily with breakfast. (Patient not taking: Reported on 08/23/2017), Disp: 10 tablet, Rfl: 0 .  Spacer/Aero Chamber Mouthpiece MISC, Use spacer on inhalers for better administration., Disp: 1 each, Rfl: 1  Current  Facility-Administered Medications:  .  ipratropium-albuterol (DUONEB) 0.5-2.5 (3) MG/3ML nebulizer solution 3 mL, 3 mL, Nebulization, Q4H PRN, Hower, Aaron Mose, MD  Review of Systems  Constitutional: Negative.  Negative for fatigue, fever and malaise/fatigue.  HENT: Positive for congestion. Negative for postnasal drip, rhinorrhea, sneezing and sore throat.   Eyes: Positive for photophobia, discharge and redness. Negative for pain, itching and visual disturbance.  Respiratory: Positive for cough and chest tightness. Negative for apnea, choking, shortness of breath, wheezing and stridor.   Cardiovascular: Positive for PND.  Gastrointestinal: Negative for diarrhea and vomiting.  Musculoskeletal: Negative for joint pain.  Skin: Positive for rash. Negative for color change, nail changes, pallor and wound.    Social History   Tobacco Use  . Smoking status: Former Smoker    Last attempt to quit: 08/07/2001    Years since quitting: 16.0  . Smokeless tobacco: Never Used  . Tobacco comment: QUIT IN 2002  Substance Use Topics  . Alcohol use: No    Alcohol/week: 0.0 oz   Objective:   BP 128/64 (BP Location: Left Arm, Patient Position: Sitting, Cuff Size: Normal)   Pulse 76   Temp 98.5 F (36.9 C) (Oral)   Resp 16   Wt 121 lb (54.9 kg)   BMI 22.13 kg/m  Vitals:   08/23/17 1111  BP: 128/64  Pulse: 76  Resp: 16  Temp: 98.5 F (36.9 C)  TempSrc: Oral  Weight: 121 lb (54.9 kg)     Physical Exam  Constitutional: She is oriented to person, place, and time. She appears well-developed and well-nourished.  Cardiovascular: Normal rate and regular rhythm.  Pulmonary/Chest: Effort normal and breath sounds normal. She has no wheezes. She has no rales.  Neurological: She is alert and oriented to person, place, and time.  Skin: Skin is warm and dry. Rash noted.  Hives evident on right forearm and right brow.   Psychiatric: She has a normal mood and affect. Her behavior is normal.          Assessment & Plan:     1. Hives  This seems to be an ongoing problem. Possibly exacerbated by anxiety, she is currently weaning off klonopin. She has not yet been to allergist, offered referral if they would like. She may also try zantac as non-sedating antihistamine to see if this helps vs. Repeated treatments with benadryl which may be overly sedating for her and prednisone, which poses multiple issues with long term use.  - ranitidine (ZANTAC 75) 75 MG tablet; Take 1 tablet (75 mg total) by mouth 2 (two) times daily.  Dispense: 60 tablet; Refill: 2  Return if symptoms worsen or fail to improve.  The entirety of the information documented in the History of Present Illness, Review of Systems and Physical Exam were personally obtained by me. Portions of this information were initially documented by Ashley Royalty, CMA and reviewed  by me for thoroughness and accuracy.         Trinna Post, PA-C  [facility]

## 2017-08-25 LAB — CUP PACEART REMOTE DEVICE CHECK
MDC IDC PG IMPLANT DT: 20180112
MDC IDC SESS DTM: 20190108031643

## 2017-09-04 ENCOUNTER — Telehealth: Payer: Self-pay | Admitting: Family Medicine

## 2017-09-06 ENCOUNTER — Ambulatory Visit: Payer: PPO | Admitting: Nurse Practitioner

## 2017-09-06 ENCOUNTER — Encounter: Payer: Self-pay | Admitting: Nurse Practitioner

## 2017-09-06 VITALS — BP 139/78 | HR 87 | Wt 121.4 lb

## 2017-09-06 DIAGNOSIS — Z8673 Personal history of transient ischemic attack (TIA), and cerebral infarction without residual deficits: Secondary | ICD-10-CM

## 2017-09-06 DIAGNOSIS — R4701 Aphasia: Secondary | ICD-10-CM

## 2017-09-06 DIAGNOSIS — I10 Essential (primary) hypertension: Secondary | ICD-10-CM | POA: Diagnosis not present

## 2017-09-06 NOTE — Patient Instructions (Addendum)
Stressed the importance of management of risk factors to prevent further stroke Continue eliquis for secondary stroke prevention Maintain strict control of hypertension with blood pressure goal below 130/90, today's reading 139/78 continue antihypertensive medications Control of diabetes with hemoglobin A1c below 6.5 followed by primary care  Recent Cholesterol followed by primary care,  Essentially normal Exercise by walking eat healthy diet with whole grains,  fresh fruits and vegetables Continue to practice HEP for speech Lower extremity venous  ultrasound for DVT 8/27 18 unchanged. Discharge from stroke clinic  Stroke Prevention Some health problems and behaviors may make it more likely for you to have a stroke. Below are ways to lessen your risk of having a stroke.  Be active for at least 30 minutes on most or all days.  Do not smoke. Try not to be around others who smoke.  Do not drink too much alcohol. ? Do not have more than 2 drinks a day if you are a man. ? Do not have more than 1 drink a day if you are a woman and are not pregnant.  Eat healthy foods, such as fruits and vegetables. If you were put on a specific diet, follow the diet as told.  Keep your cholesterol levels under control through diet and medicines. Look for foods that are low in saturated fat, trans fat, cholesterol, and are high in fiber.  If you have diabetes, follow all diet plans and take your medicine as told.  Ask your doctor if you need treatment to lower your blood pressure. If you have high blood pressure (hypertension), follow all diet plans and take your medicine as told by your doctor.  If you are 19-42 years old, have your blood pressure checked every 3-5 years. If you are age 42 or older, have your blood pressure checked every year.  Keep a healthy weight. Eat foods that are low in calories, salt, saturated fat, trans fat, and cholesterol.  Do not take drugs.  Avoid birth control pills, if  this applies. Talk to your doctor about the risks of taking birth control pills.  Talk to your doctor if you have sleep problems (sleep apnea).  Take all medicine as told by your doctor. ? You may be told to take aspirin or blood thinner medicine. Take this medicine as told by your doctor. ? Understand your medicine instructions.  Make sure any other conditions you have are being taken care of.  Get help right away if:  You suddenly lose feeling (you feel numb) or have weakness in your face, arm, or leg.  Your face or eyelid hangs down to one side.  You suddenly feel confused.  You have trouble talking (aphasia) or understanding what people are saying.  You suddenly have trouble seeing in one or both eyes.  You suddenly have trouble walking.  You are dizzy.  You lose your balance or your movements are clumsy (uncoordinated).  You suddenly have a very bad headache and you do not know the cause.  You have new chest pain.  Your heart feels like it is fluttering or skipping a beat (irregular heartbeat). Do not wait to see if the symptoms above go away. Get help right away. Call your local emergency services (911 in [place] Do not drive yourself to the hospital. This information is not intended to replace advice given to you by your health care provider. Make sure you discuss any questions you have with your health care provider. Document Released: 01/24/2012 Document Revised: 12/31/2015  Document Reviewed: 01/25/2013 Elsevier Interactive Patient Education  [employer].

## 2017-09-06 NOTE — Progress Notes (Signed)
[facility]  PATIENT: Stephanie [name] DOB: [birth year]   REASON FOR VISIT: Follow-up for cryptogenic stroke HISTORY FROM:daughters Stephanie [name] and Stephanie [name]  and patient    HISTORY OF PRESENT ILLNESS:UPDATE 1/30/2019CM Stephanie [name], 82 year old female returns for follow-up with her daughters.  She has a history of stroke in January 2018.  She continues to have some expressive aphasia but it is better.  Her speech therapy has concluded.  She does not practice her home exercise program.  She is currently on Eliquis for secondary stroke prevention and deep vein thrombosis.  She has not had further stroke or TIA symptoms.  She has minimal bruising and no bleeding.  Primary care has cut back on her Klonopin and her confusion is better.  CMP lipid panel ferritin and CBC done 08/02/2017 within normal limits except creatinine remains elevated at 1.18.  Blood pressure in the office today 139/78.  Vascular ultrasound of lower extremities August 27th 2018 No evidence of acute deep vein thrombosis involving the left lower extremity and right common femoral vein. Chronic deep vein thrombosis involving the left popliteal vein. No evidence of Baker&'s cyst on the left.  Appetite is good.  She denies any falls.  She continues to live alone and has [employer].  Daughters check on her daily and provide her meals.  She returns for reevaluation  UPDATE 07/30/2018CM Stephanie [name], 82 year old female returns for follow-up with history of hospital admission for stroke in January 2018. She continues to have some expressive aphasia. She continues to get speech therapy 1 time a week. She is currently on eliquis for secondary stroke prevention and deep vein thrombosis. She has not had further stroke or TIA symptoms. She has minimal bruising and no bleeding. Blood pressure in the office today 134/69. Lipids are followed by primary care. When last seen she was having some transient confusion of unclear  etiology .  UA  normal and CT scan of the head showing no new or worrisome findings and expected evolutionary changes. CBC and CMP normal Lower extremity venous Dopplers shows persistent chronic clot in the left popliteal vein hence continuing eliquis and repeat lower extremity venous Doppler in September.She returns for reevaluation   HISTORY 12/15/16 Stephanie [name] is a 64 year old pleasant Caucasian lady who is seen today for first office follow-up visit following hospital admission for stroke in January 2018. She is accompanied by 2 of her daughters provide most of the history which is also obtained from [facility] records and have personally reviewed imaging films.Stephanie [name] an 89 y.o.femalewith hx as below was last seen normal 08/16/16 night and presented when daughter found her with expressive aphasia and L sided weakness. CTH showed acute infract in the L temp and parietal cortex region. Patient was not administered IV TPA secondary to delay in arrival. Patient was found to have right-sided visual field cut and expressive aphasia. MRA of the brain showed multifocal moderate stenosis and bilateral middle cerebral artery and M2 segments. Carotid ultrasound showed no significant extracranial stenosis. Transthoracic echo showed normal ejection fraction. Transesophageal echocardiogram was attempted but could not be performed due to difficulty in passing the probe. Transcanal Doppler bubble study showed high intensity transient signals suggestive of large patent foramen ovale. Lower extremity venous Dopplers showed subacute deep vein thrombosis involving the proximal popliteal vein in the left leg as well as superficial thrombosis in the left greater than right saphenous vein from the mid to proximal calf.h. Patient was started on eliquis. Hemoglobin A1c was 5.7 and LDL  cholesterol was 77 mg percent. She was started on Lipitor 40 mg. She was transferred for rehabilitation. She is currently  at home and getting speech therapy. Speech is improving but she is still struggling nonfluent speech. She is living by herself the daughters check on her frequently and she spends the night alone. She was doing better until a week ago when the daughters were concerned that she was found to be more confused as well as agitated. They called for an urgent appointment to see me but feels it may have settled down in the last day or 2 she is doing better. She has not had any testing or evaluation done for to evaluate this episode of confusion. The patient has a loop recorder implanted and so for paroxysmal atrial fibrillation has not yet been detected   REVIEW OF SYSTEMS: Full 14 system review of systems performed and notable only for those listed, all others are neg:  Constitutional: neg  Cardiovascular: neg Ear/Nose/Throat: neg  Skin: neg Eyes: neg Respiratory: neg Gastroitestinal: neg  Hematology/Lymphatic: neg  Endocrine: neg Musculoskeletal:neg Allergy/Immunology: neg Neurological: Speech difficulty Psychiatric: neg Sleep : neg   ALLERGIES: Allergies  Allergen Reactions  . Duloxetine Hcl     unknown  . Influenza Vaccines     unknown  . Venlafaxine     GI distress    HOME MEDICATIONS: Outpatient Medications Prior to Visit  Medication Sig Dispense Refill  . acetaminophen (TYLENOL) 500 MG tablet Take 1 tablet by mouth 2 (two) times daily as needed.     Marland Kitchen albuterol (VENTOLIN HFA) 108 (90 Base) MCG/ACT inhaler Inhale 2 puffs into the lungs every 6 (six) hours as needed. 3 Inhaler 3  . amLODipine (NORVASC) 5 MG tablet Take 5 mg by mouth daily.    . Calcium Carb-Cholecalciferol (CALCIUM 600+D) 600-800 MG-UNIT TABS Take 2 tablets by mouth daily.    . cetirizine (ZYRTEC) 10 MG tablet Take 10 mg by mouth daily.    . clonazePAM (KLONOPIN) 0.5 MG tablet TAKE 1 TABLET BY MOUTH TWICE DAILY AS NEEDED FOR ANXIETY 180 tablet 5  . docusate sodium (COLACE) 100 MG capsule Take 200 mg by mouth at  bedtime.    Marland Kitchen ELIQUIS 5 MG TABS tablet TAKE ONE (1) TABLET BY MOUTH TWO (2) TIMES DAILY 180 tablet 3  . gabapentin (NEURONTIN) 100 MG capsule TAKE 1 CAPSULE BY MOUTH THREE TIMES A DAY 90 capsule 3  . Multiple Vitamins-Iron (MULTIVITAMIN/IRON PO) Take by mouth.    Marland Kitchen PARoxetine (PAXIL) 10 MG tablet Take 2 tablets (20 mg total) by mouth at bedtime. Take 1 tablet in the morning and 2 tablets at bedtime (Patient taking differently: Take 20 mg by mouth. Take 1 tablet in the morning and 2 tablets at bedtime) 90 tablet 3  . ranitidine (ZANTAC 75) 75 MG tablet Take 1 tablet (75 mg total) by mouth 2 (two) times daily. 60 tablet 2  . Spacer/Aero Chamber Mouthpiece MISC Use spacer on inhalers for better administration. 1 each 1  . SPIRIVA HANDIHALER 18 MCG inhalation capsule INHALE CONTENTS OF ONE CAPSULE ONCE A DAY AS DIRECTED 90 capsule 4  . SYMBICORT 160-4.5 MCG/ACT inhaler INHALE TWO PUFFS INTO THE LUNGS TWICE A DAY 30.6 g 3  . predniSONE (DELTASONE) 20 MG tablet Take 1 tablet (20 mg total) by mouth daily with breakfast. (Patient not taking: Reported on 08/23/2017) 10 tablet 0   Facility-Administered Medications Prior to Visit  Medication Dose Route Frequency Provider Last Rate Last Dose  .  ipratropium-albuterol (DUONEB) 0.5-2.5 (3) MG/3ML nebulizer solution 3 mL  3 mL Nebulization Q4H PRN Hower, Aaron Mose, MD        PAST MEDICAL HISTORY: Past Medical History:  Diagnosis Date  . Arthritis   . Complication of anesthesia   . COPD (chronic obstructive pulmonary disease) ([place])   . Coronary artery disease 2013   stent  . Depression   . Edema   . Pneumonia    IN PAST  . PONV (postoperative nausea and vomiting)   . Stroke [facility])     PAST SURGICAL HISTORY: Past Surgical History:  Procedure Laterality Date  . BALLOON ANGIOPLASTY, ARTERY  05/17/2009  . CATARACT EXTRACTION W/PHACO Left 05/24/2016   Procedure: CATARACT EXTRACTION PHACO AND INTRAOCULAR LENS PLACEMENT (IOC);  Surgeon: Birder Robson,  MD;  Location: [facility] ORS;  Service: Ophthalmology;  Laterality: Left;  Lot# [id number] H [place]:   00:52.3 AP%:   27.3 CDE:  14.26   . CATARACT EXTRACTION W/PHACO Right 06/14/2016   Procedure: CATARACT EXTRACTION PHACO AND INTRAOCULAR LENS PLACEMENT (IOC);  Surgeon: Birder Robson, MD;  Location: [facility] ORS;  Service: Ophthalmology;  Laterality: Right;  Lot# [id number] H [place]: 01:13.4 AP%: 24.4 CDE: 17.85  . CORONARY ANGIOPLASTY     STENT  . EP IMPLANTABLE DEVICE N/A 08/19/2016   Procedure: Loop Recorder Insertion;  Surgeon: Will Meredith Leeds, MD;  Location: [facility];  Service: Cardiovascular;  Laterality: N/A;    FAMILY HISTORY: Family History  Problem Relation Age of Onset  . Hypertension Mother   . Diabetes Mother   . Pancreatic cancer Mother 52  . Cancer Mother   . Suicidality Father 35  . Hypertension Father   . Alcohol abuse Father   . Hypertension Sister   . Heart disease Sister   . Kidney disease Sister   . Varicose Veins Sister   . Parkinson's disease Sister     SOCIAL HISTORY: Social History   Socioeconomic History  . Marital status: Widowed    Spouse name: Not on file  . Number of children: 3  . Years of education: Not on file  . Highest education level: Not on file  Social Needs  . Financial resource strain: Not on file  . Food insecurity - worry: Not on file  . Food insecurity - inability: Not on file  . Transportation needs - medical: Not on file  . Transportation needs - non-medical: Not on file  Occupational History  . Occupation: retired  Tobacco Use  . Smoking status: Former Smoker    Last attempt to quit: 08/07/2001    Years since quitting: 16.0  . Smokeless tobacco: Never Used  . Tobacco comment: QUIT IN 2002  Substance and Sexual Activity  . Alcohol use: No    Alcohol/week: 0.0 oz  . Drug use: No  . Sexual activity: Not on file  Other Topics Concern  . Not on file  Social History Narrative   Lives alone     PHYSICAL EXAM  Vitals:     09/06/17 1431  BP: 139/78  Pulse: 87  Weight: 121 lb 6.4 oz (55.1 kg)   Body mass index is 22.2 kg/m.  Generalized: Well developed, in no acute distress  Head: normocephalic and atraumatic,. Oropharynx benign  Neck: Supple, no carotid bruits  Cardiac: Regular rate rhythm, no murmur  Musculoskeletal: No deformity   Neurological examination   Mentation: Alert oriented to time, place, , Mild expressive and mild receptive aphasia.  Follows 3 step commands. Marland Kitchen  Cranial nerve II-XII: Pupils were equal round reactive to light extraocular movements were full, visual field were full on confrontational test. Facial sensation and strength were normal. hearing was intact to finger rubbing bilaterally. Uvula tongue midline. head turning and shoulder shrug were normal and symmetric.Tongue protrusion into cheek strength was normal. Motor: normal bulk and tone, full strength in the BUE, BLE, fine finger movements normal, no pronator drift. No focal weakness Sensory: normal and symmetric to light touch, pinprick, and  Vibration, in the upper and lower extremities Coordination: finger-nose-finger, heel-to-shin bilaterally, no dysmetria Reflexes: 1+ upper lower and symmetric., plantar responses were flexor bilaterally. Gait and Station: Rising up from seated position without assistance, normal stance,  moderate stride, good arm swing, smooth turning, able to perform tiptoe, and heel walking without difficulty. Tandem gait is steady.  No assistive device  DIAGNOSTIC DATA (LABS, IMAGING, TESTING) - I reviewed patient records, labs, notes, testing and imaging myself where available.  Lab Results  Component Value Date   WBC 7.9 08/02/2017   HGB 11.8 08/02/2017   HCT 35.3 08/02/2017   MCV 93.1 08/02/2017   PLT 278 08/02/2017      Component Value Date/Time   NA 143 08/02/2017 0826   NA 142 12/15/2016 1651   K 4.6 08/02/2017 0826   CL 105 08/02/2017 0826   CO2 29 08/02/2017 0826   GLUCOSE 94  08/02/2017 0826   BUN 21 08/02/2017 0826   BUN 20 12/15/2016 1651   CREATININE 1.18 (H) 08/02/2017 0826   CALCIUM 9.8 08/02/2017 0826   PROT 6.5 08/02/2017 0826   PROT 6.6 12/15/2016 1651   ALBUMIN 4.5 12/15/2016 1651   AST 14 08/02/2017 0826   ALT 9 08/02/2017 0826   ALKPHOS 82 12/15/2016 1651   BILITOT 0.4 08/02/2017 0826   BILITOT <0.2 12/15/2016 1651   GFRNONAA 44 (L) 08/02/2017 0826   GFRAA 51 (L) 08/02/2017 0826   Lab Results  Component Value Date   CHOL 260 (H) 08/02/2017   HDL 147 08/02/2017   LDLCALC 99 08/18/2016   TRIG 111 08/02/2017   CHOLHDL 1.8 08/02/2017   Lab Results  Component Value Date   HGBA1C 5.7 (H) 08/21/2016    Lab Results  Component Value Date   TSH 1.84 04/27/2017      ASSESSMENT AND PLAN 66 year Caucasian lady with left middle cerebral artery branch infarct in January 7124 of embolic etiology likely from paradoxical embolism from patent foramen ovale in the presence of acute deep vein thrombosis in the leg. Vascular risk factors of hypertension and hyperlipidemia and patent foramen ovale she has mild  residual aphasia.    PLAN: Stressed the importance of management of risk factors to prevent further stroke Continue eliquis for secondary stroke prevention Maintain strict control of hypertension with blood pressure goal below 130/90, today's reading 139/78 continue antihypertensive medications Control of diabetes with hemoglobin A1c below 6.5 followed by primary care  Recent Cholesterol followed by primary care,  Essentially normal Exercise by walking eat healthy diet with whole grains,  fresh fruits and vegetables Continue to practice HEP for speech Lower extremity venous  ultrasound for DVT 8/27 18 unchanged. Discharge from stroke clinic I spent 25 minutes in total face to face time with the patient more than 50% of which was spent counseling and coordination of care, reviewing test results reviewing medications and discussing and reviewing  the diagnosis of stroke and management of risk factors.  Patient was given written information for later reference and this  was reviewed with the daughters Stephanie [name], [facility], [facility], [facility] [address] [place], [place] [address] [phone]

## 2017-09-06 NOTE — Progress Notes (Signed)
I agree with the above plan 

## 2017-09-12 ENCOUNTER — Encounter: Payer: Self-pay | Admitting: Emergency Medicine

## 2017-09-12 ENCOUNTER — Telehealth: Payer: Self-pay

## 2017-09-12 ENCOUNTER — Other Ambulatory Visit: Payer: Self-pay

## 2017-09-12 ENCOUNTER — Emergency Department: Payer: PPO

## 2017-09-12 ENCOUNTER — Inpatient Hospital Stay
Admission: EM | Admit: 2017-09-12 | Discharge: 2017-09-15 | DRG: 871 | Disposition: A | Discharge status: Home Health | Payer: PPO | Attending: Internal Medicine | Admitting: Internal Medicine

## 2017-09-12 DIAGNOSIS — F0390 Unspecified dementia without behavioral disturbance: Secondary | ICD-10-CM | POA: Diagnosis not present

## 2017-09-12 DIAGNOSIS — Z888 Allergy status to other drugs, medicaments and biological substances status: Secondary | ICD-10-CM

## 2017-09-12 DIAGNOSIS — I129 Hypertensive chronic kidney disease with stage 1 through stage 4 chronic kidney disease, or unspecified chronic kidney disease: Secondary | ICD-10-CM | POA: Diagnosis present

## 2017-09-12 DIAGNOSIS — Z23 Encounter for immunization: Secondary | ICD-10-CM | POA: Diagnosis not present

## 2017-09-12 DIAGNOSIS — Z87891 Personal history of nicotine dependence: Secondary | ICD-10-CM | POA: Diagnosis not present

## 2017-09-12 DIAGNOSIS — Q211 Atrial septal defect: Secondary | ICD-10-CM | POA: Diagnosis not present

## 2017-09-12 DIAGNOSIS — J441 Chronic obstructive pulmonary disease with (acute) exacerbation: Secondary | ICD-10-CM

## 2017-09-12 DIAGNOSIS — N183 Chronic kidney disease, stage 3 (moderate): Secondary | ICD-10-CM | POA: Diagnosis present

## 2017-09-12 DIAGNOSIS — R0902 Hypoxemia: Secondary | ICD-10-CM | POA: Diagnosis present

## 2017-09-12 DIAGNOSIS — D72829 Elevated white blood cell count, unspecified: Secondary | ICD-10-CM | POA: Diagnosis not present

## 2017-09-12 DIAGNOSIS — J440 Chronic obstructive pulmonary disease with acute lower respiratory infection: Secondary | ICD-10-CM | POA: Diagnosis present

## 2017-09-12 DIAGNOSIS — J302 Other seasonal allergic rhinitis: Secondary | ICD-10-CM | POA: Diagnosis not present

## 2017-09-12 DIAGNOSIS — I69320 Aphasia following cerebral infarction: Secondary | ICD-10-CM

## 2017-09-12 DIAGNOSIS — R0600 Dyspnea, unspecified: Secondary | ICD-10-CM | POA: Diagnosis not present

## 2017-09-12 DIAGNOSIS — J45901 Unspecified asthma with (acute) exacerbation: Secondary | ICD-10-CM

## 2017-09-12 DIAGNOSIS — Z841 Family history of disorders of kidney and ureter: Secondary | ICD-10-CM

## 2017-09-12 DIAGNOSIS — Z86718 Personal history of other venous thrombosis and embolism: Secondary | ICD-10-CM | POA: Diagnosis not present

## 2017-09-12 DIAGNOSIS — A403 Sepsis due to Streptococcus pneumoniae: Secondary | ICD-10-CM | POA: Diagnosis not present

## 2017-09-12 DIAGNOSIS — E785 Hyperlipidemia, unspecified: Secondary | ICD-10-CM | POA: Diagnosis not present

## 2017-09-12 DIAGNOSIS — L509 Urticaria, unspecified: Secondary | ICD-10-CM

## 2017-09-12 DIAGNOSIS — J181 Lobar pneumonia, unspecified organism: Secondary | ICD-10-CM | POA: Diagnosis not present

## 2017-09-12 DIAGNOSIS — E871 Hypo-osmolality and hyponatremia: Secondary | ICD-10-CM | POA: Diagnosis present

## 2017-09-12 DIAGNOSIS — R748 Abnormal levels of other serum enzymes: Secondary | ICD-10-CM | POA: Diagnosis not present

## 2017-09-12 DIAGNOSIS — Z7951 Long term (current) use of inhaled steroids: Secondary | ICD-10-CM

## 2017-09-12 DIAGNOSIS — R4701 Aphasia: Secondary | ICD-10-CM | POA: Diagnosis not present

## 2017-09-12 DIAGNOSIS — I214 Non-ST elevation (NSTEMI) myocardial infarction: Secondary | ICD-10-CM | POA: Diagnosis not present

## 2017-09-12 DIAGNOSIS — A419 Sepsis, unspecified organism: Secondary | ICD-10-CM | POA: Diagnosis not present

## 2017-09-12 DIAGNOSIS — Z79899 Other long term (current) drug therapy: Secondary | ICD-10-CM | POA: Diagnosis not present

## 2017-09-12 DIAGNOSIS — I248 Other forms of acute ischemic heart disease: Secondary | ICD-10-CM | POA: Diagnosis not present

## 2017-09-12 DIAGNOSIS — Z66 Do not resuscitate: Secondary | ICD-10-CM | POA: Diagnosis not present

## 2017-09-12 DIAGNOSIS — Z887 Allergy status to serum and vaccine status: Secondary | ICD-10-CM

## 2017-09-12 DIAGNOSIS — F329 Major depressive disorder, single episode, unspecified: Secondary | ICD-10-CM | POA: Diagnosis not present

## 2017-09-12 DIAGNOSIS — I2510 Atherosclerotic heart disease of native coronary artery without angina pectoris: Secondary | ICD-10-CM | POA: Diagnosis present

## 2017-09-12 DIAGNOSIS — I639 Cerebral infarction, unspecified: Secondary | ICD-10-CM | POA: Diagnosis not present

## 2017-09-12 DIAGNOSIS — J189 Pneumonia, unspecified organism: Secondary | ICD-10-CM

## 2017-09-12 DIAGNOSIS — F419 Anxiety disorder, unspecified: Secondary | ICD-10-CM | POA: Diagnosis present

## 2017-09-12 DIAGNOSIS — Z955 Presence of coronary angioplasty implant and graft: Secondary | ICD-10-CM

## 2017-09-12 DIAGNOSIS — N179 Acute kidney failure, unspecified: Secondary | ICD-10-CM | POA: Diagnosis not present

## 2017-09-12 DIAGNOSIS — R0602 Shortness of breath: Secondary | ICD-10-CM | POA: Diagnosis not present

## 2017-09-12 DIAGNOSIS — I10 Essential (primary) hypertension: Secondary | ICD-10-CM | POA: Diagnosis not present

## 2017-09-12 DIAGNOSIS — I69322 Dysarthria following cerebral infarction: Secondary | ICD-10-CM | POA: Diagnosis not present

## 2017-09-12 DIAGNOSIS — Z7901 Long term (current) use of anticoagulants: Secondary | ICD-10-CM

## 2017-09-12 DIAGNOSIS — Z8249 Family history of ischemic heart disease and other diseases of the circulatory system: Secondary | ICD-10-CM

## 2017-09-12 LAB — URINALYSIS, COMPLETE (UACMP) WITH MICROSCOPIC
BACTERIA UA: NONE SEEN
BILIRUBIN URINE: NEGATIVE
Glucose, UA: NEGATIVE mg/dL
Ketones, ur: NEGATIVE mg/dL
LEUKOCYTES UA: NEGATIVE
NITRITE: NEGATIVE
PROTEIN: NEGATIVE mg/dL
SPECIFIC GRAVITY, URINE: 1.011 (ref 1.005–1.030)
WBC UA: NONE SEEN WBC/hpf (ref 0–5)
pH: 5 (ref 5.0–8.0)

## 2017-09-12 LAB — BASIC METABOLIC PANEL
Anion gap: 11 (ref 5–15)
BUN: 29 mg/dL — AB (ref 6–20)
CALCIUM: 9.3 mg/dL (ref 8.9–10.3)
CO2: 23 mmol/L (ref 22–32)
Chloride: 96 mmol/L — ABNORMAL LOW (ref 101–111)
Creatinine, Ser: 1.7 mg/dL — ABNORMAL HIGH (ref 0.44–1.00)
GFR calc Af Amer: 32 mL/min — ABNORMAL LOW (ref >60)
GFR, EST NON AFRICAN AMERICAN: 27 mL/min — AB (ref >60)
GLUCOSE: 115 mg/dL — AB (ref 65–99)
Potassium: 4.5 mmol/L (ref 3.5–5.1)
Sodium: 130 mmol/L — ABNORMAL LOW (ref 135–145)

## 2017-09-12 LAB — CBC WITH DIFFERENTIAL/PLATELET
Basophils Absolute: 0 10*3/uL (ref 0–0.1)
Basophils Relative: 0 %
EOS PCT: 0 %
Eosinophils Absolute: 0 10*3/uL (ref 0–0.7)
HEMATOCRIT: 34.4 % — AB (ref 35.0–47.0)
Hemoglobin: 11.3 g/dL — ABNORMAL LOW (ref 12.0–16.0)
LYMPHS ABS: 0.8 10*3/uL — AB (ref 1.0–3.6)
LYMPHS PCT: 4 %
MCH: 31.2 pg (ref 26.0–34.0)
MCHC: 32.8 g/dL (ref 32.0–36.0)
MCV: 95.2 fL (ref 80.0–100.0)
MONOS PCT: 3 %
Monocytes Absolute: 0.6 10*3/uL (ref 0.2–0.9)
NEUTROS ABS: 19.7 10*3/uL — AB (ref 1.4–6.5)
Neutrophils Relative %: 93 %
PLATELETS: 250 10*3/uL (ref 150–440)
RBC: 3.62 MIL/uL — ABNORMAL LOW (ref 3.80–5.20)
RDW: 15 % — AB (ref 11.5–14.5)
WBC: 21.1 10*3/uL — ABNORMAL HIGH (ref 3.6–11.0)

## 2017-09-12 LAB — INFLUENZA PANEL BY PCR (TYPE A & B)
INFLAPCR: NEGATIVE
INFLBPCR: NEGATIVE

## 2017-09-12 LAB — LACTIC ACID, PLASMA: Lactic Acid, Venous: 1.9 mmol/L (ref 0.5–1.9)

## 2017-09-12 LAB — TROPONIN I
Troponin I: 0.05 ng/mL (ref <0.03)
Troponin I: <0.03 ng/mL (ref <0.03)

## 2017-09-12 MED ORDER — ACETAMINOPHEN 325 MG PO TABS
650.0000 mg | ORAL_TABLET | Freq: Four times a day (QID) | ORAL | Status: DC | PRN
  Administered 2017-09-12: 650 mg via ORAL
  Filled 2017-09-12: qty 2

## 2017-09-12 MED ORDER — AZITHROMYCIN 500 MG PO TABS
250.0000 mg | ORAL_TABLET | Freq: Every day | ORAL | Status: DC
  Administered 2017-09-13 – 2017-09-14 (×2): 250 mg via ORAL
  Filled 2017-09-12 (×2): qty 1

## 2017-09-12 MED ORDER — DEXTROSE 5 % IV SOLN
1.0000 g | INTRAVENOUS | Status: DC
  Filled 2017-09-12: qty 10

## 2017-09-12 MED ORDER — SODIUM CHLORIDE 0.9 % IV SOLN
INTRAVENOUS | Status: DC
  Administered 2017-09-12 – 2017-09-14 (×3): via INTRAVENOUS

## 2017-09-12 MED ORDER — LORATADINE 10 MG PO TABS
10.0000 mg | ORAL_TABLET | Freq: Every day | ORAL | Status: DC
  Administered 2017-09-13 – 2017-09-15 (×3): 10 mg via ORAL
  Filled 2017-09-12 (×3): qty 1

## 2017-09-12 MED ORDER — AMLODIPINE BESYLATE 5 MG PO TABS
5.0000 mg | ORAL_TABLET | Freq: Every day | ORAL | Status: DC
  Administered 2017-09-14 – 2017-09-15 (×2): 5 mg via ORAL
  Filled 2017-09-12 (×3): qty 1

## 2017-09-12 MED ORDER — DEXTROSE 5 % IV SOLN
1.0000 g | Freq: Once | INTRAVENOUS | Status: AC
  Administered 2017-09-12: 1 g via INTRAVENOUS
  Filled 2017-09-12: qty 10

## 2017-09-12 MED ORDER — ACETAMINOPHEN 650 MG RE SUPP: 650.0000 mg | Freq: Four times a day (QID) | RECTAL | Status: DC | PRN

## 2017-09-12 MED ORDER — IPRATROPIUM-ALBUTEROL 0.5-2.5 (3) MG/3ML IN SOLN
3.0000 mL | Freq: Once | RESPIRATORY_TRACT | Status: AC
  Administered 2017-09-12: 3 mL via RESPIRATORY_TRACT
  Filled 2017-09-12: qty 3

## 2017-09-12 MED ORDER — PAROXETINE HCL 20 MG PO TABS
20.0000 mg | ORAL_TABLET | Freq: Every day | ORAL | Status: DC
  Administered 2017-09-13 – 2017-09-14 (×2): 20 mg via ORAL
  Filled 2017-09-12 (×2): qty 1

## 2017-09-12 MED ORDER — TIOTROPIUM BROMIDE MONOHYDRATE 18 MCG IN CAPS
18.0000 ug | ORAL_CAPSULE | Freq: Every day | RESPIRATORY_TRACT | Status: DC
Start: 2017-09-13 — End: 2017-09-15
  Administered 2017-09-13 – 2017-09-15 (×3): 18 ug via RESPIRATORY_TRACT
  Filled 2017-09-12: qty 5

## 2017-09-12 MED ORDER — BUDESONIDE 0.5 MG/2ML IN SUSP
0.5000 mg | Freq: Two times a day (BID) | RESPIRATORY_TRACT | Status: DC
  Administered 2017-09-12 – 2017-09-15 (×6): 0.5 mg via RESPIRATORY_TRACT
  Filled 2017-09-12 (×6): qty 2

## 2017-09-12 MED ORDER — DOCUSATE SODIUM 100 MG PO CAPS
200.0000 mg | ORAL_CAPSULE | Freq: Every day | ORAL | Status: DC
  Administered 2017-09-12 – 2017-09-14 (×3): 200 mg via ORAL
  Filled 2017-09-12 (×3): qty 2

## 2017-09-12 MED ORDER — GABAPENTIN 100 MG PO CAPS
100.0000 mg | ORAL_CAPSULE | Freq: Two times a day (BID) | ORAL | Status: DC
  Administered 2017-09-12 – 2017-09-15 (×6): 100 mg via ORAL
  Filled 2017-09-12 (×6): qty 1

## 2017-09-12 MED ORDER — PAROXETINE HCL 10 MG PO TABS
10.0000 mg | ORAL_TABLET | Freq: Every day | ORAL | Status: DC
  Administered 2017-09-13 – 2017-09-15 (×3): 10 mg via ORAL
  Filled 2017-09-12 (×3): qty 1

## 2017-09-12 MED ORDER — FAMOTIDINE 20 MG PO TABS
20.0000 mg | ORAL_TABLET | ORAL | Status: DC
  Administered 2017-09-13 – 2017-09-15 (×3): 20 mg via ORAL
  Filled 2017-09-12 (×3): qty 1

## 2017-09-12 MED ORDER — CALCIUM CARBONATE-VITAMIN D 500-200 MG-UNIT PO TABS
2.0000 | ORAL_TABLET | Freq: Every day | ORAL | Status: DC
  Administered 2017-09-13 – 2017-09-15 (×3): 2 via ORAL
  Filled 2017-09-12 (×3): qty 2

## 2017-09-12 MED ORDER — APIXABAN 5 MG PO TABS
5.0000 mg | ORAL_TABLET | Freq: Two times a day (BID) | ORAL | Status: DC
  Administered 2017-09-12 – 2017-09-15 (×6): 5 mg via ORAL
  Filled 2017-09-12 (×6): qty 1

## 2017-09-12 MED ORDER — SODIUM CHLORIDE 0.9 % IV BOLUS (SEPSIS)
1000.0000 mL | Freq: Once | INTRAVENOUS | Status: AC
  Administered 2017-09-12: 1000 mL via INTRAVENOUS

## 2017-09-12 MED ORDER — IPRATROPIUM-ALBUTEROL 0.5-2.5 (3) MG/3ML IN SOLN
3.0000 mL | Freq: Four times a day (QID) | RESPIRATORY_TRACT | Status: DC
  Administered 2017-09-12 – 2017-09-13 (×5): 3 mL via RESPIRATORY_TRACT
  Filled 2017-09-12 (×5): qty 3

## 2017-09-12 MED ORDER — METHYLPREDNISOLONE SODIUM SUCC 40 MG IJ SOLR
40.0000 mg | Freq: Every day | INTRAMUSCULAR | Status: DC
  Administered 2017-09-13 – 2017-09-14 (×2): 40 mg via INTRAVENOUS
  Filled 2017-09-12 (×3): qty 1

## 2017-09-12 MED ORDER — CLONAZEPAM 0.5 MG PO TABS
0.5000 mg | ORAL_TABLET | Freq: Every day | ORAL | Status: DC
  Administered 2017-09-12 – 2017-09-14 (×3): 0.5 mg via ORAL
  Filled 2017-09-12 (×3): qty 1

## 2017-09-12 MED ORDER — DEXTROSE 5 % IV SOLN
500.0000 mg | Freq: Once | INTRAVENOUS | Status: AC
  Administered 2017-09-12: 500 mg via INTRAVENOUS
  Filled 2017-09-12: qty 500

## 2017-09-12 NOTE — Telephone Encounter (Signed)
Agree with need to go to ER to evaluate for a new or impending stroke.

## 2017-09-12 NOTE — ED Provider Notes (Signed)
[facility] Emergency Department Provider Note  ____________________________________________   I have reviewed the triage vital signs and the nursing notes.   HISTORY  Chief Complaint Shortness of Breath   History limited by: Not Limited   HPI Stephanie [name] is a 82 y.o. female who presents to the emergency department today because of concern for shortness of breath. Family states she does have a history of COPD and that the breathing has been worse since yesterday. She does not take any breathing treatments at home. Her shortness of breath has also been accompanied by unsteady gate and difficulty with speech. Patient did have a stroke last year. No fevers.   Per medical record review patient has a history of COPD, CAD.  Past Medical History:  Diagnosis Date  . Arthritis   . Complication of anesthesia   . COPD (chronic obstructive pulmonary disease) ([place])   . Coronary artery disease 2013   stent  . Depression   . Edema   . Pneumonia    IN PAST  . PONV (postoperative nausea and vomiting)   . Stroke [facility])     Patient Active Problem List   Diagnosis Date Noted  . History of stroke 09/06/2017  . Essential hypertension 04/14/2017  . PFO (patent foramen ovale)   . DVT (deep venous thrombosis) ([place])   . Cerebrovascular accident (CVA) due to embolism of left middle cerebral artery ([place])   . Expressive aphasia   . Elevated troponin 08/18/2016  . Mild malnutrition ([place]) 08/18/2016  . Acute CVA (cerebrovascular accident) ([place]) 08/17/2016  . Acute exacerbation of chronic obstructive pulmonary disease (COPD) ([place]) 07/19/2016  . Depression 05/03/2016  . Mild dementia 04/19/2016  . COPD, moderate ([place]) 03/05/2015  . Anxiety and depression 03/04/2015  . Arteriosclerosis of coronary artery 03/04/2015  . CAFL (chronic airflow limitation) ([place]) 03/04/2015  . Breathlessness on exertion 03/04/2015  . HLD (hyperlipidemia) 03/04/2015  . Osteopenia  03/04/2015  . Peptic ulcer 03/04/2015  . Amnesia 03/20/2014  . Disordered sleep 03/20/2014  . Allergy to environmental factors 11/14/2013  . Gastroduodenal ulcer 11/14/2013  . GI bleed 11/14/2013    Past Surgical History:  Procedure Laterality Date  . BALLOON ANGIOPLASTY, ARTERY  05/17/2009  . CATARACT EXTRACTION W/PHACO Left 05/24/2016   Procedure: CATARACT EXTRACTION PHACO AND INTRAOCULAR LENS PLACEMENT (IOC);  Surgeon: Birder Robson, MD;  Location: [facility] ORS;  Service: Ophthalmology;  Laterality: Left;  Lot# [id number] H [place]:   00:52.3 AP%:   27.3 CDE:  14.26   . CATARACT EXTRACTION W/PHACO Right 06/14/2016   Procedure: CATARACT EXTRACTION PHACO AND INTRAOCULAR LENS PLACEMENT (IOC);  Surgeon: Birder Robson, MD;  Location: [facility] ORS;  Service: Ophthalmology;  Laterality: Right;  Lot# [id number] H [place]: 01:13.4 AP%: 24.4 CDE: 17.85  . CORONARY ANGIOPLASTY     STENT  . EP IMPLANTABLE DEVICE N/A 08/19/2016   Procedure: Loop Recorder Insertion;  Surgeon: Will Meredith Leeds, MD;  Location: [place] [facility];  Service: Cardiovascular;  Laterality: N/A;    Prior to Admission medications   Medication Sig Start Date End Date Taking? Authorizing Provider  acetaminophen (TYLENOL) 500 MG tablet Take 1 tablet by mouth 2 (two) times daily as needed.     [provider]  albuterol (VENTOLIN HFA) 108 (90 Base) MCG/ACT inhaler Inhale 2 puffs into the lungs every 6 (six) hours as needed. 12/05/16   Chrismon, Vickki Muff, PA  amLODipine (NORVASC) 5 MG tablet Take 5 mg by mouth daily.    [provider]  Calcium Carb-Cholecalciferol (CALCIUM 600+D) 600-800 MG-UNIT TABS Take 2 tablets by mouth daily.    [provider]  cetirizine (ZYRTEC) 10 MG tablet Take 10 mg by mouth daily.    [provider]  clonazePAM (KLONOPIN) 0.5 MG tablet TAKE 1 TABLET BY MOUTH TWICE DAILY AS NEEDED FOR ANXIETY 12/23/16   Birdie Sons, MD  docusate sodium (COLACE) 100 MG capsule Take 200 mg  by mouth at bedtime.    [provider]  ELIQUIS 5 MG TABS tablet TAKE ONE (1) TABLET BY MOUTH TWO (2) TIMES DAILY 04/11/17   Chrismon, Vickki Muff, PA  gabapentin (NEURONTIN) 100 MG capsule TAKE 1 CAPSULE BY MOUTH THREE TIMES A DAY 05/18/17   Chrismon, Vickki Muff, PA  Multiple Vitamins-Iron (MULTIVITAMIN/IRON PO) Take by mouth.    [provider]  PARoxetine (PAXIL) 10 MG tablet Take 2 tablets (20 mg total) by mouth at bedtime. Take 1 tablet in the morning and 2 tablets at bedtime Patient taking differently: Take 20 mg by mouth. Take 1 tablet in the morning and 2 tablets at bedtime 06/27/17   Chrismon, Vickki Muff, PA  ranitidine (ZANTAC 75) 75 MG tablet Take 1 tablet (75 mg total) by mouth 2 (two) times daily. 08/23/17 11/21/17  Trinna Post, PA-C  Spacer/Aero Chamber Mouthpiece MISC Use spacer on inhalers for better administration. 10/06/16   Chrismon, Vickki Muff, PA  SPIRIVA HANDIHALER 18 MCG inhalation capsule INHALE CONTENTS OF ONE CAPSULE ONCE A DAY AS DIRECTED 02/18/17   Birdie Sons, MD  SYMBICORT 160-4.5 MCG/ACT inhaler INHALE TWO PUFFS INTO THE LUNGS TWICE A DAY 02/20/17   Chrismon, Vickki Muff, PA    Allergies Duloxetine hcl; Influenza vaccines; and Venlafaxine  Family History  Problem Relation Age of Onset  . Hypertension Mother   . Diabetes Mother   . Pancreatic cancer Mother 58  . Cancer Mother   . Suicidality Father 47  . Hypertension Father   . Alcohol abuse Father   . Hypertension Sister   . Heart disease Sister   . Kidney disease Sister   . Varicose Veins Sister   . Parkinson's disease Sister     Social History Social History   Tobacco Use  . Smoking status: Former Smoker    Last attempt to quit: 08/07/2001    Years since quitting: 16.1  . Smokeless tobacco: Never Used  . Tobacco comment: QUIT IN 2002  Substance Use Topics  . Alcohol use: No    Alcohol/week: 0.0 oz  . Drug use: No    Review of Systems Constitutional: No fever/chills Eyes: No  visual changes. ENT: No sore throat. Cardiovascular: Denies chest pain. Respiratory: Positive for shortness of breath. Gastrointestinal: No abdominal pain.  No nausea, no vomiting.  No diarrhea.   Genitourinary: Negative for dysuria. Musculoskeletal: Negative for back pain. Skin: Negative for rash. Neurological: Positive for slurred speech.  ____________________________________________   PHYSICAL EXAM:  VITAL SIGNS: ED Triage Vitals  Enc Vitals Group     BP 09/12/17 1624 122/66     Pulse Rate 09/12/17 1624 100     Resp 09/12/17 1624 (!) 22     Temp 09/12/17 1624 98.4 F (36.9 C)     Temp Source 09/12/17 1624 Oral     SpO2 09/12/17 1624 92 %     Weight 09/12/17 1625 121 lb (54.9 kg)   Constitutional: Alert and oriented. Well appearing and in no distress. Eyes: Conjunctivae are normal.  ENT   Head: Normocephalic and atraumatic.  Nose: No congestion/rhinnorhea.   Mouth/Throat: Mucous membranes are moist.   Neck: No stridor. Hematological/Lymphatic/Immunilogical: No cervical lymphadenopathy. Cardiovascular: Normal rate, regular rhythm.  No murmurs, rubs, or gallops.  Respiratory: Normal respiratory effort without tachypnea nor retractions. Breath sounds are clear and equal bilaterally. No wheezes/rales/rhonchi. Gastrointestinal: Soft and non tender. No rebound. No guarding.  Genitourinary: Deferred Musculoskeletal: Normal range of motion in all extremities. No lower extremity edema. Neurologic:  Normal speech and language. No gross focal neurologic deficits are appreciated.  Skin:  Skin is warm, dry and intact. No rash noted. Psychiatric: Mood and affect are normal. Speech and behavior are normal. Patient exhibits appropriate insight and judgment.  ____________________________________________    LABS (pertinent positives/negatives)  Trop 0.05 CBC wbc 21.1, hgb 11.3, plt 250 BMP na 130, cr 1.70 UA not consistent with  infection  ____________________________________________   EKG  I, Nance Pear, attending physician, personally viewed and interpreted this EKG  EKG Time: 1628 Rate: 96 Rhythm: normal sinus rhythm Axis: normal Intervals: qtc 429 QRS: narrow ST changes: t wave inversino v5-v6 Impression: abnormal ekg  ____________________________________________    RADIOLOGY  CT head No acute disease  CXR Concern for pneumonia  ____________________________________________   PROCEDURES  Procedures  CRITICAL CARE Performed by: Nance Pear   Total critical care time: 35 minutes  Critical care time was exclusive of separately billable procedures and treating other patients.  Critical care was necessary to treat or prevent imminent or life-threatening deterioration.  Critical care was time spent personally by me on the following activities: development of treatment plan with patient and/or surrogate as well as nursing, discussions with consultants, evaluation of patient's response to treatment, examination of patient, obtaining history from patient or surrogate, ordering and performing treatments and interventions, ordering and review of laboratory studies, ordering and review of radiographic studies, pulse oximetry and re-evaluation of patient's condition.  ____________________________________________   INITIAL IMPRESSION / ASSESSMENT AND PLAN / ED COURSE  Pertinent labs & imaging results that were available during my care of the patient were reviewed by me and considered in my medical decision making (see chart for details).  Patient presented to the emergency department with complaints of shortness of breath, weakness, gait instability and slurred speech.  Differential would be broad.  Patient has a history of CVA so new stroke would be of concern.  CT head was obtained without any acute findings.  Additionally would consider infections or dehydration which could cause  worsening of previous stroke symptoms.  Terms of this workup urine without any evidence of obvious infection.  Chest x-ray however is consistent with pneumonia.  Additionally patient had significant leukocytosis.  Given concern for sepsis patient was started on broad-spectrum IV antibiotics as well as fluids.  Discussed these findings with patient and family.  Discussed plan for admission to hospital service for further treatment of pneumonia.   ____________________________________________   FINAL CLINICAL IMPRESSION(S) / ED DIAGNOSES  Final diagnoses:  Shortness of breath  Community acquired pneumonia, unspecified laterality  Leukocytosis, unspecified type     Note: This dictation was prepared with Dragon dictation. Any transcriptional errors that result from this process are unintentional     Nance Pear, MD 09/12/17 1843

## 2017-09-12 NOTE — ED Triage Notes (Signed)
Pt with increased shortness of breath today. Pt with hx COPD.

## 2017-09-12 NOTE — ED Notes (Signed)
Patient ambulated to restroom with one assist. Patient became tachypnic and tachycardic upon ambulation. Patient also experience audible wheezing upon returning to bed. Oxygen saturation dropped to 89% but returned to 95% on room air upon resting.

## 2017-09-12 NOTE — H&P (Signed)
[facility] [place] NAME: Stephanie [name]    MR#:  [id number]  DATE OF BIRTH:  [birth year]  DATE OF ADMISSION:  09/12/2017  PRIMARY CARE PHYSICIAN: Margo Common, PA   REQUESTING/REFERRING PHYSICIAN: Dr Charolett Bumpers  CHIEF COMPLAINT:   Chief Complaint  Patient presents with  . Shortness of Breath    HISTORY OF PRESENT ILLNESS:  Stephanie [name]  is a 82 y.o. female with a known history of COPD presents with shortness of breath.  Patient having some cough and wheezing.  She was weak and off balance with walking around.  Had some cold chills.  Had some diarrhea the other day.  Patient was found to have a right upper lobe and right lower lobe pneumonia, elevated white blood cell count and was tachycardic.  Hospitalist services were contacted for further evaluation.  PAST MEDICAL HISTORY:   Past Medical History:  Diagnosis Date  . Arthritis   . Complication of anesthesia   . COPD (chronic obstructive pulmonary disease) ([place])   . Coronary artery disease 2013   stent  . Depression   . Edema   . Pneumonia    IN PAST  . PONV (postoperative nausea and vomiting)   . Stroke [facility])     PAST SURGICAL HISTORY:   Past Surgical History:  Procedure Laterality Date  . BALLOON ANGIOPLASTY, ARTERY  05/17/2009  . CATARACT EXTRACTION W/PHACO Left 05/24/2016   Procedure: CATARACT EXTRACTION PHACO AND INTRAOCULAR LENS PLACEMENT (IOC);  Surgeon: Birder Robson, MD;  Location: [facility] ORS;  Service: Ophthalmology;  Laterality: Left;  Lot# [id number] H [place]:   00:52.3 AP%:   27.3 CDE:  14.26   . CATARACT EXTRACTION W/PHACO Right 06/14/2016   Procedure: CATARACT EXTRACTION PHACO AND INTRAOCULAR LENS PLACEMENT (IOC);  Surgeon: Birder Robson, MD;  Location: [facility] ORS;  Service: Ophthalmology;  Laterality: Right;  Lot# [id number] H [place]: 01:13.4 AP%: 24.4 CDE: 17.85  . CORONARY ANGIOPLASTY     STENT  . EP IMPLANTABLE DEVICE N/A 08/19/2016    Procedure: Loop Recorder Insertion;  Surgeon: Will Meredith Leeds, MD;  Location: [place] [facility] LAB;  Service: Cardiovascular;  Laterality: N/A;    SOCIAL HISTORY:   Social History   Tobacco Use  . Smoking status: Former Smoker    Last attempt to quit: 08/07/2001    Years since quitting: 16.1  . Smokeless tobacco: Never Used  . Tobacco comment: QUIT IN 2002  Substance Use Topics  . Alcohol use: No    Alcohol/week: 0.0 oz    FAMILY HISTORY:   Family History  Problem Relation Age of Onset  . Hypertension Mother   . Diabetes Mother   . Pancreatic cancer Mother 76  . Cancer Mother   . Suicidality Father 52  . Hypertension Father   . Alcohol abuse Father   . Hypertension Sister   . Heart disease Sister   . Kidney disease Sister   . Varicose Veins Sister   . Parkinson's disease Sister     DRUG ALLERGIES:   Allergies  Allergen Reactions  . Duloxetine Hcl     unknown  . Influenza Vaccines     unknown  . Venlafaxine     GI distress    REVIEW OF SYSTEMS:  CONSTITUTIONAL: No fever, positive for cold chills.  Positive for fatigue and weakness.  EYES: No blurred or double vision.  EARS, NOSE, AND THROAT: No tinnitus or ear pain. No sore throat RESPIRATORY: Positive for cough and shortness of  breath.  Positive for some wheezing.  No hemoptysis.  CARDIOVASCULAR: No chest pain, orthopnea, edema.  GASTROINTESTINAL: No nausea, vomiting, or abdominal pain. No blood in bowel movements.  Positive for diarrhea the other day GENITOURINARY: No dysuria, hematuria.  ENDOCRINE: No polyuria, nocturia,  HEMATOLOGY: No anemia, easy bruising or bleeding SKIN: No rash or lesion. MUSCULOSKELETAL: No joint pain or arthritis.   NEUROLOGIC: No tingling, numbness, weakness.  PSYCHIATRY: No anxiety or depression.   MEDICATIONS AT HOME:   Prior to Admission medications   Medication Sig Start Date End Date Taking? Authorizing Provider  acetaminophen (TYLENOL) 500 MG tablet Take 1,000 mg  by mouth 2 (two) times daily as needed.    Yes [provider]  albuterol (VENTOLIN HFA) 108 (90 Base) MCG/ACT inhaler Inhale 2 puffs into the lungs every 6 (six) hours as needed. 12/05/16  Yes Chrismon, Vickki Muff, PA  amLODipine (NORVASC) 5 MG tablet Take 5 mg by mouth daily.   Yes [provider]  Calcium Carb-Cholecalciferol (CALCIUM 600+D) 600-800 MG-UNIT TABS Take 2 tablets by mouth daily.   Yes [provider]  cetirizine (ZYRTEC) 10 MG tablet Take 10 mg by mouth daily.   Yes [provider]  clonazePAM (KLONOPIN) 0.5 MG tablet TAKE 1 TABLET BY MOUTH TWICE DAILY AS NEEDED FOR ANXIETY Patient taking differently: TAKE 1 TABLET (0.5MG ) BY MOUTH AT BEDTIME 12/23/16  Yes Birdie Sons, MD  docusate sodium (COLACE) 250 MG capsule Take 250 mg by mouth at bedtime.    Yes [provider]  ELIQUIS 5 MG TABS tablet TAKE ONE (1) TABLET BY MOUTH TWO (2) TIMES DAILY 04/11/17  Yes Chrismon, Vickki Muff, PA  gabapentin (NEURONTIN) 100 MG capsule TAKE 1 CAPSULE BY MOUTH THREE TIMES A DAY Patient taking differently: TAKE 1 CAPSULE (100MG ) BY MOUTH TWICE DAILY 05/18/17  Yes Chrismon, Vickki Muff, PA  PARoxetine (PAXIL) 10 MG tablet Take 2 tablets (20 mg total) by mouth at bedtime. Take 1 tablet in the morning and 2 tablets at bedtime Patient taking differently: Take 10-20 mg by mouth daily. Take 1 tablet (10MG ) in the morning and 2 tablets (20MG ) at bedtime 06/27/17  Yes Chrismon, Vickki Muff, PA  ranitidine (ZANTAC 75) 75 MG tablet Take 1 tablet (75 mg total) by mouth 2 (two) times daily. Patient taking differently: Take 37.5 mg by mouth 2 (two) times daily.  08/23/17 11/21/17 Yes Trinna Post, PA-C  Spacer/Aero Chamber Mouthpiece MISC Use spacer on inhalers for better administration. 10/06/16  Yes Chrismon, Vickki Muff, PA  SPIRIVA HANDIHALER 18 MCG inhalation capsule INHALE CONTENTS OF ONE CAPSULE ONCE A DAY AS DIRECTED 02/18/17  Yes Birdie Sons, MD  SYMBICORT 160-4.5  MCG/ACT inhaler INHALE TWO PUFFS INTO THE LUNGS TWICE A DAY 02/20/17  Yes Chrismon, Vickki Muff, PA      VITAL SIGNS:  Blood pressure 114/68, pulse 97, temperature 98.4 F (36.9 C), temperature source Oral, resp. rate 17, weight 54.9 kg (121 lb), SpO2 90 %.  PHYSICAL EXAMINATION:  GENERAL:  82 y.o.-year-old patient lying in the bed with no acute distress.  EYES: Pupils equal, round, reactive to light and accommodation. No scleral icterus. Extraocular muscles intact.  HEENT: Head atraumatic, normocephalic. Oropharynx and nasopharynx clear.  NECK:  Supple, no jugular venous distention. No thyroid enlargement, no tenderness.  LUNGS: Decreased breath sounds bilaterally,  positive expiratory wheezing.  No rales,rhonchi or crepitation. No use of accessory muscles of respiration.  CARDIOVASCULAR: S1, S2 normal. No murmurs, rubs, or gallops.  ABDOMEN: Soft, nontender, nondistended. Bowel sounds present. No organomegaly or mass.  EXTREMITIES: Trace edema, no cyanosis, or clubbing.  NEUROLOGIC: Cranial nerves II through XII are intact. Muscle strength 5/5 in all extremities. Sensation intact. Gait not checked.  PSYCHIATRIC: The patient is alert and oriented x 3.  SKIN: No rash, lesion, or ulcer.   LABORATORY PANEL:   CBC Recent Labs  Lab 09/12/17 1630  WBC 21.1*  HGB 11.3*  HCT 34.4*  PLT 250   ------------------------------------------------------------------------------------------------------------------  Chemistries  Recent Labs  Lab 09/12/17 1630  NA 130*  K 4.5  CL 96*  CO2 23  GLUCOSE 115*  BUN 29*  CREATININE 1.70*  CALCIUM 9.3   ------------------------------------------------------------------------------------------------------------------  Cardiac Enzymes Recent Labs  Lab 09/12/17 1630  TROPONINI 0.05*   ------------------------------------------------------------------------------------------------------------------  RADIOLOGY:  Dg Chest 2 View  Result Date:  09/12/2017 CLINICAL DATA:  Shortness of breath. EXAM: CHEST  2 VIEW COMPARISON:  Radiograph of July 21, 2016. FINDINGS: The heart size and mediastinal contours are within normal limits. Atherosclerosis of thoracic aorta is noted. No pneumothorax is noted. Right upper and lower lobe airspace opacities are noted concerning for pneumonia. No significant pleural effusion is noted. The visualized skeletal structures are unremarkable. IMPRESSION: Right upper lobe and lower lobe pneumonia. Aortic atherosclerosis. Followup PA and lateral chest X-ray is recommended in 3-4 weeks following trial of antibiotic therapy to ensure resolution and exclude underlying malignancy. Electronically Signed   By: Marijo Conception, M.D.   On: 09/12/2017 17:18   Ct Head Wo Contrast  Result Date: 09/12/2017 CLINICAL DATA:  82 y/o F; unsteady gait, jumbled speech, shortness of breath. History of CVA. EXAM: CT HEAD WITHOUT CONTRAST TECHNIQUE: Contiguous axial images were obtained from the base of the skull through the vertex without intravenous contrast. COMPARISON:  03/28/2011 CT head. FINDINGS: Brain: No evidence of acute infarction, hemorrhage, hydrocephalus, extra-axial collection or mass lesion/mass effect. Stable chronic infarcts in the bilateral superior cerebellar hemispheres and left genu of the corpus callosum. Stable chronic microvascular ischemic changes and parenchymal volume loss of the brain. Interval large chronic left posterior MCA distribution infarction. Ex vacuo dilatation of left lateral ventricle. Vascular: Calcific atherosclerosis of carotid siphons. No hyperdense vessel. Skull: Normal. Negative for fracture or focal lesion. Sinuses/Orbits: No acute finding. Other: None. IMPRESSION: 1. No acute intracranial abnormality identified. 2. Interval large left posterior MCA distribution chronic infarction. 3. Stable chronic infarctions of cerebellum and left genu of corpus callosum, chronic microvascular ischemic changes,  and parenchymal volume loss of the brain. Electronically Signed   By: Kristine Garbe M.D.   On: 09/12/2017 18:03    EKG:   Normal sinus rhythm 96 bpm  IMPRESSION AND PLAN:   1.  Clinical sepsis with pneumonia right upper lobe and right lower lobe, tachycardia and leukocytosis.  Rocephin and Zithromax prescribed.  Follow-up cultures 2.  COPD exacerbation start Solu-Medrol budesonide and DuoNeb nebulizer solution 3.  History of CVA on Eliquis 4.  Hyponatremia.  Likely secondary to sepsis.  Gentle IV fluid hydration 5.  Acute kidney injury on chronic kidney disease stage III.  Gentle IV fluid hydration.  Check BMP tomorrow morning 6.  Essential hypertension on Norvasc 7.  Borderline elevated troponin demand ischemia from sepsis  All the records are reviewed and case discussed with ED provider. Management plans discussed with the patient, family and they are in agreement.  CODE STATUS: DNR  TOTAL TIME TAKING CARE OF THIS PATIENT: 50 minutes.    Loletha Grayer M.D on  09/12/2017 at 8:10 PM  Between 7am to 6pm - Pager - [phone]  After 6pm call admission pager [phone]  [employer] [facility]  [phone]  CC: Primary care physician; Margo Common, PA

## 2017-09-12 NOTE — Telephone Encounter (Signed)
Patient's daughter called to report that patient has been off balance and slurred speech since yesterday. Mrs Shauna Hugh reports that patient is just very confussed. I advise Mrs. Faucette to go on and take patient to ER.

## 2017-09-12 NOTE — Progress Notes (Signed)
CODE SEPSIS - PHARMACY COMMUNICATION  **Broad Spectrum Antibiotics should be administered within 1 hour of Sepsis diagnosis**  Time Code Sepsis Called/Page Received: 18:17  Antibiotics Ordered: Ceftriaxone 1g IV                                      Azithromycin 500mg  IV  Time of 1st antibiotic administration: 18:17  Additional action taken by pharmacy: N/A  If necessary, Name of Provider/Nurse Contacted: N/A   Pernell Dupre, PharmD, BCPS Clinical Pharmacist 09/12/2017 6:25 PM

## 2017-09-12 NOTE — ED Notes (Signed)
First nurse note  Presents with family   Unsteady gait ,jumbled speech and SOB  Hx of COPD and cva in past   sxs' started last pm

## 2017-09-13 ENCOUNTER — Ambulatory Visit (INDEPENDENT_AMBULATORY_CARE_PROVIDER_SITE_OTHER): Payer: PPO | Admitting: *Deleted

## 2017-09-13 ENCOUNTER — Inpatient Hospital Stay (HOSPITAL_COMMUNITY)
Admit: 2017-09-13 | Discharge: 2017-09-13 | Disposition: A | Discharge status: Home / Self Care | Payer: PPO | Attending: Internal Medicine | Admitting: Internal Medicine

## 2017-09-13 DIAGNOSIS — I639 Cerebral infarction, unspecified: Secondary | ICD-10-CM

## 2017-09-13 DIAGNOSIS — R0600 Dyspnea, unspecified: Secondary | ICD-10-CM

## 2017-09-13 LAB — CBC
HCT: 29.5 % — ABNORMAL LOW (ref 35.0–47.0)
Hemoglobin: 9.9 g/dL — ABNORMAL LOW (ref 12.0–16.0)
MCH: 31.8 pg (ref 26.0–34.0)
MCHC: 33.4 g/dL (ref 32.0–36.0)
MCV: 95.3 fL (ref 80.0–100.0)
Platelets: 197 10*3/uL (ref 150–440)
RBC: 3.1 MIL/uL — ABNORMAL LOW (ref 3.80–5.20)
RDW: 15 % — AB (ref 11.5–14.5)
WBC: 13.2 10*3/uL — ABNORMAL HIGH (ref 3.6–11.0)

## 2017-09-13 LAB — BLOOD CULTURE ID PANEL (REFLEXED)
Acinetobacter baumannii: NOT DETECTED
CANDIDA PARAPSILOSIS: NOT DETECTED
Candida albicans: NOT DETECTED
Candida glabrata: NOT DETECTED
Candida krusei: NOT DETECTED
Candida tropicalis: NOT DETECTED
ENTEROCOCCUS SPECIES: NOT DETECTED
Enterobacter cloacae complex: NOT DETECTED
Enterobacteriaceae species: NOT DETECTED
Escherichia coli: NOT DETECTED
HAEMOPHILUS INFLUENZAE: NOT DETECTED
KLEBSIELLA OXYTOCA: NOT DETECTED
Klebsiella pneumoniae: NOT DETECTED
LISTERIA MONOCYTOGENES: NOT DETECTED
Neisseria meningitidis: NOT DETECTED
PSEUDOMONAS AERUGINOSA: NOT DETECTED
Proteus species: NOT DETECTED
SERRATIA MARCESCENS: NOT DETECTED
STAPHYLOCOCCUS AUREUS BCID: NOT DETECTED
STREPTOCOCCUS PYOGENES: NOT DETECTED
STREPTOCOCCUS SPECIES: DETECTED — AB
Staphylococcus species: NOT DETECTED
Streptococcus agalactiae: NOT DETECTED
Streptococcus pneumoniae: DETECTED — AB

## 2017-09-13 LAB — LACTIC ACID, PLASMA
Lactic Acid, Venous: 2.7 mmol/L (ref 0.5–1.9)
Lactic Acid, Venous: 2.2 mmol/L (ref 0.5–1.9)
Lactic Acid, Venous: 1.1 mmol/L (ref 0.5–1.9)

## 2017-09-13 LAB — BASIC METABOLIC PANEL
Anion gap: 8 (ref 5–15)
BUN: 21 mg/dL — AB (ref 6–20)
CALCIUM: 8.5 mg/dL — AB (ref 8.9–10.3)
CO2: 22 mmol/L (ref 22–32)
Chloride: 106 mmol/L (ref 101–111)
Creatinine, Ser: 1.23 mg/dL — ABNORMAL HIGH (ref 0.44–1.00)
GFR calc Af Amer: 47 mL/min — ABNORMAL LOW (ref >60)
GFR, EST NON AFRICAN AMERICAN: 41 mL/min — AB (ref >60)
GLUCOSE: 107 mg/dL — AB (ref 65–99)
Potassium: 3.5 mmol/L (ref 3.5–5.1)
Sodium: 136 mmol/L (ref 135–145)

## 2017-09-13 LAB — ECHOCARDIOGRAM COMPLETE
HEIGHTINCHES: 62 in
WEIGHTICAEL: 1936 [oz_av]

## 2017-09-13 LAB — TROPONIN I

## 2017-09-13 MED ORDER — DEXTROSE 5 % IV SOLN
2.0000 g | INTRAVENOUS | Status: DC
  Administered 2017-09-13 – 2017-09-14 (×2): 2 g via INTRAVENOUS
  Filled 2017-09-13 (×3): qty 2

## 2017-09-13 NOTE — Progress Notes (Signed)
*  PRELIMINARY RESULTS* Echocardiogram 2D Echocardiogram has been performed.  Sherrie Sport 09/13/2017, 2:16 PM

## 2017-09-13 NOTE — Progress Notes (Signed)
Family Meeting Note  Advance Directive:yes  Today a meeting took place with the Patient, sister at bed side     The following clinical team members were present during this meeting:MD  The following were discussed:Patient's diagnosis: Sepsis, right upper and lower lobe pneumonia, bronchoconstriction, acute kidney injury, possible streptococcal pneumonia, lives alone recent history of stroke with residual dysarthria, Patient's progosis: Unable to determine and Goals for treatment: DNR, all her 3 daughters Clista Bernhardt and Timmothy Sours are healthcare POA  Additional follow-up to be provided: Hospitalist  Time spent during discussion:18 min   Nicholes Mango, MD

## 2017-09-13 NOTE — Progress Notes (Signed)
Received in report that patient is requesting a bath.  Pt agreed during rounds with day shift nurse but then decided that she would wait until she was home as she expects to be discharged home tomorrow.  Re approached pt with offer for bath but she declined. Dorna Bloom RN

## 2017-09-13 NOTE — Progress Notes (Signed)
[facility] [place] NAME: Stephanie [name]    MR#:  [id number]  DATE OF BIRTH:  1935/09/29  SUBJECTIVE:  CHIEF COMPLAINT: Is out of bed to chair.  Had a stroke approximately 1 year ago with residual dysarthria.  Lives alone and takes care of herself.  Sister at bedside.  Patient reports shortness of breath is better   REVIEW OF SYSTEMS:  CONSTITUTIONAL: No fever, fatigue or weakness.  EYES: No blurred or double vision.  EARS, NOSE, AND THROAT: No tinnitus or ear pain.  RESPIRATORY: Reports intermittent episodes of cough and improving shortness of breath, denies wheezing or hemoptysis.   CARDIOVASCULAR: No chest pain, orthopnea, edema.  GASTROINTESTINAL: No nausea, vomiting, diarrhea or abdominal pain.  GENITOURINARY: No dysuria, hematuria.  ENDOCRINE: No polyuria, nocturia,  HEMATOLOGY: No anemia, easy bruising or bleeding SKIN: No rash or lesion. MUSCULOSKELETAL: No joint pain or arthritis.   NEUROLOGIC: No tingling, numbness, weakness.  PSYCHIATRY: No anxiety or depression.   DRUG ALLERGIES:   Allergies  Allergen Reactions  . Duloxetine Hcl     unknown  . Influenza Vaccines     unknown  . Venlafaxine     GI distress    VITALS:  Blood pressure (!) 123/54, pulse (!) 102, temperature 98.6 F (37 C), temperature source Oral, resp. rate 18, height 5\' 2"  (1.575 m), weight 54.9 kg (121 lb), SpO2 94 %.  PHYSICAL EXAMINATION:  GENERAL:  82 y.o.-year-old patient lying in the bed with no acute distress.  EYES: Pupils equal, round, reactive to light and accommodation. No scleral icterus. Extraocular muscles intact.  HEENT: Head atraumatic, normocephalic. Oropharynx and nasopharynx clear.  NECK:  Supple, no jugular venous distention. No thyroid enlargement, no tenderness.  LUNGS: Mod breath sounds bilaterally, no wheezing, rales,rhonchi or crepitation. No use of accessory muscles of respiration.  CARDIOVASCULAR: S1, S2 normal. No  murmurs, rubs, or gallops.  ABDOMEN: Soft, nontender, nondistended. Bowel sounds present. No organomegaly or mass.  EXTREMITIES: No pedal edema, cyanosis, or clubbing.  NEUROLOGIC: Cranial nerves II through XII are intact. Muscle strength 5/5 in all extremities. Sensation intact. Gait not checked.  Residual dysarthria from stroke in the past PSYCHIATRIC: The patient is alert and oriented x 3.  SKIN: No obvious rash, lesion, or ulcer.    LABORATORY PANEL:   CBC Recent Labs  Lab 09/13/17 0545  WBC 13.2*  HGB 9.9*  HCT 29.5*  PLT 197   ------------------------------------------------------------------------------------------------------------------  Chemistries  Recent Labs  Lab 09/13/17 0545  NA 136  K 3.5  CL 106  CO2 22  GLUCOSE 107*  BUN 21*  CREATININE 1.23*  CALCIUM 8.5*   ------------------------------------------------------------------------------------------------------------------  Cardiac Enzymes Recent Labs  Lab 09/12/17 2351  TROPONINI <0.03   ------------------------------------------------------------------------------------------------------------------  RADIOLOGY:  Dg Chest 2 View  Result Date: 09/12/2017 CLINICAL DATA:  Shortness of breath. EXAM: CHEST  2 VIEW COMPARISON:  Radiograph of July 21, 2016. FINDINGS: The heart size and mediastinal contours are within normal limits. Atherosclerosis of thoracic aorta is noted. No pneumothorax is noted. Right upper and lower lobe airspace opacities are noted concerning for pneumonia. No significant pleural effusion is noted. The visualized skeletal structures are unremarkable. IMPRESSION: Right upper lobe and lower lobe pneumonia. Aortic atherosclerosis. Followup PA and lateral chest X-ray is recommended in 3-4 weeks following trial of antibiotic therapy to ensure resolution and exclude underlying malignancy. Electronically Signed   By: Marijo Conception, M.D.   On: 09/12/2017 17:18   Ct  Head Wo  Contrast  Result Date: 09/12/2017 CLINICAL DATA:  82 y/o F; unsteady gait, jumbled speech, shortness of breath. History of CVA. EXAM: CT HEAD WITHOUT CONTRAST TECHNIQUE: Contiguous axial images were obtained from the base of the skull through the vertex without intravenous contrast. COMPARISON:  03/28/2011 CT head. FINDINGS: Brain: No evidence of acute infarction, hemorrhage, hydrocephalus, extra-axial collection or mass lesion/mass effect. Stable chronic infarcts in the bilateral superior cerebellar hemispheres and left genu of the corpus callosum. Stable chronic microvascular ischemic changes and parenchymal volume loss of the brain. Interval large chronic left posterior MCA distribution infarction. Ex vacuo dilatation of left lateral ventricle. Vascular: Calcific atherosclerosis of carotid siphons. No hyperdense vessel. Skull: Normal. Negative for fracture or focal lesion. Sinuses/Orbits: No acute finding. Other: None. IMPRESSION: 1. No acute intracranial abnormality identified. 2. Interval large left posterior MCA distribution chronic infarction. 3. Stable chronic infarctions of cerebellum and left genu of corpus callosum, chronic microvascular ischemic changes, and parenchymal volume loss of the brain. Electronically Signed   By: Kristine Garbe M.D.   On: 09/12/2017 18:03    EKG:   Orders placed or performed during the hospital encounter of 09/12/17  . ED EKG  . ED EKG  . EKG 12-Lead  . EKG 12-Lead    ASSESSMENT AND PLAN:    1.  Clinical sepsis with pneumonia right upper lobe and right lower lobe, tachycardia and leukocytosis.  Clinically improving, continue Rocephin and Zithromax.  Follow-up cultures Lactic acid is trending down 2.2-1.1 2.  COPD exacerbation start Solu-Medrol budesonide and DuoNeb nebulizer solution Incentive spirometry 3.  History of CVA on Eliquis 4.  Hyponatremia.   resolved with IV fluids 5.  Acute kidney injury on chronic kidney disease stage III.  Gentle  IV fluid hydration.  Check BMP tomorrow morning.  Creatinine at 1.23 today 6.  Essential hypertension on Norvasc 7.  Borderline elevated troponin demand ischemia from sepsis  PT consulted   All the records are reviewed and case discussed with Care Management/Social Workerr. Management plans discussed with the patient, SISTER and they are in agreement.  CODE STATUS: DNR   TOTAL TIME TAKING CARE OF THIS PATIENT: 36  minutes.   POSSIBLE D/C IN 1-2  DAYS, DEPENDING ON CLINICAL CONDITION.  Note: This dictation was prepared with Dragon dictation along with smaller phrase technology. Any transcriptional errors that result from this process are unintentional.   Nicholes Mango M.D on 09/13/2017 at 2:41 PM  Between 7am to 6pm - Pager - [phone] After 6pm go to [email or web address] - password [email or web address] [facility]  Office  [phone]  CC: Primary care physician; Margo Common, PA

## 2017-09-13 NOTE — Evaluation (Signed)
Physical Therapy Evaluation Patient Details Name: Stephanie [name] MRN: [id number] DOB: June 18, 1936 Today's Date: 09/13/2017   History of Present Illness  Pt admitted for sepsis and pneumonia. Pt with complaints of cough and wheezing. History includes COPD, depression, CAD, and CVA 1 year ago. Residual severe dysarthia, difficulty obtaining baseline due to poor historian.   Clinical Impression  Pt is a pleasant 82 year old female who was admitted for sepsis and pneumonia. Pt performs bed mobility/transfers with independence and ambulation with cga and no AD. Pt is self limiting in ambulation, refuses to further ambulate at this time. Vitals WNL. Pt demonstrates deficits with cognition/strength/endurance. Will continue to progress as able. Would benefit from skilled PT to address above deficits and promote optimal return to PLOF. Recommend transition to [facility] upon discharge from acute hospitalization.       Follow Up Recommendations Home health PT;Supervision/Assistance - 24 hour    Equipment Recommendations  (TBD)    Recommendations for Other Services       Precautions / Restrictions Precautions Precautions: Fall Restrictions Weight Bearing Restrictions: No      Mobility  Bed Mobility Overal bed mobility: Independent             General bed mobility comments: pt impulsive and needs cues to wait for therapist prior to performing transfer.   Transfers Overall transfer level: Independent Equipment used: None             General transfer comment: impulsive, slightly unsteady.  Ambulation/Gait Ambulation/Gait assistance: Min guard Ambulation Distance (Feet): 3 Feet Assistive device: None Gait Pattern/deviations: Step-to pattern     General Gait Details: ambulated to recliner, slightly unsteady. SOB symptoms noted, pt reports she can't ambulate further at this time. Symptoms improve almost immediately after sitting, however pt still refuses to ambulate  further  Stairs            Wheelchair Mobility    Modified Rankin (Stroke Patients Only)       Balance Overall balance assessment: Modified Independent                                           Pertinent Vitals/Pain Pain Assessment: No/denies pain    Home Living Family/patient expects to be discharged to:: Private residence Living Arrangements: Alone Available Help at Discharge: Family;Available PRN/intermittently(daughters "check" on her) Type of Home: House Home Access: (unable to answer)     Home Layout: (unsure)   Additional Comments: poor historian, daughter not in room. Will need to confirm.     Prior Function Level of Independence: Independent         Comments: Per RN, pt usually indep     Hand Dominance        Extremity/Trunk Assessment   Upper Extremity Assessment Upper Extremity Assessment: Overall WFL for tasks assessed    Lower Extremity Assessment Lower Extremity Assessment: Generalized weakness(B LE grossly 4/5)       Communication   Communication: Expressive difficulties  Cognition Arousal/Alertness: Awake/alert Behavior During Therapy: Impulsive Overall Cognitive Status: Impaired/Different from baseline                                        General Comments      Exercises     Assessment/Plan    PT Assessment Patient needs  continued PT services  PT Problem List Decreased strength;Decreased activity tolerance;Decreased balance;Decreased mobility;Decreased cognition;Decreased safety awareness;Cardiopulmonary status limiting activity       PT Treatment Interventions Gait training;DME instruction;Stair training;Therapeutic exercise;Balance training    PT Goals (Current goals can be found in the Care Plan section)  Acute Rehab PT Goals Patient Stated Goal: unable to state PT Goal Formulation: Patient unable to participate in goal setting Time For Goal Achievement: 09/27/17 Potential to  Achieve Goals: Good    Frequency Min 2X/week   Barriers to discharge Decreased caregiver support      Co-evaluation               AM-PAC PT "6 Clicks" Daily Activity  Outcome Measure Difficulty turning over in bed (including adjusting bedclothes, sheets and blankets)?: None Difficulty moving from lying on back to sitting on the side of the bed? : None Difficulty sitting down on and standing up from a chair with arms (e.g., wheelchair, bedside commode, etc,.)?: None Help needed moving to and from a bed to chair (including a wheelchair)?: A Little Help needed walking in hospital room?: A Little Help needed climbing 3-5 steps with a railing? : A Little 6 Click Score: 21    End of Session   Activity Tolerance: Treatment limited secondary to medical complications (Comment) Patient left: in chair;with chair alarm set Nurse Communication: Mobility status PT Visit Diagnosis: Unsteadiness on feet (R26.81);Muscle weakness (generalized) (M62.81);Difficulty in walking, not elsewhere classified (R26.2)    Time: 3646-8032 PT Time Calculation (min) (ACUTE ONLY): 17 min   Charges:   PT Evaluation $PT Eval Low Complexity: 1 Low     PT G CodesGreggory [name], PT, DPT [phone]   Keron Neenan 09/13/2017, 3:48 PM

## 2017-09-13 NOTE — Progress Notes (Signed)
PHARMACY - PHYSICIAN COMMUNICATION CRITICAL VALUE ALERT - BLOOD CULTURE IDENTIFICATION (BCID)  Stephanie [name] is an 82 y.o. female who presented to [facility] on 09/12/2017 with a chief complaint of SOB.  Assessment:  GPC 1/4 strep pneumo   Name of physician (or Provider) Contacted: Gouru  Current antibiotics: Patient is currently on ceftriaxone 1 g IV q24h + azithromycin 250 mg PO daily.  Changes to prescribed antibiotics recommended:  Increase ceftriaxone to 2 g IV daily  No results found for this or any previous visit.  Lenis Noon, PharmD 09/13/2017  12:02 PM

## 2017-09-14 MED ORDER — IPRATROPIUM-ALBUTEROL 0.5-2.5 (3) MG/3ML IN SOLN
3.0000 mL | Freq: Three times a day (TID) | RESPIRATORY_TRACT | Status: DC
Start: 2017-09-14 — End: 2017-09-15
  Administered 2017-09-14 – 2017-09-15 (×4): 3 mL via RESPIRATORY_TRACT
  Filled 2017-09-14 (×4): qty 3

## 2017-09-14 MED ORDER — IPRATROPIUM-ALBUTEROL 0.5-2.5 (3) MG/3ML IN SOLN: 3.0000 mL | Freq: Four times a day (QID) | RESPIRATORY_TRACT | Status: DC | PRN

## 2017-09-14 NOTE — Progress Notes (Signed)
Patient ID: Stephanie [name], female   DOB: [birth year], 82 y.o.   MRN: [id number]  [employer] PROGRESS NOTE  Stephanie [name] GHW:[id number] DOB: April 23, 1936 DOA: 09/12/2017 PCP: Margo Common, PA  HPI/Subjective: Patient feeling better and wanted to go home.  Still with some cough and some wheeze.  Some shortness of breath.  Objective: Vitals:   09/14/17 1140 09/14/17 1436  BP:  127/64  Pulse: 98 85  Resp:  18  Temp:  97.7 F (36.5 C)  SpO2: 93% 95%    Filed Weights   09/12/17 1625 09/12/17 2032 09/12/17 2323  Weight: 54.9 kg (121 lb) 54.9 kg (121 lb) 54.9 kg (121 lb)    ROS: Review of Systems  Constitutional: Negative for chills and fever.  Eyes: Negative for blurred vision.  Respiratory: Positive for cough, shortness of breath and wheezing.   Cardiovascular: Negative for chest pain.  Gastrointestinal: Negative for abdominal pain, constipation, diarrhea, nausea and vomiting.  Genitourinary: Negative for dysuria.  Musculoskeletal: Negative for joint pain.  Neurological: Negative for dizziness and headaches.   Exam: Physical Exam  Constitutional: She is oriented to person, place, and time.  HENT:  Nose: No mucosal edema.  Mouth/Throat: No oropharyngeal exudate or posterior oropharyngeal edema.  Eyes: Conjunctivae, EOM and lids are normal. Pupils are equal, round, and reactive to light.  Neck: No JVD present. Carotid bruit is not present. No edema present. No thyroid mass and no thyromegaly present.  Cardiovascular: S1 normal and S2 normal. Exam reveals no gallop.  No murmur heard. Pulses:      Dorsalis pedis pulses are 2+ on the right side, and 2+ on the left side.  Respiratory: No respiratory distress. She has decreased breath sounds in the right lower field and the left lower field. She has wheezes in the left middle field and the left lower field. She has no rhonchi. She has no rales.  GI: Soft. Bowel sounds are normal. There is no tenderness.   Musculoskeletal:       Right ankle: She exhibits no swelling.       Left ankle: She exhibits no swelling.  Lymphadenopathy:    She has no cervical adenopathy.  Neurological: She is alert and oriented to person, place, and time. No cranial nerve deficit.  Skin: Skin is warm. No rash noted. Nails show no clubbing.  Psychiatric: She has a normal mood and affect.      Data Reviewed: Basic Metabolic Panel: Recent Labs  Lab 09/12/17 1630 09/13/17 0545  NA 130* 136  K 4.5 3.5  CL 96* 106  CO2 23 22  GLUCOSE 115* 107*  BUN 29* 21*  CREATININE 1.70* 1.23*  CALCIUM 9.3 8.5*   CBC: Recent Labs  Lab 09/12/17 1630 09/13/17 0545  WBC 21.1* 13.2*  NEUTROABS 19.7*  --   HGB 11.3* 9.9*  HCT 34.4* 29.5*  MCV 95.2 95.3  PLT 250 197   Cardiac Enzymes: Recent Labs  Lab 09/12/17 1630 09/12/17 2018 09/12/17 2351  TROPONINI 0.05* <0.03 <0.03     Recent Results (from the past 240 hour(s))  Blood Culture (routine x 2)     Status: None (Preliminary result)   Collection Time: 09/12/17  5:44 PM  Result Value Ref Range Status   Specimen Description BLOOD RIGHT ANTECUBITAL  Final   Special Requests   Final    BOTTLES DRAWN AEROBIC AND ANAEROBIC Blood Culture adequate volume   Culture  Setup Time   Final    GRAM POSITIVE COCCI  ANAEROBIC BOTTLE ONLY CRITICAL VALUE NOTED.  VALUE IS CONSISTENT WITH PREVIOUSLY REPORTED AND CALLED VALUE. Performed at [facility], [address] [place], [place] [address]    Culture [facility] POSITIVE COCCI  Final   Report Status PENDING  Incomplete  Blood Culture (routine x 2)     Status: Abnormal (Preliminary result)   Collection Time: 09/12/17  5:49 PM  Result Value Ref Range Status   Specimen Description   Final    BLOOD LEFT ANTECUBITAL Performed at [facility], [address] [place], [place] [address]    Special Requests   Final    BOTTLES DRAWN AEROBIC AND ANAEROBIC Blood Culture adequate volume Performed at [facility], [address] [place], [place] [address]    Culture  Setup Time   Final    GRAM POSITIVE COCCI ANAEROBIC BOTTLE ONLY CRITICAL RESULT CALLED TO, READ BACK BY AND VERIFIED WITH: [place] 09/13/17 SDR    Culture (A)  Final    STREPTOCOCCUS PNEUMONIAE SUSCEPTIBILITIES TO FOLLOW Performed at [facility], [place] [address], [place], [place] [address]    Report Status PENDING  Incomplete  Blood Culture ID Panel (Reflexed)     Status: Abnormal   Collection Time: 09/12/17  5:49 PM  Result Value Ref Range Status   Enterococcus species NOT DETECTED NOT DETECTED Final   Listeria monocytogenes NOT DETECTED NOT DETECTED Final   Staphylococcus species NOT DETECTED NOT DETECTED Final   Staphylococcus aureus NOT DETECTED NOT DETECTED Final   Streptococcus species DETECTED (A) NOT DETECTED Final    Comment: CRITICAL RESULT CALLED TO, READ BACK BY AND VERIFIED WITH:  CHRISTINE KATSOUDAS AT 9470 09/13/17 SDR    Streptococcus agalactiae NOT DETECTED NOT DETECTED Final   Streptococcus pneumoniae DETECTED (A) NOT DETECTED Final    Comment: CRITICAL RESULT CALLED TO, READ BACK BY AND VERIFIED WITH: CHRISTINE KATSOUDAS AT 9628 09/13/17 SDR    Streptococcus pyogenes NOT DETECTED NOT DETECTED Final   Acinetobacter baumannii NOT DETECTED NOT DETECTED Final   Enterobacteriaceae species NOT DETECTED NOT DETECTED Final   Enterobacter cloacae complex NOT DETECTED NOT DETECTED Final   Escherichia coli NOT DETECTED NOT DETECTED Final   Klebsiella oxytoca NOT DETECTED NOT DETECTED Final   Klebsiella pneumoniae NOT DETECTED NOT DETECTED Final   Proteus species NOT DETECTED NOT DETECTED Final   Serratia marcescens NOT DETECTED NOT DETECTED Final   Haemophilus influenzae NOT DETECTED NOT DETECTED Final   Neisseria meningitidis NOT DETECTED NOT DETECTED Final   Pseudomonas aeruginosa NOT DETECTED NOT DETECTED Final   Candida albicans NOT DETECTED NOT DETECTED Final   Candida  glabrata NOT DETECTED NOT DETECTED Final   Candida krusei NOT DETECTED NOT DETECTED Final   Candida parapsilosis NOT DETECTED NOT DETECTED Final   Candida tropicalis NOT DETECTED NOT DETECTED Final    Comment: Performed at [facility] [address], [place],  [address]     Studies: Dg Chest 2 View  Result Date: 09/12/2017 CLINICAL DATA:  Shortness of breath. EXAM: CHEST  2 VIEW COMPARISON:  Radiograph of July 21, 2016. FINDINGS: The heart size and mediastinal contours are within normal limits. Atherosclerosis of thoracic aorta is noted. No pneumothorax is noted. Right upper and lower lobe airspace opacities are noted concerning for pneumonia. No significant pleural effusion is noted. The visualized skeletal structures are unremarkable. IMPRESSION: Right upper lobe and lower lobe pneumonia. Aortic atherosclerosis. Followup PA and lateral chest X-ray is recommended in 3-4  weeks following trial of antibiotic therapy to ensure resolution and exclude underlying malignancy. Electronically Signed   By: Marijo Conception, M.D.   On: 09/12/2017 17:18   Ct Head Wo Contrast  Result Date: 09/12/2017 CLINICAL DATA:  82 y/o F; unsteady gait, jumbled speech, shortness of breath. History of CVA. EXAM: CT HEAD WITHOUT CONTRAST TECHNIQUE: Contiguous axial images were obtained from the base of the skull through the vertex without intravenous contrast. COMPARISON:  03/28/2011 CT head. FINDINGS: Brain: No evidence of acute infarction, hemorrhage, hydrocephalus, extra-axial collection or mass lesion/mass effect. Stable chronic infarcts in the bilateral superior cerebellar hemispheres and left genu of the corpus callosum. Stable chronic microvascular ischemic changes and parenchymal volume loss of the brain. Interval large chronic left posterior MCA distribution infarction. Ex vacuo dilatation of left lateral ventricle. Vascular: Calcific atherosclerosis of carotid siphons. No hyperdense vessel. Skull:  Normal. Negative for fracture or focal lesion. Sinuses/Orbits: No acute finding. Other: None. IMPRESSION: 1. No acute intracranial abnormality identified. 2. Interval large left posterior MCA distribution chronic infarction. 3. Stable chronic infarctions of cerebellum and left genu of corpus callosum, chronic microvascular ischemic changes, and parenchymal volume loss of the brain. Electronically Signed   By: Kristine Garbe M.D.   On: 09/12/2017 18:03    Scheduled Meds: . amLODipine  5 mg Oral Daily  . apixaban  5 mg Oral BID  . azithromycin  250 mg Oral Daily  . budesonide (PULMICORT) nebulizer solution  0.5 mg Nebulization BID  . calcium-vitamin D  2 tablet Oral Daily  . clonazePAM  0.5 mg Oral QHS  . docusate sodium  200 mg Oral QHS  . famotidine  20 mg Oral Q24H  . gabapentin  100 mg Oral BID  . ipratropium-albuterol  3 mL Nebulization TID  . loratadine  10 mg Oral Daily  . methylPREDNISolone (SOLU-MEDROL) injection  40 mg Intravenous Daily  . PARoxetine  10 mg Oral Daily  . PARoxetine  20 mg Oral QHS  . tiotropium  18 mcg Inhalation Daily   Continuous Infusions: . cefTRIAXone (ROCEPHIN)  IV Stopped (09/14/17 1300)    Assessment/Plan:  1. Sepsis with Streptococcus pneumoniae.  Pneumonia right upper lobe and right lower lobe, tachycardia and leukocytosis on admission.  Patient on Rocephin and Zithromax and feeling better.  I want to have the sensitivities prior to disposition.   2. COPD exacerbation on Solu-Medrol 40 mg daily, budesonide and DuoNeb nebulizer solution. 3. History of CVA on Eliquis 4. Hyponatremia improved with IV fluid hydration. 5. Acute kidney injury on chronic kidney disease stage III.  Can stop IV fluids at this time. 6. Essential hypertension on Norvasc 7. Borderline elevated troponin secondary to demand ischemia from sepsis  Code Status:     Code Status Orders  (From admission, onward)        Start     Ordered   09/12/17 1810  Do not  attempt resuscitation (DNR)  Continuous    Question Answer Comment  In the event of cardiac or respiratory ARREST Do not call a "code blue"   In the event of cardiac or respiratory ARREST Do not perform Intubation, CPR, defibrillation or ACLS   In the event of cardiac or respiratory ARREST Use medication by any route, position, wound care, and other measures to relive pain and suffering. May use oxygen, suction and manual treatment of airway obstruction as needed for comfort.   Comments nurse may pronounce      09/12/17 1809    Code  Status History    Date Active Date Inactive Code Status Order ID Comments User Context   08/17/2016 21:54 08/22/2016 21:51 DNR [id number]  Karmen Bongo, MD Inpatient   07/19/2016 21:14 07/25/2016 19:23 Full Code [id number]  Nicholes Mango, MD Inpatient   07/19/2016 17:02 07/19/2016 21:14 Full Code [id number]  Nicholes Mango, MD ED    Advance Directive Documentation     Most Recent Value  Type of Advance Directive  Out of facility DNR (pink MOST or yellow form), Healthcare Power of Attorney, Living will  Pre-existing out of facility DNR order (yellow form or pink MOST form)  No data  "MOST" Form in Place?  No data     Family Communication: Daughter at the bedside Disposition Plan: Potential disposition tomorrow if I have sensitivities back on the cultures  Antibiotics:  Rocephin  Zithromax  Time spent: 28 minutes  [place]

## 2017-09-14 NOTE — Progress Notes (Signed)
Physical Therapy Treatment Patient Details Name: Stephanie [name] MRN: [id number] DOB: 29-Jan-1936 Today's Date: 09/14/2017    History of Present Illness Pt admitted for sepsis and pneumonia. Pt with complaints of cough and wheezing. History includes COPD, depression, CAD, and CVA 1 year ago. Residual severe dysarthia, difficulty obtaining baseline due to poor historian.     PT Comments    Pt presents with mild deficits in strength, transfers, mobility, gait, and activity tolerance and is progressing well towards goals.  Daughter Butch Penny contacted for updated history including:  Pt lives alone in a 2-story apt with a level entry and 10 steps to 2nd story with narrow B hand rails.  Pt's bed and bathrooms are on the 2nd level.  Pt Ind with amb without AD community distances and Ind with all ADLs except for daughters pre-cook meals for pt and she then uses the microwave.  One fall in the last year when pt was reaching down low to pick something up and dropped to her knees without injury.  Pt owns no AD's.  This session pt was Ind with bed mobility tasks but remains impulsive during session with multiple verbal cues at times to follow commands.  Pt steady with transfers and amb without AD.  Pt's baseline SpO2 93% on room air with HR 98 bpm.  After amb 60' pt's SpO2 dropped to 89% with HR 100 bpm.  After amb 100' SpO2 again dropped to 89% with HR 106 bpm with min SOB.  Both times pt's SpO2 returned to 92-93% quickly upon returning to sitting.  Pt will benefit from HHPT services upon discharge to safely address above deficits for decreased caregiver assistance and eventual return to PLOF.     Follow Up Recommendations  Home health PT;Supervision/Assistance - 24 hour     Equipment Recommendations       Recommendations for Other Services       Precautions / Restrictions Precautions Precautions: Fall Restrictions Weight Bearing Restrictions: No    Mobility  Bed Mobility Overal bed mobility:  Independent             General bed mobility comments: Pt remains impulsive and needs cues to wait for therapist prior to performing transfer.   Transfers Overall transfer level: Independent Equipment used: None             General transfer comment: Good control and stability during transfers  Ambulation/Gait Ambulation/Gait assistance: Supervision Ambulation Distance (Feet): 100 Feet Assistive device: None Gait Pattern/deviations: Step-through pattern;Decreased stride length   Gait velocity interpretation: Below normal speed for age/gender General Gait Details: Good stability during amb without AD.  Min SOB after gait with SpO2 dropping to a low of 89% on room air and HR 102 bpm, nursing notified.   Stairs            Wheelchair Mobility    Modified Rankin (Stroke Patients Only)       Balance Overall balance assessment: Modified Independent                                          Cognition Arousal/Alertness: Awake/alert Behavior During Therapy: Impulsive Overall Cognitive Status: No family/caregiver present to determine baseline cognitive functioning  Exercises Total Joint Exercises Ankle Circles/Pumps: AROM;Both;10 reps Heel Slides: AROM;Both;10 reps Hip ABduction/ADduction: AROM;Both;10 reps Straight Leg Raises: AROM;Both;10 reps Long Arc Quad: AROM;Both;10 reps Knee Flexion: AROM;Both;10 reps    General Comments        Pertinent Vitals/Pain Pain Assessment: No/denies pain    Home Living                      Prior Function            PT Goals (current goals can now be found in the care plan section) Progress towards PT goals: Progressing toward goals    Frequency    Min 2X/week      PT Plan Current plan remains appropriate    Co-evaluation              AM-PAC PT "6 Clicks" Daily Activity  Outcome Measure                   End  of Session Equipment Utilized During Treatment: Gait belt Activity Tolerance: Patient tolerated treatment well Patient left: in bed;with bed alarm set;with call bell/phone within reach Nurse Communication: Mobility status;Other (comment)(SpO2 after amb) PT Visit Diagnosis: Unsteadiness on feet (R26.81);Muscle weakness (generalized) (M62.81);Difficulty in walking, not elsewhere classified (R26.2)     Time: 1020-1045 PT Time Calculation (min) (ACUTE ONLY): 25 min  Charges:  $Gait Training: 8-22 mins $Therapeutic Exercise: 8-22 mins                    G Codes:       DRoyetta Asal PT, DPT 09/14/17, 11:51 AM

## 2017-09-14 NOTE — Progress Notes (Signed)
Pharmacy Antibiotic Note  Stephanie [name] is a 82 y.o. female admitted on 09/12/2017 with strep pneumo bacteremia.  Pharmacy has been consulted for ceftriaxone dosing.  This is day #2 of ceftriaxone. Patient is also receiving azithromycin 250 mg PO daily for PNA.  Plan: Continue ceftriaxone 2 g IV q24h  Height: 5\' 2"  (157.5 cm) Weight: 121 lb (54.9 kg) IBW/kg (Calculated) : 50.1  Temp (24hrs), Avg:98.1 F (36.7 C), Min:97.5 F (36.4 C), Max:98.6 F (37 C)  Recent Labs  Lab 09/12/17 1630 09/12/17 1744 09/12/17 2018 09/13/17 0545 09/13/17 1108 09/13/17 1333  WBC 21.1*  --   --  13.2*  --   --   CREATININE 1.70*  --   --  1.23*  --   --   LATICACIDVEN  --  1.9 2.7*  --  2.2* 1.1    Estimated Creatinine Clearance: 29.3 mL/min (A) (by C-G formula based on SCr of 1.23 mg/dL (H)).    Allergies  Allergen Reactions  . Duloxetine Hcl     unknown  . Influenza Vaccines     unknown  . Venlafaxine     GI distress   Antimicrobials this admission: Ceftriaxone 2 g IV daily  2/6 >>  azithromycin 2/5 >>   Dose adjustments this admission:  Microbiology results: 2/5 BCx: Streptococcus pneumoniae  Thank you for allowing pharmacy to be a part of this patient's care.  Lenis Noon, PharmD, BCPS Clinical Pharmacist 09/14/2017 8:23 AM

## 2017-09-14 NOTE — Care Management Important Message (Signed)
Important Message  Patient Details  Name: Stephanie [name] MRN: [id number] Date of Birth: 02/11/1936   Medicare Important Message Given:  Yes    Shelbie Ammons, RN 09/14/2017, 6:36 AM

## 2017-09-14 NOTE — Progress Notes (Signed)
Carelink Summary Report / Loop Recorder 

## 2017-09-14 NOTE — Plan of Care (Signed)
  Progressing Activity: Ability to tolerate increased activity will improve 09/14/2017 0411 - Progressing by Denice Bors, RN Clinical Measurements: Ability to maintain a body temperature in the normal range will improve 09/14/2017 0411 - Progressing by Denice Bors, RN Respiratory: Ability to maintain adequate ventilation will improve 09/14/2017 0411 - Progressing by Denice Bors, RN Health Behavior/Discharge Planning: Ability to manage health-related needs will improve 09/14/2017 0411 - Progressing by Denice Bors, RN   Adequate for Discharge Clinical Measurements: Ability to maintain clinical measurements within normal limits will improve 09/14/2017 0411 - Adequate for Discharge by Denice Bors, RN Will remain free from infection 09/14/2017 0411 - Adequate for Discharge by Denice Bors, RN   Completed/Met Education: Knowledge of General Education information will improve 09/14/2017 0411 - Completed/Met by Denice Bors, RN

## 2017-09-15 ENCOUNTER — Other Ambulatory Visit: Payer: Self-pay

## 2017-09-15 ENCOUNTER — Emergency Department: Payer: PPO

## 2017-09-15 ENCOUNTER — Inpatient Hospital Stay (HOSPITAL_COMMUNITY)
Admission: EM | Admit: 2017-09-15 | Discharge: 2017-09-17 | Disposition: A | Discharge status: Home Health | Payer: PPO | Source: Home / Self Care | Attending: Internal Medicine | Admitting: Internal Medicine

## 2017-09-15 ENCOUNTER — Encounter: Payer: Self-pay | Admitting: Emergency Medicine

## 2017-09-15 DIAGNOSIS — F32A Depression, unspecified: Secondary | ICD-10-CM | POA: Diagnosis present

## 2017-09-15 DIAGNOSIS — I2510 Atherosclerotic heart disease of native coronary artery without angina pectoris: Secondary | ICD-10-CM | POA: Diagnosis present

## 2017-09-15 DIAGNOSIS — J45901 Unspecified asthma with (acute) exacerbation: Secondary | ICD-10-CM

## 2017-09-15 DIAGNOSIS — F329 Major depressive disorder, single episode, unspecified: Secondary | ICD-10-CM | POA: Diagnosis present

## 2017-09-15 DIAGNOSIS — I10 Essential (primary) hypertension: Secondary | ICD-10-CM | POA: Diagnosis present

## 2017-09-15 DIAGNOSIS — J189 Pneumonia, unspecified organism: Secondary | ICD-10-CM | POA: Diagnosis present

## 2017-09-15 DIAGNOSIS — R7989 Other specified abnormal findings of blood chemistry: Secondary | ICD-10-CM

## 2017-09-15 DIAGNOSIS — F419 Anxiety disorder, unspecified: Secondary | ICD-10-CM

## 2017-09-15 DIAGNOSIS — R4701 Aphasia: Secondary | ICD-10-CM | POA: Diagnosis present

## 2017-09-15 DIAGNOSIS — J441 Chronic obstructive pulmonary disease with (acute) exacerbation: Secondary | ICD-10-CM | POA: Diagnosis present

## 2017-09-15 DIAGNOSIS — R778 Other specified abnormalities of plasma proteins: Secondary | ICD-10-CM | POA: Diagnosis present

## 2017-09-15 LAB — COMPREHENSIVE METABOLIC PANEL
ALT: 23 U/L (ref 14–54)
ANION GAP: 12 (ref 5–15)
AST: 28 U/L (ref 15–41)
Albumin: 3.2 g/dL — ABNORMAL LOW (ref 3.5–5.0)
Alkaline Phosphatase: 73 U/L (ref 38–126)
BUN: 20 mg/dL (ref 6–20)
CALCIUM: 9.7 mg/dL (ref 8.9–10.3)
CHLORIDE: 104 mmol/L (ref 101–111)
CO2: 23 mmol/L (ref 22–32)
CREATININE: 0.98 mg/dL (ref 0.44–1.00)
GFR, EST NON AFRICAN AMERICAN: 53 mL/min — AB (ref >60)
Glucose, Bld: 104 mg/dL — ABNORMAL HIGH (ref 65–99)
Potassium: 3.9 mmol/L (ref 3.5–5.1)
Sodium: 139 mmol/L (ref 135–145)
Total Bilirubin: 0.4 mg/dL (ref 0.3–1.2)
Total Protein: 6.9 g/dL (ref 6.5–8.1)

## 2017-09-15 LAB — CREATININE, SERUM
CREATININE: 0.99 mg/dL (ref 0.44–1.00)
GFR calc Af Amer: >60 mL/min (ref >60)
GFR, EST NON AFRICAN AMERICAN: 53 mL/min — AB (ref >60)

## 2017-09-15 LAB — CULTURE, BLOOD (ROUTINE X 2)
SPECIAL REQUESTS: ADEQUATE
SPECIAL REQUESTS: ADEQUATE

## 2017-09-15 LAB — CBC
HCT: 29.4 % — ABNORMAL LOW (ref 35.0–47.0)
HCT: 33.9 % — ABNORMAL LOW (ref 35.0–47.0)
HEMOGLOBIN: 11.4 g/dL — AB (ref 12.0–16.0)
Hemoglobin: 9.7 g/dL — ABNORMAL LOW (ref 12.0–16.0)
MCH: 31.2 pg (ref 26.0–34.0)
MCH: 31.7 pg (ref 26.0–34.0)
MCHC: 33 g/dL (ref 32.0–36.0)
MCHC: 33.7 g/dL (ref 32.0–36.0)
MCV: 94.5 fL (ref 80.0–100.0)
MCV: 94.1 fL (ref 80.0–100.0)
PLATELETS: 264 10*3/uL (ref 150–440)
PLATELETS: 276 10*3/uL (ref 150–440)
RBC: 3.11 MIL/uL — AB (ref 3.80–5.20)
RBC: 3.6 MIL/uL — AB (ref 3.80–5.20)
RDW: 14.6 % — ABNORMAL HIGH (ref 11.5–14.5)
RDW: 14.9 % — ABNORMAL HIGH (ref 11.5–14.5)
WBC: 9.3 10*3/uL (ref 3.6–11.0)
WBC: 8.3 10*3/uL (ref 3.6–11.0)

## 2017-09-15 LAB — TROPONIN I: TROPONIN I: 0.18 ng/mL — AB (ref <0.03)

## 2017-09-15 MED ORDER — APIXABAN 5 MG PO TABS
5.0000 mg | ORAL_TABLET | Freq: Two times a day (BID) | ORAL | Status: DC
  Administered 2017-09-15 – 2017-09-17 (×4): 5 mg via ORAL
  Filled 2017-09-15 (×4): qty 1

## 2017-09-15 MED ORDER — LEVOFLOXACIN IN D5W 750 MG/150ML IV SOLN
750.0000 mg | Freq: Once | INTRAVENOUS | Status: AC
  Administered 2017-09-15: 750 mg via INTRAVENOUS
  Filled 2017-09-15: qty 150

## 2017-09-15 MED ORDER — IPRATROPIUM-ALBUTEROL 0.5-2.5 (3) MG/3ML IN SOLN
RESPIRATORY_TRACT | Status: AC
  Filled 2017-09-15: qty 3

## 2017-09-15 MED ORDER — MOMETASONE FURO-FORMOTEROL FUM 200-5 MCG/ACT IN AERO
2.0000 | INHALATION_SPRAY | Freq: Two times a day (BID) | RESPIRATORY_TRACT | Status: DC
  Administered 2017-09-15 – 2017-09-17 (×4): 2 via RESPIRATORY_TRACT
  Filled 2017-09-15: qty 8.8

## 2017-09-15 MED ORDER — ONDANSETRON HCL 4 MG/2ML IJ SOLN: 4.0000 mg | Freq: Four times a day (QID) | INTRAMUSCULAR | Status: DC | PRN

## 2017-09-15 MED ORDER — AZITHROMYCIN 250 MG PO TABS: 250.0000 mg | ORAL_TABLET | Freq: Every day | ORAL | 0 refills | Status: DC

## 2017-09-15 MED ORDER — FAMOTIDINE 20 MG PO TABS
10.0000 mg | ORAL_TABLET | Freq: Two times a day (BID) | ORAL | Status: DC
  Administered 2017-09-15 – 2017-09-17 (×4): 10 mg via ORAL
  Filled 2017-09-15 (×4): qty 1

## 2017-09-15 MED ORDER — AMLODIPINE BESYLATE 5 MG PO TABS
5.0000 mg | ORAL_TABLET | Freq: Every day | ORAL | Status: DC
  Administered 2017-09-16 – 2017-09-17 (×2): 5 mg via ORAL
  Filled 2017-09-15 (×2): qty 1

## 2017-09-15 MED ORDER — IPRATROPIUM-ALBUTEROL 0.5-2.5 (3) MG/3ML IN SOLN
3.0000 mL | Freq: Once | RESPIRATORY_TRACT | Status: AC
  Administered 2017-09-15: 3 mL via RESPIRATORY_TRACT

## 2017-09-15 MED ORDER — PREDNISONE 10 MG PO TABS: 5.0000 mg | ORAL_TABLET | Freq: Every day | ORAL | Status: DC

## 2017-09-15 MED ORDER — GABAPENTIN 100 MG PO CAPS
100.0000 mg | ORAL_CAPSULE | Freq: Three times a day (TID) | ORAL | Status: DC
  Administered 2017-09-15 – 2017-09-17 (×6): 100 mg via ORAL
  Filled 2017-09-15 (×6): qty 1

## 2017-09-15 MED ORDER — HALOPERIDOL LACTATE 5 MG/ML IJ SOLN
1.0000 mg | Freq: Once | INTRAMUSCULAR | Status: AC
  Administered 2017-09-16: 1 mg via INTRAVENOUS
  Filled 2017-09-15: qty 1

## 2017-09-15 MED ORDER — PREDNISONE 20 MG PO TABS
20.0000 mg | ORAL_TABLET | Freq: Every day | ORAL | Status: DC
  Administered 2017-09-17: 20 mg via ORAL

## 2017-09-15 MED ORDER — ONDANSETRON HCL 4 MG PO TABS: 4.0000 mg | ORAL_TABLET | Freq: Four times a day (QID) | ORAL | Status: DC | PRN

## 2017-09-15 MED ORDER — PAROXETINE HCL 10 MG PO TABS: 10.0000 mg | ORAL_TABLET | Freq: Every day | ORAL | Status: DC

## 2017-09-15 MED ORDER — CEFUROXIME AXETIL 500 MG PO TABS: 500.0000 mg | ORAL_TABLET | Freq: Two times a day (BID) | ORAL | 0 refills | Status: DC

## 2017-09-15 MED ORDER — ACETAMINOPHEN 325 MG PO TABS
650.0000 mg | ORAL_TABLET | Freq: Four times a day (QID) | ORAL | Status: DC | PRN
  Administered 2017-09-15: 650 mg via ORAL
  Filled 2017-09-15: qty 2

## 2017-09-15 MED ORDER — GUAIFENESIN-DM 100-10 MG/5ML PO SYRP: 5.0000 mL | ORAL_SOLUTION | ORAL | Status: DC | PRN

## 2017-09-15 MED ORDER — TIOTROPIUM BROMIDE MONOHYDRATE 18 MCG IN CAPS
18.0000 ug | ORAL_CAPSULE | Freq: Every day | RESPIRATORY_TRACT | Status: DC
  Administered 2017-09-16 – 2017-09-17 (×2): 18 ug via RESPIRATORY_TRACT
  Filled 2017-09-15: qty 5

## 2017-09-15 MED ORDER — DIPHENHYDRAMINE HCL 25 MG PO CAPS
25.0000 mg | ORAL_CAPSULE | Freq: Every evening | ORAL | Status: DC | PRN
  Administered 2017-09-16: 25 mg via ORAL
  Administered 2017-09-16: 50 mg via ORAL
  Filled 2017-09-15 (×3): qty 1

## 2017-09-15 MED ORDER — RANITIDINE HCL 75 MG PO TABS: 37.5000 mg | ORAL_TABLET | Freq: Two times a day (BID) | ORAL | 2 refills | Status: DC

## 2017-09-15 MED ORDER — PREDNISONE 20 MG PO TABS
30.0000 mg | ORAL_TABLET | Freq: Every day | ORAL | Status: DC
  Administered 2017-09-16: 10:00:00 30 mg via ORAL
  Filled 2017-09-15 (×2): qty 1

## 2017-09-15 MED ORDER — HALOPERIDOL LACTATE 5 MG/ML IJ SOLN
2.0000 mg | Freq: Once | INTRAMUSCULAR | Status: AC
  Administered 2017-09-15: 03:00:00 2 mg via INTRAVENOUS
  Filled 2017-09-15: qty 1

## 2017-09-15 MED ORDER — ASPIRIN 81 MG PO CHEW
324.0000 mg | CHEWABLE_TABLET | Freq: Once | ORAL | Status: AC
  Administered 2017-09-15: 324 mg via ORAL
  Filled 2017-09-15: qty 4

## 2017-09-15 MED ORDER — PREDNISONE 10 MG PO TABS: 10.0000 mg | ORAL_TABLET | Freq: Every day | ORAL | Status: DC

## 2017-09-15 MED ORDER — LEVOFLOXACIN IN D5W 750 MG/150ML IV SOLN
750.0000 mg | INTRAVENOUS | Status: DC
  Filled 2017-09-15: qty 150

## 2017-09-15 MED ORDER — CLONAZEPAM 0.5 MG PO TABS
0.5000 mg | ORAL_TABLET | Freq: Every day | ORAL | Status: DC
  Administered 2017-09-15 – 2017-09-16 (×2): 0.5 mg via ORAL
  Filled 2017-09-15 (×2): qty 1

## 2017-09-15 MED ORDER — IPRATROPIUM-ALBUTEROL 0.5-2.5 (3) MG/3ML IN SOLN
3.0000 mL | RESPIRATORY_TRACT | Status: DC | PRN
  Administered 2017-09-16 – 2017-09-17 (×3): 3 mL via RESPIRATORY_TRACT
  Filled 2017-09-15 (×3): qty 3

## 2017-09-15 MED ORDER — PAROXETINE HCL 20 MG PO TABS
20.0000 mg | ORAL_TABLET | Freq: Every day | ORAL | Status: DC
  Administered 2017-09-15 – 2017-09-16 (×2): 20 mg via ORAL
  Filled 2017-09-15 (×3): qty 1

## 2017-09-15 MED ORDER — DEXTROSE 5 % IV SOLN
2.0000 g | Freq: Once | INTRAVENOUS | Status: AC
  Administered 2017-09-15: 11:00:00 2 g via INTRAVENOUS
  Filled 2017-09-15: qty 20

## 2017-09-15 MED ORDER — ACETAMINOPHEN 650 MG RE SUPP: 650.0000 mg | Freq: Four times a day (QID) | RECTAL | Status: DC | PRN

## 2017-09-15 MED ORDER — CEFUROXIME AXETIL 500 MG PO TABS: 500.0000 mg | ORAL_TABLET | Freq: Two times a day (BID) | ORAL | Status: DC

## 2017-09-15 MED ORDER — PAROXETINE HCL 10 MG PO TABS
10.0000 mg | ORAL_TABLET | Freq: Every day | ORAL | Status: DC
  Administered 2017-09-16 – 2017-09-17 (×2): 10 mg via ORAL
  Filled 2017-09-15 (×2): qty 1

## 2017-09-15 NOTE — ED Notes (Signed)
Pt placed on oxygen at 2lpm via [place] for ra pox of 90% after ambulation.

## 2017-09-15 NOTE — ED Notes (Signed)
Attempt to call report, no answer at nurse's station.

## 2017-09-15 NOTE — Progress Notes (Signed)
Patient discharged with family, discharge instructions and prescriptions reviewed with patient, states understanding. IV catheter intact upon removal. Patient with no complaints.

## 2017-09-15 NOTE — ED Notes (Signed)
Date and time results received: 09/15/17 1852 (use smartphrase ".now" to insert current time)  Test: Troponin Critical Value: 0.18  Name of Provider Notified: Dr Nelva Bush  Orders Received? Or Actions Taken?: Notified

## 2017-09-15 NOTE — ED Notes (Signed)
Report from [place], pt assisted with bedpan. md wishes to obtain an ambulatory sat on pt.

## 2017-09-15 NOTE — ED Notes (Signed)
Pt and family updated on admission process. Pt and family verbalize understanding. Call bell at right side. Pt readjusted in bed for comfort.

## 2017-09-15 NOTE — ED Provider Notes (Signed)
[facility] Emergency Department Provider Note  Time seen: 7:03 PM  I have reviewed the triage vital signs and the nursing notes.   HISTORY  Chief Complaint Shortness of Breath    HPI Stephanie [name] is a 82 y.o. female with a past medical history of COPD, CVA with difficulty speaking, presents to the emergency department for shortness of breath.  According to family the patient was just discharged today from the hospital where she was admitted for COPD and pneumonia.  Patient has a flight of stairs in her home that leads to her bathroom and bedroom.  Went up the flight of steps and became extremely short of breath, cannot catch her breath tried her inhaler but still could not catch her breath, called EMS to bring her to the emergency department.  Very difficult to get a history from the patient due to her prior CVA and difficulty speaking with word finding difficulty, word salad, family states this is baseline.  Patient follows commands well, appears calm and comfortable right now.  Room air saturation of 94%.  Moderate tachypnea.  Past Medical History:  Diagnosis Date  . Arthritis   . Complication of anesthesia   . COPD (chronic obstructive pulmonary disease) ([place])   . Coronary artery disease 2013   stent  . Depression   . Edema   . Pneumonia    IN PAST  . PONV (postoperative nausea and vomiting)   . Stroke [facility])     Patient Active Problem List   Diagnosis Date Noted  . Sepsis ([place]) 09/12/2017  . History of stroke 09/06/2017  . Essential hypertension 04/14/2017  . PFO (patent foramen ovale)   . DVT (deep venous thrombosis) ([place])   . Cerebrovascular accident (CVA) due to embolism of left middle cerebral artery ([place])   . Expressive aphasia   . Elevated troponin 08/18/2016  . Mild malnutrition ([place]) 08/18/2016  . Acute CVA (cerebrovascular accident) ([place]) 08/17/2016  . Acute exacerbation of chronic obstructive pulmonary disease (COPD) ([place])  07/19/2016  . Depression 05/03/2016  . Mild dementia 04/19/2016  . COPD, moderate ([place]) 03/05/2015  . Anxiety and depression 03/04/2015  . Arteriosclerosis of coronary artery 03/04/2015  . CAFL (chronic airflow limitation) ([place]) 03/04/2015  . Breathlessness on exertion 03/04/2015  . HLD (hyperlipidemia) 03/04/2015  . Osteopenia 03/04/2015  . Peptic ulcer 03/04/2015  . Amnesia 03/20/2014  . Disordered sleep 03/20/2014  . Allergy to environmental factors 11/14/2013  . Gastroduodenal ulcer 11/14/2013  . GI bleed 11/14/2013    Past Surgical History:  Procedure Laterality Date  . BALLOON ANGIOPLASTY, ARTERY  05/17/2009  . CATARACT EXTRACTION W/PHACO Left 05/24/2016   Procedure: CATARACT EXTRACTION PHACO AND INTRAOCULAR LENS PLACEMENT (IOC);  Surgeon: Birder Robson, MD;  Location: [facility] ORS;  Service: Ophthalmology;  Laterality: Left;  Lot# [id number] H [place]:   00:52.3 AP%:   27.3 CDE:  14.26   . CATARACT EXTRACTION W/PHACO Right 06/14/2016   Procedure: CATARACT EXTRACTION PHACO AND INTRAOCULAR LENS PLACEMENT (IOC);  Surgeon: Birder Robson, MD;  Location: [facility] ORS;  Service: Ophthalmology;  Laterality: Right;  Lot# [id number] H [place]: 01:13.4 AP%: 24.4 CDE: 17.85  . CORONARY ANGIOPLASTY     STENT  . EP IMPLANTABLE DEVICE N/A 08/19/2016   Procedure: Loop Recorder Insertion;  Surgeon: Will Meredith Leeds, MD;  Location: [place] [facility];  Service: Cardiovascular;  Laterality: N/A;    Prior to Admission medications   Medication Sig Start Date End Date Taking? Authorizing Provider  acetaminophen (TYLENOL) 500 MG  tablet Take 1,000 mg by mouth 2 (two) times daily as needed.    Yes [provider]  albuterol (VENTOLIN HFA) 108 (90 Base) MCG/ACT inhaler Inhale 2 puffs into the lungs every 6 (six) hours as needed. 12/05/16  Yes Chrismon, Vickki Muff, PA  amLODipine (NORVASC) 5 MG tablet Take 5 mg by mouth daily.   Yes [provider]  azithromycin (ZITHROMAX) 250 MG tablet Take 1  tablet (250 mg total) by mouth daily. 09/15/17  Yes Wieting, Richard, MD  Calcium Carb-Cholecalciferol (CALCIUM 600+D) 600-800 MG-UNIT TABS Take 2 tablets by mouth daily.   Yes [provider]  cefUROXime (CEFTIN) 500 MG tablet Take 1 tablet (500 mg total) by mouth 2 (two) times daily with a meal. 09/16/17  Yes Wieting, Richard, MD  cetirizine (ZYRTEC) 10 MG tablet Take 10 mg by mouth daily.   Yes [provider]  clonazePAM (KLONOPIN) 0.5 MG tablet TAKE 1 TABLET BY MOUTH TWICE DAILY AS NEEDED FOR ANXIETY Patient taking differently: TAKE 1 TABLET (0.5MG ) BY MOUTH AT BEDTIME 12/23/16  Yes Birdie Sons, MD  docusate sodium (COLACE) 250 MG capsule Take 250 mg by mouth at bedtime.    Yes [provider]  ELIQUIS 5 MG TABS tablet TAKE ONE (1) TABLET BY MOUTH TWO (2) TIMES DAILY 04/11/17  Yes Chrismon, Vickki Muff, PA  gabapentin (NEURONTIN) 100 MG capsule TAKE 1 CAPSULE BY MOUTH THREE TIMES A DAY Patient taking differently: TAKE 1 CAPSULE (100MG ) BY MOUTH TWICE DAILY 05/18/17  Yes Chrismon, Vickki Muff, PA  PARoxetine (PAXIL) 10 MG tablet Take 1-2 tablets (10-20 mg total) by mouth daily. Take 1 tablet (10MG ) in the morning and 2 tablets (20MG ) at bedtime 09/15/17  Yes Wieting, Richard, MD  ranitidine (ZANTAC 75) 75 MG tablet Take 0.5 tablets (37.5 mg total) by mouth 2 (two) times daily. 09/15/17 12/14/17 Yes Loletha Grayer, MD  Spacer/Aero Chamber Mouthpiece MISC Use spacer on inhalers for better administration. 10/06/16  Yes Chrismon, Vickki Muff, PA  SPIRIVA HANDIHALER 18 MCG inhalation capsule INHALE CONTENTS OF ONE CAPSULE ONCE A DAY AS DIRECTED 02/18/17  Yes Birdie Sons, MD  SYMBICORT 160-4.5 MCG/ACT inhaler INHALE TWO PUFFS INTO THE LUNGS TWICE A DAY 02/20/17  Yes Chrismon, Vickki Muff, PA    Allergies  Allergen Reactions  . Duloxetine Hcl     unknown  . Influenza Vaccines     unknown  . Venlafaxine     GI distress    Family History  Problem Relation Age of Onset  .  Hypertension Mother   . Diabetes Mother   . Pancreatic cancer Mother 70  . Cancer Mother   . Suicidality Father 60  . Hypertension Father   . Alcohol abuse Father   . Hypertension Sister   . Heart disease Sister   . Kidney disease Sister   . Varicose Veins Sister   . Parkinson's disease Sister     Social History Social History   Tobacco Use  . Smoking status: Former Smoker    Last attempt to quit: 08/07/2001    Years since quitting: 16.1  . Smokeless tobacco: Never Used  . Tobacco comment: QUIT IN 2002  Substance Use Topics  . Alcohol use: No    Alcohol/week: 0.0 oz  . Drug use: No    Review of Systems Unable to obtain an adequate review of systems due to speech difficulty trouble answering questions.  ____________________________________________   PHYSICAL EXAM:  VITAL SIGNS: ED Triage Vitals  Enc Vitals  Group     BP 09/15/17 1750 (!) 163/77     Pulse Rate 09/15/17 1750 100     Resp 09/15/17 1750 (!) 32     Temp 09/15/17 1750 97.9 F (36.6 C)     Temp Source 09/15/17 1750 Oral     SpO2 09/15/17 1750 94 %     Weight 09/15/17 1739 130 lb 15.3 oz (59.4 kg)     Height 09/15/17 1739 5\' 5"  (1.651 m)     Head Circumference --      Peak Flow --      Pain Score 09/15/17 1739 0     Pain Loc --      Pain Edu? --      Excl. in [place]? --    Constitutional: Alert, no distress, lying in bed calm and comfortable mild tachypnea. Eyes: Normal exam ENT   Head: Normocephalic and atraumatic.   Mouth/Throat: Mucous membranes are moist. Cardiovascular: Normal rate, regular rhythm. No murmur Respiratory: Normal respiratory effort without tachypnea nor retractions. Breath sounds are clear.  No obvious wheeze rales or rhonchi. Gastrointestinal: Soft and nontender. No distention. Musculoskeletal: Nontender with normal range of motion in all extremities. Neurologic:  Normal speech and language. No gross focal neurologic deficits  Skin:  Skin is warm, dry and intact.   Psychiatric: Mood and affect are normal.   ____________________________________________    EKG  EKG reviewed and interpreted by myself  ____________________________________________    RADIOLOGY  Chest x-ray shows no pneumonia as well as new pneumonia.  ____________________________________________   INITIAL IMPRESSION / ASSESSMENT AND PLAN / ED COURSE  Pertinent labs & imaging results that were available during my care of the patient were reviewed by me and considered in my medical decision making (see chart for details).  Patient presents to the emergency department for difficulty breathing.  Differential would include COPD exacerbation, difficulty breathing due to previously diagnosed pneumonia, ACS.  Patient's labs have begun to results showing elevated troponin 0 0.18.  I reviewed the patient's records, troponin upon discharge was largely normal, indicating possible NSTEMI.  We will ambulate the patient to see if she desats.  Patient's labs are resulted with an elevated troponin 0 0.18, discharge troponin was normal.  X-ray shows a new area of pneumonia.  We will start on IV Levaquin.  Given the patient's elevated troponin with new areas of pneumonia we will admit to the hospitalist service for further treatment.  We will dose aspirin therapy.  ____________________________________________   FINAL CLINICAL IMPRESSION(S) / ED DIAGNOSES  Shortness of breath Pneumonia NSTEMI   Harvest Dark, MD 09/15/17 2030

## 2017-09-15 NOTE — ED Notes (Signed)
Pt is acutely confused; with further triage, presents with word salad, unknown if this is patient's baseline.  EDP notified.

## 2017-09-15 NOTE — ED Triage Notes (Signed)
Pt in via [facility] from home; pt discharged today, hospitalized due to PNA.  Pt reports walking up flight of stairs at home, becoming very short of breath on exertion.

## 2017-09-15 NOTE — Progress Notes (Signed)
Pharmacy Antibiotic Note  Stephanie [name] is a 82 y.o. female admitted on 09/15/2017 with CAP.  Pharmacy has been consulted for Levaquin dosing.  Plan: Levaquin 750 mg q 48 hours ordered.  Height: 5\' 5"  (165.1 cm) Weight: 130 lb 15.3 oz (59.4 kg) IBW/kg (Calculated) : 57  Temp (24hrs), Avg:97.9 F (36.6 C), Min:97.7 F (36.5 C), Max:98 F (36.7 C)  Recent Labs  Lab 09/12/17 1630 09/12/17 1744 09/12/17 2018 09/13/17 0545 09/13/17 1108 09/13/17 1333 09/15/17 0327 09/15/17 1742  WBC 21.1*  --   --  13.2*  --   --  9.3 8.3  CREATININE 1.70*  --   --  1.23*  --   --  0.99 0.98  LATICACIDVEN  --  1.9 2.7*  --  2.2* 1.1  --   --     Estimated Creatinine Clearance: 41.9 mL/min (by C-G formula based on SCr of 0.98 mg/dL).    Allergies  Allergen Reactions  . Duloxetine Hcl     unknown  . Influenza Vaccines     unknown  . Venlafaxine     GI distress    Antimicrobials this admission: Ceftriaxone, azithromycin 2/6 >> Levaquin 2/8     >>   Dose adjustments this admission:   Microbiology results: 2/5 BCx: BCID S pneumoniae      2/5 UA: (-) 2/8 CXR: RUL and base infiltrate Thank you for allowing pharmacy to be a part of this patient's care.  Harolyn Cocker S 09/15/2017 10:08 PM

## 2017-09-15 NOTE — ED Notes (Signed)
Pt in xray

## 2017-09-15 NOTE — ED Notes (Signed)
Assisted patient off bed pan and cleaned up.

## 2017-09-15 NOTE — ED Notes (Signed)
Patient transported to 41

## 2017-09-15 NOTE — Care Management Note (Addendum)
Case Management Note  Patient Details  Name: Stephanie [name] MRN: [id number] Date of Birth: 1935/10/07  Subjective/Objective:     Admitted to [facility] with the diagnosis of sepsis. Lives alone. Daughter is Beverlee Nims [phone]). Last seen Dr. Joana Reamer 07/28/17. Prescriptions are [facility] at [facility] in [place]. No home Health. [facility] December 2017, then 2018. No home oxygen. Nebulizer, shower chair, and [employer] system in the home. Takes care of all basic activities of daily living herself, doesn't drive. Last fall was about a week ago, Good appetite. Gained weight.  Daughter will transport               Action/Plan: Physical therapy evaluation completed. Recommending home with home health and supervision. Daughter will stay in the home this afternoon. Chose [facility]. Telephone call to [facility] and faxed to intake.    Expected Discharge Date:  09/15/17               Expected Discharge Plan:     In-House Referral:   yes  Discharge planning Services   yes  Post Acute Care Choice:   yes Choice offered to:   daughters  DME Arranged:    DME Agency:     HH Arranged:    [place] Agency:     Status of Service:     If discussed at [employer], dates discussed:    Additional Comments:  Shelbie Ammons, RN MSN CCM Care Management [phone] 09/15/2017, 9:14 AM

## 2017-09-15 NOTE — ED Notes (Signed)
While ambulating O2 81%

## 2017-09-15 NOTE — Discharge Summary (Signed)
[facility] NAME: Stephanie [name]    MR#:  [id number]  DATE OF BIRTH:  01/30/1936  DATE OF ADMISSION:  09/12/2017 ADMITTING PHYSICIAN: Loletha Grayer, MD  DATE OF DISCHARGE: 09/15/2017 12:01 PM  PRIMARY CARE PHYSICIAN: Chrismon, Vickki Muff, PA    ADMISSION DIAGNOSIS:  Shortness of breath [R06.02] Acute exacerbation of COPD with asthma ([place]) [J44.1, J45.901] Leukocytosis, unspecified type [D72.829] Community acquired pneumonia, unspecified laterality [J18.9]  DISCHARGE DIAGNOSIS:  Active Problems:   Sepsis ([place])   SECONDARY DIAGNOSIS:   Past Medical History:  Diagnosis Date  . Arthritis   . Complication of anesthesia   . COPD (chronic obstructive pulmonary disease) ([place])   . Coronary artery disease 2013   stent  . Depression   . Edema   . Pneumonia    IN PAST  . PONV (postoperative nausea and vomiting)   . Stroke [facility])     HOSPITAL COURSE:   1.  Clinical sepsis with Streptococcus pneumoniae.  Patient had a right upper lobe and right lower lobe pneumonia, tachycardia and leukocytosis on admission.  Patient was given Rocephin and Zithromax.  Patient was feeling better.  This was pansensitive.  Patient was switched over to Ceftin for another 10 days and continued on Zithromax for another 2 days.  Patient's family states that she had a pneumonia vaccination a couple years ago. 2.  COPD exacerbation.  This patient was started on Solu-Medrol 40 mg daily, budesonide and DuoNeb nebulizer solution.  The patient's wheeze was completely gone upon going home.  Steroids stopped because the patient did not sleep last night.  Patient can go back on her usual inhalers. 3.  History of CVA on Eliquis. 4.  Hyponatremia.  Improved with IV fluid hydration. 5.  Acute kidney injury on chronic kidney disease stage III. 6.  Essential hypertension on Norvasc 7.  Borderline elevated troponin secondary to demand ischemia from sepsis 8.  Weakness  physical therapy recommended home health 9.  Confusion because the patient did not sleep last night.  Suspect underlying dementia.  Follow-up with PMD for Mini-Mental status exam once recovered from sepsis and pneumonia. 10.  Family to stay with the patient through the weekend.  Concerned about this patient living by herself   DISCHARGE CONDITIONS:   Satisfactory    DRUG ALLERGIES:   Allergies  Allergen Reactions  . Duloxetine Hcl     unknown  . Influenza Vaccines     unknown  . Venlafaxine     GI distress    DISCHARGE MEDICATIONS:   Allergies as of 09/15/2017      Reactions   Duloxetine Hcl    unknown   Influenza Vaccines    unknown   Venlafaxine    GI distress      Medication List    TAKE these medications   albuterol 108 (90 Base) MCG/ACT inhaler Commonly known as:  VENTOLIN HFA Inhale 2 puffs into the lungs every 6 (six) hours as needed.   amLODipine 5 MG tablet Commonly known as:  NORVASC Take 5 mg by mouth daily.   azithromycin 250 MG tablet Commonly known as:  ZITHROMAX Take 1 tablet (250 mg total) by mouth daily.   CALCIUM 600+D 600-800 MG-UNIT Tabs Generic drug:  Calcium Carb-Cholecalciferol Take 2 tablets by mouth daily.   cefUROXime 500 MG tablet Commonly known as:  CEFTIN Take 1 tablet (500 mg total) by mouth 2 (two) times daily with a meal. Start taking on:  09/16/2017   cetirizine 10 MG tablet Commonly known as:  ZYRTEC Take 10 mg by mouth daily.   clonazePAM 0.5 MG tablet Commonly known as:  KLONOPIN TAKE 1 TABLET BY MOUTH TWICE DAILY AS NEEDED FOR ANXIETY What changed:    how much to take  how to take this  when to take this  additional instructions   docusate sodium 250 MG capsule Commonly known as:  COLACE Take 250 mg by mouth at bedtime.   ELIQUIS 5 MG Tabs tablet Generic drug:  apixaban TAKE ONE (1) TABLET BY MOUTH TWO (2) TIMES DAILY   gabapentin 100 MG capsule Commonly known as:  NEURONTIN TAKE 1 CAPSULE BY MOUTH  THREE TIMES A DAY What changed:  See the new instructions.   PARoxetine 10 MG tablet Commonly known as:  PAXIL Take 1-2 tablets (10-20 mg total) by mouth daily. Take 1 tablet (10MG ) in the morning and 2 tablets (20MG ) at bedtime What changed:    how much to take  when to take this  additional instructions   ranitidine 75 MG tablet Commonly known as:  ZANTAC 75 Take 0.5 tablets (37.5 mg total) by mouth 2 (two) times daily.   Spacer/Aero Chamber [employer] Use spacer on inhalers for better administration.   SPIRIVA HANDIHALER 18 MCG inhalation capsule Generic drug:  tiotropium INHALE CONTENTS OF ONE CAPSULE ONCE A DAY AS DIRECTED   SYMBICORT 160-4.5 MCG/ACT inhaler Generic drug:  budesonide-formoterol INHALE TWO PUFFS INTO THE LUNGS TWICE A DAY   TYLENOL 500 MG tablet Generic drug:  acetaminophen Take 1,000 mg by mouth 2 (two) times daily as needed.        DISCHARGE INSTRUCTIONS:   Follow-up PMD 1 week  If you experience worsening of your admission symptoms, develop shortness of breath, life threatening emergency, suicidal or homicidal thoughts you must seek medical attention immediately by calling 911 or calling your MD immediately  if symptoms less severe.  You Must read complete instructions/literature along with all the possible adverse reactions/side effects for all the Medicines you take and that have been prescribed to you. Take any new Medicines after you have completely understood and accept all the possible adverse reactions/side effects.   Please note  You were cared for by a hospitalist during your hospital stay. If you have any questions about your discharge medications or the care you received while you were in the hospital after you are discharged, you can call the unit and asked to speak with the hospitalist on call if the hospitalist that took care of you is not available. Once you are discharged, your primary care physician will handle any further  medical issues. Please note that NO REFILLS for any discharge medications will be authorized once you are discharged, as it is imperative that you return to your primary care physician (or establish a relationship with a primary care physician if you do not have one) for your aftercare needs so that they can reassess your need for medications and monitor your lab values.    Today   CHIEF COMPLAINT:   Chief Complaint  Patient presents with  . Shortness of Breath    HISTORY OF PRESENT ILLNESS:  Stephanie [name]  is a 82 y.o. female presented with shortness of breath and found to have clinical sepsis and pneumonia   VITAL SIGNS:  Blood pressure (!) 141/71, pulse 79, temperature 98 F (36.7 C), temperature source Oral, resp. rate 16, height 5\' 2"  (1.575 m), weight 54.9 kg (121 lb),  SpO2 95 %.    PHYSICAL EXAMINATION:  GENERAL:  82 y.o.-year-old patient lying in the bed with no acute distress.  EYES: Pupils equal, round, reactive to light and accommodation. No scleral icterus. Extraocular muscles intact.  HEENT: Head atraumatic, normocephalic. Oropharynx and nasopharynx clear.  NECK:  Supple, no jugular venous distention. No thyroid enlargement, no tenderness.  LUNGS: Decreased breath sounds bilaterally, no wheezing, rales,rhonchi or crepitation. No use of accessory muscles of respiration.  CARDIOVASCULAR: S1, S2 normal. No murmurs, rubs, or gallops.  ABDOMEN: Soft, non-tender, non-distended. Bowel sounds present. No organomegaly or mass.  EXTREMITIES: No pedal edema, cyanosis, or clubbing.  NEUROLOGIC: Cranial nerves II through XII are intact. Muscle strength 5/5 in all extremities. Sensation intact. Gait not checked.  PSYCHIATRIC: The patient is alert and answers some questions.  Periods of confusion.  SKIN: No obvious rash, lesion, or ulcer.   DATA REVIEW:   CBC Recent Labs  Lab 09/15/17 0327  WBC 9.3  HGB 9.7*  HCT 29.4*  PLT 264    Chemistries  Recent Labs  Lab  09/13/17 0545 09/15/17 0327  NA 136  --   K 3.5  --   CL 106  --   CO2 22  --   GLUCOSE 107*  --   BUN 21*  --   CREATININE 1.23* 0.99  CALCIUM 8.5*  --     Cardiac Enzymes Recent Labs  Lab 09/12/17 2351  TROPONINI <0.03    Microbiology Results  Results for orders placed or performed during the hospital encounter of 09/12/17  Blood Culture (routine x 2)     Status: Abnormal   Collection Time: 09/12/17  5:44 PM  Result Value Ref Range Status   Specimen Description   Final    BLOOD RIGHT ANTECUBITAL Performed at [facility], [address], [place], [place] [address]    Special Requests   Final    BOTTLES DRAWN AEROBIC AND ANAEROBIC Blood Culture adequate volume Performed at [facility], [address], [place], [place] [address]    Culture  Setup Time   Final    GRAM POSITIVE COCCI ANAEROBIC BOTTLE ONLY CRITICAL VALUE NOTED.  VALUE IS CONSISTENT WITH PREVIOUSLY REPORTED AND CALLED VALUE. Performed at [facility], [address] [place], [place] [address]    Culture (A)  Final    STREPTOCOCCUS PNEUMONIAE SUSCEPTIBILITIES PERFORMED ON PREVIOUS CULTURE WITHIN THE LAST 5 DAYS. Performed at [facility], [place] [address], [place], [place] [address]    Report Status 09/15/2017 FINAL  Final  Blood Culture (routine x 2)     Status: Abnormal   Collection Time: 09/12/17  5:49 PM  Result Value Ref Range Status   Specimen Description   Final    BLOOD LEFT ANTECUBITAL Performed at [facility], [address], [place], [place] [address]    Special Requests   Final    BOTTLES DRAWN AEROBIC AND ANAEROBIC Blood Culture adequate volume Performed at [facility], [address] [place], [place] [address]    Culture  Setup Time   Final    GRAM POSITIVE COCCI ANAEROBIC BOTTLE ONLY CRITICAL RESULT CALLED TO, READ BACK BY AND VERIFIED WITH: [place]ST[place]OUDAS [place] [id number] 09/13/17 SDR    Culture STREPTOCOCCUS PNEUMONIAE (A)   Final   Report Status 09/15/2017 FINAL  Final   Organism ID, Bacteria STREPTOCOCCUS PNEUMONIAE  Final      Susceptibility   Streptococcus pneumoniae - MIC*    ERYTHROMYCIN <=0.12  SENSITIVE Sensitive     LEVOFLOXACIN 0.5 SENSITIVE Sensitive     PENICILLIN (meningitis) <=0.06 SENSITIVE Sensitive     PENICILLIN (non-meningitis) <=0.06 SENSITIVE Sensitive     CEFTRIAXONE (non-meningitis) <=0.12 SENSITIVE Sensitive     CEFTRIAXONE (meningitis) <=0.12 SENSITIVE Sensitive     * STREPTOCOCCUS PNEUMONIAE  Blood Culture ID Panel (Reflexed)     Status: Abnormal   Collection Time: 09/12/17  5:49 PM  Result Value Ref Range Status   Enterococcus species NOT DETECTED NOT DETECTED Final   Listeria monocytogenes NOT DETECTED NOT DETECTED Final   Staphylococcus species NOT DETECTED NOT DETECTED Final   Staphylococcus aureus NOT DETECTED NOT DETECTED Final   Streptococcus species DETECTED (A) NOT DETECTED Final    Comment: CRITICAL RESULT CALLED TO, READ BACK BY AND VERIFIED WITH:  CHRISTINE KATSOUDAS AT 4742 09/13/17 SDR    Streptococcus agalactiae NOT DETECTED NOT DETECTED Final   Streptococcus pneumoniae DETECTED (A) NOT DETECTED Final    Comment: CRITICAL RESULT CALLED TO, READ BACK BY AND VERIFIED WITH: CHRISTINE KATSOUDAS AT 5956 09/13/17 SDR    Streptococcus pyogenes NOT DETECTED NOT DETECTED Final   Acinetobacter baumannii NOT DETECTED NOT DETECTED Final   Enterobacteriaceae species NOT DETECTED NOT DETECTED Final   Enterobacter cloacae complex NOT DETECTED NOT DETECTED Final   Escherichia coli NOT DETECTED NOT DETECTED Final   Klebsiella oxytoca NOT DETECTED NOT DETECTED Final   Klebsiella pneumoniae NOT DETECTED NOT DETECTED Final   Proteus species NOT DETECTED NOT DETECTED Final   Serratia marcescens NOT DETECTED NOT DETECTED Final   Haemophilus influenzae NOT DETECTED NOT DETECTED Final   Neisseria meningitidis NOT DETECTED NOT DETECTED Final   Pseudomonas aeruginosa NOT DETECTED NOT  DETECTED Final   Candida albicans NOT DETECTED NOT DETECTED Final   Candida glabrata NOT DETECTED NOT DETECTED Final   Candida krusei NOT DETECTED NOT DETECTED Final   Candida parapsilosis NOT DETECTED NOT DETECTED Final   Candida tropicalis NOT DETECTED NOT DETECTED Final    Comment: Performed at [facility], [address], [place], [place] [address]      Management plans discussed with the patient, family and they are in agreement.  CODE STATUS:  Code Status History    Date Active Date Inactive Code Status Order ID Comments User Context   09/12/2017 18:09 09/15/2017 15:48 DNR [id number]  Loletha Grayer, MD ED   08/17/2016 21:54 08/22/2016 21:51 DNR [id number]  Karmen Bongo, MD Inpatient   07/19/2016 21:14 07/25/2016 19:23 Full Code [id number]  Nicholes Mango, MD Inpatient   07/19/2016 17:02 07/19/2016 21:14 Full Code [id number]  Nicholes Mango, MD ED    Questions for Most Recent Historical Code Status (Order [id number])    Question Answer Comment   In the event of cardiac or respiratory ARREST Do not call a "code blue"    In the event of cardiac or respiratory ARREST Do not perform Intubation, CPR, defibrillation or ACLS    In the event of cardiac or respiratory ARREST Use medication by any route, position, wound care, and other measures to relive pain and suffering. May use oxygen, suction and manual treatment of airway obstruction as needed for comfort.    Comments nurse may pronounce         Advance Directive Documentation     Most Recent Value  Type of Advance Directive  Out of facility DNR (pink MOST or yellow form), Healthcare Power of Attorney, Living will  Pre-existing out of facility DNR order (yellow form or pink MOST  form)  No data  "MOST" Form in Place?  No data      TOTAL TIME TAKING CARE OF THIS PATIENT: 35 minutes.    Loletha Grayer M.D on 09/15/2017 at 5:26 PM  Between 7am to 6pm - Pager - [phone]  After 6pm go to [email or web address] - [employer]  [employer] Office  [phone]  CC: Primary care physician; Chrismon, Vickki Muff, PA

## 2017-09-15 NOTE — H&P (Signed)
[facility] [place] NAME: Stephanie [name]    MR#:  [id number]  DATE OF BIRTH:  1935-09-24  DATE OF ADMISSION:  09/15/2017  PRIMARY CARE PHYSICIAN: Chrismon, Vickki Muff, PA   REQUESTING/REFERRING PHYSICIAN: Kerman Passey, MD  CHIEF COMPLAINT:   Chief Complaint  Patient presents with  . Shortness of Breath    HISTORY OF PRESENT ILLNESS:  Stephanie [name]  is a 82 y.o. female who presents with significant shortness of breath.  Patient was just discharged home earlier today after being treated here for pneumonia.  At home she was climbing her stairs and got significantly dyspneic.  Repeat workup here in the ED seems largely consistent with COPD exacerbation, likely exacerbated by her pneumonia.  She also had a positive troponin.  Hospitalist were called for admission  PAST MEDICAL HISTORY:   Past Medical History:  Diagnosis Date  . Arthritis   . Complication of anesthesia   . COPD (chronic obstructive pulmonary disease) ([place])   . Coronary artery disease 2013   stent  . Depression   . Edema   . Pneumonia    IN PAST  . PONV (postoperative nausea and vomiting)   . Stroke [facility])     PAST SURGICAL HISTORY:   Past Surgical History:  Procedure Laterality Date  . BALLOON ANGIOPLASTY, ARTERY  05/17/2009  . CATARACT EXTRACTION W/PHACO Left 05/24/2016   Procedure: CATARACT EXTRACTION PHACO AND INTRAOCULAR LENS PLACEMENT (IOC);  Surgeon: Birder Robson, MD;  Location: [facility] ORS;  Service: Ophthalmology;  Laterality: Left;  Lot# [id number] H [place]:   00:52.3 AP%:   27.3 CDE:  14.26   . CATARACT EXTRACTION W/PHACO Right 06/14/2016   Procedure: CATARACT EXTRACTION PHACO AND INTRAOCULAR LENS PLACEMENT (IOC);  Surgeon: Birder Robson, MD;  Location: [facility] ORS;  Service: Ophthalmology;  Laterality: Right;  Lot# [id number] H [place]: 01:13.4 AP%: 24.4 CDE: 17.85  . CORONARY ANGIOPLASTY     STENT  . EP IMPLANTABLE DEVICE N/A 08/19/2016   Procedure:  Loop Recorder Insertion;  Surgeon: Will Meredith Leeds, MD;  Location: [place] CV LAB;  Service: Cardiovascular;  Laterality: N/A;    SOCIAL HISTORY:   Social History   Tobacco Use  . Smoking status: Former Smoker    Last attempt to quit: 08/07/2001    Years since quitting: 16.1  . Smokeless tobacco: Never Used  . Tobacco comment: QUIT IN 2002  Substance Use Topics  . Alcohol use: No    Alcohol/week: 0.0 oz    FAMILY HISTORY:   Family History  Problem Relation Age of Onset  . Hypertension Mother   . Diabetes Mother   . Pancreatic cancer Mother 35  . Cancer Mother   . Suicidality Father 64  . Hypertension Father   . Alcohol abuse Father   . Hypertension Sister   . Heart disease Sister   . Kidney disease Sister   . Varicose Veins Sister   . Parkinson's disease Sister     DRUG ALLERGIES:   Allergies  Allergen Reactions  . Duloxetine Hcl     unknown  . Influenza Vaccines     unknown  . Venlafaxine     GI distress    MEDICATIONS AT HOME:   Prior to Admission medications   Medication Sig Start Date End Date Taking? Authorizing Provider  acetaminophen (TYLENOL) 500 MG tablet Take 1,000 mg by mouth 2 (two) times daily as needed.    Yes [provider]  albuterol (VENTOLIN HFA) 108 (90 Base)  MCG/ACT inhaler Inhale 2 puffs into the lungs every 6 (six) hours as needed. 12/05/16  Yes Chrismon, Vickki Muff, PA  amLODipine (NORVASC) 5 MG tablet Take 5 mg by mouth daily.   Yes [provider]  azithromycin (ZITHROMAX) 250 MG tablet Take 1 tablet (250 mg total) by mouth daily. 09/15/17  Yes Wieting, Richard, MD  Calcium Carb-Cholecalciferol (CALCIUM 600+D) 600-800 MG-UNIT TABS Take 2 tablets by mouth daily.   Yes [provider]  cefUROXime (CEFTIN) 500 MG tablet Take 1 tablet (500 mg total) by mouth 2 (two) times daily with a meal. 09/16/17  Yes Wieting, Richard, MD  cetirizine (ZYRTEC) 10 MG tablet Take 10 mg by mouth daily.   Yes [provider]  clonazePAM (KLONOPIN) 0.5 MG tablet TAKE 1 TABLET BY MOUTH TWICE DAILY AS NEEDED FOR ANXIETY Patient taking differently: TAKE 1 TABLET (0.5MG ) BY MOUTH AT BEDTIME 12/23/16  Yes Birdie Sons, MD  docusate sodium (COLACE) 250 MG capsule Take 250 mg by mouth at bedtime.    Yes [provider]  ELIQUIS 5 MG TABS tablet TAKE ONE (1) TABLET BY MOUTH TWO (2) TIMES DAILY 04/11/17  Yes Chrismon, Vickki Muff, PA  gabapentin (NEURONTIN) 100 MG capsule TAKE 1 CAPSULE BY MOUTH THREE TIMES A DAY Patient taking differently: TAKE 1 CAPSULE (100MG ) BY MOUTH TWICE DAILY 05/18/17  Yes Chrismon, Vickki Muff, PA  PARoxetine (PAXIL) 10 MG tablet Take 1-2 tablets (10-20 mg total) by mouth daily. Take 1 tablet (10MG ) in the morning and 2 tablets (20MG ) at bedtime 09/15/17  Yes Wieting, Richard, MD  ranitidine (ZANTAC 75) 75 MG tablet Take 0.5 tablets (37.5 mg total) by mouth 2 (two) times daily. 09/15/17 12/14/17 Yes Loletha Grayer, MD  Spacer/Aero Chamber Mouthpiece MISC Use spacer on inhalers for better administration. 10/06/16  Yes Chrismon, Vickki Muff, PA  SPIRIVA HANDIHALER 18 MCG inhalation capsule INHALE CONTENTS OF ONE CAPSULE ONCE A DAY AS DIRECTED 02/18/17  Yes Birdie Sons, MD  SYMBICORT 160-4.5 MCG/ACT inhaler INHALE TWO PUFFS INTO THE LUNGS TWICE A DAY 02/20/17  Yes Chrismon, Vickki Muff, PA    REVIEW OF SYSTEMS:  Review of Systems  Constitutional: Negative for chills, fever, malaise/fatigue and weight loss.  HENT: Negative for ear pain, hearing loss and tinnitus.   Eyes: Negative for blurred vision, double vision, pain and redness.  Respiratory: Positive for cough, shortness of breath and wheezing. Negative for hemoptysis.   Cardiovascular: Negative for chest pain, palpitations, orthopnea and leg swelling.  Gastrointestinal: Negative for abdominal pain, constipation, diarrhea, nausea and vomiting.  Genitourinary: Negative for dysuria, frequency and hematuria.  Musculoskeletal: Negative for  back pain, joint pain and neck pain.  Skin:       No acne, rash, or lesions  Neurological: Negative for dizziness, tremors, focal weakness and weakness.  Endo/Heme/Allergies: Negative for polydipsia. Does not bruise/bleed easily.  Psychiatric/Behavioral: Negative for depression. The patient is not nervous/anxious and does not have insomnia.      VITAL SIGNS:   Vitals:   09/15/17 2015 09/15/17 2025 09/15/17 2026 09/15/17 2100  BP:  (!) 188/87  (!) 171/76  Pulse: (!) 101 98  95  Resp: 16 19  15   Temp:      TempSrc:      SpO2: 96% 93% (!) 81% 98%  Weight:      Height:       Wt Readings from Last 3 Encounters:  09/15/17 59.4 kg (130 lb 15.3 oz)  09/12/17 54.9 kg (121 lb)  09/06/17 55.1 kg (121 lb 6.4 oz)    PHYSICAL EXAMINATION:  Physical Exam  Vitals reviewed. Constitutional: She is oriented to person, place, and time. She appears well-developed and well-nourished. No distress.  HENT:  Head: Normocephalic and atraumatic.  Mouth/Throat: Oropharynx is clear and moist.  Eyes: Conjunctivae and EOM are normal. Pupils are equal, round, and reactive to light. No scleral icterus.  Neck: Normal range of motion. Neck supple. No JVD present. No thyromegaly present.  Cardiovascular: Normal rate, regular rhythm and intact distal pulses. Exam reveals no gallop and no friction rub.  No murmur heard. Respiratory: Effort normal. No respiratory distress. She has wheezes. She has rales.  GI: Soft. Bowel sounds are normal. She exhibits no distension. There is no tenderness.  Musculoskeletal: Normal range of motion. She exhibits no edema.  No arthritis, no gout  Lymphadenopathy:    She has no cervical adenopathy.  Neurological: She is alert and oriented to person, place, and time. No cranial nerve deficit.  No dysarthria, no aphasia  Skin: Skin is warm and dry. No rash noted. No erythema.  Psychiatric: She has a normal mood and affect. Her behavior is normal. Judgment and thought content  normal.    LABORATORY PANEL:   CBC Recent Labs  Lab 09/15/17 1742  WBC 8.3  HGB 11.4*  HCT 33.9*  PLT 276   ------------------------------------------------------------------------------------------------------------------  Chemistries  Recent Labs  Lab 09/15/17 1742  NA 139  K 3.9  CL 104  CO2 23  GLUCOSE 104*  BUN 20  CREATININE 0.98  CALCIUM 9.7  AST 28  ALT 23  ALKPHOS 73  BILITOT 0.4   ------------------------------------------------------------------------------------------------------------------  Cardiac Enzymes Recent Labs  Lab 09/15/17 1742  TROPONINI 0.18*   ------------------------------------------------------------------------------------------------------------------  RADIOLOGY:  Dg Chest 2 View  Result Date: 09/15/2017 CLINICAL DATA:  Shortness of Breath EXAM: CHEST  2 VIEW COMPARISON:  September 12, 2017 FINDINGS: There is airspace consolidation consistent with pneumonia in the anterior segment of the right upper lobe, to a lesser extent in the posterior segment of the right upper lobe, and in the right lower lobe posteriorly and laterally. Opacity in the anterior segment of the right upper lobe and right base regions is stable. There is mild new consolidation in a portion of the posterior segment right upper lobe. The left lung is clear. Heart size and pulmonary vascular normal. No adenopathy. There is aortic atherosclerosis. No bone lesions. There is a loop recorder on the left anteriorly. IMPRESSION: Areas of infiltrate consistent with pneumonia in the right upper lobe and right base. Small focus of new infiltrate in the posterior segment right upper lobe. Other areas of infiltrate appear stable. Left lung clear. Stable cardiac silhouette. There is aortic atherosclerosis. Aortic Atherosclerosis (ICD10-I70.0). Electronically Signed   By: Lowella Grip III M.D.   On: 09/15/2017 19:37    EKG:   Orders placed or performed during the hospital  encounter of 09/15/17  . ED EKG  . ED EKG    IMPRESSION AND PLAN:  Principal Problem:   Acute exacerbation of chronic obstructive pulmonary disease (COPD) ([place]) -will use p.o. steroids, continue with antibiotics for her pneumonia, other supportive treatment PRN.  Patient states that steroids have been making it difficult for her to sleep, so we will be judicious in her use, however I do feel this patient would likely benefit from a longer slower taper perhaps starting a lower dose. Active Problems:   CAP (community acquired pneumonia) -continue antibiotics, other supportive treatment as above  Arteriosclerosis of coronary artery -home meds   Elevated troponin -troponin was 0.18, potentially due to her hypoxia or her demand during her dyspneic episode, we will trend her troponin tonight and get an echocardiogram tomorrow   Essential hypertension -continue home medications   Anxiety and depression -continue home meds   Expressive aphasia -this is residual from her prior stroke, she is currently at baseline  All the records are reviewed and case discussed with ED provider. Management plans discussed with the patient and/or family.  DVT PROPHYLAXIS: Systemic anticoagulation  GI PROPHYLAXIS: None  ADMISSION STATUS: Inpatient  CODE STATUS: DNR Code Status History    Date Active Date Inactive Code Status Order ID Comments User Context   09/12/2017 18:09 09/15/2017 15:48 DNR [id number]  Loletha Grayer, MD ED   08/17/2016 21:54 08/22/2016 21:51 DNR [id number]  Karmen Bongo, MD Inpatient   07/19/2016 21:14 07/25/2016 19:23 Full Code [id number]  Nicholes Mango, MD Inpatient   07/19/2016 17:02 07/19/2016 21:14 Full Code [id number]  Nicholes Mango, MD ED    Questions for Most Recent Historical Code Status (Order [id number])    Question Answer Comment   In the event of cardiac or respiratory ARREST Do not call a "code blue"    In the event of cardiac or respiratory ARREST Do not perform Intubation, CPR,  defibrillation or ACLS    In the event of cardiac or respiratory ARREST Use medication by any route, position, wound care, and other measures to relive pain and suffering. May use oxygen, suction and manual treatment of airway obstruction as needed for comfort.    Comments nurse may pronounce         Advance Directive Documentation     Most Recent Value  Type of Advance Directive  Out of facility DNR (pink MOST or yellow form)  Pre-existing out of facility DNR order (yellow form or pink MOST form)  Yellow form placed in chart (order not valid for inpatient use)  "MOST" Form in Place?  No data      TOTAL TIME TAKING CARE OF THIS PATIENT: 45 minutes.   Naser Schuld [place] 09/15/2017, 9:53 PM  [facility]  Office  [phone]  CC: Primary care physician; Margo Common, PA  Note:  This document was prepared using Dragon voice recognition software and may include unintentional dictation errors.

## 2017-09-16 ENCOUNTER — Inpatient Hospital Stay: Admit: 2017-09-16 | Payer: PPO

## 2017-09-16 ENCOUNTER — Telehealth: Payer: Self-pay | Admitting: Family Medicine

## 2017-09-16 DIAGNOSIS — R4701 Aphasia: Secondary | ICD-10-CM

## 2017-09-16 DIAGNOSIS — J189 Pneumonia, unspecified organism: Secondary | ICD-10-CM

## 2017-09-16 DIAGNOSIS — J441 Chronic obstructive pulmonary disease with (acute) exacerbation: Secondary | ICD-10-CM

## 2017-09-16 DIAGNOSIS — I248 Other forms of acute ischemic heart disease: Secondary | ICD-10-CM

## 2017-09-16 DIAGNOSIS — I2510 Atherosclerotic heart disease of native coronary artery without angina pectoris: Secondary | ICD-10-CM

## 2017-09-16 DIAGNOSIS — R748 Abnormal levels of other serum enzymes: Secondary | ICD-10-CM

## 2017-09-16 LAB — BASIC METABOLIC PANEL
Anion gap: 12 (ref 5–15)
BUN: 20 mg/dL (ref 6–20)
CO2: 24 mmol/L (ref 22–32)
CREATININE: 1.06 mg/dL — AB (ref 0.44–1.00)
Calcium: 9.4 mg/dL (ref 8.9–10.3)
Chloride: 105 mmol/L (ref 101–111)
GFR calc Af Amer: 56 mL/min — ABNORMAL LOW (ref >60)
GFR, EST NON AFRICAN AMERICAN: 49 mL/min — AB (ref >60)
GLUCOSE: 101 mg/dL — AB (ref 65–99)
POTASSIUM: 3.5 mmol/L (ref 3.5–5.1)
SODIUM: 141 mmol/L (ref 135–145)

## 2017-09-16 LAB — CBC
HEMATOCRIT: 29.1 % — AB (ref 35.0–47.0)
Hemoglobin: 9.8 g/dL — ABNORMAL LOW (ref 12.0–16.0)
MCH: 31.4 pg (ref 26.0–34.0)
MCHC: 33.5 g/dL (ref 32.0–36.0)
MCV: 93.8 fL (ref 80.0–100.0)
PLATELETS: 269 10*3/uL (ref 150–440)
RBC: 3.1 MIL/uL — ABNORMAL LOW (ref 3.80–5.20)
RDW: 14.8 % — AB (ref 11.5–14.5)
WBC: 6.4 10*3/uL (ref 3.6–11.0)

## 2017-09-16 LAB — TROPONIN I
Troponin I: 0.12 ng/mL (ref <0.03)
Troponin I: <0.03 ng/mL (ref <0.03)

## 2017-09-16 MED ORDER — BENZONATATE 100 MG PO CAPS
100.0000 mg | ORAL_CAPSULE | Freq: Three times a day (TID) | ORAL | Status: DC
  Administered 2017-09-16 – 2017-09-17 (×4): 100 mg via ORAL
  Filled 2017-09-16 (×4): qty 1

## 2017-09-16 NOTE — Consult Note (Signed)
Cardiology Consultation:   Stephanie Stephanie; [id number]; 11/02/1935   Admit date: 09/15/2017 Date of Consult: 09/16/2017  Primary Care Provider: Margo Common, PA Primary Cardiologist: New to [facility]   Stephanie Profile:   Stephanie Stephanie is a 82 y.o. female with a hx of coronary artery disease, stent, severe COPD, stroke, pneumonia, who is being seen today for Stephanie evaluation of elevated troponin at Stephanie request of Dr. Lance Coon.  History of Present Illness:   Stephanie Stephanie has history of COPD exacerbation, prior smoker quit 2002, pneumonia, recently discharged from Stephanie hospital for similar symptoms, reports that she got home was going up Stephanie stairs from Stephanie first floor to second floor when she developed acute shortness of breath, unable to recover even with albuterol inhaler.  Was on prednisone in Stephanie hospital, this was discontinued at discharge secondary to possible delirium.  Stephanie has nebulizer machine at home but does not use this Last pneumonia 2017 Discharged on Z-Pak and Ceftin  This morning is supine in bed, poor historian Daughter at Stephanie bedside, reports since Stephanie stroke she has difficulty communicating No acute distress  Significant bronchospastic cough on exam Receiving nebulizers Levaquin, prednisone taper  Past Medical History:  Diagnosis Date  . Arthritis   . Complication of anesthesia   . COPD (chronic obstructive pulmonary disease) ([place])   . Coronary artery disease 2013   stent  . Depression   . Edema   . Pneumonia    IN PAST  . PONV (postoperative nausea and vomiting)   . Stroke [facility])     Past Surgical History:  Procedure Laterality Date  . BALLOON ANGIOPLASTY, ARTERY  05/17/2009  . CATARACT EXTRACTION W/PHACO Left 05/24/2016   Procedure: CATARACT EXTRACTION PHACO AND INTRAOCULAR LENS PLACEMENT (IOC);  Surgeon: Birder Robson, MD;  Location: [facility] ORS;  Service: Ophthalmology;  Laterality: Left;  Lot# [id number] H [place]:    00:52.3 AP%:   27.3 CDE:  14.26   . CATARACT EXTRACTION W/PHACO Right 06/14/2016   Procedure: CATARACT EXTRACTION PHACO AND INTRAOCULAR LENS PLACEMENT (IOC);  Surgeon: Birder Robson, MD;  Location: [facility] ORS;  Service: Ophthalmology;  Laterality: Right;  Lot# [id number] H [place]: 01:13.4 AP%: 24.4 CDE: 17.85  . CORONARY ANGIOPLASTY     STENT  . EP IMPLANTABLE DEVICE N/A 08/19/2016   Procedure: Loop Recorder Insertion;  Surgeon: Will Meredith Leeds, MD;  Location: [place] CV LAB;  Service: Cardiovascular;  Laterality: N/A;     Home Medications:  Prior to Admission medications   Medication Sig Start Date End Date Taking? Authorizing Provider  acetaminophen (TYLENOL) 500 MG tablet Take 1,000 mg by mouth 2 (two) times daily as needed.    Yes [provider]  albuterol (VENTOLIN HFA) 108 (90 Base) MCG/ACT inhaler Inhale 2 puffs into Stephanie lungs every 6 (six) hours as needed. 12/05/16  Yes Chrismon, Vickki Muff, PA  amLODipine (NORVASC) 5 MG tablet Take 5 mg by mouth daily.   Yes [provider]  azithromycin (ZITHROMAX) 250 MG tablet Take 1 tablet (250 mg total) by mouth daily. 09/15/17  Yes Wieting, Richard, MD  Calcium Carb-Cholecalciferol (CALCIUM 600+D) 600-800 MG-UNIT TABS Take 2 tablets by mouth daily.   Yes [provider]  cefUROXime (CEFTIN) 500 MG tablet Take 1 tablet (500 mg total) by mouth 2 (two) times daily with a meal. 09/16/17  Yes Wieting, Richard, MD  cetirizine (ZYRTEC) 10 MG tablet Take 10 mg by mouth daily.   Yes [provider]  clonazePAM (KLONOPIN) 0.5  MG tablet TAKE 1 TABLET BY MOUTH TWICE DAILY AS NEEDED FOR ANXIETY Stephanie taking differently: TAKE 1 TABLET (0.5MG ) BY MOUTH AT BEDTIME 12/23/16  Yes Birdie Sons, MD  docusate sodium (COLACE) 250 MG capsule Take 250 mg by mouth at bedtime.    Yes [provider]  ELIQUIS 5 MG TABS tablet TAKE ONE (1) TABLET BY MOUTH TWO (2) TIMES DAILY 04/11/17  Yes Chrismon, Vickki Muff, PA  gabapentin  (NEURONTIN) 100 MG capsule TAKE 1 CAPSULE BY MOUTH THREE TIMES A DAY Stephanie taking differently: TAKE 1 CAPSULE (100MG ) BY MOUTH TWICE DAILY 05/18/17  Yes Chrismon, Vickki Muff, PA  PARoxetine (PAXIL) 10 MG tablet Take 1-2 tablets (10-20 mg total) by mouth daily. Take 1 tablet (10MG ) in Stephanie morning and 2 tablets (20MG ) at bedtime 09/15/17  Yes Wieting, Richard, MD  ranitidine (ZANTAC 75) 75 MG tablet Take 0.5 tablets (37.5 mg total) by mouth 2 (two) times daily. 09/15/17 12/14/17 Yes Loletha Grayer, MD  Spacer/Aero Chamber Mouthpiece MISC Use spacer on inhalers for better administration. 10/06/16  Yes Chrismon, Vickki Muff, PA  SPIRIVA HANDIHALER 18 MCG inhalation capsule INHALE CONTENTS OF ONE CAPSULE ONCE A DAY AS DIRECTED 02/18/17  Yes Birdie Sons, MD  SYMBICORT 160-4.5 MCG/ACT inhaler INHALE TWO PUFFS INTO Stephanie LUNGS TWICE A DAY 02/20/17  Yes Chrismon, Vickki Muff, PA    Inpatient Medications: Scheduled Meds: . amLODipine  5 mg Oral Daily  . apixaban  5 mg Oral BID  . benzonatate  100 mg Oral TID  . clonazePAM  0.5 mg Oral QHS  . famotidine  10 mg Oral BID  . gabapentin  100 mg Oral TID  . mometasone-formoterol  2 puff Inhalation BID  . PARoxetine  10 mg Oral Daily  . PARoxetine  20 mg Oral QHS  . predniSONE  30 mg Oral Q breakfast   Followed by  . [START ON 09/18/2017] predniSONE  20 mg Oral Q breakfast   Followed by  . [START ON 09/20/2017] predniSONE  10 mg Oral Q breakfast   Followed by  . [START ON 09/22/2017] predniSONE  5 mg Oral Q breakfast  . tiotropium  18 mcg Inhalation Daily   Continuous Infusions: . [START ON 09/17/2017] levofloxacin (LEVAQUIN) IV     PRN Meds: acetaminophen **OR** acetaminophen, diphenhydrAMINE, guaiFENesin-dextromethorphan, ipratropium-albuterol, ondansetron **OR** ondansetron (ZOFRAN) IV  Allergies:    Allergies  Allergen Reactions  . Duloxetine Hcl     unknown  . Influenza Vaccines     unknown  . Venlafaxine     GI distress    Social History:    Social History   Socioeconomic History  . Marital status: Widowed    Spouse name: Not on file  . Number of children: 3  . Years of education: Not on file  . Highest education level: Not on file  Social Needs  . Financial resource strain: Not on file  . Food insecurity - worry: Not on file  . Food insecurity - inability: Not on file  . Transportation needs - medical: Not on file  . Transportation needs - non-medical: Not on file  Occupational History  . Occupation: retired  Tobacco Use  . Smoking status: Former Smoker    Last attempt to quit: 08/07/2001    Years since quitting: 16.1  . Smokeless tobacco: Never Used  . Tobacco comment: QUIT IN 2002  Substance and Sexual Activity  . Alcohol use: No    Alcohol/week: 0.0 oz  . Drug use: No  .  Sexual activity: Not on file  Other Topics Concern  . Not on file  Social History Narrative   Lives alone    Family History:    Family History  Problem Relation Age of Onset  . Hypertension Mother   . Diabetes Mother   . Pancreatic cancer Mother 43  . Cancer Mother   . Suicidality Father 4  . Hypertension Father   . Alcohol abuse Father   . Hypertension Sister   . Heart disease Sister   . Kidney disease Sister   . Varicose Veins Sister   . Parkinson's disease Sister      ROS:  Please see Stephanie history of present illness.  Review of Systems  Constitutional: Positive for malaise/fatigue.  Respiratory: Positive for cough, shortness of breath and wheezing.   Cardiovascular: Negative.   Gastrointestinal: Negative.   Musculoskeletal: Negative.   Neurological: Positive for weakness.  Psychiatric/Behavioral: Negative.   All other systems reviewed and are negative.   Physical Exam/Data:   Vitals:   09/15/17 2230 09/16/17 0307 09/16/17 0544 09/16/17 0851  BP: (!) 149/97  139/69 124/68  Pulse:   84 94  Resp:   18 18  Temp:   98.1 F (36.7 C) (!) 97.5 F (36.4 C)  TempSrc:   Oral Oral  SpO2:   100% 98%  Weight:  124 lb  8 oz (56.5 kg)    Height:  5\' 2"  (1.575 m)      Intake/Output Summary (Last 24 hours) at 09/16/2017 1443 Last data filed at 09/16/2017 1159 Gross per 24 hour  Intake 390 ml  Output 650 ml  Net -260 ml   Filed Weights   09/15/17 1739 09/16/17 0307  Weight: 130 lb 15.3 oz (59.4 kg) 124 lb 8 oz (56.5 kg)   Body mass index is 22.77 kg/m.  General:  Well nourished, well developed, in no acute distress, on nasal cannula oxygen  HEENT: normal Lymph: no adenopathy Neck: no JVD Endocrine:  No thryomegaly Vascular: No carotid bruits; FA pulses 2+ bilaterally without bruits  Cardiac:  normal S1, S2; RRR; no murmur  Lungs: Moderately decreased breath sounds throughout, wheezing,rhonchi appreciated with bronchospastic cough  Abd: soft, nontender, no hepatomegaly  Ext: no edema Musculoskeletal:  No deformities, BUE and BLE strength normal and equal Skin: warm and dry  Neuro:  CNs 2-12 intact, no focal abnormalities noted Psych:  Normal affect   EKG:  Stephanie EKG was personally reviewed and demonstrates: Normal sinus rhythm with diffuse ST and T wave abnormality anterolateral leads, inferior leads Telemetry:  Telemetry was personally reviewed and demonstrates: Normal sinus rhythm  Relevant CV Studies: Previous echocardiogram October 09, 2017 showing normal LV function  Laboratory Data:  Chemistry Recent Labs  Lab 10/09/17 0545 09/15/17 0327 09/15/17 1742 09/16/17 0455  NA 136  --  139 141  K 3.5  --  3.9 3.5  CL 106  --  104 105  CO2 22  --  23 24  GLUCOSE 107*  --  104* 101*  BUN 21*  --  20 20  CREATININE 1.23* 0.99 0.98 1.06*  CALCIUM 8.5*  --  9.7 9.4  GFRNONAA 41* 53* 53* 49*  GFRAA 47* >60 >60 56*  ANIONGAP 8  --  12 12    Recent Labs  Lab 09/15/17 1742  PROT 6.9  ALBUMIN 3.2*  AST 28  ALT 23  ALKPHOS 73  BILITOT 0.4   Hematology Recent Labs  Lab 09/15/17 0327 09/15/17 1742 09/16/17 0455  WBC 9.3 8.3 6.4  RBC 3.11* 3.60* 3.10*  HGB 9.7* 11.4* 9.8*  HCT  29.4* 33.9* 29.1*  MCV 94.5 94.1 93.8  MCH 31.2 31.7 31.4  MCHC 33.0 33.7 33.5  RDW 14.6* 14.9* 14.8*  PLT 264 276 269   Cardiac Enzymes Recent Labs  Lab 09/12/17 2018 09/12/17 2351 09/15/17 1742 09/15/17 2356 09/16/17 0455 09/16/17 1147  TROPONINI <0.03 <0.03 0.18* <0.03 0.12* <0.03   No results for input(s): TROPIPOC in Stephanie last 168 hours.  BNPNo results for input(s): BNP, PROBNP in Stephanie last 168 hours.  DDimer No results for input(s): DDIMER in Stephanie last 168 hours.  Radiology/Studies:  Dg Chest 2 View  Result Date: 09/15/2017 CLINICAL DATA:  Shortness of Breath EXAM: CHEST  2 VIEW COMPARISON:  September 12, 2017 FINDINGS: There is airspace consolidation consistent with pneumonia in Stephanie anterior segment of Stephanie right upper lobe, to a lesser extent in Stephanie posterior segment of Stephanie right upper lobe, and in Stephanie right lower lobe posteriorly and laterally. Opacity in Stephanie anterior segment of Stephanie right upper lobe and right base regions is stable. There is mild new consolidation in a portion of Stephanie posterior segment right upper lobe. Stephanie left lung is clear. Heart size and pulmonary vascular normal. No adenopathy. There is aortic atherosclerosis. No bone lesions. There is a loop recorder on Stephanie left anteriorly. IMPRESSION: Areas of infiltrate consistent with pneumonia in Stephanie right upper lobe and right base. Small focus of new infiltrate in Stephanie posterior segment right upper lobe. Other areas of infiltrate appear stable. Left lung clear. Stable cardiac silhouette. There is aortic atherosclerosis. Aortic Atherosclerosis (ICD10-I70.0). Electronically Signed   By: Lowella Grip III M.D.   On: 09/15/2017 19:37   Dg Chest 2 View  Result Date: 09/12/2017 CLINICAL DATA:  Shortness of breath. EXAM: CHEST  2 VIEW COMPARISON:  Radiograph of July 21, 2016. FINDINGS: Stephanie heart size and mediastinal contours are within normal limits. Atherosclerosis of thoracic aorta is noted. No pneumothorax is noted. Right  upper and lower lobe airspace opacities are noted concerning for pneumonia. No significant pleural effusion is noted. Stephanie visualized skeletal structures are unremarkable. IMPRESSION: Right upper lobe and lower lobe pneumonia. Aortic atherosclerosis. Followup PA and lateral chest X-ray is recommended in 3-4 weeks following trial of antibiotic therapy to ensure resolution and exclude underlying malignancy. Electronically Signed   By: Marijo Conception, M.D.   On: 09/12/2017 17:18   Ct Head Wo Contrast  Result Date: 09/12/2017 CLINICAL DATA:  82 y/o F; unsteady gait, jumbled speech, shortness of breath. History of CVA. EXAM: CT HEAD WITHOUT CONTRAST TECHNIQUE: Contiguous axial images were obtained from Stephanie base of Stephanie skull through Stephanie vertex without intravenous contrast. COMPARISON:  03/28/2011 CT head. FINDINGS: Brain: No evidence of acute infarction, hemorrhage, hydrocephalus, extra-axial collection or mass lesion/mass effect. Stable chronic infarcts in Stephanie bilateral superior cerebellar hemispheres and left genu of Stephanie corpus callosum. Stable chronic microvascular ischemic changes and parenchymal volume loss of Stephanie brain. Interval large chronic left posterior MCA distribution infarction. Ex vacuo dilatation of left lateral ventricle. Vascular: Calcific atherosclerosis of carotid siphons. No hyperdense vessel. Skull: Normal. Negative for fracture or focal lesion. Sinuses/Orbits: No acute finding. Other: None. IMPRESSION: 1. No acute intracranial abnormality identified. 2. Interval large left posterior MCA distribution chronic infarction. 3. Stable chronic infarctions of cerebellum and left genu of corpus callosum, chronic microvascular ischemic changes, and parenchymal volume loss of Stephanie brain. Electronically Signed   By: Kristine Garbe  M.D.   On: 09/12/2017 18:03    Assessment and Plan:     1)  Acute exacerbation of chronic obstructive pulmonary disease (COPD) ([place]) - Reason for readmission likely  secondary to persistent symptoms from COPD exacerbation.  No steroids at home (may have made it difficult for her to sleep), not using her nebulizer at home Currently on p.o. steroids, broad-spectrum antibiotics  2) elevated troponin Likely demand ischemia (hypoxia)  in Stephanie setting of known coronary artery disease -troponin was 0.18, non-trending Recent echocardiogram last admission normal ejection fraction Long discussion with Stephanie and family, we will not pursue ischemic workup at this time given underlying lung disease and inability to lay flat without coughing. -At a later date could pursue pharmacologic Myoview if she chooses No strong indication to continue heparin infusion given she is asymptomatic, non-trending enzymes  3)  Essential hypertension - continue home medications  4)   Expressive aphasia - this is residual from her prior stroke, she is currently at baseline On Eliquis  Long discussion with Stephanie and daughter at Stephanie bedside, all questions answered  Total encounter time more than 110 minutes  Greater than 50% was spent in counseling and coordination of care with Stephanie Stephanie  For questions or updates, please contact [place] Please consult [email or web address] for contact info under Cardiology/STEMI.   Signed, Ida Rogue, MD  09/16/2017 2:43 PM

## 2017-09-16 NOTE — Progress Notes (Signed)
[place] [facility] [place] NAME: Stephanie [name]    MR#:  [id number]  DATE OF BIRTH:  1936/04/05  SUBJECTIVE:   Patient came back after feeling short of breath while trying to climb steps in her apartment.  She was just released yesterday.  Her sats were stable vitals were stable at discharge.  Today having some dry cough and chest tightness due to shortness of breath.  Sats are stable.  Daughter in the room. REVIEW OF SYSTEMS:   Review of Systems  Constitutional: Negative for chills, fever and weight loss.  HENT: Negative for ear discharge, ear pain and nosebleeds.   Eyes: Negative for blurred vision, pain and discharge.  Respiratory: Positive for cough and shortness of breath. Negative for sputum production, wheezing and stridor.   Cardiovascular: Negative for chest pain, palpitations, orthopnea and PND.  Gastrointestinal: Negative for abdominal pain, diarrhea, nausea and vomiting.  Genitourinary: Negative for frequency and urgency.  Musculoskeletal: Negative for back pain and joint pain.  Neurological: Positive for weakness. Negative for sensory change, speech change and focal weakness.  Psychiatric/Behavioral: Negative for depression and hallucinations. The patient is not nervous/anxious.    Tolerating Diet:yes Tolerating PT: Pending  DRUG ALLERGIES:   Allergies  Allergen Reactions  . Duloxetine Hcl     unknown  . Influenza Vaccines     unknown  . Venlafaxine     GI distress    VITALS:  Blood pressure 124/68, pulse 94, temperature (!) 97.5 F (36.4 C), temperature source Oral, resp. rate 18, height 5\' 2"  (1.575 m), weight 56.5 kg (124 lb 8 oz), SpO2 98 %.  PHYSICAL EXAMINATION:   Physical Exam  GENERAL:  82 y.o.-year-old patient lying in the bed with no acute distress.  EYES: Pupils equal, round, reactive to light and accommodation. No scleral icterus. Extraocular muscles intact.  HEENT: Head atraumatic, normocephalic.  Oropharynx and nasopharynx clear.  NECK:  Supple, no jugular venous distention. No thyroid enlargement, no tenderness.  LUNGS:-Coarse breath sounds bilaterally, no wheezing, rales, rhonchi. No use of accessory muscles of respiration.  CARDIOVASCULAR: S1, S2 normal. No murmurs, rubs, or gallops.  ABDOMEN: Soft, nontender, nondistended. Bowel sounds present. No organomegaly or mass.  EXTREMITIES: No cyanosis, clubbing or edema b/l.    NEUROLOGIC: Cranial nerves II through XII are intact. No focal Motor or sensory deficits b/l.   PSYCHIATRIC:  patient is alert and oriented x 3.  SKIN: No obvious rash, lesion, or ulcer.   LABORATORY PANEL:  CBC Recent Labs  Lab 09/16/17 0455  WBC 6.4  HGB 9.8*  HCT 29.1*  PLT 269    Chemistries  Recent Labs  Lab 09/15/17 1742 09/16/17 0455  NA 139 141  K 3.9 3.5  CL 104 105  CO2 23 24  GLUCOSE 104* 101*  BUN 20 20  CREATININE 0.98 1.06*  CALCIUM 9.7 9.4  AST 28  --   ALT 23  --   ALKPHOS 73  --   BILITOT 0.4  --    Cardiac Enzymes Recent Labs  Lab 09/16/17 1147  TROPONINI <0.03   RADIOLOGY:  Dg Chest 2 View  Result Date: 09/15/2017 CLINICAL DATA:  Shortness of Breath EXAM: CHEST  2 VIEW COMPARISON:  September 12, 2017 FINDINGS: There is airspace consolidation consistent with pneumonia in the anterior segment of the right upper lobe, to a lesser extent in the posterior segment of the right upper lobe, and in the right lower lobe posteriorly and laterally. Opacity  in the anterior segment of the right upper lobe and right base regions is stable. There is mild new consolidation in a portion of the posterior segment right upper lobe. The left lung is clear. Heart size and pulmonary vascular normal. No adenopathy. There is aortic atherosclerosis. No bone lesions. There is a loop recorder on the left anteriorly. IMPRESSION: Areas of infiltrate consistent with pneumonia in the right upper lobe and right base. Small focus of new infiltrate in the  posterior segment right upper lobe. Other areas of infiltrate appear stable. Left lung clear. Stable cardiac silhouette. There is aortic atherosclerosis. Aortic Atherosclerosis (ICD10-I70.0). Electronically Signed   By: Lowella Grip III M.D.   On: 09/15/2017 19:37   ASSESSMENT AND PLAN:  Devinn [name]  is a 82 y.o. female who presents with significant shortness of breath.  Patient was just discharged home earlier today after being treated here for pneumonia.  At home she was climbing her stairs and got significantly dyspneic.  Repeat workup here in the ED seems largely consistent with COPD exacerbation,  1 1.  Acute exacerbation of chronic obstructive pulmonary disease (COPD) ([place])  -will use p.o. steroids, continue with antibiotics for her pneumonia, other supportive treatment PRN.    2.  CAP (community acquired pneumonia) -continue antibiotics, other supportive treatment as above    3.  Elevated troponin -troponin was 0.18, potentially due to her hypoxia or her demand during her dyspneic episode -She was seen by Dr. Rockey Situ recommends continue cardiac medications.  No further evaluation at present since this seems demand ischemia  4.  Essential hypertension -continue home medications  5.  Anxiety and depression -continue home meds    6.  Expressive aphasia -this is residual from her prior stroke, she is currently at baseline  Physical therapy to see patient. Assess for home oxygen Management/social worker for discharge planning  Case discussed with Care Management/Social Worker. Management plans discussed with the patient, family and they are in agreement.  CODE STATUS:  DNR  DVT Prophylaxis: Lovenox  TOTAL TIME TAKING CARE OF THIS PATIENT: 30 minutes.  >50% time spent on counselling and coordination of care  POSSIBLE D/C IN 1-2 DAYS, DEPENDING ON CLINICAL CONDITION.  Note: This dictation was prepared with Dragon dictation along with smaller phrase technology. Any  transcriptional errors that result from this process are unintentional.  Fritzi Mandes M.D on 09/16/2017 at 3:33 PM  Between 7am to 6pm - Pager - [phone]  After 6pm go to [email or web address] - password [email or web address] [facility]  Office  [phone]  CC: Primary care physician; Margo Common, PAPatient ID: Howell Rucks, female   DOB: 12/15/1935, 82 y.o.   MRN: [id number]

## 2017-09-16 NOTE — Telephone Encounter (Signed)
Pt's daughter Butch Penny is requesting a call back today. Butch Penny stated pt is back at the hospital and the hospital is wanting to know if pt had A & B of the pneumonia vaccine. Butch Penny stated pt had the shot in 2015 and the hospital doesn't see those records. Please advise. Thanks TNP

## 2017-09-17 MED ORDER — BENZONATATE 100 MG PO CAPS: 100.0000 mg | ORAL_CAPSULE | Freq: Three times a day (TID) | ORAL | 0 refills | Status: DC

## 2017-09-17 MED ORDER — LEVOFLOXACIN 750 MG PO TABS
750.0000 mg | ORAL_TABLET | ORAL | Status: DC
  Administered 2017-09-17: 750 mg via ORAL
  Filled 2017-09-17: qty 1

## 2017-09-17 MED ORDER — DIPHENHYDRAMINE HCL 25 MG PO CAPS: 25.0000 mg | ORAL_CAPSULE | Freq: Every evening | ORAL | 0 refills | Status: DC | PRN

## 2017-09-17 MED ORDER — GUAIFENESIN-DM 100-10 MG/5ML PO SYRP: 5.0000 mL | ORAL_SOLUTION | ORAL | 0 refills | Status: DC | PRN

## 2017-09-17 MED ORDER — PREDNISOLONE 5 MG PO TABS: 5.0000 mg | ORAL_TABLET | Freq: Every day | ORAL | 0 refills | Status: DC

## 2017-09-17 NOTE — Plan of Care (Signed)
  Progressing Education: Knowledge of General Education information will improve 09/17/2017 0019 - Progressing by Loran Senters, RN Activity: Risk for activity intolerance will decrease 09/17/2017 0019 - Progressing by Loran Senters, RN Pain Managment: General experience of comfort will improve 09/17/2017 0019 - Progressing by Loran Senters, RN Safety: Ability to remain free from injury will improve 09/17/2017 0019 - Progressing by Loran Senters, RN Respiratory: Levels of oxygenation will improve 09/17/2017 0019 - Progressing by Loran Senters, RN Ability to maintain adequate ventilation will improve 09/17/2017 0019 - Progressing by Loran Senters, RN

## 2017-09-17 NOTE — Discharge Summary (Signed)
[facility] NAME: Stephanie [name]    MR#:  [id number]  DATE OF BIRTH:  Feb 16, 1936  DATE OF ADMISSION:  09/15/2017 ADMITTING PHYSICIAN: Lance Coon, MD  DATE OF DISCHARGE:  09/17/17   PRIMARY CARE PHYSICIAN: Chrismon, Vickki Muff, PA    ADMISSION DIAGNOSIS:  Acute exacerbation of COPD with asthma ([place]) [J44.1, J45.901]  DISCHARGE DIAGNOSIS:  Principal Problem:   Acute exacerbation of chronic obstructive pulmonary disease (COPD) ([place]) Active Problems:   Anxiety and depression   Arteriosclerosis of coronary artery   Elevated troponin   Expressive aphasia   Essential hypertension   CAP (community acquired pneumonia)   SECONDARY DIAGNOSIS:   Past Medical History:  Diagnosis Date  . Arthritis   . Complication of anesthesia   . COPD (chronic obstructive pulmonary disease) ([place])   . Coronary artery disease 2013   stent  . Depression   . Edema   . Pneumonia    IN PAST  . PONV (postoperative nausea and vomiting)   . Stroke [facility])     HOSPITAL COURSE:   Stephanie [name]  is a 82 y.o. female who presents with significant shortness of breath.  Patient was just discharged home earlier today after being treated here for pneumonia.  At home she was climbing her stairs and got significantly dyspneic.  Repeat workup here in the ED seems largely consistent with COPD exacerbation, likely exacerbated by her pneumonia.  She also had a positive troponin.  Hospitalist were called for admission    1 1.  Acute exacerbation of chronic obstructive pulmonary disease (COPD) (H[facility])  -Prednisone dose reduced to 20 mg p.o. once daily as requested by the patient and her daughter at bedside  continue with antibiotics for her pneumonia, other supportive treatment PRN  Which were given at the time of discharge Patient was seen by physical therapy who is recommending home health PT and 2 L of oxygen via nasal cannula, patient got qualified for  home oxygen, case manager is following   2.  CAP (community acquired pneumonia) -continue antibiotics, other supportive treatment as above    3.  Elevated troponin -troponin was 0.18, potentially due to her hypoxia or her demand during her dyspneic episode -She was seen by Dr. Rockey Situ recommends continue cardiac medications.  No further evaluation at present since this seems demand ischemia  4.  Essential hypertension -continue home medications  5.  Anxiety and depression -continue home med Klonopin and Paxil    6.  Expressive aphasia -this is residual from her prior stroke, she is currently at baseline   Case discussed with Care Management/Social Worker. Management plans discussed with the patient, 2 daughters and son at bedside and they are in agreement.  Discharge patient home with home health and 2 L of oxygen via nasal cannula   DISCHARGE CONDITIONS:   stable  CONSULTS OBTAINED:     PROCEDURES  None   DRUG ALLERGIES:   Allergies  Allergen Reactions  . Duloxetine Hcl     unknown  . Influenza Vaccines     unknown  . Venlafaxine     GI distress    DISCHARGE MEDICATIONS:   Allergies as of 09/17/2017      Reactions   Duloxetine Hcl    unknown   Influenza Vaccines    unknown   Venlafaxine    GI distress      Medication List    TAKE these medications   albuterol 108 (90  Base) MCG/ACT inhaler Commonly known as:  VENTOLIN HFA Inhale 2 puffs into the lungs every 6 (six) hours as needed.   amLODipine 5 MG tablet Commonly known as:  NORVASC Take 5 mg by mouth daily.   azithromycin 250 MG tablet Commonly known as:  ZITHROMAX Take 1 tablet (250 mg total) by mouth daily.   benzonatate 100 MG capsule Commonly known as:  TESSALON Take 1 capsule (100 mg total) by mouth 3 (three) times daily.   CALCIUM 600+D 600-800 MG-UNIT Tabs Generic drug:  Calcium Carb-Cholecalciferol Take 2 tablets by mouth daily.   cefUROXime 500 MG tablet Commonly known as:   CEFTIN Take 1 tablet (500 mg total) by mouth 2 (two) times daily with a meal.   cetirizine 10 MG tablet Commonly known as:  ZYRTEC Take 10 mg by mouth daily.   clonazePAM 0.5 MG tablet Commonly known as:  KLONOPIN TAKE 1 TABLET BY MOUTH TWICE DAILY AS NEEDED FOR ANXIETY What changed:    how much to take  how to take this  when to take this  additional instructions   diphenhydrAMINE 25 mg capsule Commonly known as:  BENADRYL Take 1 capsule (25 mg total) by mouth at bedtime as needed for sleep.   docusate sodium 250 MG capsule Commonly known as:  COLACE Take 250 mg by mouth at bedtime.   ELIQUIS 5 MG Tabs tablet Generic drug:  apixaban TAKE ONE (1) TABLET BY MOUTH TWO (2) TIMES DAILY   gabapentin 100 MG capsule Commonly known as:  NEURONTIN TAKE 1 CAPSULE BY MOUTH THREE TIMES A DAY What changed:  See the new instructions.   guaiFENesin-dextromethorphan 100-10 MG/5ML syrup Commonly known as:  ROBITUSSIN DM Take 5 mLs by mouth every 4 (four) hours as needed for cough.   PARoxetine 10 MG tablet Commonly known as:  PAXIL Take 1-2 tablets (10-20 mg total) by mouth daily. Take 1 tablet (10MG ) in the morning and 2 tablets (20MG ) at bedtime   prednisoLONE 5 MG Tabs tablet Take 1 tablet (5 mg total) by mouth daily. Label  & dispense according to the schedule below: 4   Pills PO for 3 days, followed by 2   Pills PO for 3 days, followed by 1   Pills PO for 3 days then STOP.   ranitidine 75 MG tablet Commonly known as:  ZANTAC 75 Take 0.5 tablets (37.5 mg total) by mouth 2 (two) times daily.   Spacer/Aero Chamber [employer] Use spacer on inhalers for better administration.   SPIRIVA HANDIHALER 18 MCG inhalation capsule Generic drug:  tiotropium INHALE CONTENTS OF ONE CAPSULE ONCE A DAY AS DIRECTED   SYMBICORT 160-4.5 MCG/ACT inhaler Generic drug:  budesonide-formoterol INHALE TWO PUFFS INTO THE LUNGS TWICE A DAY   TYLENOL 500 MG tablet Generic drug:   acetaminophen Take 1,000 mg by mouth 2 (two) times daily as needed.            Durable Medical Equipment  (From admission, onward)        Start     Ordered   09/17/17 1320  For home use only DME oxygen  Once    Question Answer Comment  Mode or (Route) Nasal cannula   Liters per Minute 2   Frequency Continuous (stationary and portable oxygen unit needed)   Oxygen conserving device Yes   Oxygen delivery system Gas      09/17/17 1319       DISCHARGE INSTRUCTIONS:   Follow-up with primary care physician in 3-5  days or sooner as needed\continue Continue home health and oxygen 2 L via nasal cannula  DIET:  Cardiac diet  DISCHARGE CONDITION:  Stable  ACTIVITY:  Activity as tolerated  OXYGEN:  Home Oxygen: Yes.     Oxygen Delivery: 2 liters/min via Patient connected to nasal cannula oxygen  DISCHARGE LOCATION:  home   If you experience worsening of your admission symptoms, develop shortness of breath, life threatening emergency, suicidal or homicidal thoughts you must seek medical attention immediately by calling 911 or calling your MD immediately  if symptoms less severe.  You Must read complete instructions/literature along with all the possible adverse reactions/side effects for all the Medicines you take and that have been prescribed to you. Take any new Medicines after you have completely understood and accpet all the possible adverse reactions/side effects.   Please note  You were cared for by a hospitalist during your hospital stay. If you have any questions about your discharge medications or the care you received while you were in the hospital after you are discharged, you can call the unit and asked to speak with the hospitalist on call if the hospitalist that took care of you is not available. Once you are discharged, your primary care physician will handle any further medical issues. Please note that NO REFILLS for any discharge medications will be  authorized once you are discharged, as it is imperative that you return to your primary care physician (or establish a relationship with a primary care physician if you do not have one) for your aftercare needs so that they can reassess your need for medications and monitor your lab values.     Today  Chief Complaint  Patient presents with  . Shortness of Breath   Patient denies any chest tightness or shortness of breath.  Patient and family members are requesting to reduce her steroid dose from 30mg  to 20 mg as patient is feeling very anxious  ROS:  CONSTITUTIONAL: Denies fevers, chills. Denies any fatigue, weakness.  EYES: Denies blurry vision, double vision, eye pain. EARS, NOSE, THROAT: Denies tinnitus, ear pain, hearing loss. RESPIRATORY: Denies cough, wheeze, shortness of breath.  CARDIOVASCULAR: Denies chest pain, palpitations, edema.  GASTROINTESTINAL: Denies nausea, vomiting, diarrhea, abdominal pain. Denies bright red blood per rectum. GENITOURINARY: Denies dysuria, hematuria. ENDOCRINE: Denies nocturia or thyroid problems. HEMATOLOGIC AND LYMPHATIC: Denies easy bruising or bleeding. SKIN: Denies rash or lesion. MUSCULOSKELETAL: Denies pain in neck, back, shoulder, knees, hips or arthritic symptoms.  NEUROLOGIC: Denies paralysis, paresthesias.  PSYCHIATRIC:  anxious but denies depressive symptoms.   VITAL SIGNS:  Blood pressure (!) 141/73, pulse 89, temperature 98.2 F (36.8 C), temperature source Oral, resp. rate 20, height 5\' 2"  (1.575 m), weight 56.2 kg (123 lb 14.4 oz), SpO2 98 %.  I/O:    Intake/Output Summary (Last 24 hours) at 09/17/2017 1529 Last data filed at 09/17/2017 1337 Gross per 24 hour  Intake 480 ml  Output 651 ml  Net -171 ml    PHYSICAL EXAMINATION:  GENERAL:  82 y.o.-year-old patient lying in the bed with no acute distress.  EYES: Pupils equal, round, reactive to light and accommodation. No scleral icterus. Extraocular muscles intact.  HEENT:  Head atraumatic, normocephalic. Oropharynx and nasopharynx clear.  NECK:  Supple, no jugular venous distention. No thyroid enlargement, no tenderness.  LUNGS: Mod  breath sounds bilaterally, no wheezing, rales,rhonchi or crepitation. No use of accessory muscles of respiration.  CARDIOVASCULAR: S1, S2 normal. No murmurs, rubs, or gallops.  ABDOMEN: Soft,  non-tender, non-distended. Bowel sounds present. No organomegaly or mass.  EXTREMITIES: No pedal edema, cyanosis, or clubbing.  NEUROLOGIC: Cranial nerves II through XII are intact. Muscle strength 5/5 in all extremities. Sensation intact. Gait not checked.  PSYCHIATRIC: The patient is alert and oriented x 3.  SKIN: No obvious rash, lesion, or ulcer.   DATA REVIEW:   CBC Recent Labs  Lab 09/16/17 0455  WBC 6.4  HGB 9.8*  HCT 29.1*  PLT 269    Chemistries  Recent Labs  Lab 09/15/17 1742 09/16/17 0455  NA 139 141  K 3.9 3.5  CL 104 105  CO2 23 24  GLUCOSE 104* 101*  BUN 20 20  CREATININE 0.98 1.06*  CALCIUM 9.7 9.4  AST 28  --   ALT 23  --   ALKPHOS 73  --   BILITOT 0.4  --     Cardiac Enzymes Recent Labs  Lab 09/16/17 1147  [place] <0.03    Microbiology Results  Results for orders placed or performed during the hospital encounter of 09/12/17  Blood Culture (routine x 2)     Status: Abnormal   Collection Time: 09/12/17  5:44 PM  Result Value Ref Range Status   Specimen Description   Final    BLOOD RIGHT ANTECUBITAL Performed at [facility], [address], [place], [place] [address]    Special Requests   Final    BOTTLES DRAWN AEROBIC AND ANAEROBIC Blood Culture adequate volume Performed at [facility], [address], [place], [place] [address]    Culture  Setup Time   Final    GRAM POSITIVE COCCI ANAEROBIC BOTTLE ONLY CRITICAL VALUE NOTED.  VALUE IS CONSISTENT WITH PREVIOUSLY REPORTED AND CALLED VALUE. Performed at [facility], [address] [place], [place]  [address]    Culture (A)  Final    STREPTOCOCCUS PNEUMONIAE SUSCEPTIBILITIES PERFORMED ON PREVIOUS CULTURE WITHIN THE LAST 5 DAYS. Performed at [facility], [place] [address], [place], [place] [address]    Report Status 09/15/2017 FINAL  Final  Blood Culture (routine x 2)     Status: Abnormal   Collection Time: 09/12/17  5:49 PM  Result Value Ref Range Status   Specimen Description   Final    BLOOD LEFT ANTECUBITAL Performed at [facility], [address] [place], [place] [address]    Special Requests   Final    BOTTLES DRAWN AEROBIC AND ANAEROBIC Blood Culture adequate volume Performed at [facility], [address] [place], [place] [address]    Culture  Setup Time   Final    GRAM POSITIVE COCCI ANAEROBIC BOTTLE ONLY CRITICAL RESULT CALLED TO, READ BACK BY AND VERIFIED WITH: CH[place]OUDAS [place] [id number] 09/13/17 SDR    Culture STREPTOCOCCUS PNEUMONIAE (A)  Final   Report Status 09/15/2017 FINAL  Final   Organism ID, Bacteria STREPTOCOCCUS PNEUMONIAE  Final      Susceptibility   Streptococcus pneumoniae - MIC*    ERYTHROMYCIN <=0.12 SENSITIVE Sensitive     LEVOFLOXACIN 0.5 SENSITIVE Sensitive     PENICILLIN (meningitis) <=0.06 SENSITIVE Sensitive     PENICILLIN (non-meningitis) <=0.06 SENSITIVE Sensitive     CEFTRIAXONE (non-meningitis) <=0.12 SENSITIVE Sensitive     CEFTRIAXONE (meningitis) <=0.12 SENSITIVE Sensitive     * STREPTOCOCCUS PNEUMONIAE  Blood Culture ID Panel (Reflexed)     Status: Abnormal   Collection Time: 09/12/17  5:49 PM  Result Value Ref Range Status   Enterococcus species NOT DETECTED NOT  DETECTED Final   Listeria monocytogenes NOT DETECTED NOT DETECTED Final   Staphylococcus species NOT DETECTED NOT DETECTED Final   Staphylococcus aureus NOT DETECTED NOT DETECTED Final   Streptococcus species DETECTED (A) NOT DETECTED Final    Comment: CRITICAL RESULT CALLED TO, READ BACK BY AND VERIFIED WITH:  CHRISTINE KATSOUDAS AT 7846  09/13/17 SDR    Streptococcus agalactiae NOT DETECTED NOT DETECTED Final   Streptococcus pneumoniae DETECTED (A) NOT DETECTED Final    Comment: CRITICAL RESULT CALLED TO, READ BACK BY AND VERIFIED WITH: CHRISTINE KATSOUDAS AT 9629 09/13/17 SDR    Streptococcus pyogenes NOT DETECTED NOT DETECTED Final   Acinetobacter baumannii NOT DETECTED NOT DETECTED Final   Enterobacteriaceae species NOT DETECTED NOT DETECTED Final   Enterobacter cloacae complex NOT DETECTED NOT DETECTED Final   Escherichia coli NOT DETECTED NOT DETECTED Final   Klebsiella oxytoca NOT DETECTED NOT DETECTED Final   Klebsiella pneumoniae NOT DETECTED NOT DETECTED Final   Proteus species NOT DETECTED NOT DETECTED Final   Serratia marcescens NOT DETECTED NOT DETECTED Final   Haemophilus influenzae NOT DETECTED NOT DETECTED Final   Neisseria meningitidis NOT DETECTED NOT DETECTED Final   Pseudomonas aeruginosa NOT DETECTED NOT DETECTED Final   Candida albicans NOT DETECTED NOT DETECTED Final   Candida glabrata NOT DETECTED NOT DETECTED Final   Candida krusei NOT DETECTED NOT DETECTED Final   Candida parapsilosis NOT DETECTED NOT DETECTED Final   Candida tropicalis NOT DETECTED NOT DETECTED Final    Comment: Performed at [facility], [address] [place], [place] [address]    RADIOLOGY:  Dg Chest 2 View  Result Date: 09/15/2017 CLINICAL DATA:  Shortness of Breath EXAM: CHEST  2 VIEW COMPARISON:  September 12, 2017 FINDINGS: There is airspace consolidation consistent with pneumonia in the anterior segment of the right upper lobe, to a lesser extent in the posterior segment of the right upper lobe, and in the right lower lobe posteriorly and laterally. Opacity in the anterior segment of the right upper lobe and right base regions is stable. There is mild new consolidation in a portion of the posterior segment right upper lobe. The left lung is clear. Heart size and pulmonary vascular normal. No adenopathy. There is  aortic atherosclerosis. No bone lesions. There is a loop recorder on the left anteriorly. IMPRESSION: Areas of infiltrate consistent with pneumonia in the right upper lobe and right base. Small focus of new infiltrate in the posterior segment right upper lobe. Other areas of infiltrate appear stable. Left lung clear. Stable cardiac silhouette. There is aortic atherosclerosis. Aortic Atherosclerosis (ICD10-I70.0). Electronically Signed   By: Lowella Grip III M.D.   On: 09/15/2017 19:37    EKG:   Orders placed or performed during the hospital encounter of 09/15/17  . ED EKG  . ED EKG      Management plans discussed with the patient, family and they are in agreement.  CODE STATUS:     Code Status Orders  (From admission, onward)        Start     Ordered   09/15/17 2257  Do not attempt resuscitation (DNR)  Continuous    Question Answer Comment  In the event of cardiac or respiratory ARREST Do not call a "code blue"   In the event of cardiac or respiratory ARREST Do not perform Intubation, CPR, defibrillation or ACLS   In the event of cardiac or respiratory ARREST Use medication by any route, position, wound care, and other measures  to relive pain and suffering. May use oxygen, suction and manual treatment of airway obstruction as needed for comfort.   Comments nurse may pronounce      09/15/17 2256    Code Status History    Date Active Date Inactive Code Status Order ID Comments User Context   09/12/2017 18:09 09/15/2017 15:48 DNR [id number]  Loletha Grayer, MD ED   08/17/2016 21:54 08/22/2016 21:51 DNR [id number]  Karmen Bongo, MD Inpatient   07/19/2016 21:14 07/25/2016 19:23 Full Code [id number]  Nicholes Mango, MD Inpatient   07/19/2016 17:02 07/19/2016 21:14 Full Code [id number]  Nicholes Mango, MD ED    Advance Directive Documentation     Most Recent Value  Type of Advance Directive  Healthcare Power of Attorney  Pre-existing out of facility DNR order (yellow form or pink MOST  form)  Yellow form placed in chart (order not valid for inpatient use)  "MOST" Form in Place?  No data      TOTAL TIME TAKING CARE OF THIS PATIENT: 43  minutes.   Note: This dictation was prepared with Dragon dictation along with smaller phrase technology. Any transcriptional errors that result from this process are unintentional.   @[facility] @  on 09/17/2017 at 3:29 PM  Between 7am to 6pm - Pager - [phone]  After 6pm go to [email or web address] - password [email or web address]  Office  [phone]  CC: Primary care physician; Margo Common, PA

## 2017-09-17 NOTE — Discharge Instructions (Signed)
Follow-up with primary care physician in 3-5 days or sooner as needed\continue Continue home health and oxygen 2 L via nasal cannula

## 2017-09-17 NOTE — Progress Notes (Signed)
Pt home today with family, VSS, home oxygen in room with pt family educated and understanding proper use of equipment, prescriptions, and discharge instructions. Follow up appointments given. Home health will be to see patient.

## 2017-09-17 NOTE — Progress Notes (Signed)
SATURATION QUALIFICATIONS: (This note is used to comply with regulatory documentation for home oxygen)  Patient Saturations on Room Air at Rest = 93%  Patient Saturations on Room Air while Ambulating = 85%  Patient Saturations on 2.5 Liters of oxygen while Ambulating = 94%  Please briefly explain why patient needs home oxygen:pt desats quickly on room air with minimal ambulation.

## 2017-09-17 NOTE — Evaluation (Signed)
Physical Therapy Evaluation Patient Details Name: Stephanie [name] MRN: [id number] DOB: 06-01-1936 Today's Date: 09/17/2017   History of Present Illness  82  yo female with onset of PNA was readmitted after unsuccessful trip home.  Pt had gone with no O2 and apparently with COPD had worsened her tolerance for activity in 24 hours.  PMHx:  COPD, depression, CVA, dysarthria, CAD, sepsis, PNA  Clinical Impression  Pt was seen for assessment of mobiltiy with O2 use: SATURATION QUALIFICATIONS: (This note is used to comply with regulatory documentation for home oxygen)  Patient Saturations on Room Air at Rest = NT%  Patient Saturations on Room Air while Ambulating = 85%  Patient Saturations on 2 Liters of oxygen while Ambulating = 88%  Please briefly explain why patient needs home oxygen:  Even on 2L is requiring minimum to maintain 88% or better saturation.  93% at rest on O2.    Follow Up Recommendations Home health PT;Supervision/Assistance - 24 hour    Equipment Recommendations  None recommended by PT    Recommendations for Other Services       Precautions / Restrictions Precautions Precautions: Fall Precaution Comments: requires O2 Restrictions Weight Bearing Restrictions: No      Mobility  Bed Mobility Overal bed mobility: Modified Independent             General bed mobility comments: reminders to wait to stand up  Transfers Overall transfer level: Modified independent Equipment used: None             General transfer comment: able to reach to chair and transition safely  Ambulation/Gait Ambulation/Gait assistance: Supervision Ambulation Distance (Feet): 10 Feet Assistive device: None Gait Pattern/deviations: Step-through pattern;Decreased stride length;Narrow base of support        Stairs Stairs: Yes Stairs assistance: Supervision;Min guard Stair Management: One rail Right;Forwards;Alternating pattern Number of Stairs: 14 General stair  comments: pt was taken on stairs to see how O2 was controlled and noted her sats were 93% at rest and after walk as low as 88% on 2L O2  Wheelchair Mobility    Modified Rankin (Stroke Patients Only)       Balance Overall balance assessment: Modified Independent                                           Pertinent Vitals/Pain Pain Assessment: No/denies pain    Home Living Family/patient expects to be discharged to:: Private residence Living Arrangements: Alone Available Help at Discharge: Family;Available PRN/intermittently Type of Home: House Home Access: Stairs to enter     Home Layout: Two level Home Equipment: None Additional Comments: pt cannot give details and daughter answers for her    Prior Function Level of Independence: Independent               Hand Dominance        Extremity/Trunk Assessment   Upper Extremity Assessment Upper Extremity Assessment: Overall WFL for tasks assessed    Lower Extremity Assessment Lower Extremity Assessment: Overall WFL for tasks assessed    Cervical / Trunk Assessment Cervical / Trunk Assessment: Kyphotic  Communication   Communication: Expressive difficulties  Cognition Arousal/Alertness: Awake/alert Behavior During Therapy: Impulsive Overall Cognitive Status: Difficult to assess  General Comments: pt has clear safety issues of awareness of her limits      General Comments      Exercises     Assessment/Plan    PT Assessment Patient needs continued PT services  PT Problem List Decreased strength;Decreased activity tolerance;Decreased balance;Decreased mobility;Decreased cognition;Decreased safety awareness;Cardiopulmonary status limiting activity       PT Treatment Interventions DME instruction;Gait training;Stair training;Functional mobility training;Therapeutic activities;Therapeutic exercise;Balance training;Neuromuscular  re-education;Patient/family education    PT Goals (Current goals can be found in the Care Plan section)  Acute Rehab PT Goals Patient Stated Goal: unable to state PT Goal Formulation: With patient Time For Goal Achievement: 10/01/17 Potential to Achieve Goals: Good    Frequency Min 2X/week   Barriers to discharge Decreased caregiver support home alone    Co-evaluation               AM-PAC PT "6 Clicks" Daily Activity  Outcome Measure Difficulty turning over in bed (including adjusting bedclothes, sheets and blankets)?: None Difficulty moving from lying on back to sitting on the side of the bed? : None Difficulty sitting down on and standing up from a chair with arms (e.g., wheelchair, bedside commode, etc,.)?: None Help needed moving to and from a bed to chair (including a wheelchair)?: A Little Help needed walking in hospital room?: A Little Help needed climbing 3-5 steps with a railing? : A Little 6 Click Score: 21    End of Session Equipment Utilized During Treatment: Gait belt Activity Tolerance: Patient tolerated treatment well Patient left: in bed;with call bell/phone within reach;with family/visitor present Nurse Communication: Mobility status(O2 sats) PT Visit Diagnosis: Unsteadiness on feet (R26.81);Muscle weakness (generalized) (M62.81);Difficulty in walking, not elsewhere classified (R26.2)    Time: 9794-8016 PT Time Calculation (min) (ACUTE ONLY): 38 min   Charges:   PT Evaluation $PT Eval Moderate Complexity: 1 Mod PT Treatments $Gait Training: 8-22 mins $Therapeutic Activity: 8-22 mins   PT G Codes:   PT G-Codes **NOT FOR INPATIENT CLASS** Functional Assessment Tool Used: AM-PAC 6 Clicks Basic Mobility    Ramond Dial 09/17/2017, 2:16 PM   Mee Hives, PT MS Acute Rehab Dept. Number: [place] and [place]

## 2017-09-17 NOTE — Care Management Note (Signed)
Case Management Note  Patient Details  Name: Clemma Johnsen MRN: [id number] Date of Birth: June 20, 1936  Subjective/Objective:    Tiffany at [facility] called per [facility] cannot accept [facility] insurance. Discussed with Mrs Sides daughter who agreed upon [place]. A referral for HH=PT, SW, Resp care was sent to [facility] with request from daughter that PT see Mrs Zaucha tomorrow if possible.                Action/Plan:   Expected Discharge Date:  09/17/17               Expected Discharge Plan:  [place]  In-House Referral:     Discharge planning Services  CM Consult  Post Acute Care Choice:    Choice offered to:  Patient  DME Arranged:  Oxygen DME Agency:  [place]:  PT, Social Work [employer] Agency:  [facility]  Status of Service:  Completed, signed off  If discussed at [employer], dates discussed:    Additional Comments:  Irasema Chalk A, RN 09/17/2017, 3:01 PM

## 2017-09-17 NOTE — Care Management Note (Signed)
Case Management Note  Patient Details  Name: Jannetta Massey MRN: [id number] Date of Birth: 07-01-1936  Subjective/Objective:    A referral for new home 02 was called to Melene Muller at [facility] will deliver a portable 02 tank to Mrs [facility] room prior to discharge today. Call to Ryan at [facility] requesting home health PT, SW, respiratory care.                 Action/Plan:   Expected Discharge Date:  09/17/17               Expected Discharge Plan:  [place]  In-House Referral:     Discharge planning Services  CM Consult  Post Acute Care Choice:    Choice offered to:  Patient  DME Arranged:  Oxygen DME Agency:  [place]:  PT, Social Work [employer] Agency:  [facility]  Status of Service:  Completed, signed off  If discussed at [employer], dates discussed:    Additional Comments:  Khoi Hamberger A, RN 09/17/2017, 1:50 PM

## 2017-09-17 NOTE — Progress Notes (Signed)
PHARMACIST - PHYSICIAN COMMUNICATION  CONCERNING: Antibiotic IV to Oral Route Change Policy  RECOMMENDATION: This patient is receiving LEVAQUIN by the intravenous route.  Based on criteria approved by the Pharmacy and Therapeutics Committee, the antibiotic(s) is/are being converted to the equivalent oral dose form(s).   DESCRIPTION: These criteria include:  Patient being treated for a respiratory tract infection, urinary tract infection, cellulitis or clostridium difficile associated diarrhea if on metronidazole  The patient is not neutropenic and does not exhibit a GI malabsorption state  The patient is eating (either orally or via tube) and/or has been taking other orally administered medications for a least 24 hours  The patient is improving clinically and has a Tmax < 100.5  If you have questions about this conversion, please contact the Pharmacy Department  []   [phone] )  Stephanie [name] [x]   [phone] )  [facility] []   [phone] )  [facility] []   [phone] )  [facility] []   [phone] )  [place] PharmD Clinical Pharmacist 09/17/2017

## 2017-09-17 NOTE — Plan of Care (Signed)
  Progressing Clinical Measurements: Ability to maintain clinical measurements within normal limits will improve 09/17/2017 1830 - Progressing by Alen Blew, RN Will remain free from infection 09/17/2017 1830 - Progressing by Alen Blew, RN Diagnostic test results will improve 09/17/2017 1830 - Progressing by Alen Blew, RN Respiratory complications will improve 09/17/2017 1830 - Progressing by Alen Blew, RN Cardiovascular complication will be avoided 09/17/2017 1830 - Progressing by Roanna Epley D, RN Activity: Risk for activity intolerance will decrease 09/17/2017 1830 - Progressing by Alen Blew, RN Nutrition: Adequate nutrition will be maintained 09/17/2017 1830 - Progressing by Roanna Epley D, RN Coping: Level of anxiety will decrease 09/17/2017 1830 - Progressing by Alen Blew, RN Elimination: Will not experience complications related to bowel motility 09/17/2017 1830 - Progressing by Alen Blew, RN Will not experience complications related to urinary retention 09/17/2017 1830 - Progressing by Alen Blew, RN Pain Managment: General experience of comfort will improve 09/17/2017 1830 - Progressing by Alen Blew, RN Safety: Ability to remain free from injury will improve 09/17/2017 1830 - Progressing by Roanna Epley D, RN Skin Integrity: Risk for impaired skin integrity will decrease 09/17/2017 1830 - Progressing by Alen Blew, RN Respiratory: Ability to maintain a clear airway will improve 09/17/2017 1830 - Progressing by Alen Blew, RN Levels of oxygenation will improve 09/17/2017 1830 - Progressing by Alen Blew, RN Ability to maintain adequate ventilation will improve 09/17/2017 1830 - Progressing by Alen Blew, RN

## 2017-09-18 ENCOUNTER — Telehealth: Payer: Self-pay

## 2017-09-18 NOTE — Telephone Encounter (Signed)
Transition Care Management Follow-Up Telephone Call   Date discharged and where: [facility] on 09/17/17  How have you been since you were released from the hospital? Doing better, on O2 and getting set up with home health. Breathing is better with oxygen. Some shortness of breath with stairs but overall breathing is better.   Any patient concerns? None.  Items Reviewed:   Meds: verified  Allergies: verified  Dietary Changes Reviewed: N/A  Functional Questionnaire:  Independent-I Dependent-D  ADLs:   Dressing- I    Eating- I   Maintaining continence- I   Transferring- I   Transportation- D   Meal Prep- Needs assistance   Managing Meds- D  Confirmed importance and Date/Time of follow-up visits scheduled: 09/22/17 @ 11 AM   Confirmed with patient if condition worsens to call PCP or go to the Emergency Dept. Patient was given office number and encouraged to call back with questions or concerns: YES

## 2017-09-18 NOTE — Telephone Encounter (Signed)
Butch Penny was advised.

## 2017-09-18 NOTE — Telephone Encounter (Signed)
Which medication? Was it one of her inhalers or does she have a nebulizer at home? Tried to call the daughter but had to leave a message to call back.

## 2017-09-19 ENCOUNTER — Other Ambulatory Visit: Payer: Self-pay | Admitting: Family Medicine

## 2017-09-19 ENCOUNTER — Telehealth: Payer: Self-pay | Admitting: *Deleted

## 2017-09-19 DIAGNOSIS — J441 Chronic obstructive pulmonary disease with (acute) exacerbation: Secondary | ICD-10-CM

## 2017-09-19 DIAGNOSIS — J449 Chronic obstructive pulmonary disease, unspecified: Secondary | ICD-10-CM | POA: Diagnosis not present

## 2017-09-19 DIAGNOSIS — J45901 Unspecified asthma with (acute) exacerbation: Principal | ICD-10-CM

## 2017-09-19 MED ORDER — IPRATROPIUM-ALBUTEROL 0.5-2.5 (3) MG/3ML IN SOLN: 3.0000 mL | RESPIRATORY_TRACT | 0 refills | Status: DC | PRN

## 2017-09-19 NOTE — Telephone Encounter (Signed)
Patient contacted regarding discharge from [facility] on 09/17/17.   Patient's daughter Butch Penny) , ok per DPR,  understands to follow up with provider ? On 09/27/17 at 11:30am at [facility].  Patient's daughter understands discharge instructions? Yes  Patient's daughter understands medications and regiment? Yes  Patient's daughter understands to bring all medications to this visit? Yes

## 2017-09-19 NOTE — Telephone Encounter (Signed)
It was the medication for the nebulizer. She does have a nebulizer at home.  Thanks, MM.

## 2017-09-19 NOTE — Telephone Encounter (Signed)
-----   Message from Blain Pais sent at 09/19/2017 11:57 AM EST ----- Regarding: TCM/PH 2/20 11:30 Murray Hodgkins, NP

## 2017-09-19 NOTE — Telephone Encounter (Signed)
Thank you! Daughter advised. -MM

## 2017-09-19 NOTE — Telephone Encounter (Signed)
Duoneb solution by nebulizer was given during the hospitalization on 09-12-17. Sent prescription to the [facility] in [place].

## 2017-09-22 ENCOUNTER — Ambulatory Visit (INDEPENDENT_AMBULATORY_CARE_PROVIDER_SITE_OTHER): Payer: PPO | Admitting: Family Medicine

## 2017-09-22 ENCOUNTER — Telehealth: Payer: Self-pay | Admitting: Family Medicine

## 2017-09-22 ENCOUNTER — Encounter: Payer: Self-pay | Admitting: Family Medicine

## 2017-09-22 VITALS — BP 132/68 | HR 86 | Temp 98.4°F | Wt 126.4 lb

## 2017-09-22 DIAGNOSIS — A403 Sepsis due to Streptococcus pneumoniae: Secondary | ICD-10-CM

## 2017-09-22 DIAGNOSIS — Z7951 Long term (current) use of inhaled steroids: Secondary | ICD-10-CM | POA: Diagnosis not present

## 2017-09-22 DIAGNOSIS — J189 Pneumonia, unspecified organism: Secondary | ICD-10-CM

## 2017-09-22 DIAGNOSIS — F419 Anxiety disorder, unspecified: Secondary | ICD-10-CM | POA: Diagnosis not present

## 2017-09-22 DIAGNOSIS — Z8673 Personal history of transient ischemic attack (TIA), and cerebral infarction without residual deficits: Secondary | ICD-10-CM | POA: Diagnosis not present

## 2017-09-22 DIAGNOSIS — I10 Essential (primary) hypertension: Secondary | ICD-10-CM | POA: Diagnosis not present

## 2017-09-22 DIAGNOSIS — J441 Chronic obstructive pulmonary disease with (acute) exacerbation: Secondary | ICD-10-CM | POA: Diagnosis not present

## 2017-09-22 DIAGNOSIS — Z955 Presence of coronary angioplasty implant and graft: Secondary | ICD-10-CM | POA: Diagnosis not present

## 2017-09-22 DIAGNOSIS — Z95818 Presence of other cardiac implants and grafts: Secondary | ICD-10-CM | POA: Diagnosis not present

## 2017-09-22 DIAGNOSIS — Z7901 Long term (current) use of anticoagulants: Secondary | ICD-10-CM | POA: Diagnosis not present

## 2017-09-22 DIAGNOSIS — I69322 Dysarthria following cerebral infarction: Secondary | ICD-10-CM | POA: Diagnosis not present

## 2017-09-22 DIAGNOSIS — Z87891 Personal history of nicotine dependence: Secondary | ICD-10-CM | POA: Diagnosis not present

## 2017-09-22 DIAGNOSIS — Z9981 Dependence on supplemental oxygen: Secondary | ICD-10-CM | POA: Diagnosis not present

## 2017-09-22 DIAGNOSIS — J440 Chronic obstructive pulmonary disease with acute lower respiratory infection: Secondary | ICD-10-CM | POA: Diagnosis not present

## 2017-09-22 DIAGNOSIS — F329 Major depressive disorder, single episode, unspecified: Secondary | ICD-10-CM | POA: Diagnosis not present

## 2017-09-22 DIAGNOSIS — I2510 Atherosclerotic heart disease of native coronary artery without angina pectoris: Secondary | ICD-10-CM | POA: Diagnosis not present

## 2017-09-22 NOTE — Telephone Encounter (Signed)
Verbal order given to Pankti with [facility].

## 2017-09-22 NOTE — Telephone Encounter (Signed)
Verbal order for Two week two and re-evaluate for need in two weeks.

## 2017-09-22 NOTE — Telephone Encounter (Signed)
Pankti from [place] [facility] is calling for verbal orders for physical therapy as follows  One week one  Two week two  After two weeks reevaluate to see if she needs anything else.

## 2017-09-22 NOTE — Progress Notes (Signed)
Patient: Stephanie [name] Female    DOB: 30-Aug-1935   82 y.o.   MRN: [id number] Visit Date: 09/22/2017  Today's Provider: Vernie Murders, PA   Chief Complaint  Patient presents with  . Hospitalization Follow-up   Subjective:    HPI  Follow up Hospitalization  Patient was admitted to [facility] on 09/12/16 and discharged on 09/15/17 and readmitted on 09/15/17 and discharged on 09/17/17. She was treated for acute exacerbation of COPD and Sepsis. Treatment for this included started Z-pak, Cefin, Tessalon Pearle,Prednisone, Benadryl, and Robitussin. Patient has a portable O2 tank. Telephone follow up was done on 09/18/17 She reports good compliance with treatment. She reports this condition is improved.  ------------------------------------------------------------------------------------ Past Medical History:  Diagnosis Date  . Arthritis   . Complication of anesthesia   . COPD (chronic obstructive pulmonary disease) ([place])   . Coronary artery disease 2013   stent  . Depression   . Edema   . Pneumonia    IN PAST  . PONV (postoperative nausea and vomiting)   . Stroke [facility])    Patient Active Problem List   Diagnosis Date Noted  . CAP (community acquired pneumonia) 09/15/2017  . Sepsis ([name]) 09/12/2017  . History of stroke 09/06/2017  . Essential hypertension 04/14/2017  . PFO (patent foramen ovale)   . DVT (deep venous thrombosis) ([place])   . Cerebrovascular accident (CVA) due to embolism of left middle cerebral artery ([place])   . Expressive aphasia   . Elevated troponin 08/18/2016  . Mild malnutrition ([place]) 08/18/2016  . Acute CVA (cerebrovascular accident) ([place]) 08/17/2016  . Acute exacerbation of chronic obstructive pulmonary disease (COPD) ([place]) 07/19/2016  . Depression 05/03/2016  . Mild dementia 04/19/2016  . COPD, moderate ([place]) 03/05/2015  . Anxiety and depression 03/04/2015  . Arteriosclerosis of coronary artery 03/04/2015  . CAFL (chronic airflow limitation)  ([place]) 03/04/2015  . Breathlessness on exertion 03/04/2015  . HLD (hyperlipidemia) 03/04/2015  . Osteopenia 03/04/2015  . Peptic ulcer 03/04/2015  . Amnesia 03/20/2014  . Disordered sleep 03/20/2014  . Allergy to environmental factors 11/14/2013  . Gastroduodenal ulcer 11/14/2013  . GI bleed 11/14/2013   Past Surgical History:  Procedure Laterality Date  . BALLOON ANGIOPLASTY, ARTERY  05/17/2009  . CATARACT EXTRACTION W/PHACO Left 05/24/2016   Procedure: CATARACT EXTRACTION PHACO AND INTRAOCULAR LENS PLACEMENT (IOC);  Surgeon: Birder Robson, MD;  Location: [facility] ORS;  Service: Ophthalmology;  Laterality: Left;  Lot# [id number] H [place]:   00:52.3 AP%:   27.3 CDE:  14.26   . CATARACT EXTRACTION W/PHACO Right 06/14/2016   Procedure: CATARACT EXTRACTION PHACO AND INTRAOCULAR LENS PLACEMENT (IOC);  Surgeon: Birder Robson, MD;  Location: [facility] ORS;  Service: Ophthalmology;  Laterality: Right;  Lot# [id number] H [place]: 01:13.4 AP%: 24.4 CDE: 17.85  . CORONARY ANGIOPLASTY     STENT  . EP IMPLANTABLE DEVICE N/A 08/19/2016   Procedure: Loop Recorder Insertion;  Surgeon: Will Meredith Leeds, MD;  Location: [place] [facility];  Service: Cardiovascular;  Laterality: N/A;   Family History  Problem Relation Age of Onset  . Hypertension Mother   . Diabetes Mother   . Pancreatic cancer Mother 38  . Cancer Mother   . Suicidality Father 20  . Hypertension Father   . Alcohol abuse Father   . Hypertension Sister   . Heart disease Sister   . Kidney disease Sister   . Varicose Veins Sister   . Parkinson's disease Sister    Allergies  Allergen Reactions  .  Duloxetine Hcl     unknown  . Influenza Vaccines     unknown  . Venlafaxine     GI distress    Current Outpatient Medications:  .  acetaminophen (TYLENOL) 500 MG tablet, Take 1,000 mg by mouth 2 (two) times daily as needed. , Disp: , Rfl:  .  albuterol (VENTOLIN HFA) 108 (90 Base) MCG/ACT inhaler, Inhale 2 puffs into the lungs every 6 (six)  hours as needed., Disp: 3 Inhaler, Rfl: 3 .  amLODipine (NORVASC) 5 MG tablet, Take 5 mg by mouth daily., Disp: , Rfl:  .  benzonatate (TESSALON) 100 MG capsule, Take 1 capsule (100 mg total) by mouth 3 (three) times daily., Disp: 20 capsule, Rfl: 0 .  Calcium Carb-Cholecalciferol (CALCIUM 600+D) 600-800 MG-UNIT TABS, Take 2 tablets by mouth daily., Disp: , Rfl:  .  cetirizine (ZYRTEC) 10 MG tablet, Take 10 mg by mouth daily., Disp: , Rfl:  .  clonazePAM (KLONOPIN) 0.5 MG tablet, TAKE 1 TABLET BY MOUTH TWICE DAILY AS NEEDED FOR ANXIETY (Patient taking differently: TAKE 1 TABLET (0.5MG ) BY MOUTH AT BEDTIME), Disp: 180 tablet, Rfl: 5 .  diphenhydrAMINE (BENADRYL) 25 mg capsule, Take 1 capsule (25 mg total) by mouth at bedtime as needed for sleep., Disp: 30 capsule, Rfl: 0 .  docusate sodium (COLACE) 250 MG capsule, Take 250 mg by mouth at bedtime. , Disp: , Rfl:  .  ELIQUIS 5 MG TABS tablet, TAKE ONE (1) TABLET BY MOUTH TWO (2) TIMES DAILY, Disp: 180 tablet, Rfl: 3 .  gabapentin (NEURONTIN) 100 MG capsule, TAKE 1 CAPSULE BY MOUTH THREE TIMES A DAY (Patient taking differently: TAKE 1 CAPSULE (100MG ) BY MOUTH TWICE DAILY), Disp: 90 capsule, Rfl: 3 .  guaiFENesin-dextromethorphan (ROBITUSSIN DM) 100-10 MG/5ML syrup, Take 5 mLs by mouth every 4 (four) hours as needed for cough., Disp: 118 mL, Rfl: 0 .  ipratropium-albuterol (DUONEB) 0.5-2.5 (3) MG/3ML SOLN, Take 3 mLs by nebulization every 4 (four) hours as needed., Disp: 360 mL, Rfl: 0 .  PARoxetine (PAXIL) 10 MG tablet, Take 1-2 tablets (10-20 mg total) by mouth daily. Take 1 tablet (10MG ) in the morning and 2 tablets (20MG ) at bedtime (Patient taking differently: Take 10 mg by mouth daily. Take 1 tablet (10MG ) in the morning and 2 tablets (20MG ) at bedtime), Disp: , Rfl:  .  prednisoLONE 5 MG TABS tablet, Take 1 tablet (5 mg total) by mouth daily. Label  & dispense according to the schedule below: 4   Pills PO for 3 days, followed by 2   Pills PO for 3  days, followed by 1   Pills PO for 3 days then STOP., Disp: 21 tablet, Rfl: 0 .  predniSONE (DELTASONE) 5 MG tablet, TAKE 4 TABLETS BY MOUTH FOR 3 DAYS THEN 2 TABLETS FOR 3 DAYS THEN 1 TABLET FOR 3 DAYS, Disp: , Rfl: 0 .  ranitidine (ZANTAC 75) 75 MG tablet, Take 0.5 tablets (37.5 mg total) by mouth 2 (two) times daily., Disp: 30 tablet, Rfl: 2 .  Spacer/Aero Chamber Mouthpiece MISC, Use spacer on inhalers for better administration., Disp: 1 each, Rfl: 1 .  SPIRIVA HANDIHALER 18 MCG inhalation capsule, INHALE CONTENTS OF ONE CAPSULE ONCE A DAY AS DIRECTED, Disp: 90 capsule, Rfl: 4 .  SYMBICORT 160-4.5 MCG/ACT inhaler, INHALE TWO PUFFS INTO THE LUNGS TWICE A DAY, Disp: 30.6 g, Rfl: 3  Review of Systems  Constitutional: Negative.   Respiratory: Negative.   Cardiovascular: Negative.     Social History  Tobacco Use  . Smoking status: Former Smoker    Last attempt to quit: 08/07/2001    Years since quitting: 16.1  . Smokeless tobacco: Never Used  . Tobacco comment: QUIT IN 2002  Substance Use Topics  . Alcohol use: No    Alcohol/week: 0.0 oz   Objective:   BP 132/68 (BP Location: Right Arm, Patient Position: Sitting, Cuff Size: Normal)   Pulse 86   Temp 98.4 F (36.9 C) (Oral)   Wt 126 lb 6.4 oz (57.3 kg)   SpO2 96% Comment: 2 liters of O2  BMI 23.12 kg/m   Physical Exam  Constitutional: She appears well-developed and well-nourished. No distress.  HENT:  Head: Normocephalic and atraumatic.  Right Ear: Hearing and external ear normal.  Left Ear: Hearing and external ear normal.  Nose: Nose normal.  Mouth/Throat: Oropharynx is clear and moist.  Eyes: Conjunctivae and lids are normal. Right eye exhibits no discharge. Left eye exhibits no discharge. No scleral icterus.  Neck: Neck supple.  Cardiovascular: Normal rate.  Pulmonary/Chest: Effort normal. No respiratory distress.  No wheezes, rales or rhonchi.  Abdominal: Soft. Bowel sounds are normal.  Musculoskeletal: Normal  range of motion.  Neurological:  Difficult to assess orientation due to expressive aphasia secondary to past CVA.  Skin: Skin is intact. No lesion and no rash noted.  Psychiatric: She has a normal mood and affect. Her speech is normal and behavior is normal. Thought content normal.      Assessment & Plan:     1. Acute exacerbation of chronic obstructive pulmonary disease (COPD) ([place]) Was hospitalized on 09-12-17 and discharged on 09-15-17 with diagnosis of CAP with sepsis. Began having more problems with breathing, 5-6 hours after discharge, and had to be readmitted with discharge on 09-17-17 for acute exacerbation of COPD. During treatment with steroid, she had hallucinations and near psychosis per family recollection. Discharged on home health PT with 2 LPM oxygen by nasal cannula and will follow up with cardiologist (Dr. Rockey Situ) because troponin was elevated (probably due to hypoxia with cardiac demand ischemia). Using very little Albuterol since starting the oxygen 24 hours a day. Continues to use the Spiriva qd, Symbicort 2 puffs BID and has Duoneb to be used in her nebulizer if wheezing/dyspnea flares beyond Albuterol capabilities. Will recheck labs and encouraged to proceed with present medications. - CBC with Differential/Platelet; Future - DG Chest 2 View; Future  2. Community acquired pneumonia, unspecified laterality Diagnosed during hospitalization 09-12-17 through 09-15-17. Feeling improved but strength slow to return. Encouraged to eat 3 meals a day and check labs with CXR in 2 weeks. - CBC with Differential/Platelet; Future - Comprehensive metabolic panel; Future - DG Chest 2 View; Future  3. Sepsis due to Streptococcus pneumoniae ([place]) Diagnosed and positive blood cultures during 09-12-17 hospitalization. Feeling much better and about to finish the post hospital Z-pak treatment from the 09-15-17 hospitalization. No fever, chills, etc. Eating well and will get follow up labs when she comes for  the follow up CXR to recheck CAP. - CBC with Differential/Platelet; Future - Comprehensive metabolic panel; Future  4. H/O: CVA (cerebrovascular accident) Stroke with residual expressive aphasia January 2018. Will proceed with labs and home health nurse visits. Family very attentive and helping with medications. - CBC with Differential/Platelet; Future - Comprehensive metabolic panel; Future       Vernie Murders, PA  [facility]

## 2017-09-27 ENCOUNTER — Encounter: Payer: Self-pay | Admitting: Nurse Practitioner

## 2017-09-27 ENCOUNTER — Telehealth: Payer: Self-pay | Admitting: Family Medicine

## 2017-09-27 ENCOUNTER — Ambulatory Visit: Payer: PPO | Admitting: Nurse Practitioner

## 2017-09-27 VITALS — BP 110/60 | HR 87 | Ht 62.0 in | Wt 120.5 lb

## 2017-09-27 DIAGNOSIS — I2510 Atherosclerotic heart disease of native coronary artery without angina pectoris: Secondary | ICD-10-CM

## 2017-09-27 DIAGNOSIS — I10 Essential (primary) hypertension: Secondary | ICD-10-CM

## 2017-09-27 DIAGNOSIS — J449 Chronic obstructive pulmonary disease, unspecified: Secondary | ICD-10-CM | POA: Diagnosis not present

## 2017-09-27 NOTE — Telephone Encounter (Signed)
Please sign a DNR order for her.   EMS took the one she had and never returned it.  It needs to be posted in her apartment per Butch Penny, her daughter.  Please call Butch Penny when signed and she will come get it.

## 2017-09-27 NOTE — Progress Notes (Signed)
Office Visit    Patient Name: Stephanie [name] Date of Encounter: 09/27/2017  Primary Care Provider:  Margo Common, PA Primary Cardiologist:  Ida Rogue, MD  Chief Complaint    82 year old female with a prior history of CAD status post remote stenting, left MCA territory stroke in January 2018 in the setting of left lower extremity DVT and patent foramen ovale, and recent admission for respiratory failure and COPD, who presents for follow-up.  Past Medical History    Past Medical History:  Diagnosis Date  . Arthritis   . Baker's cyst of knee, right    a. 08/2016 U/S: Baker's cyst noted in R popliteal fossa.  . Complication of anesthesia   . COPD (chronic obstructive pulmonary disease) ([place])    a. Home O2 started 08/2017.  Marland Kitchen Coronary artery disease    a. prior h/o stenting ~ 2005.  Marland Kitchen Depression   . Edema   . History of cardiac monitoring    a. 08/2016 s/p LINQ placement following CVA. No AFib to date (09/2017).  Marland Kitchen History of pneumonia   . Hx of ischemic left MCA stroke    a. 08/2016 large posterior L MCA territory infarct in the setting of LLE DVT and suggestion of PFO on transcranial dopplers w/ bubble study (no PFO reported on echocardiogram)-->Eliquis.  . Left leg DVT ([place])    a. 08/2016 U/S: subacute DVT in prox L popliteal vein w/ chronic superficial thrombosis in the L greater saphenous vein from the mid to prox L calf-->Eliquis.  Marland Kitchen PFO (patent foramen ovale)    a. 08/2016 Abnl TCD w/ bubble study suggesting large PFO. Not reported on echocardiogram.  . PONV (postoperative nausea and vomiting)    Past Surgical History:  Procedure Laterality Date  . BALLOON ANGIOPLASTY, ARTERY  05/17/2009  . CATARACT EXTRACTION W/PHACO Left 05/24/2016   Procedure: CATARACT EXTRACTION PHACO AND INTRAOCULAR LENS PLACEMENT (IOC);  Surgeon: Birder Robson, MD;  Location: [facility] ORS;  Service: Ophthalmology;  Laterality: Left;  Lot# [id number] H [place]:   00:52.3 AP%:   27.3 CDE:   14.26   . CATARACT EXTRACTION W/PHACO Right 06/14/2016   Procedure: CATARACT EXTRACTION PHACO AND INTRAOCULAR LENS PLACEMENT (IOC);  Surgeon: Birder Robson, MD;  Location: [facility] ORS;  Service: Ophthalmology;  Laterality: Right;  Lot# [id number] H [place]: 01:13.4 AP%: 24.4 CDE: 17.85  . CORONARY ANGIOPLASTY     STENT  . EP IMPLANTABLE DEVICE N/A 08/19/2016   Procedure: Loop Recorder Insertion;  Surgeon: Will Meredith Leeds, MD;  Location: [facility];  Service: Cardiovascular;  Laterality: N/A;    Allergies  Allergies  Allergen Reactions  . Duloxetine Hcl     unknown  . Influenza Vaccines     unknown  . Venlafaxine     GI distress    History of Present Illness    82 year old female with the above complex past medical history including CAD status post remote stenting approximately 2005, anxiety, depression, and COPD.  In January 2018, she was admitted with stroke and found to have a left MCA territory stroke.  Echocardiogram showed normal LV function without documentation of PFO (does not appear that bubble study was performed).  TEE was unable to be performed because of altered mental status.  She was also found to have a left lower extremity DVT and transcranial Dopplers with bubble study was abnormal and did suggest a PFO.  In that setting, she was placed on Eliquis and has been on it ever since.  She  also underwent placement of a [employer]onic LINQ, which has been evaluated monthly and has never shown any evidence of atrial fibrillation.  She was recently admitted to [facility] in early February with acute respiratory failure and mild troponin elevation with a fairly nonspecific trend of 0.05  < 0.03  < 0.03  0.18  < 0.03  0.12  < 0.03.  Echocardiogram was performed and again showed normal LV function.  She was seen by our team with recommendation for consideration of outpatient stress testing following recovery.  She was subsequently discharged home with home O2.  Since then, she  has done reasonably well.  She does have chronic dyspnea on exertion when walking up stairs but this is improved some since adding oxygen at home.  She has not been experiencing any chest pain and further denies any palpitations, PND, orthopnea, dizziness, syncope, edema, or early satiety.  Home Medications    Prior to Admission medications   Medication Sig Start Date End Date Taking? Authorizing Provider  acetaminophen (TYLENOL) 500 MG tablet Take 1,000 mg by mouth 2 (two) times daily as needed.    Yes [provider]  albuterol (VENTOLIN HFA) 108 (90 Base) MCG/ACT inhaler Inhale 2 puffs into the lungs every 6 (six) hours as needed. 12/05/16  Yes Chrismon, Vickki Muff, PA  amLODipine (NORVASC) 5 MG tablet Take 5 mg by mouth daily.   Yes [provider]  benzonatate (TESSALON) 100 MG capsule Take 1 capsule (100 mg total) by mouth 3 (three) times daily. Patient taking differently: Take 100 mg by mouth 2 (two) times daily.  09/17/17  Yes Gouru, Illene Silver, MD  Calcium Carb-Cholecalciferol (CALCIUM 600+D) 600-800 MG-UNIT TABS Take 2 tablets by mouth daily.   Yes [provider]  cefUROXime (CEFTIN) 500 MG tablet Take 500 mg by mouth 2 (two) times daily with a meal.   Yes [provider]  cetirizine (ZYRTEC) 10 MG tablet Take 10 mg by mouth daily.   Yes [provider]  diphenhydrAMINE (BENADRYL) 25 mg capsule Take 1 capsule (25 mg total) by mouth at bedtime as needed for sleep. 09/17/17  Yes Gouru, Illene Silver, MD  docusate sodium (COLACE) 250 MG capsule Take 250 mg by mouth at bedtime.    Yes [provider]  ELIQUIS 5 MG TABS tablet TAKE ONE (1) TABLET BY MOUTH TWO (2) TIMES DAILY 04/11/17  Yes Chrismon, Vickki Muff, PA  gabapentin (NEURONTIN) 100 MG capsule TAKE 1 CAPSULE BY MOUTH THREE TIMES A DAY Patient taking differently: TAKE 1 CAPSULE (100MG ) BY MOUTH TWICE DAILY 05/18/17  Yes Chrismon, Vickki Muff, PA  guaiFENesin-dextromethorphan (ROBITUSSIN DM) 100-10 MG/5ML  syrup Take 5 mLs by mouth every 4 (four) hours as needed for cough. 09/17/17  Yes Gouru, Aruna, MD  ipratropium-albuterol (DUONEB) 0.5-2.5 (3) MG/3ML SOLN Take 3 mLs by nebulization every 4 (four) hours as needed. 09/19/17  Yes Chrismon, Vickki Muff, PA  PARoxetine (PAXIL) 10 MG tablet Take 1-2 tablets (10-20 mg total) by mouth daily. Take 1 tablet (10MG ) in the morning and 2 tablets (20MG ) at bedtime Patient taking differently: Take 10 mg by mouth daily. Take 1 tablet (10MG ) in the morning and 2 tablets (20MG ) at bedtime 09/15/17  Yes Wieting, Richard, MD  ranitidine (ZANTAC 75) 75 MG tablet Take 0.5 tablets (37.5 mg total) by mouth 2 (two) times daily. 09/15/17 12/14/17 Yes Loletha Grayer, MD  Spacer/Aero Chamber Mouthpiece MISC Use spacer on inhalers for better administration. 10/06/16  Yes [place], Vickki Muff, PA  SPIRIVA Michaell Cowing  18 MCG inhalation capsule INHALE CONTENTS OF ONE CAPSULE ONCE A DAY AS DIRECTED 02/18/17  Yes Birdie Sons, MD  SYMBICORT 160-4.5 MCG/ACT inhaler INHALE TWO PUFFS INTO THE LUNGS TWICE A DAY 02/20/17  Yes Chrismon, Vickki Muff, PA    Review of Systems    Chronic dyspnea on exertion.  She denies chest pain, palpitations, pnd, orthopnea, n, v, dizziness, syncope, edema, weight gain, or early satiety.  All other systems reviewed and are otherwise negative except as noted above.  Physical Exam    VS:  BP 110/60 (BP Location: Left Arm, Patient Position: Sitting, Cuff Size: Normal)   Pulse 87   Ht 5\' 2"  (1.575 m)   Wt 120 lb 8 oz (54.7 kg)   BMI 22.04 kg/m  , BMI Body mass index is 22.04 kg/m. GEN: Well nourished, well developed, in no acute distress.  HEENT: normal.  Neck: Supple, no JVD, carotid bruits, or masses. Cardiac: RRR, no murmurs, rubs, or gallops. No clubbing, cyanosis, edema.  Radials/DP/PT 2+ and equal bilaterally.  Respiratory:  Respirations regular and unlabored, clear to auscultation bilaterally. GI: Soft, nontender, nondistended, BS + x 4. MS: no deformity  or atrophy. Skin: warm and dry, no rash. Neuro:  Strength and sensation are intact. Psych: Normal affect.  Accessory Clinical Findings    ECG -regular sinus rhythm, 84, baseline artifact, nonspecific ST and T changes.  Assessment & Plan    1.  Coronary artery disease: Status post recent hospital station for respiratory failure in the setting of acute exacerbation of COPD with mild troponin elevation and nonspecific trend.  Echocardiogram showed normal LV function.  She has not been having chest pain at home but does have chronic dyspnea on exertion with inclines.  In light of prior history of CAD with remote stenting about 14 years ago and recent troponin elevation, I will arrange for a Lexiscan Myoview to assess for high risk features/ischemia.   2.  Essential hypertension: Stable on calcium channel blocker therapy.  3.  History of left MCA territory stroke: This was felt to be embolic in the setting of left lower extremity DVT and abnormal bubble study on transcranial Dopplers.  She is now on chronic Eliquis.  Implantable loop recorder has never shown atrial fibrillation.  4.  COPD: Stable since hospitalization.  No active wheezing today she is weaning off of prednisone.  Inhalers per primary care.  5.  Disposition: Follow-up stress testing as outlined above.  Follow-up with Dr. Rockey Situ in 3 months or sooner if necessary.   Murray Hodgkins, NP 09/27/2017, 12:29 PM

## 2017-09-27 NOTE — Patient Instructions (Addendum)
Medication Instructions:  Your physician recommends that you continue on your current medications as directed. Please refer to the Current Medication list given to you today.   Labwork: NONE  Testing/Procedures: Your physician has requested that you have a lexiscan myoview. For further information please visit [email or web address]. Please follow instruction sheet, as given.  [place]  Your caregiver has ordered a Stress Test with nuclear imaging. The purpose of this test is to evaluate the blood supply to your heart muscle. This procedure is referred to as a "Non-Invasive Stress Test." This is because other than having an IV started in your vein, nothing is inserted or "invades" your body. Cardiac stress tests are done to find areas of poor blood flow to the heart by determining the extent of coronary artery disease (CAD). Some patients exercise on a treadmill, which naturally increases the blood flow to your heart, while others who are  unable to walk on a treadmill due to physical limitations have a pharmacologic/chemical stress agent called Lexiscan . This medicine will mimic walking on a treadmill by temporarily increasing your coronary blood flow.   Please note: these test may take anywhere between 2-4 hours to complete  PLEASE REPORT TO [facility] AT THE FIRST DESK WILL DIRECT YOU WHERE TO GO  Date of Procedure:_____________________________________  Arrival Time for Procedure:______________________________    PLEASE NOTIFY THE OFFICE AT LEAST 24 HOURS IN ADVANCE IF YOU ARE UNABLE TO KEEP YOUR APPOINTMENT.  [phone] AND  PLEASE NOTIFY NUCLEAR MEDICINE AT [facility] AT LEAST 24 HOURS IN ADVANCE IF YOU ARE UNABLE TO KEEP YOUR APPOINTMENT. [phone]  How to prepare for your Myoview test:  1. Do not eat or drink after midnight 2. No caffeine for 24 hours prior to test 3. No smoking 24 hours prior to test. 4. Your medication may be taken  with water.  If your doctor stopped a medication because of this test, do not take that medication. 5. Ladies, please do not wear dresses.  Skirts or pants are appropriate. Please wear a short sleeve shirt. 6. No perfume, cologne or lotion. 7. Wear comfortable walking shoes. No heels!    Follow-Up: Your physician recommends that you schedule a follow-up appointment in: [place].  If you need a refill on your cardiac medications before your next appointment, please call your pharmacy.   Cardiac Nuclear Scan A cardiac nuclear scan is a test that measures blood flow to the heart when a person is resting and when he or she is exercising. The test looks for problems such as:  Not enough blood reaching a portion of the heart.  The heart muscle not working normally.  You may need this test if:  You have heart disease.  You have had abnormal lab results.  You have had heart surgery or angioplasty.  You have chest pain.  You have shortness of breath.  In this test, a radioactive dye (tracer) is injected into your bloodstream. After the tracer has traveled to your heart, an imaging device is used to measure how much of the tracer is absorbed by or distributed to various areas of your heart. This procedure is usually done at a hospital and takes 2-4 hours. Tell a health care provider about:  Any allergies you have.  All medicines you are taking, including vitamins, herbs, eye drops, creams, and over-the-counter medicines.  Any problems you or family members have had with the use of anesthetic medicines.  Any  blood disorders you have.  Any surgeries you have had.  Any medical conditions you have.  Whether you are pregnant or may be pregnant. What are the risks? Generally, this is a safe procedure. However, problems may occur, including:  Serious chest pain and heart attack. This is only a risk if the stress portion of the test is done.  Rapid  heartbeat.  Sensation of warmth in your chest. This usually passes quickly.  What happens before the procedure?  Ask your health care provider about changing or stopping your regular medicines. This is especially important if you are taking diabetes medicines or blood thinners.  Remove your jewelry on the day of the procedure. What happens during the procedure?  An IV tube will be inserted into one of your veins.  Your health care provider will inject a small amount of radioactive tracer through the tube.  You will wait for 20-40 minutes while the tracer travels through your bloodstream.  Your heart activity will be monitored with an electrocardiogram (ECG).  You will lie down on an exam table.  Images of your heart will be taken for about 15-20 minutes.  You may be asked to exercise on a treadmill or stationary bike. While you exercise, your heart's activity will be monitored with an ECG, and your blood pressure will be checked. If you are unable to exercise, you may be given a medicine to increase blood flow to parts of your heart.  When blood flow to your heart has peaked, a tracer will again be injected through the IV tube.  After 20-40 minutes, you will get back on the exam table and have more images taken of your heart.  When the procedure is over, your IV tube will be removed. The procedure may vary among health care providers and hospitals. Depending on the type of tracer used, scans may need to be repeated 3-4 hours later. What happens after the procedure?  Unless your health care provider tells you otherwise, you may return to your normal schedule, including diet, activities, and medicines.  Unless your health care provider tells you otherwise, you may increase your fluid intake. This will help flush the contrast dye from your body. Drink enough fluid to keep your urine clear or pale yellow.  It is up to you to get your test results. Ask your health care provider, or  the department that is doing the test, when your results will be ready. Summary  A cardiac nuclear scan measures the blood flow to the heart when a person is resting and when he or she is exercising.  You may need this test if you are at risk for heart disease.  Tell your health care provider if you are pregnant.  Unless your health care provider tells you otherwise, increase your fluid intake. This will help flush the contrast dye from your body. Drink enough fluid to keep your urine clear or pale yellow. This information is not intended to replace advice given to you by your health care provider. Make sure you discuss any questions you have with your health care provider. Document Released: 08/19/2004 Document Revised: 07/27/2016 Document Reviewed: 07/03/2013 Elsevier Interactive Patient Education  2017 [employer].

## 2017-09-28 NOTE — Telephone Encounter (Signed)
Do we have any of these forms?

## 2017-09-29 ENCOUNTER — Other Ambulatory Visit: Payer: Self-pay | Admitting: Family Medicine

## 2017-09-29 ENCOUNTER — Encounter
Admission: RE | Admit: 2017-09-29 | Discharge: 2017-09-29 | Disposition: A | Discharge status: Home / Self Care | Payer: PPO | Source: Ambulatory Visit | Attending: Nurse Practitioner | Admitting: Nurse Practitioner

## 2017-09-29 DIAGNOSIS — I2510 Atherosclerotic heart disease of native coronary artery without angina pectoris: Secondary | ICD-10-CM | POA: Diagnosis not present

## 2017-09-29 DIAGNOSIS — A403 Sepsis due to Streptococcus pneumoniae: Secondary | ICD-10-CM | POA: Diagnosis not present

## 2017-09-29 DIAGNOSIS — Z8673 Personal history of transient ischemic attack (TIA), and cerebral infarction without residual deficits: Secondary | ICD-10-CM | POA: Diagnosis not present

## 2017-09-29 DIAGNOSIS — J189 Pneumonia, unspecified organism: Secondary | ICD-10-CM | POA: Diagnosis not present

## 2017-09-29 LAB — NM MYOCAR MULTI W/SPECT W/WALL MOTION / EF
CHL CUP NUCLEAR SSS: 12
CHL CUP RESTING HR STRESS: 72 {beats}/min
CSEPED: 0 min
CSEPHR: 62 %
CSEPPHR: 88 {beats}/min
Estimated workload: 1 METS
Exercise duration (sec): 0 s
LV dias vol: 55 mL (ref 46–106)
LV sys vol: 25 mL
MPHR: 141 {beats}/min
SDS: 0
SRS: 5
TID: 0.85

## 2017-09-29 MED ORDER — REGADENOSON 0.4 MG/5ML IV SOLN
0.4000 mg | Freq: Once | INTRAVENOUS | Status: AC
  Administered 2017-09-29: 0.4 mg via INTRAVENOUS

## 2017-09-29 MED ORDER — TECHNETIUM TC 99M TETROFOSMIN IV KIT
30.0000 | PACK | Freq: Once | INTRAVENOUS | Status: AC | PRN
  Administered 2017-09-29: 32.02 via INTRAVENOUS

## 2017-09-29 MED ORDER — TECHNETIUM TC 99M TETROFOSMIN IV KIT
10.0000 | PACK | Freq: Once | INTRAVENOUS | Status: AC | PRN
  Administered 2017-09-29: 13.2 via INTRAVENOUS

## 2017-09-29 NOTE — Telephone Encounter (Signed)
Done.  Patient notified ready at front desk.

## 2017-09-30 LAB — COMPREHENSIVE METABOLIC PANEL
ALK PHOS: 69 IU/L (ref 39–117)
ALT: 16 IU/L (ref 0–32)
AST: 17 IU/L (ref 0–40)
Albumin/Globulin Ratio: 1.8 (ref 1.2–2.2)
Albumin: 3.8 g/dL (ref 3.5–4.8)
BILIRUBIN TOTAL: 0.2 mg/dL (ref 0.0–1.2)
BUN/Creatinine Ratio: 20 (ref 12–28)
BUN: 25 mg/dL (ref 8–27)
CHLORIDE: 103 mmol/L (ref 96–106)
CO2: 24 mmol/L (ref 20–29)
Calcium: 9.9 mg/dL (ref 8.7–10.3)
Creatinine, Ser: 1.25 mg/dL — ABNORMAL HIGH (ref 0.57–1.00)
GFR calc Af Amer: 47 mL/min/{1.73_m2} — ABNORMAL LOW (ref >59)
GFR calc non Af Amer: 41 mL/min/{1.73_m2} — ABNORMAL LOW (ref >59)
GLUCOSE: 96 mg/dL (ref 65–99)
Globulin, Total: 2.1 g/dL (ref 1.5–4.5)
Potassium: 4.9 mmol/L (ref 3.5–5.2)
Sodium: 141 mmol/L (ref 134–144)
Total Protein: 5.9 g/dL — ABNORMAL LOW (ref 6.0–8.5)

## 2017-10-04 ENCOUNTER — Encounter: Payer: Self-pay | Admitting: Physician Assistant

## 2017-10-04 ENCOUNTER — Ambulatory Visit (INDEPENDENT_AMBULATORY_CARE_PROVIDER_SITE_OTHER): Payer: PPO | Admitting: Physician Assistant

## 2017-10-04 VITALS — BP 122/64 | HR 88 | Temp 98.7°F | Resp 24 | Wt 121.0 lb

## 2017-10-04 DIAGNOSIS — J449 Chronic obstructive pulmonary disease, unspecified: Secondary | ICD-10-CM

## 2017-10-04 DIAGNOSIS — I63412 Cerebral infarction due to embolism of left middle cerebral artery: Secondary | ICD-10-CM

## 2017-10-04 DIAGNOSIS — I10 Essential (primary) hypertension: Secondary | ICD-10-CM

## 2017-10-04 NOTE — Patient Instructions (Signed)

## 2017-10-04 NOTE — Progress Notes (Signed)
Patient: Stephanie [name] Female    DOB: 1936-05-02   82 y.o.   MRN: [id number] Visit Date: 10/04/2017  Today's Provider: Trinna Post, PA-C   Chief Complaint  Patient presents with  . Hypertension   Subjective:    Stephanie [name] is an 82 y/o woman with history of CVA, HTN, and COPD presenting today for elevated BP. She presents with her daughter Beverlee Nims. She is here today because physical therapy came for a home session on Friday and this past Tuesday. When they took her BP, it was about 184/86 and 170's/unknown. Daughter reports blood pressure was taken over a thick sweater. The patient denies pertinent info below. She does endorse some blurred vision which is transient and seems to have been present for a while, ever since her stroke. The patient denies one sided vision loss, weakness. She is on 5 mg amlodipine nightly. She has recently been put on 2L oxygen via [place] after pneumonia and COPD exacerbation.  Hypertension  This is a chronic problem. The problem is uncontrolled (Pt reports her Physical therapist checked her blood pressure yesterday and it was 184/86). Associated symptoms include blurred vision and sweats. Pertinent negatives include no anxiety, chest pain, headaches, malaise/fatigue, neck pain, orthopnea, palpitations, peripheral edema, PND or shortness of breath.       Allergies  Allergen Reactions  . Duloxetine Hcl     unknown  . Influenza Vaccines     unknown  . Venlafaxine     GI distress     Current Outpatient Medications:  .  albuterol (VENTOLIN HFA) 108 (90 Base) MCG/ACT inhaler, Inhale 2 puffs into the lungs every 6 (six) hours as needed., Disp: 3 Inhaler, Rfl: 3 .  amLODipine (NORVASC) 5 MG tablet, Take 5 mg by mouth daily., Disp: , Rfl:  .  Calcium Carb-Cholecalciferol (CALCIUM 600+D) 600-800 MG-UNIT TABS, Take 2 tablets by mouth daily., Disp: , Rfl:  .  cetirizine (ZYRTEC) 10 MG tablet, Take 10 mg by mouth daily., Disp: , Rfl:  .   diphenhydrAMINE (BENADRYL) 25 mg capsule, Take 1 capsule (25 mg total) by mouth at bedtime as needed for sleep., Disp: 30 capsule, Rfl: 0 .  docusate sodium (COLACE) 250 MG capsule, Take 250 mg by mouth at bedtime. , Disp: , Rfl:  .  ELIQUIS 5 MG TABS tablet, TAKE ONE (1) TABLET BY MOUTH TWO (2) TIMES DAILY, Disp: 180 tablet, Rfl: 3 .  gabapentin (NEURONTIN) 100 MG capsule, TAKE 1 CAPSULE BY MOUTH THREE TIMES A DAY (Patient taking differently: TAKE 1 CAPSULE (100MG ) BY MOUTH TWICE DAILY), Disp: 90 capsule, Rfl: 3 .  ipratropium-albuterol (DUONEB) 0.5-2.5 (3) MG/3ML SOLN, Take 3 mLs by nebulization every 4 (four) hours as needed., Disp: 360 mL, Rfl: 0 .  PARoxetine (PAXIL) 10 MG tablet, Take 1-2 tablets (10-20 mg total) by mouth daily. Take 1 tablet (10MG ) in the morning and 2 tablets (20MG ) at bedtime (Patient taking differently: Take 10 mg by mouth daily. Take 1 tablet (10MG ) in the morning and 2 tablets (20MG ) at bedtime), Disp: , Rfl:  .  ranitidine (ZANTAC 75) 75 MG tablet, Take 0.5 tablets (37.5 mg total) by mouth 2 (two) times daily., Disp: 30 tablet, Rfl: 2 .  Spacer/Aero Chamber Mouthpiece MISC, Use spacer on inhalers for better administration., Disp: 1 each, Rfl: 1 .  SPIRIVA HANDIHALER 18 MCG inhalation capsule, INHALE CONTENTS OF ONE CAPSULE ONCE A DAY AS DIRECTED, Disp: 90 capsule, Rfl: 4 .  SYMBICORT  160-4.5 MCG/ACT inhaler, INHALE TWO PUFFS INTO THE LUNGS TWICE A DAY, Disp: 30.6 g, Rfl: 3 .  acetaminophen (TYLENOL) 500 MG tablet, Take 1,000 mg by mouth 2 (two) times daily as needed. , Disp: , Rfl:  .  benzonatate (TESSALON) 100 MG capsule, Take 1 capsule (100 mg total) by mouth 3 (three) times daily. (Patient taking differently: Take 100 mg by mouth 2 (two) times daily. ), Disp: 20 capsule, Rfl: 0 .  cefUROXime (CEFTIN) 500 MG tablet, Take 500 mg by mouth 2 (two) times daily with a meal., Disp: , Rfl:  .  guaiFENesin-dextromethorphan (ROBITUSSIN DM) 100-10 MG/5ML syrup, Take 5 mLs by mouth  every 4 (four) hours as needed for cough., Disp: 118 mL, Rfl: 0  Review of Systems  Constitutional: Positive for diaphoresis. Negative for activity change, appetite change, chills, fatigue, fever, malaise/fatigue and unexpected weight change.  Eyes: Positive for blurred vision.  Respiratory: Negative.  Negative for shortness of breath.   Cardiovascular: Negative for chest pain, palpitations, orthopnea, leg swelling and PND.  Gastrointestinal: Negative.   Musculoskeletal: Negative for neck pain.  Neurological: Positive for dizziness. Negative for light-headedness and headaches.    Social History   Tobacco Use  . Smoking status: Former Smoker    Last attempt to quit: 08/07/2001    Years since quitting: 16.1  . Smokeless tobacco: Never Used  . Tobacco comment: QUIT IN 2002  Substance Use Topics  . Alcohol use: No    Alcohol/week: 0.0 oz   Objective:   BP 122/64 (BP Location: Right Arm, Patient Position: Sitting, Cuff Size: Normal)   Pulse 88   Temp 98.7 F (37.1 C) (Oral)   Resp (!) 24   Wt 121 lb (54.9 kg)   BMI 22.13 kg/m  Vitals:   10/04/17 1059  BP: 122/64  Pulse: 88  Resp: (!) 24  Temp: 98.7 F (37.1 C)  TempSrc: Oral  Weight: 121 lb (54.9 kg)     Physical Exam  Constitutional: She appears well-developed and well-nourished.  Cardiovascular: Normal rate and regular rhythm.  Pulmonary/Chest: Effort normal and breath sounds normal. No respiratory distress.  Decreased air entry bilaterally.   Neurological: She is alert. She has normal reflexes. No cranial nerve deficit. Coordination normal.  Skin: Skin is warm and dry.  Psychiatric: She has a normal mood and affect. Her behavior is normal.        Assessment & Plan:     1. Hypertension, unspecified type  Her blood pressure is normotensive today. Her BP has been normotensive at our prior office visits x 6 mo as well. The patient does not endorse symptoms of end organ damage to indicate hypertensive urgency or  emergency. She does have blurred vision, which seems to have been present after her stroke. This blurred vision is intermittent. She is denying stroke like symptoms. Suspect her sweater may have artificially increased BP reading. Do not advise any changes to her BP regimen, as increasing medication may cause hypotension, dizziness, and put her at increased risk of falls.   2. COPD, moderate ([place])  She is currently on 2L oxygen via nasal cannula.   3. Cerebrovascular accident (CVA) due to embolism of left middle cerebral artery ([place])  Residual effects include expressive aphasia.  Return if symptoms worsen or fail to improve.  The entirety of the information documented in the History of Present Illness, Review of Systems and Physical Exam were personally obtained by me. Portions of this information were initially documented by Ashley Royalty,  CMA and reviewed by me for thoroughness and accuracy.          Trinna Post, PA-C  [facility]

## 2017-10-05 DIAGNOSIS — J440 Chronic obstructive pulmonary disease with acute lower respiratory infection: Secondary | ICD-10-CM | POA: Diagnosis not present

## 2017-10-05 DIAGNOSIS — J441 Chronic obstructive pulmonary disease with (acute) exacerbation: Secondary | ICD-10-CM | POA: Diagnosis not present

## 2017-10-05 DIAGNOSIS — I2510 Atherosclerotic heart disease of native coronary artery without angina pectoris: Secondary | ICD-10-CM | POA: Diagnosis not present

## 2017-10-05 DIAGNOSIS — J189 Pneumonia, unspecified organism: Secondary | ICD-10-CM | POA: Diagnosis not present

## 2017-10-06 ENCOUNTER — Telehealth: Payer: Self-pay | Admitting: Family Medicine

## 2017-10-06 LAB — CUP PACEART REMOTE DEVICE CHECK
Date Time Interrogation Session: 20190207034426
Implantable Pulse Generator Implant Date: 20180112

## 2017-10-06 NOTE — Telephone Encounter (Signed)
1. Stephanie [name] Physical Therapist with [facility] is requesting verbal orders to continue physical therapy with pt as follows:  Twice a week for 3 weeks   2. Stephanie [name] is also requesting orders for speech therapy eval.  Please advise. Thanks TNP

## 2017-10-06 NOTE — Telephone Encounter (Signed)
Left Stephanie [name] with [facility] a voicemail advising her per Simona Huh okay for verbal orders and call back if needed.

## 2017-10-06 NOTE — Telephone Encounter (Signed)
Give order to proceed as above.

## 2017-10-13 ENCOUNTER — Other Ambulatory Visit: Payer: Self-pay | Admitting: Family Medicine

## 2017-10-13 ENCOUNTER — Telehealth: Payer: Self-pay | Admitting: Cardiology

## 2017-10-13 DIAGNOSIS — J441 Chronic obstructive pulmonary disease with (acute) exacerbation: Secondary | ICD-10-CM

## 2017-10-13 DIAGNOSIS — J45901 Unspecified asthma with (acute) exacerbation: Principal | ICD-10-CM

## 2017-10-13 MED ORDER — IPRATROPIUM-ALBUTEROL 0.5-2.5 (3) MG/3ML IN SOLN: 3.0000 mL | RESPIRATORY_TRACT | 3 refills | Status: DC | PRN

## 2017-10-13 NOTE — Telephone Encounter (Signed)
Spoke with pt's daughter ok per DPR informed her that the transmission came through automatically, she voiced understanding.

## 2017-10-13 NOTE — Telephone Encounter (Signed)
New Message:    Please call,she needs to know how to send transmission,pt do not know what to do.

## 2017-10-13 NOTE — Telephone Encounter (Signed)
[facility] pharmacy faxed a refill request for a 90-days supply for the following medication. Thanks CC  ipratropium-albuterol (DUONEB) 0.5-2.5 (3) MG/3ML SOLN

## 2017-10-16 ENCOUNTER — Ambulatory Visit (INDEPENDENT_AMBULATORY_CARE_PROVIDER_SITE_OTHER): Payer: PPO | Admitting: *Deleted

## 2017-10-16 DIAGNOSIS — I639 Cerebral infarction, unspecified: Secondary | ICD-10-CM | POA: Diagnosis not present

## 2017-10-17 ENCOUNTER — Telehealth: Payer: Self-pay | Admitting: Family Medicine

## 2017-10-17 DIAGNOSIS — J449 Chronic obstructive pulmonary disease, unspecified: Secondary | ICD-10-CM | POA: Diagnosis not present

## 2017-10-17 NOTE — Telephone Encounter (Signed)
Please review. Thanks!  

## 2017-10-17 NOTE — Progress Notes (Signed)
Carelink Summary Report / Loop Recorder 

## 2017-10-17 NOTE — Telephone Encounter (Signed)
Agree with this plan. Send verbal order to proceed.

## 2017-10-17 NOTE — Telephone Encounter (Signed)
Advised Amy as below.

## 2017-10-17 NOTE — Telephone Encounter (Signed)
Amy with [facility] is requesting verbal orders for in home speech therapy as follows:  Twice a week for 4 weeks (2w4)  Please advise. Thanks TNP

## 2017-10-18 ENCOUNTER — Encounter: Payer: Self-pay | Admitting: Family Medicine

## 2017-10-18 ENCOUNTER — Ambulatory Visit (INDEPENDENT_AMBULATORY_CARE_PROVIDER_SITE_OTHER): Payer: PPO | Admitting: Family Medicine

## 2017-10-18 VITALS — BP 102/60 | HR 76 | Temp 97.4°F | Resp 16

## 2017-10-18 DIAGNOSIS — L282 Other prurigo: Secondary | ICD-10-CM | POA: Diagnosis not present

## 2017-10-18 MED ORDER — HYDROXYZINE HCL 10 MG PO TABS: 10.0000 mg | ORAL_TABLET | Freq: Three times a day (TID) | ORAL | 0 refills | Status: DC | PRN

## 2017-10-18 NOTE — Patient Instructions (Signed)
Let us know if hydroxyzine helped. If not we will refer to dermatology.l

## 2017-10-18 NOTE — Progress Notes (Signed)
Subjective:     Patient ID: Stephanie [name], female   DOB: 12/21/1935, 82 y.o.   MRN: [id number] Chief Complaint  Patient presents with  . Rash    Comes and goes.  Has been going on for months   HPI Here for further treatment of chronic urticaria. 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 Currently on Zyrtec and Zantac. Has also been on prednisone and Benadryl.  Review of Systems     Objective:   Physical Exam  Constitutional: She appears well-developed and well-nourished. No distress.  Pulmonary/Chest: She has no wheezes.  Skin:  Numerous papules on her arms and back of her neck with excoriation and bruising in one area.       Assessment:    1. Papular urticaria - hydrOXYzine (ATARAX/VISTARIL) 10 MG tablet; Take 1 tablet (10 mg total) by mouth 3 (three) times daily as needed.  Dispense: 30 tablet; Refill: 0    Plan:    Call if not improving for dermatology referral.

## 2017-10-23 DIAGNOSIS — J189 Pneumonia, unspecified organism: Secondary | ICD-10-CM | POA: Diagnosis not present

## 2017-10-23 DIAGNOSIS — R0902 Hypoxemia: Secondary | ICD-10-CM | POA: Diagnosis not present

## 2017-10-23 DIAGNOSIS — J449 Chronic obstructive pulmonary disease, unspecified: Secondary | ICD-10-CM | POA: Diagnosis not present

## 2017-10-23 DIAGNOSIS — R9389 Abnormal findings on diagnostic imaging of other specified body structures: Secondary | ICD-10-CM | POA: Diagnosis not present

## 2017-10-23 DIAGNOSIS — J13 Pneumonia due to Streptococcus pneumoniae: Secondary | ICD-10-CM | POA: Diagnosis not present

## 2017-10-23 NOTE — Telephone Encounter (Signed)
Appt sched.

## 2017-10-24 ENCOUNTER — Telehealth: Payer: Self-pay | Admitting: Family Medicine

## 2017-10-24 NOTE — Telephone Encounter (Signed)
[facility]my with [facility] advised. She states she discussed with patient regarding the incident. Amy states patient didn't fall as soon as she stood up. She states patient reported the neighbor tried to come in the front door and she feels that patient just loss balance as she was stepping back.

## 2017-10-24 NOTE — Telephone Encounter (Signed)
Stephanie [name] with [facility] called to let you know that while at the patient's home patient fell but doesn't seem to be injured.  She states that the patient tried to answer the door when a neighbor stopped by and when she opened the door when she stepped back she lost her balance and fell.  She did not fall hard and seems to be okay.  She states that it is their policy to let you know when something like this happens.

## 2017-10-24 NOTE — Telephone Encounter (Signed)
Advise [facility] (Amy) to monitor her for dizziness, blood pressure drops and significant changes in balance. If any further events or concerns, should schedule follow up appointment here.

## 2017-11-06 DIAGNOSIS — L508 Other urticaria: Secondary | ICD-10-CM | POA: Diagnosis not present

## 2017-11-08 DIAGNOSIS — Z7901 Long term (current) use of anticoagulants: Secondary | ICD-10-CM | POA: Diagnosis not present

## 2017-11-08 DIAGNOSIS — J189 Pneumonia, unspecified organism: Secondary | ICD-10-CM | POA: Diagnosis not present

## 2017-11-08 DIAGNOSIS — J440 Chronic obstructive pulmonary disease with acute lower respiratory infection: Secondary | ICD-10-CM | POA: Diagnosis not present

## 2017-11-08 DIAGNOSIS — J441 Chronic obstructive pulmonary disease with (acute) exacerbation: Secondary | ICD-10-CM | POA: Diagnosis not present

## 2017-11-08 DIAGNOSIS — Z9981 Dependence on supplemental oxygen: Secondary | ICD-10-CM | POA: Diagnosis not present

## 2017-11-08 DIAGNOSIS — Z95818 Presence of other cardiac implants and grafts: Secondary | ICD-10-CM | POA: Diagnosis not present

## 2017-11-08 DIAGNOSIS — Z87891 Personal history of nicotine dependence: Secondary | ICD-10-CM | POA: Diagnosis not present

## 2017-11-09 ENCOUNTER — Telehealth: Payer: Self-pay | Admitting: Family Medicine

## 2017-11-09 NOTE — Telephone Encounter (Signed)
Stephanie [name] with [facility] is calling to get verbal orders to continue home health speech therapy two times a week for two more weeks.

## 2017-11-10 NOTE — Telephone Encounter (Signed)
Advised  ED 

## 2017-11-10 NOTE — Telephone Encounter (Signed)
That's fine

## 2017-11-10 NOTE — Telephone Encounter (Signed)
Dr Caryn Section, Would you OK this for Simona Huh' patient Thanks ED

## 2017-11-13 ENCOUNTER — Other Ambulatory Visit: Payer: Self-pay | Admitting: Family Medicine

## 2017-11-13 DIAGNOSIS — R4701 Aphasia: Secondary | ICD-10-CM

## 2017-11-13 DIAGNOSIS — I63412 Cerebral infarction due to embolism of left middle cerebral artery: Secondary | ICD-10-CM

## 2017-11-16 ENCOUNTER — Telehealth: Payer: Self-pay | Admitting: Family Medicine

## 2017-11-16 NOTE — Telephone Encounter (Signed)
Should schedule recheck her or with ophthalmologist if no better in 3 days. If no discomfort, vision changes or drainage, this usually clears easily without significant concern.

## 2017-11-16 NOTE — Telephone Encounter (Signed)
Amy from [facility] is calling to let you know that the patient's eyes have bright red streaks in outside of the whites of both eyes that looks like blood.  It is not sore.  She first noticed it when Amy pointed it out to her today.  She just wants to report this.

## 2017-11-17 DIAGNOSIS — J449 Chronic obstructive pulmonary disease, unspecified: Secondary | ICD-10-CM | POA: Diagnosis not present

## 2017-11-17 NOTE — Telephone Encounter (Signed)
Amy advised as directed below.   Amy with [facility] is also requesting verbal orders to recertify patient's home health speech therapy.

## 2017-11-17 NOTE — Telephone Encounter (Signed)
Verbal order given to Amy with [facility].

## 2017-11-17 NOTE — Telephone Encounter (Signed)
May recertify verbal order for home speech therapy with [facility].

## 2017-11-20 ENCOUNTER — Ambulatory Visit (INDEPENDENT_AMBULATORY_CARE_PROVIDER_SITE_OTHER): Payer: PPO | Admitting: *Deleted

## 2017-11-20 DIAGNOSIS — I639 Cerebral infarction, unspecified: Secondary | ICD-10-CM | POA: Diagnosis not present

## 2017-11-20 NOTE — Progress Notes (Signed)
Carelink Summary Report / Loop Recorder 

## 2017-11-23 ENCOUNTER — Telehealth: Payer: Self-pay | Admitting: Family Medicine

## 2017-11-23 LAB — CUP PACEART REMOTE DEVICE CHECK
Date Time Interrogation Session: 20190312081142
Implantable Pulse Generator Implant Date: 20180112

## 2017-11-23 NOTE — Telephone Encounter (Signed)
Stephanie [name] speech Therapist with [name] stated she is going to be on vacation next week and pt requested no speech therapy next week. Stephanie is requesting verbal orders so that the 2 visits pt was supposed to have next week (4/22-4/26/19) be added to the end of the plan of care for this pt. Please advise. Thanks TNP

## 2017-11-23 NOTE — Telephone Encounter (Signed)
OK to adjust visit schedule to allow vacation time.

## 2017-11-27 ENCOUNTER — Other Ambulatory Visit: Payer: Self-pay | Admitting: Family Medicine

## 2017-11-28 NOTE — Telephone Encounter (Signed)
Verbal given for therapy be delayed for vacation and make up at the end.

## 2017-12-06 DIAGNOSIS — L508 Other urticaria: Secondary | ICD-10-CM | POA: Diagnosis not present

## 2017-12-14 DIAGNOSIS — R0609 Other forms of dyspnea: Secondary | ICD-10-CM | POA: Diagnosis not present

## 2017-12-14 DIAGNOSIS — Z9981 Dependence on supplemental oxygen: Secondary | ICD-10-CM | POA: Diagnosis not present

## 2017-12-14 DIAGNOSIS — J439 Emphysema, unspecified: Secondary | ICD-10-CM | POA: Diagnosis not present

## 2017-12-14 DIAGNOSIS — R0602 Shortness of breath: Secondary | ICD-10-CM | POA: Diagnosis not present

## 2017-12-15 DIAGNOSIS — J440 Chronic obstructive pulmonary disease with acute lower respiratory infection: Secondary | ICD-10-CM | POA: Diagnosis not present

## 2017-12-15 DIAGNOSIS — Z955 Presence of coronary angioplasty implant and graft: Secondary | ICD-10-CM | POA: Diagnosis not present

## 2017-12-15 DIAGNOSIS — J189 Pneumonia, unspecified organism: Secondary | ICD-10-CM | POA: Diagnosis not present

## 2017-12-15 DIAGNOSIS — Z9981 Dependence on supplemental oxygen: Secondary | ICD-10-CM | POA: Diagnosis not present

## 2017-12-15 DIAGNOSIS — Z7901 Long term (current) use of anticoagulants: Secondary | ICD-10-CM | POA: Diagnosis not present

## 2017-12-15 DIAGNOSIS — F329 Major depressive disorder, single episode, unspecified: Secondary | ICD-10-CM | POA: Diagnosis not present

## 2017-12-15 DIAGNOSIS — I2510 Atherosclerotic heart disease of native coronary artery without angina pectoris: Secondary | ICD-10-CM | POA: Diagnosis not present

## 2017-12-15 DIAGNOSIS — I10 Essential (primary) hypertension: Secondary | ICD-10-CM | POA: Diagnosis not present

## 2017-12-15 DIAGNOSIS — Z7951 Long term (current) use of inhaled steroids: Secondary | ICD-10-CM | POA: Diagnosis not present

## 2017-12-15 DIAGNOSIS — Z87891 Personal history of nicotine dependence: Secondary | ICD-10-CM | POA: Diagnosis not present

## 2017-12-15 DIAGNOSIS — J441 Chronic obstructive pulmonary disease with (acute) exacerbation: Secondary | ICD-10-CM | POA: Diagnosis not present

## 2017-12-15 DIAGNOSIS — Z95818 Presence of other cardiac implants and grafts: Secondary | ICD-10-CM | POA: Diagnosis not present

## 2017-12-15 DIAGNOSIS — I69322 Dysarthria following cerebral infarction: Secondary | ICD-10-CM | POA: Diagnosis not present

## 2017-12-15 DIAGNOSIS — F419 Anxiety disorder, unspecified: Secondary | ICD-10-CM | POA: Diagnosis not present

## 2017-12-17 DIAGNOSIS — J449 Chronic obstructive pulmonary disease, unspecified: Secondary | ICD-10-CM | POA: Diagnosis not present

## 2017-12-18 ENCOUNTER — Ambulatory Visit: Payer: PPO | Admitting: Cardiovascular Disease

## 2017-12-22 ENCOUNTER — Ambulatory Visit (INDEPENDENT_AMBULATORY_CARE_PROVIDER_SITE_OTHER): Payer: PPO | Admitting: *Deleted

## 2017-12-22 DIAGNOSIS — I639 Cerebral infarction, unspecified: Secondary | ICD-10-CM

## 2017-12-22 NOTE — Progress Notes (Signed)
Carelink Summary Report / Loop Recorder 

## 2017-12-25 LAB — CUP PACEART REMOTE DEVICE CHECK
Date Time Interrogation Session: 20190414083521
MDC IDC PG IMPLANT DT: 20180112

## 2017-12-29 ENCOUNTER — Ambulatory Visit: Payer: PPO | Admitting: Family Medicine

## 2018-01-05 ENCOUNTER — Encounter: Payer: Self-pay | Admitting: Family Medicine

## 2018-01-05 ENCOUNTER — Ambulatory Visit (INDEPENDENT_AMBULATORY_CARE_PROVIDER_SITE_OTHER): Payer: PPO | Admitting: Family Medicine

## 2018-01-05 ENCOUNTER — Ambulatory Visit (INDEPENDENT_AMBULATORY_CARE_PROVIDER_SITE_OTHER): Payer: PPO

## 2018-01-05 VITALS — BP 134/60 | HR 88 | Temp 97.7°F | Ht 62.0 in | Wt 121.2 lb

## 2018-01-05 DIAGNOSIS — J449 Chronic obstructive pulmonary disease, unspecified: Secondary | ICD-10-CM

## 2018-01-05 DIAGNOSIS — R4701 Aphasia: Secondary | ICD-10-CM

## 2018-01-05 DIAGNOSIS — K5900 Constipation, unspecified: Secondary | ICD-10-CM

## 2018-01-05 DIAGNOSIS — E782 Mixed hyperlipidemia: Secondary | ICD-10-CM

## 2018-01-05 DIAGNOSIS — I10 Essential (primary) hypertension: Secondary | ICD-10-CM | POA: Diagnosis not present

## 2018-01-05 DIAGNOSIS — Z0000 Encounter for general adult medical examination without abnormal findings: Secondary | ICD-10-CM | POA: Diagnosis not present

## 2018-01-05 NOTE — Patient Instructions (Addendum)
Stephanie [name] , Thank you for taking time to come for your Medicare Wellness Visit. I appreciate your ongoing commitment to your health goals. Please review the following plan we discussed and let me know if I can assist you in the future.   Screening recommendations/referrals: Colonoscopy: Up to date Mammogram: Up to date Bone Density: Up to date Recommended yearly ophthalmology/optometry visit for glaucoma screening and checkup Recommended yearly dental visit for hygiene and checkup  Vaccinations: Influenza vaccine: N/A Pneumococcal vaccine: Up to date Tdap vaccine: Pt declines today.  Shingles vaccine: Pt declines today.     Advanced directives: Already on file.  Conditions/risks identified: Fall risk prevention.   Next appointment: 9:00 AM today with Stephanie [name].   Preventive Care 27 Years and Older, Female Preventive care refers to lifestyle choices and visits with your health care provider that can promote health and wellness. What does preventive care include?  A yearly physical exam. This is also called an annual well check.  Dental exams once or twice a year.  Routine eye exams. Ask your health care provider how often you should have your eyes checked.  Personal lifestyle choices, including:  Daily care of your teeth and gums.  Regular physical activity.  Eating a healthy diet.  Avoiding tobacco and drug use.  Limiting alcohol use.  Practicing safe sex.  Taking low-dose aspirin every day.  Taking vitamin and mineral supplements as recommended by your health care provider. What happens during an annual well check? The services and screenings done by your health care provider during your annual well check will depend on your age, overall health, lifestyle risk factors, and family history of disease. Counseling  Your health care provider may ask you questions about your:  Alcohol use.  Tobacco use.  Drug use.  Emotional well-being.  Home and  relationship well-being.  Sexual activity.  Eating habits.  History of falls.  Memory and ability to understand (cognition).  Work and work Statistician.  Reproductive health. Screening  You may have the following tests or measurements:  Height, weight, and BMI.  Blood pressure.  Lipid and cholesterol levels. These may be checked every 5 years, or more frequently if you are over 76 years old.  Skin check.  Lung cancer screening. You may have this screening every year starting at age 58 if you have a 30-pack-year history of smoking and currently smoke or have quit within the past 15 years.  Fecal occult blood test (FOBT) of the stool. You may have this test every year starting at age 31.  Flexible sigmoidoscopy or colonoscopy. You may have a sigmoidoscopy every 5 years or a colonoscopy every 10 years starting at age 76.  Hepatitis C blood test.  Hepatitis B blood test.  Sexually transmitted disease (STD) testing.  Diabetes screening. This is done by checking your blood sugar (glucose) after you have not eaten for a while (fasting). You may have this done every 1-3 years.  Bone density scan. This is done to screen for osteoporosis. You may have this done starting at age 83.  Mammogram. This may be done every 1-2 years. Talk to your health care provider about how often you should have regular mammograms. Talk with your health care provider about your test results, treatment options, and if necessary, the need for more tests. Vaccines  Your health care provider may recommend certain vaccines, such as:  Influenza vaccine. This is recommended every year.  Tetanus, diphtheria, and acellular pertussis (Tdap, Td) vaccine. You may  need a Td booster every 10 years.  Zoster vaccine. You may need this after age 93.  Pneumococcal 13-valent conjugate (PCV13) vaccine. One dose is recommended after age 18.  Pneumococcal polysaccharide (PPSV23) vaccine. One dose is recommended after  age 59. Talk to your health care provider about which screenings and vaccines you need and how often you need them. This information is not intended to replace advice given to you by your health care provider. Make sure you discuss any questions you have with your health care provider. Document Released: 08/21/2015 Document Revised: 04/13/2016 Document Reviewed: 05/26/2015 Elsevier Interactive Patient Education  2017 [place] Prevention in the Home Falls can cause injuries. They can happen to people of all ages. There are many things you can do to make your home safe and to help prevent falls. What can I do on the outside of my home?  Regularly fix the edges of walkways and driveways and fix any cracks.  Remove anything that might make you trip as you walk through a door, such as a raised step or threshold.  Trim any bushes or trees on the path to your home.  Use bright outdoor lighting.  Clear any walking paths of anything that might make someone trip, such as rocks or tools.  Regularly check to see if handrails are loose or broken. Make sure that both sides of any steps have handrails.  Any raised decks and porches should have guardrails on the edges.  Have any leaves, snow, or ice cleared regularly.  Use sand or salt on walking paths during winter.  Clean up any spills in your garage right away. This includes oil or grease spills. What can I do in the bathroom?  Use night lights.  Install grab bars by the toilet and in the tub and shower. Do not use towel bars as grab bars.  Use non-skid mats or decals in the tub or shower.  If you need to sit down in the shower, use a plastic, non-slip stool.  Keep the floor dry. Clean up any water that spills on the floor as soon as it happens.  Remove soap buildup in the tub or shower regularly.  Attach bath mats securely with double-sided non-slip rug tape.  Do not have throw rugs and other things on the floor that can  make you trip. What can I do in the bedroom?  Use night lights.  Make sure that you have a light by your bed that is easy to reach.  Do not use any sheets or blankets that are too big for your bed. They should not hang down onto the floor.  Have a firm chair that has side arms. You can use this for support while you get dressed.  Do not have throw rugs and other things on the floor that can make you trip. What can I do in the kitchen?  Clean up any spills right away.  Avoid walking on wet floors.  Keep items that you use a lot in easy-to-reach places.  If you need to reach something above you, use a strong step stool that has a grab bar.  Keep electrical cords out of the way.  Do not use floor polish or wax that makes floors slippery. If you must use wax, use non-skid floor wax.  Do not have throw rugs and other things on the floor that can make you trip. What can I do with my stairs?  Do not leave any items on  the stairs.  Make sure that there are handrails on both sides of the stairs and use them. Fix handrails that are broken or loose. Make sure that handrails are as long as the stairways.  Check any carpeting to make sure that it is firmly attached to the stairs. Fix any carpet that is loose or worn.  Avoid having throw rugs at the top or bottom of the stairs. If you do have throw rugs, attach them to the floor with carpet tape.  Make sure that you have a light switch at the top of the stairs and the bottom of the stairs. If you do not have them, ask someone to add them for you. What else can I do to help prevent falls?  Wear shoes that:  Do not have high heels.  Have rubber bottoms.  Are comfortable and fit you well.  Are closed at the toe. Do not wear sandals.  If you use a stepladder:  Make sure that it is fully opened. Do not climb a closed stepladder.  Make sure that both sides of the stepladder are locked into place.  Ask someone to hold it for you,  if possible.  Clearly mark and make sure that you can see:  Any grab bars or handrails.  First and last steps.  Where the edge of each step is.  Use tools that help you move around (mobility aids) if they are needed. These include:  Canes.  Walkers.  Scooters.  Crutches.  Turn on the lights when you go into a dark area. Replace any light bulbs as soon as they burn out.  Set up your furniture so you have a clear path. Avoid moving your furniture around.  If any of your floors are uneven, fix them.  If there are any pets around you, be aware of where they are.  Review your medicines with your doctor. Some medicines can make you feel dizzy. This can increase your chance of falling. Ask your doctor what other things that you can do to help prevent falls. This information is not intended to replace advice given to you by your health care provider. Make sure you discuss any questions you have with your health care provider. Document Released: 05/21/2009 Document Revised: 12/31/2015 Document Reviewed: 08/29/2014 Elsevier Interactive Patient Education  2017 [employer].

## 2018-01-05 NOTE — Progress Notes (Addendum)
Subjective:   Stephanie [name] is a 82 y.o. female who presents for Medicare Annual (Subsequent) preventive examination.  Review of Systems:  N/A  Cardiac Risk Factors include: advanced age (>75men, >9 women);dyslipidemia;hypertension     Objective:     Vitals: BP 134/60 (BP Location: Right Arm)   Pulse 88   Temp 97.7 F (36.5 C) (Oral)   Ht 5\' 2"  (1.575 m)   Wt 121 lb 3.2 oz (55 kg)   BMI 22.17 kg/m   Body mass index is 22.17 kg/m.  Advanced Directives 01/05/2018 09/15/2017 09/15/2017 09/12/2017 09/12/2017 10/05/2016 10/03/2016  Does Patient Have a Medical Advance Directive? Yes Yes Yes Yes Yes Yes Yes  Type of Paramedic of [place];Living will [place] of facility DNR (pink MOST or yellow form) Out of facility DNR (pink MOST or yellow form);[place];Living will [place];Living will Living will;Healthcare Power of [place];Living will  Does patient want to make changes to medical advance directive? - No - Patient declined No - Patient declined No - Patient declined - No - Patient declined -  Copy of [place] in Chart? Yes Yes - - No - copy requested Yes Yes  Would patient like information on creating a medical advance directive? - - - - - - -  Pre-existing out of facility DNR order (yellow form or pink MOST form) - Yellow form placed in chart (order not valid for inpatient use) Yellow form placed in chart (order not valid for inpatient use) - - - -    Tobacco Social History   Tobacco Use  Smoking Status Former Smoker  . Last attempt to quit: 08/07/2001  . Years since quitting: 16.4  Smokeless Tobacco Never Used  Tobacco Comment   QUIT IN 2002     Counseling given: Not Answered Comment: QUIT IN 2002   Clinical Intake:  Pre-visit preparation completed: Yes  Pain : No/denies pain Pain Score: 0-No pain     Nutritional Status: BMI  of 19-24  Normal Nutritional Risks: None Diabetes: No  How often do you need to have someone help you when you read instructions, pamphlets, or other written materials from your doctor or pharmacy?: 3 - Sometimes  Interpreter Needed?: No  Information entered by :: [facility], LPN  Past Medical History:  Diagnosis Date  . Arthritis   . Baker's cyst of knee, right    a. 08/2016 U/S: Baker's cyst noted in R popliteal fossa.  . Complication of anesthesia   . COPD (chronic obstructive pulmonary disease) ([place])    a. Home O2 started 08/2017.  Marland Kitchen Coronary artery disease    a. prior h/o stenting ~ 2005.  Marland Kitchen Depression   . Edema   . History of cardiac monitoring    a. 08/2016 s/p LINQ placement following CVA. No AFib to date (09/2017).  Marland Kitchen History of pneumonia   . Hx of ischemic left MCA stroke    a. 08/2016 large posterior L MCA territory infarct in the setting of LLE DVT and suggestion of PFO on transcranial dopplers w/ bubble study (no PFO reported on echocardiogram)-->Eliquis.  . Left leg DVT ([place])    a. 08/2016 U/S: subacute DVT in prox L popliteal vein w/ chronic superficial thrombosis in the L greater saphenous vein from the mid to prox L calf-->Eliquis.  Marland Kitchen PFO (patent foramen ovale)    a. 08/2016 Abnl TCD w/ bubble study suggesting large PFO.  Not reported on echocardiogram.  . PONV (postoperative nausea and vomiting)    Past Surgical History:  Procedure Laterality Date  . BALLOON ANGIOPLASTY, ARTERY  05/17/2009  . CATARACT EXTRACTION W/PHACO Left 05/24/2016   Procedure: CATARACT EXTRACTION PHACO AND INTRAOCULAR LENS PLACEMENT (IOC);  Surgeon: Birder Robson, MD;  Location: [facility] ORS;  Service: Ophthalmology;  Laterality: Left;  Lot# [id number] H [place]:   00:52.3 AP%:   27.3 CDE:  14.26   . CATARACT EXTRACTION W/PHACO Right 06/14/2016   Procedure: CATARACT EXTRACTION PHACO AND INTRAOCULAR LENS PLACEMENT (IOC);  Surgeon: Birder Robson, MD;  Location: [facility] ORS;  Service: Ophthalmology;   Laterality: Right;  Lot# [id number] H [place]: 01:13.4 AP%: 24.4 CDE: 17.85  . CORONARY ANGIOPLASTY     STENT  . EP IMPLANTABLE DEVICE N/A 08/19/2016   Procedure: Loop Recorder Insertion;  Surgeon: Will Meredith Leeds, MD;  Location: [place] [facility];  Service: Cardiovascular;  Laterality: N/A;   Family History  Problem Relation Age of Onset  . Hypertension Mother   . Diabetes Mother   . Pancreatic cancer Mother 64  . Cancer Mother   . Suicidality Father 35  . Hypertension Father   . Alcohol abuse Father   . Hypertension Sister   . Heart disease Sister   . Kidney disease Sister   . Varicose Veins Sister   . Parkinson's disease Sister    Social History   Socioeconomic History  . Marital status: Widowed    Spouse name: Not on file  . Number of children: 4  . Years of education: Not on file  . Highest education level: 12th grade  Occupational History  . Occupation: retired  Scientific laboratory technician  . Financial resource strain: Not hard at all  . Food insecurity:    Worry: Never true    Inability: Never true  . Transportation needs:    Medical: No    Non-medical: No  Tobacco Use  . Smoking status: Former Smoker    Last attempt to quit: 08/07/2001    Years since quitting: 16.4  . Smokeless tobacco: Never Used  . Tobacco comment: QUIT IN 2002  Substance and Sexual Activity  . Alcohol use: No    Alcohol/week: 0.0 oz  . Drug use: No  . Sexual activity: Not on file  Lifestyle  . Physical activity:    Days per week: Not on file    Minutes per session: Not on file  . Stress: Only a little  Relationships  . Social connections:    Talks on phone: Not on file    Gets together: Not on file    Attends religious service: Not on file    Active member of club or organization: Not on file    Attends meetings of clubs or organizations: Not on file    Relationship status: Not on file  Other Topics Concern  . Not on file  Social History Narrative   Lives alone    Outpatient Encounter  Medications as of 01/05/2018  Medication Sig  . acetaminophen (TYLENOL) 500 MG tablet Take 1,000 mg by mouth 2 (two) times daily as needed.   Marland Kitchen albuterol (VENTOLIN HFA) 108 (90 Base) MCG/ACT inhaler Inhale 2 puffs into the lungs every 6 (six) hours as needed.  Marland Kitchen amLODipine (NORVASC) 5 MG tablet TAKE ONE TABLET BY MOUTH EVERY DAY  . Calcium Carb-Cholecalciferol (CALCIUM 600+D) 600-800 MG-UNIT TABS Take 1 tablet by mouth daily.   . cetirizine (ZYRTEC) 10 MG tablet Take 10 mg by mouth daily.  Marland Kitchen  docusate sodium (COLACE) 250 MG capsule Take 100 mg by mouth at bedtime.   Marland Kitchen ELIQUIS 5 MG TABS tablet TAKE ONE (1) TABLET BY MOUTH TWO (2) TIMES DAILY  . gabapentin (NEURONTIN) 100 MG capsule TAKE 1 CAPSULE (100MG ) BY MOUTH TWICE DAILY  . PARoxetine (PAXIL) 10 MG tablet Take 1-2 tablets (10-20 mg total) by mouth daily. Take 1 tablet (10MG ) in the morning and 2 tablets (20MG ) at bedtime (Patient taking differently: Take 10 mg by mouth daily. Take 1 tablet (10MG ) in the morning and 2 tablets (20MG ) at bedtime)  . Spacer/Aero Chamber Mouthpiece MISC Use spacer on inhalers for better administration.  Marland Kitchen SPIRIVA HANDIHALER 18 MCG inhalation capsule INHALE CONTENTS OF ONE CAPSULE ONCE A DAY AS DIRECTED  . SYMBICORT 160-4.5 MCG/ACT inhaler INHALE TWO PUFFS INTO THE LUNGS TWICE A DAY  . triamcinolone (KENALOG) 0.025 % cream w/ Cerave - APPLY 1 APPLICATION(S) TOPICAL DAILY FROM THE NECK DOWN  . hydrOXYzine (ATARAX/VISTARIL) 10 MG tablet Take 1 tablet (10 mg total) by mouth 3 (three) times daily as needed. (Patient not taking: Reported on 01/05/2018)  . ipratropium-albuterol (DUONEB) 0.5-2.5 (3) MG/3ML SOLN Take 3 mLs by nebulization every 4 (four) hours as needed. (Patient not taking: Reported on 01/05/2018)  . ranitidine (ZANTAC 75) 75 MG tablet Take 0.5 tablets (37.5 mg total) by mouth 2 (two) times daily.   No facility-administered encounter medications on file as of 01/05/2018.     Activities of Daily Living In your  present state of health, do you have any difficulty performing the following activities: 01/05/2018 09/15/2017  Hearing? N N  Vision? N N  Difficulty concentrating or making decisions? N Y  Walking or climbing stairs? N N  Dressing or bathing? N N  Doing errands, shopping? Y N  Comment Does not drive. -  Preparing Food and eating ? Y -  Comment Does not cook. -  Using the Toilet? N -  In the past six months, have you accidently leaked urine? N -  Do you have problems with loss of bowel control? N -  Managing your Medications? Y -  Comment Daughters manage medications.  -  Managing your Finances? N -  Housekeeping or managing your Housekeeping? N -  Some recent data might be hidden    Patient Care Team: Chrismon, Vickki Muff, PA as PCP - General (Physician Assistant) Minna Merritts, MD as PCP - Cardiology (Cardiology) Erby Pian, MD as Referring Physician (Specialist) Jannet Mantis, MD as Consulting Physician (Dermatology)    Assessment:   This is a routine wellness examination for Brya.  Exercise Activities and Dietary recommendations Current Exercise Habits: Home exercise routine, Type of exercise: Other - see comments;stretching(pedals on sit down bicycle, PT stretches), Time (Minutes): 15(to 20 minutes), Frequency (Times/Week): 3(to 4 days a week), Weekly Exercise (Minutes/Week): 45, Intensity: Mild  Goals    . LIFESTYLE - DECREASE FALLS RISK     Recommended ways to prep house inside and out to prevent falls.        Fall Risk Fall Risk  01/05/2018 09/06/2017 03/06/2017 12/15/2016 10/06/2016  Falls in the past year? Yes No No No No  Number falls in past yr: 2 or more - - - -  Injury with Fall? No - - - -  Follow up Falls prevention discussed - - - -   Is the patient's home free of loose throw rugs in walkways, pet beds, electrical cords, etc?   yes  Grab bars in the bathroom? yes      Handrails on the stairs?   yes      Adequate lighting?   yes  Timed  Get Up and Go performed: N/A  Depression Screen PHQ 2/9 Scores 01/05/2018 01/05/2018 10/05/2016 07/04/2016  PHQ - 2 Score 0 0 1 0  PHQ- 9 Score 0 - - -     Cognitive Function: Pt declined screening today.   MMSE - Mini Mental State Exam 12/15/2016  Orientation to time 1  Orientation to Place 5  Registration 3  Attention/ Calculation 0  Recall 1  Language- name 2 objects 2  Language- repeat 0  Language- follow 3 step command 3  Language- read & follow direction 1  Write a sentence 0  Copy design 0  Total score 16        Immunization History  Administered Date(s) Administered  . Pneumococcal Conjugate-13 06/26/2014  . Pneumococcal Polysaccharide-23 01/17/2001, 07/19/2006, 02/13/2008  . Zoster 02/13/2008    Qualifies for Shingles Vaccine? Due for Shingles vaccine. Declined my offer to administer today. Education has been provided regarding the importance of this vaccine. Pt has been advised to call her insurance company to determine her out of pocket expense. Advised she may also receive this vaccine at her local pharmacy or Health Dept. Verbalized acceptance and understanding.  Screening Tests Health Maintenance  Topic Date Due  . TETANUS/TDAP  05/12/2018 (Originally 01/15/1955)  . INFLUENZA VACCINE  03/08/2018  . DEXA SCAN  Completed  . PNA vac Low Risk Adult  Completed    Cancer Screenings: Lung: Low Dose CT Chest recommended if Age 80-80 years, 30 pack-year currently smoking OR have quit w/in 15years. Patient does not qualify. Breast:  Up to date on Mammogram? Yes   Up to date of Bone Density/Dexa? Yes Colorectal: Up to date  Additional Screenings:  Hepatitis C Screening: N/A     Plan:  I have personally reviewed and addressed the Medicare Annual Wellness questionnaire and have noted the following in the patient's chart:  A. Medical and social history B. Use of alcohol, tobacco or illicit drugs  C. Current medications and supplements D. Functional ability and  status E.  Nutritional status F.  Physical activity G. Advance directives H. List of other physicians I.  Hospitalizations, surgeries, and ER visits in previous 12 months J.  [place] such as hearing and vision if needed, cognitive and depression L. Referrals and appointments - none  In addition, I have reviewed and discussed with patient certain preventive protocols, quality metrics, and best practice recommendations. A written personalized care plan for preventive services as well as general preventive health recommendations were provided to patient.  See attached scanned questionnaire for additional information.   Signed,  Fabio Neighbors, LPN Nurse Health Advisor   Nurse Recommendations: Pt declined the tetanus vaccine today.   Reviewed documentation and recommendations of Nurse Health Advisor. Agree with plan and documentation.

## 2018-01-05 NOTE — Progress Notes (Signed)
Patient: Stephanie [name], Female    DOB: [birth year], 82 y.o.   MRN: [id number] Visit Date: 01/05/2018  Today's Provider: Vernie Murders, PA   Chief Complaint  Patient presents with  . Annual Exam   Subjective:     Complete Physical Stephanie [name] is a 82 y.o. female. She feels fairly well. She reports exercising in the house. She walks the stairs several times per day and she has a stair stepper she uses 1-2 times per day. She reports she is sleeping well.  ----------------------------------------------------------- Patient had AWE prior to visit today.   Review of Systems  Constitutional: Negative.   HENT: Negative.   Eyes: Positive for redness.  Respiratory: Positive for cough and shortness of breath.   Cardiovascular: Positive for leg swelling.  Gastrointestinal: Positive for constipation.  Endocrine: Negative.   Genitourinary: Negative.   Musculoskeletal: Positive for arthralgias.  Skin: Negative.   Allergic/Immunologic: Negative.   Neurological: Positive for dizziness and light-headedness.  Hematological: Bruises/bleeds easily.  Psychiatric/Behavioral: Positive for agitation and confusion. The patient is nervous/anxious and is hyperactive.     Social History   Socioeconomic History  . Marital status: Widowed    Spouse name: Not on file  . Number of children: 4  . Years of education: Not on file  . Highest education level: 12th grade  Occupational History  . Occupation: retired  Scientific laboratory technician  . Financial resource strain: Not hard at all  . Food insecurity:    Worry: Never true    Inability: Never true  . Transportation needs:    Medical: No    Non-medical: No  Tobacco Use  . Smoking status: Former Smoker    Last attempt to quit: 08/07/2001    Years since quitting: 16.4  . Smokeless tobacco: Never Used  . Tobacco comment: QUIT IN 2002  Substance and Sexual Activity  . Alcohol use: No    Alcohol/week: 0.0 oz  . Drug use: No  .  Sexual activity: Not on file  Lifestyle  . Physical activity:    Days per week: Not on file    Minutes per session: Not on file  . Stress: Only a little  Relationships  . Social connections:    Talks on phone: Not on file    Gets together: Not on file    Attends religious service: Not on file    Active member of club or organization: Not on file    Attends meetings of clubs or organizations: Not on file    Relationship status: Not on file  . Intimate partner violence:    Fear of current or ex partner: Not on file    Emotionally abused: Not on file    Physically abused: Not on file    Forced sexual activity: Not on file  Other Topics Concern  . Not on file  Social History Narrative   Lives alone    Past Medical History:  Diagnosis Date  . Arthritis   . Baker's cyst of knee, right    a. 08/2016 U/S: Baker's cyst noted in R popliteal fossa.  . Complication of anesthesia   . COPD (chronic obstructive pulmonary disease) ([place])    a. Home O2 started 08/2017.  Marland Kitchen Coronary artery disease    a. prior h/o stenting ~ 2005.  Marland Kitchen Depression   . Edema   . History of cardiac monitoring    a. 08/2016 s/p LINQ placement following CVA. No AFib to date (09/2017).  Marland Kitchen History  of pneumonia   . Hx of ischemic left MCA stroke    a. 08/2016 large posterior L MCA territory infarct in the setting of LLE DVT and suggestion of PFO on transcranial dopplers w/ bubble study (no PFO reported on echocardiogram)-->Eliquis.  . Left leg DVT ([place])    a. 08/2016 U/S: subacute DVT in prox L popliteal vein w/ chronic superficial thrombosis in the L greater saphenous vein from the mid to prox L calf-->Eliquis.  Marland Kitchen PFO (patent foramen ovale)    a. 08/2016 Abnl TCD w/ bubble study suggesting large PFO. Not reported on echocardiogram.  . PONV (postoperative nausea and vomiting)      Patient Active Problem List   Diagnosis Date Noted  . CAP (community acquired pneumonia) 09/15/2017  . Sepsis ([place]) 09/12/2017  . History  of stroke 09/06/2017  . Essential hypertension 04/14/2017  . PFO (patent foramen ovale)   . DVT (deep venous thrombosis) ([place])   . Cerebrovascular accident (CVA) due to embolism of left middle cerebral artery ([place])   . Expressive aphasia   . Elevated troponin 08/18/2016  . Mild malnutrition ([place]) 08/18/2016  . Acute CVA (cerebrovascular accident) ([place]) 08/17/2016  . Acute exacerbation of chronic obstructive pulmonary disease (COPD) ([place]) 07/19/2016  . Depression 05/03/2016  . Mild dementia 04/19/2016  . COPD, moderate ([place]) 03/05/2015  . Anxiety and depression 03/04/2015  . Arteriosclerosis of coronary artery 03/04/2015  . CAFL (chronic airflow limitation) ([place]) 03/04/2015  . Breathlessness on exertion 03/04/2015  . HLD (hyperlipidemia) 03/04/2015  . Osteopenia 03/04/2015  . Peptic ulcer 03/04/2015  . Amnesia 03/20/2014  . Disordered sleep 03/20/2014  . Allergy to environmental factors 11/14/2013  . Gastroduodenal ulcer 11/14/2013  . GI bleed 11/14/2013    Past Surgical History:  Procedure Laterality Date  . BALLOON ANGIOPLASTY, ARTERY  05/17/2009  . CATARACT EXTRACTION W/PHACO Left 05/24/2016   Procedure: CATARACT EXTRACTION PHACO AND INTRAOCULAR LENS PLACEMENT (IOC);  Surgeon: Birder Robson, MD;  Location: [facility];  Service: Ophthalmology;  Laterality: Left;  Lot# [id number] H [place]:   00:52.3 AP%:   27.3 CDE:  14.26   . CATARACT EXTRACTION W/PHACO Right 06/14/2016   Procedure: CATARACT EXTRACTION PHACO AND INTRAOCULAR LENS PLACEMENT (IOC);  Surgeon: Birder Robson, MD;  Location: [facility] OR[facility];  Service: Ophthalmology;  Laterality: Right;  Lot# [id number] H [place]: 01:13.4 AP%: 24.4 CDE: 17.85  . CORONARY ANGIOPLASTY     STENT  . EP IMPLANTABLE DEVICE N/A 08/19/2016   Procedure: Loop Recorder Insertion;  Surgeon: Will Meredith Leeds, MD;  Location: [place] [facility];  Service: Cardiovascular;  Laterality: N/A;   Her family history includes Alcohol abuse in her father; Cancer in her  mother; Diabetes in her mother; Heart disease in her sister; Hypertension in her father, mother, and sister; Kidney disease in her sister; Pancreatic cancer (age of onset: 42) in her mother; Parkinson's disease in her sister; Suicidality (age of onset: 90) in her father; Varicose Veins in her sister.     Current Outpatient Medications:  .  acetaminophen (TYLENOL) 500 MG tablet, Take 1,000 mg by mouth 2 (two) times daily as needed. , Disp: , Rfl:  .  albuterol (VENTOLIN HFA) 108 (90 Base) MCG/ACT inhaler, Inhale 2 puffs into the lungs every 6 (six) hours as needed., Disp: 3 Inhaler, Rfl: 3 .  amLODipine (NORVASC) 5 MG tablet, TAKE ONE TABLET BY MOUTH EVERY DAY, Disp: 90 tablet, Rfl: 3 .  Calcium Carb-Cholecalciferol (CALCIUM 600+D) 600-800 MG-UNIT TABS, Take 1 tablet by mouth daily. , Disp: ,  Rfl:  .  cetirizine (ZYRTEC) 10 MG tablet, Take 10 mg by mouth daily., Disp: , Rfl:  .  docusate sodium (COLACE) 250 MG capsule, Take 100 mg by mouth at bedtime. , Disp: , Rfl:  .  ELIQUIS 5 MG TABS tablet, TAKE ONE (1) TABLET BY MOUTH TWO (2) TIMES DAILY, Disp: 180 tablet, Rfl: 3 .  gabapentin (NEURONTIN) 100 MG capsule, TAKE 1 CAPSULE (100MG ) BY MOUTH TWICE DAILY, Disp: 180 capsule, Rfl: 3 .  PARoxetine (PAXIL) 10 MG tablet, Take 1-2 tablets (10-20 mg total) by mouth daily. Take 1 tablet (10MG ) in the morning and 2 tablets (20MG ) at bedtime (Patient taking differently: Take 10 mg by mouth daily. Take 1 tablet (10MG ) in the morning and 2 tablets (20MG ) at bedtime), Disp: , Rfl:  .  Spacer/Aero Chamber Mouthpiece MISC, Use spacer on inhalers for better administration., Disp: 1 each, Rfl: 1 .  SPIRIVA HANDIHALER 18 MCG inhalation capsule, INHALE CONTENTS OF ONE CAPSULE ONCE A DAY AS DIRECTED, Disp: 90 capsule, Rfl: 4 .  SYMBICORT 160-4.5 MCG/ACT inhaler, INHALE TWO PUFFS INTO THE LUNGS TWICE A DAY, Disp: 30.6 g, Rfl: 3 .  triamcinolone (KENALOG) 0.025 % cream, w/ Cerave - APPLY 1 APPLICATION(S) TOPICAL DAILY FROM  THE NECK DOWN, Disp: , Rfl: 0 .  hydrOXYzine (ATARAX/VISTARIL) 10 MG tablet, Take 1 tablet (10 mg total) by mouth 3 (three) times daily as needed. (Patient not taking: Reported on 01/05/2018), Disp: 30 tablet, Rfl: 0 .  ipratropium-albuterol (DUONEB) 0.5-2.5 (3) MG/3ML SOLN, Take 3 mLs by nebulization every 4 (four) hours as needed. (Patient not taking: Reported on 01/05/2018), Disp: 1080 mL, Rfl: 3 .  ranitidine (ZANTAC 75) 75 MG tablet, Take 0.5 tablets (37.5 mg total) by mouth 2 (two) times daily., Disp: 30 tablet, Rfl: 2  Patient Care Team: Reha Martinovich, Vickki Muff, PA as PCP - General (Physician Assistant) Minna Merritts, MD as PCP - Cardiology (Cardiology) Erby Pian, MD as Referring Physician (Specialist) Jannet Mantis, MD as Consulting Physician (Dermatology)    Objective:   Vitals: BP 134/60 (BP Location: Right Arm, Patient Position: Sitting, Cuff Size: Normal)   Pulse 88   Temp 97.7 F (36.5 C) (Oral)   Ht 5\' 2"  (1.575 m)   Wt 121 lb 3.2 oz (55 kg)   BMI 22.17 kg/m   Physical Exam  Constitutional: She appears well-developed and well-nourished. No distress.  HENT:  Head: Normocephalic and atraumatic.  Right Ear: Hearing normal.  Left Ear: Hearing normal.  Nose: Nose normal.  Eyes: Conjunctivae and lids are normal. Right eye exhibits no discharge. Left eye exhibits no discharge. No scleral icterus.  Neck: Normal range of motion. Neck supple.  Cardiovascular: Normal rate and regular rhythm.  Pulmonary/Chest: Effort normal and breath sounds normal. No respiratory distress.  Abdominal: Soft. Bowel sounds are normal.  Musculoskeletal: Normal range of motion.  Crepitus and slight swelling in the right knee. Good ROM. Pulses 2+ and symmetric throughout. Grip strength/muscle strength 5/5.  Neurological:  Fair balance. Unchanged expressive aphasia. Cranial nerves grossly intact. DTR's symmetric and 2+ bilaterally throughout. No unilateral muscle weakness. Normal facial  expressions.  Skin: Skin is intact. No lesion and no rash noted.  Psychiatric: She has a normal mood and affect. Her speech is normal and behavior is normal. Thought content normal.    Activities of Daily Living In your present state of health, do you have any difficulty performing the following activities: 01/05/2018 09/15/2017  Hearing? N N  Vision? N  N  Difficulty concentrating or making decisions? N Y  Walking or climbing stairs? N N  Dressing or bathing? N N  Doing errands, shopping? Y N  Comment Does not drive. -  Preparing Food and eating ? Y -  Comment Does not cook. -  Using the Toilet? N -  In the past six months, have you accidently leaked urine? N -  Do you have problems with loss of bowel control? N -  Managing your Medications? Y -  Comment Daughters manage medications.  -  Managing your Finances? N -  Housekeeping or managing your Housekeeping? N -  Some recent data might be hidden   Fall Risk Assessment Fall Risk  01/05/2018 09/06/2017 03/06/2017 12/15/2016 10/06/2016  Falls in the past year? Yes No No No No  Number falls in past yr: 2 or more - - - -  Injury with Fall? No - - - -  Follow up Falls prevention discussed - - - -   Depression Screen PHQ 2/9 Scores 01/05/2018 01/05/2018 10/05/2016 07/04/2016  PHQ - 2 Score 0 0 1 0  PHQ- 9 Score 0 - - -   Assessment & Plan:    Annual Physical Reviewed patient's Family Medical History Reviewed and updated list of patient's medical providers Assessment of cognitive impairment was done Assessed patient's functional ability Established a written schedule for health screening [place] Completed and Reviewed  Exercise Activities and Dietary recommendations Goals    . LIFESTYLE - DECREASE FALLS RISK     Recommended ways to prep house inside and out to prevent falls.        Immunization History  Administered Date(s) Administered  . Pneumococcal Conjugate-13 06/26/2014  . Pneumococcal  Polysaccharide-23 01/17/2001, 07/19/2006, 02/13/2008  . Zoster 02/13/2008    Health Maintenance  Topic Date Due  . TETANUS/TDAP  05/12/2018 (Originally 01/15/1955)  . INFLUENZA VACCINE  03/08/2018  . DEXA SCAN  Completed  . PNA vac Low Risk Adult  Completed     Discussed health benefits of physical activity, and encouraged her to engage in regular exercise appropriate for her age and condition.    ------------------------------------------------------------------------------------------------------------ 1. Annual physical exam General health stable. No significant improvement in aphasia since CVA on 08-17-16 (patient comprehends well but has difficulty getting speech to coincide with thoughts). Immunizations up to date. At her age, not necessary to continue colonoscopy, PAP or mammogram screening tests. Family and patient in agreement.  Will check routine labs.  2. Expressive aphasia Onset with CVA on 08-17-16. Has been through speech therapy with very little improvement over the past year. Therapy has been discontinued and advised family this is stable but may not improve further. No swallowing difficulty. Balance is fair and occasionally has had to sit back down when she stands quickly to prevent falls. Recommend she get a handicap placard to parking due to balance issues, neurologic disorder, arthritis and dyspnea. Recheck CBC, CMP and TSH. - CBC with Differential/Platelet - Comprehensive metabolic panel - TSH  3. Essential hypertension Well controlled and tolerating Amlodipine 5 mg qd without peripheral edema. Recheck routine labs and restrict salt intake. - CBC with Differential/Platelet - Comprehensive metabolic panel - Lipid panel - TSH  4. COPD, moderate ([place]) Still using oxygen at 2 LPM at home prn dyspnea and at night. Spiriva qd, Symbicort 160-4.5 mcg/ACT 2 puffs BID and use of ProAir-HFA 2 puffs QID prn continues to control symptoms. No significant cough or congestion  recently. Also, allows her to walk  and use a stepper for exercise. With severe heat recently, needs a handicap placard for parking close to office/buildings. - CBC with Differential/Platelet  5. Mixed hyperlipidemia Trying to limit fats in diet. Will recheck labs. Cholesterol was higher when checked in Dec. 2018 compared to the previous year. Lipid Panel     Component Value Date/Time   CHOL 260 (H) 08/02/2017 0826   CHOL 203 (H) 07/06/2016 0946   TRIG 111 08/02/2017 0826   HDL 147 08/02/2017 0826   HDL 112 07/06/2016 0946   CHOLHDL 1.8 08/02/2017 0826   VLDL 20 08/18/2016 0333   LDLCALC 92 08/02/2017 0826    - Comprehensive metabolic panel - Lipid panel - TSH  6. Constipation, unspecified constipation type Eating well and BM well controlled with use of Colace and Miralax. Encouraged to drink extra water in diet.    Vernie Murders, PA  [facility]

## 2018-01-06 LAB — CBC WITH DIFFERENTIAL/PLATELET
Basophils Absolute: 0 10*3/uL (ref 0.0–0.2)
Basos: 1 %
EOS (ABSOLUTE): 0.1 10*3/uL (ref 0.0–0.4)
EOS: 2 %
HEMATOCRIT: 38.1 % (ref 34.0–46.6)
HEMOGLOBIN: 11.9 g/dL (ref 11.1–15.9)
Immature Grans (Abs): 0 10*3/uL (ref 0.0–0.1)
Immature Granulocytes: 0 %
LYMPHS ABS: 1.6 10*3/uL (ref 0.7–3.1)
Lymphs: 20 %
MCH: 30.4 pg (ref 26.6–33.0)
MCHC: 31.2 g/dL — AB (ref 31.5–35.7)
MCV: 97 fL (ref 79–97)
MONOCYTES: 3 %
MONOS ABS: 0.3 10*3/uL (ref 0.1–0.9)
Neutrophils Absolute: 5.9 10*3/uL (ref 1.4–7.0)
Neutrophils: 74 %
Platelets: 294 10*3/uL (ref 150–450)
RBC: 3.92 x10E6/uL (ref 3.77–5.28)
RDW: 14.5 % (ref 12.3–15.4)
WBC: 7.9 10*3/uL (ref 3.4–10.8)

## 2018-01-06 LAB — COMPREHENSIVE METABOLIC PANEL
ALBUMIN: 4.3 g/dL (ref 3.5–4.8)
ALK PHOS: 85 IU/L (ref 39–117)
ALT: 12 IU/L (ref 0–32)
AST: 19 IU/L (ref 0–40)
Albumin/Globulin Ratio: 1.6 (ref 1.2–2.2)
BUN / CREAT RATIO: 20 (ref 12–28)
BUN: 25 mg/dL (ref 8–27)
CHLORIDE: 102 mmol/L (ref 96–106)
CO2: 19 mmol/L — ABNORMAL LOW (ref 20–29)
CREATININE: 1.23 mg/dL — AB (ref 0.57–1.00)
Calcium: 9.8 mg/dL (ref 8.7–10.3)
GFR calc Af Amer: 48 mL/min/{1.73_m2} — ABNORMAL LOW (ref >59)
GFR calc non Af Amer: 42 mL/min/{1.73_m2} — ABNORMAL LOW (ref >59)
GLOBULIN, TOTAL: 2.7 g/dL (ref 1.5–4.5)
Glucose: 97 mg/dL (ref 65–99)
POTASSIUM: 4.8 mmol/L (ref 3.5–5.2)
SODIUM: 141 mmol/L (ref 134–144)
Total Protein: 7 g/dL (ref 6.0–8.5)

## 2018-01-06 LAB — LIPID PANEL
CHOLESTEROL TOTAL: 251 mg/dL — AB (ref 100–199)
Chol/HDL Ratio: 2.1 ratio (ref 0.0–4.4)
HDL: 119 mg/dL (ref >39)
LDL CALC: 107 mg/dL — AB (ref 0–99)
Triglycerides: 126 mg/dL (ref 0–149)
VLDL Cholesterol Cal: 25 mg/dL (ref 5–40)

## 2018-01-06 LAB — TSH: TSH: 2.46 u[IU]/mL (ref 0.450–4.500)

## 2018-01-08 ENCOUNTER — Telehealth: Payer: Self-pay

## 2018-01-08 NOTE — Telephone Encounter (Signed)
-----   Message from Margo Common, [place] sent at 01/08/2018  7:42 AM EDT ----- Cholesterol in good shape. Total cholesterol high because good cholesterol (HDL) is so very good. Risk ratio perfect. No further anemia. Kidney function stable but still showing some strain. Drink extra water in diet and recheck in 4 months.

## 2018-01-08 NOTE — Telephone Encounter (Signed)
Patient's daughter Butch Penny advised. They will call back to schedule follow up appointment.

## 2018-01-15 LAB — CUP PACEART REMOTE DEVICE CHECK
Date Time Interrogation Session: 20190517114035
Implantable Pulse Generator Implant Date: 20180112

## 2018-01-17 DIAGNOSIS — J449 Chronic obstructive pulmonary disease, unspecified: Secondary | ICD-10-CM | POA: Diagnosis not present

## 2018-01-22 ENCOUNTER — Other Ambulatory Visit: Payer: Self-pay | Admitting: Family Medicine

## 2018-01-24 ENCOUNTER — Ambulatory Visit (INDEPENDENT_AMBULATORY_CARE_PROVIDER_SITE_OTHER): Payer: PPO | Admitting: *Deleted

## 2018-01-24 DIAGNOSIS — I639 Cerebral infarction, unspecified: Secondary | ICD-10-CM | POA: Diagnosis not present

## 2018-01-24 NOTE — Progress Notes (Signed)
Carelink Summary Report / Loop Recorder 

## 2018-02-16 DIAGNOSIS — J449 Chronic obstructive pulmonary disease, unspecified: Secondary | ICD-10-CM | POA: Diagnosis not present

## 2018-02-26 ENCOUNTER — Ambulatory Visit (INDEPENDENT_AMBULATORY_CARE_PROVIDER_SITE_OTHER): Payer: PPO | Admitting: *Deleted

## 2018-02-26 DIAGNOSIS — I639 Cerebral infarction, unspecified: Secondary | ICD-10-CM | POA: Diagnosis not present

## 2018-02-26 NOTE — Progress Notes (Signed)
Carelink Summary Report / Loop Recorder 

## 2018-02-28 LAB — CUP PACEART REMOTE DEVICE CHECK
Implantable Pulse Generator Implant Date: 20180112
MDC IDC SESS DTM: 20190619120813

## 2018-03-19 DIAGNOSIS — J449 Chronic obstructive pulmonary disease, unspecified: Secondary | ICD-10-CM | POA: Diagnosis not present

## 2018-03-20 ENCOUNTER — Telehealth: Payer: Self-pay | Admitting: Family Medicine

## 2018-03-20 NOTE — Telephone Encounter (Signed)
[facility] faxed a refill request for the following medication. Thanks CC  SPIRIVA HANDIHALER 18 MCG inhalation capsule

## 2018-03-21 ENCOUNTER — Other Ambulatory Visit: Payer: Self-pay | Admitting: Family Medicine

## 2018-03-21 DIAGNOSIS — J439 Emphysema, unspecified: Secondary | ICD-10-CM | POA: Diagnosis not present

## 2018-03-21 DIAGNOSIS — I63412 Cerebral infarction due to embolism of left middle cerebral artery: Secondary | ICD-10-CM

## 2018-03-21 DIAGNOSIS — R4701 Aphasia: Secondary | ICD-10-CM

## 2018-03-22 ENCOUNTER — Other Ambulatory Visit: Payer: Self-pay

## 2018-03-22 MED ORDER — TIOTROPIUM BROMIDE MONOHYDRATE 18 MCG IN CAPS: ORAL_CAPSULE | RESPIRATORY_TRACT | 4 refills | Status: DC

## 2018-03-22 NOTE — Telephone Encounter (Signed)
For [facility]

## 2018-03-22 NOTE — Telephone Encounter (Signed)
Please review for Dennis.    Thanks,   -Laura  

## 2018-04-02 ENCOUNTER — Ambulatory Visit (INDEPENDENT_AMBULATORY_CARE_PROVIDER_SITE_OTHER): Payer: PPO | Admitting: *Deleted

## 2018-04-02 DIAGNOSIS — I639 Cerebral infarction, unspecified: Secondary | ICD-10-CM | POA: Diagnosis not present

## 2018-04-02 NOTE — Progress Notes (Signed)
Carelink Summary Report / Loop Recorder 

## 2018-04-13 LAB — CUP PACEART REMOTE DEVICE CHECK
Implantable Pulse Generator Implant Date: 20180112
MDC IDC SESS DTM: 20190722121107

## 2018-04-24 ENCOUNTER — Other Ambulatory Visit: Payer: Self-pay | Admitting: Family Medicine

## 2018-04-25 ENCOUNTER — Other Ambulatory Visit: Payer: Self-pay | Admitting: Family Medicine

## 2018-05-01 LAB — CUP PACEART REMOTE DEVICE CHECK
Implantable Pulse Generator Implant Date: 20180112
MDC IDC SESS DTM: 20190824123815

## 2018-05-03 ENCOUNTER — Ambulatory Visit (INDEPENDENT_AMBULATORY_CARE_PROVIDER_SITE_OTHER): Payer: PPO | Admitting: *Deleted

## 2018-05-03 DIAGNOSIS — I639 Cerebral infarction, unspecified: Secondary | ICD-10-CM | POA: Diagnosis not present

## 2018-05-04 NOTE — Progress Notes (Signed)
Carelink Summary Report / Loop Recorder 

## 2018-05-07 LAB — CUP PACEART REMOTE DEVICE CHECK
Implantable Pulse Generator Implant Date: 20180112
MDC IDC SESS DTM: 20190926141020

## 2018-05-21 ENCOUNTER — Ambulatory Visit
Admission: RE | Admit: 2018-05-21 | Discharge: 2018-05-21 | Disposition: A | Discharge status: Home / Self Care | Payer: PPO | Source: Ambulatory Visit | Attending: Family Medicine | Admitting: Family Medicine

## 2018-05-21 ENCOUNTER — Other Ambulatory Visit: Payer: Self-pay

## 2018-05-21 ENCOUNTER — Ambulatory Visit (INDEPENDENT_AMBULATORY_CARE_PROVIDER_SITE_OTHER): Payer: PPO | Admitting: Family Medicine

## 2018-05-21 ENCOUNTER — Encounter: Payer: Self-pay | Admitting: Family Medicine

## 2018-05-21 VITALS — BP 140/70 | Temp 98.3°F | Ht 60.0 in | Wt 128.6 lb

## 2018-05-21 DIAGNOSIS — M25561 Pain in right knee: Secondary | ICD-10-CM | POA: Diagnosis not present

## 2018-05-21 DIAGNOSIS — M238X1 Other internal derangements of right knee: Secondary | ICD-10-CM | POA: Insufficient documentation

## 2018-05-21 DIAGNOSIS — K219 Gastro-esophageal reflux disease without esophagitis: Secondary | ICD-10-CM

## 2018-05-21 DIAGNOSIS — G8929 Other chronic pain: Secondary | ICD-10-CM | POA: Diagnosis not present

## 2018-05-21 DIAGNOSIS — M1711 Unilateral primary osteoarthritis, right knee: Secondary | ICD-10-CM | POA: Diagnosis not present

## 2018-05-21 DIAGNOSIS — M25461 Effusion, right knee: Secondary | ICD-10-CM | POA: Insufficient documentation

## 2018-05-21 DIAGNOSIS — R4701 Aphasia: Secondary | ICD-10-CM | POA: Diagnosis not present

## 2018-05-21 DIAGNOSIS — R05 Cough: Secondary | ICD-10-CM | POA: Diagnosis not present

## 2018-05-21 DIAGNOSIS — R058 Other specified cough: Secondary | ICD-10-CM

## 2018-05-21 DIAGNOSIS — E782 Mixed hyperlipidemia: Secondary | ICD-10-CM | POA: Diagnosis not present

## 2018-05-21 MED ORDER — OMEPRAZOLE 20 MG PO CPDR: 20.0000 mg | DELAYED_RELEASE_CAPSULE | Freq: Every day | ORAL | 3 refills | Status: DC

## 2018-05-21 NOTE — Progress Notes (Signed)
Patient: Stephanie [name] Female    DOB: 25-Apr-1936   82 y.o.   MRN: [id number] Visit Date: 05/21/2018  Today's Provider: Vernie Murders, PA   Chief Complaint  Patient presents with  . Gastroesophageal Reflux  . right knee swelling    since july 2019   Subjective:    HPI  Pt reports acid reflux acting up a lot and the zantac and tums isn't working well.  She has burning in throat and sounds like congestion.  Also pt reports some swelling in right knee for about three months since July 2019.  Pt also reports having SOB when walking and makes her want to use the inhaler more than needed. Her pulmonologist stopped the oxygen and she moved to a ground floor apartment. Using Albuterol less frequent and states she feels well today. Confirms having a little winded sensation at times from her COPD.    Past Medical History:  Diagnosis Date  . Arthritis   . Baker's cyst of knee, right    a. 08/2016 U/S: Baker's cyst noted in R popliteal fossa.  . Complication of anesthesia   . COPD (chronic obstructive pulmonary disease) ([place])    a. Home O2 started 08/2017.  Marland Kitchen Coronary artery disease    a. prior h/o stenting ~ 2005.  Marland Kitchen Depression   . Edema   . History of cardiac monitoring    a. 08/2016 s/p LINQ placement following CVA. No AFib to date (09/2017).  Marland Kitchen History of pneumonia   . Hx of ischemic left MCA stroke    a. 08/2016 large posterior L MCA territory infarct in the setting of LLE DVT and suggestion of PFO on transcranial dopplers w/ bubble study (no PFO reported on echocardiogram)-->Eliquis.  . Left leg DVT ([place])    a. 08/2016 U/S: subacute DVT in prox L popliteal vein w/ chronic superficial thrombosis in the L greater saphenous vein from the mid to prox L calf-->Eliquis.  Marland Kitchen PFO (patent foramen ovale)    a. 08/2016 Abnl TCD w/ bubble study suggesting large PFO. Not reported on echocardiogram.  . PONV (postoperative nausea and vomiting)    Past Surgical History:  Procedure  Laterality Date  . BALLOON ANGIOPLASTY, ARTERY  05/17/2009  . CATARACT EXTRACTION W/PHACO Left 05/24/2016   Procedure: CATARACT EXTRACTION PHACO AND INTRAOCULAR LENS PLACEMENT (IOC);  Surgeon: Birder Robson, MD;  Location: [facility];  Service: Ophthalmology;  Laterality: Left;  Lot# [id number] H [place]:   00:52.3 AP%:   27.3 CDE:  14.26   . CATARACT EXTRACTION W/PHACO Right 06/14/2016   Procedure: CATARACT EXTRACTION PHACO AND INTRAOCULAR LENS PLACEMENT (IOC);  Surgeon: Birder Robson, MD;  Location: [facility];  Service: Ophthalmology;  Laterality: Right;  Lot# [id number] H [place]: 01:13.4 AP%: 24.4 CDE: 17.85  . CORONARY ANGIOPLASTY     STENT  . EP IMPLANTABLE DEVICE N/A 08/19/2016   Procedure: Loop Recorder Insertion;  Surgeon: Will Meredith Leeds, MD;  Location: [facility];  Service: Cardiovascular;  Laterality: N/A;   Family History  Problem Relation Age of Onset  . Hypertension Mother   . Diabetes Mother   . Pancreatic cancer Mother 16  . Cancer Mother   . Suicidality Father 73  . Hypertension Father   . Alcohol abuse Father   . Hypertension Sister   . Heart disease Sister   . Kidney disease Sister   . Varicose Veins Sister   . Parkinson's disease Sister    Allergies  Allergen Reactions  .  Duloxetine Hcl     unknown  . Influenza Vaccines     unknown  . Venlafaxine     GI distress    Current Outpatient Medications:  .  acetaminophen (TYLENOL) 500 MG tablet, Take 1,000 mg by mouth 2 (two) times daily as needed. , Disp: , Rfl:  .  albuterol (VENTOLIN HFA) 108 (90 Base) MCG/ACT inhaler, Inhale 2 puffs into the lungs every 6 (six) hours as needed., Disp: 3 Inhaler, Rfl: 3 .  amLODipine (NORVASC) 5 MG tablet, TAKE ONE TABLET BY MOUTH EVERY DAY, Disp: 90 tablet, Rfl: 3 .  Calcium Carb-Cholecalciferol (CALCIUM 600+D) 600-800 MG-UNIT TABS, Take 1 tablet by mouth daily. , Disp: , Rfl:  .  cetirizine (ZYRTEC) 10 MG tablet, Take 10 mg by mouth daily., Disp: , Rfl:  .  docusate  sodium (COLACE) 250 MG capsule, Take 100 mg by mouth at bedtime. , Disp: , Rfl:  .  ELIQUIS 5 MG TABS tablet, TAKE ONE TABLET TWICE DAILY, Disp: 60 tablet, Rfl: 2 .  gabapentin (NEURONTIN) 100 MG capsule, TAKE 1 CAPSULE (100MG ) BY MOUTH TWICE DAILY, Disp: 180 capsule, Rfl: 3 .  hydrOXYzine (ATARAX/VISTARIL) 10 MG tablet, Take 1 tablet (10 mg total) by mouth 3 (three) times daily as needed. (Patient not taking: Reported on 01/05/2018), Disp: 30 tablet, Rfl: 0 .  ipratropium-albuterol (DUONEB) 0.5-2.5 (3) MG/3ML SOLN, Take 3 mLs by nebulization every 4 (four) hours as needed. (Patient not taking: Reported on 01/05/2018), Disp: 1080 mL, Rfl: 3 .  PARoxetine (PAXIL) 10 MG tablet, TAKE 1 TABLET (10MG ) IN THE MORNING AND 2 TABLETS (20MG ) AT BEDTIME, Disp: 270 tablet, Rfl: 1 .  ranitidine (ZANTAC 75) 75 MG tablet, Take 0.5 tablets (37.5 mg total) by mouth 2 (two) times daily., Disp: 30 tablet, Rfl: 2 .  Spacer/Aero Chamber Mouthpiece MISC, Use spacer on inhalers for better administration., Disp: 1 each, Rfl: 1 .  SYMBICORT 160-4.5 MCG/ACT inhaler, INHALE TWO PUFFS INTO THE LUNGS TWICE A DAY, Disp: 30.6 Inhaler, Rfl: 3 .  tiotropium (SPIRIVA HANDIHALER) 18 MCG inhalation capsule, INHALE CONTENTS OF ONE CAPSULE ONCE A DAY AS DIRECTED, Disp: 90 capsule, Rfl: 4 .  triamcinolone (KENALOG) 0.025 % cream, w/ Cerave - APPLY 1 APPLICATION(S) TOPICAL DAILY FROM THE NECK DOWN, Disp: , Rfl: 0  Review of Systems  Constitutional: Negative.   HENT: Positive for congestion and ear pain. Negative for dental problem, drooling, ear discharge, facial swelling, hearing loss, mouth sores, nosebleeds, postnasal drip, rhinorrhea, sinus pressure, sinus pain, sneezing, sore throat, tinnitus, trouble swallowing and voice change.   Eyes: Negative.   Respiratory: Positive for cough and shortness of breath. Negative for apnea, choking, chest tightness, wheezing and stridor.   Cardiovascular: Negative.   Gastrointestinal: Negative.     Endocrine: Negative.   Genitourinary: Negative.   Musculoskeletal: Positive for arthralgias, joint swelling, myalgias, neck pain and neck stiffness. Negative for back pain and gait problem.  Skin: Negative.   Allergic/Immunologic: Negative.   Neurological: Negative.   Hematological: Negative.   Psychiatric/Behavioral: Negative.    Social History   Tobacco Use  . Smoking status: Former Smoker    Last attempt to quit: 08/07/2001    Years since quitting: 16.7  . Smokeless tobacco: Never Used  . Tobacco comment: QUIT IN 2002  Substance Use Topics  . Alcohol use: No    Alcohol/week: 0.0 standard drinks   Objective:   BP 140/70 (BP Location: Right Arm, Patient Position: Sitting, Cuff Size: Normal)   Temp 98.3  F (36.8 C) (Oral)   Ht 5' (1.524 m)   Wt 128 lb 9.6 oz (58.3 kg)   SpO2 98%   BMI 25.12 kg/m  Vitals:   05/21/18 1327  BP: 140/70  Temp: 98.3 F (36.8 C)  TempSrc: Oral  SpO2: 98%  Weight: 128 lb 9.6 oz (58.3 kg)  Height: 5' (1.524 m)   Physical Exam  Constitutional: She is oriented to person, place, and time. She appears well-developed and well-nourished. No distress.  HENT:  Head: Normocephalic and atraumatic.  Right Ear: Hearing and external ear normal.  Left Ear: Hearing and external ear normal.  Nose: Nose normal.  Eyes: Conjunctivae, EOM and lids are normal. Right eye exhibits no discharge. Left eye exhibits no discharge. No scleral icterus.  Neck: Normal range of motion. Neck supple.  Cardiovascular: Normal rate and regular rhythm.  Pulmonary/Chest: Effort normal and breath sounds normal. No respiratory distress.  Abdominal: Soft. Bowel sounds are normal.  Musculoskeletal: Normal range of motion.  Crepitus both knees. Swelling right knee. Pulses normal. Varicose veins both lower legs.  Lymphadenopathy:    She has no cervical adenopathy.  Neurological: She is alert and oriented to person, place, and time.  Skin: Skin is intact. No lesion and no rash  noted.  Psychiatric: She has a normal mood and affect. Her speech is normal and behavior is normal. Thought content normal.  Unable to assess orientation due to expressive aphasia secondary to past CVA.      Assessment & Plan:     1. Recurrent dry cough Onset over the past 2 months without sputum production or fever. Occurs more frequently at night and notices some "burning" in her throat. Suspect cough secondary to GERD. Will refill the Omeprazole and encouraged her to continue it daily for the next month. May need referral to GI if persistent and may need upper endoscopy. - CBC with Differential/Platelet - omeprazole (PRILOSEC) 20 MG capsule; Take 1 capsule (20 mg total) by mouth daily.  Dispense: 30 capsule; Refill: 3  2. Expressive aphasia Onset post CVA in January 2018. No swallowing difficulties. No significant improvement despite speech therapy. Still on Eliquis and periodically followed by Dr. Leonie Man (neurologist). Recheck routine labs. - CBC with Differential/Platelet - Comprehensive metabolic panel  3. Mixed hyperlipidemia Trying to limit fats in diet. Recheck CMP, TSH and Lipid Panel. May need statin pending reports. - Comprehensive metabolic panel - Lipid panel - TSH  4. Gastroesophageal reflux disease, esophagitis presence not specified Some cough the past month that primarily occurs at night. Suspect secondary to GERD. Advised to take the Prilosec daily, as this controls symptoms of reflux. No hemoptysis, hematochezia or melena. Recheck labs. If GERD control not maintained, will need follow up with GI for possible endoscopy. - CBC with Differential/Platelet - Comprehensive metabolic panel - omeprazole (PRILOSEC) 20 MG capsule; Take 1 capsule (20 mg total) by mouth daily.  Dispense: 30 capsule; Refill: 3  5. Chronic pain of right knee Intermittent aching pains with effusion. Had been taken off NSAID's when she was started on Eliquis. Some enlargement of the right knee with  crepitus today. Will get CBC to rule out infection and check x-ray to assess suspected DJD. - DG Knee Complete 4 Views Right - CBC with Differential/Platelet       Vernie Murders, PA  [facility]

## 2018-05-28 DIAGNOSIS — R4701 Aphasia: Secondary | ICD-10-CM | POA: Diagnosis not present

## 2018-05-28 DIAGNOSIS — E782 Mixed hyperlipidemia: Secondary | ICD-10-CM | POA: Diagnosis not present

## 2018-05-28 DIAGNOSIS — G8929 Other chronic pain: Secondary | ICD-10-CM | POA: Diagnosis not present

## 2018-05-28 DIAGNOSIS — R05 Cough: Secondary | ICD-10-CM | POA: Diagnosis not present

## 2018-05-28 DIAGNOSIS — K219 Gastro-esophageal reflux disease without esophagitis: Secondary | ICD-10-CM | POA: Diagnosis not present

## 2018-05-28 DIAGNOSIS — M25561 Pain in right knee: Secondary | ICD-10-CM | POA: Diagnosis not present

## 2018-05-29 LAB — COMPREHENSIVE METABOLIC PANEL
A/G RATIO: 2.1 (ref 1.2–2.2)
ALK PHOS: 80 IU/L (ref 39–117)
ALT: 12 IU/L (ref 0–32)
AST: 14 IU/L (ref 0–40)
Albumin: 4.1 g/dL (ref 3.5–4.7)
BILIRUBIN TOTAL: 0.2 mg/dL (ref 0.0–1.2)
BUN/Creatinine Ratio: 20 (ref 12–28)
BUN: 26 mg/dL (ref 8–27)
CALCIUM: 9.6 mg/dL (ref 8.7–10.3)
CHLORIDE: 105 mmol/L (ref 96–106)
CO2: 25 mmol/L (ref 20–29)
Creatinine, Ser: 1.32 mg/dL — ABNORMAL HIGH (ref 0.57–1.00)
GFR calc Af Amer: 44 mL/min/{1.73_m2} — ABNORMAL LOW (ref >59)
GFR, EST NON AFRICAN AMERICAN: 38 mL/min/{1.73_m2} — AB (ref >59)
Globulin, Total: 2 g/dL (ref 1.5–4.5)
Glucose: 95 mg/dL (ref 65–99)
POTASSIUM: 4.8 mmol/L (ref 3.5–5.2)
Sodium: 143 mmol/L (ref 134–144)
Total Protein: 6.1 g/dL (ref 6.0–8.5)

## 2018-05-29 LAB — CBC WITH DIFFERENTIAL/PLATELET
Basophils Absolute: 0.1 10*3/uL (ref 0.0–0.2)
Basos: 1 %
EOS (ABSOLUTE): 0.1 10*3/uL (ref 0.0–0.4)
EOS: 2 %
Hematocrit: 33.4 % — ABNORMAL LOW (ref 34.0–46.6)
Hemoglobin: 11.1 g/dL (ref 11.1–15.9)
IMMATURE GRANULOCYTES: 0 %
Immature Grans (Abs): 0 10*3/uL (ref 0.0–0.1)
LYMPHS: 31 %
Lymphocytes Absolute: 1.9 10*3/uL (ref 0.7–3.1)
MCH: 31.2 pg (ref 26.6–33.0)
MCHC: 33.2 g/dL (ref 31.5–35.7)
MCV: 94 fL (ref 79–97)
MONOS ABS: 0.5 10*3/uL (ref 0.1–0.9)
Monocytes: 9 %
NEUTROS PCT: 57 %
Neutrophils Absolute: 3.6 10*3/uL (ref 1.4–7.0)
Platelets: 286 10*3/uL (ref 150–450)
RBC: 3.56 x10E6/uL — AB (ref 3.77–5.28)
RDW: 12.9 % (ref 12.3–15.4)
WBC: 6.3 10*3/uL (ref 3.4–10.8)

## 2018-05-29 LAB — LIPID PANEL
CHOL/HDL RATIO: 2 ratio (ref 0.0–4.4)
Cholesterol, Total: 217 mg/dL — ABNORMAL HIGH (ref 100–199)
HDL: 107 mg/dL (ref >39)
LDL Calculated: 94 mg/dL (ref 0–99)
TRIGLYCERIDES: 82 mg/dL (ref 0–149)
VLDL Cholesterol Cal: 16 mg/dL (ref 5–40)

## 2018-05-29 LAB — TSH: TSH: 2.17 u[IU]/mL (ref 0.450–4.500)

## 2018-05-29 NOTE — Progress Notes (Signed)
Increase by taking one twice a day.

## 2018-06-05 ENCOUNTER — Ambulatory Visit (INDEPENDENT_AMBULATORY_CARE_PROVIDER_SITE_OTHER): Payer: PPO | Admitting: *Deleted

## 2018-06-05 DIAGNOSIS — I639 Cerebral infarction, unspecified: Secondary | ICD-10-CM | POA: Diagnosis not present

## 2018-06-06 NOTE — Progress Notes (Signed)
Carelink Summary Report / Loop Recorder 

## 2018-06-09 IMAGING — DX DG CHEST 1V
1 series · 1 of 1 positions shown · non-contrast
Comparison: 07/19/2016

CLINICAL DATA: Respiratory failure

EXAM:
CHEST 1 VIEW

[chest ap]
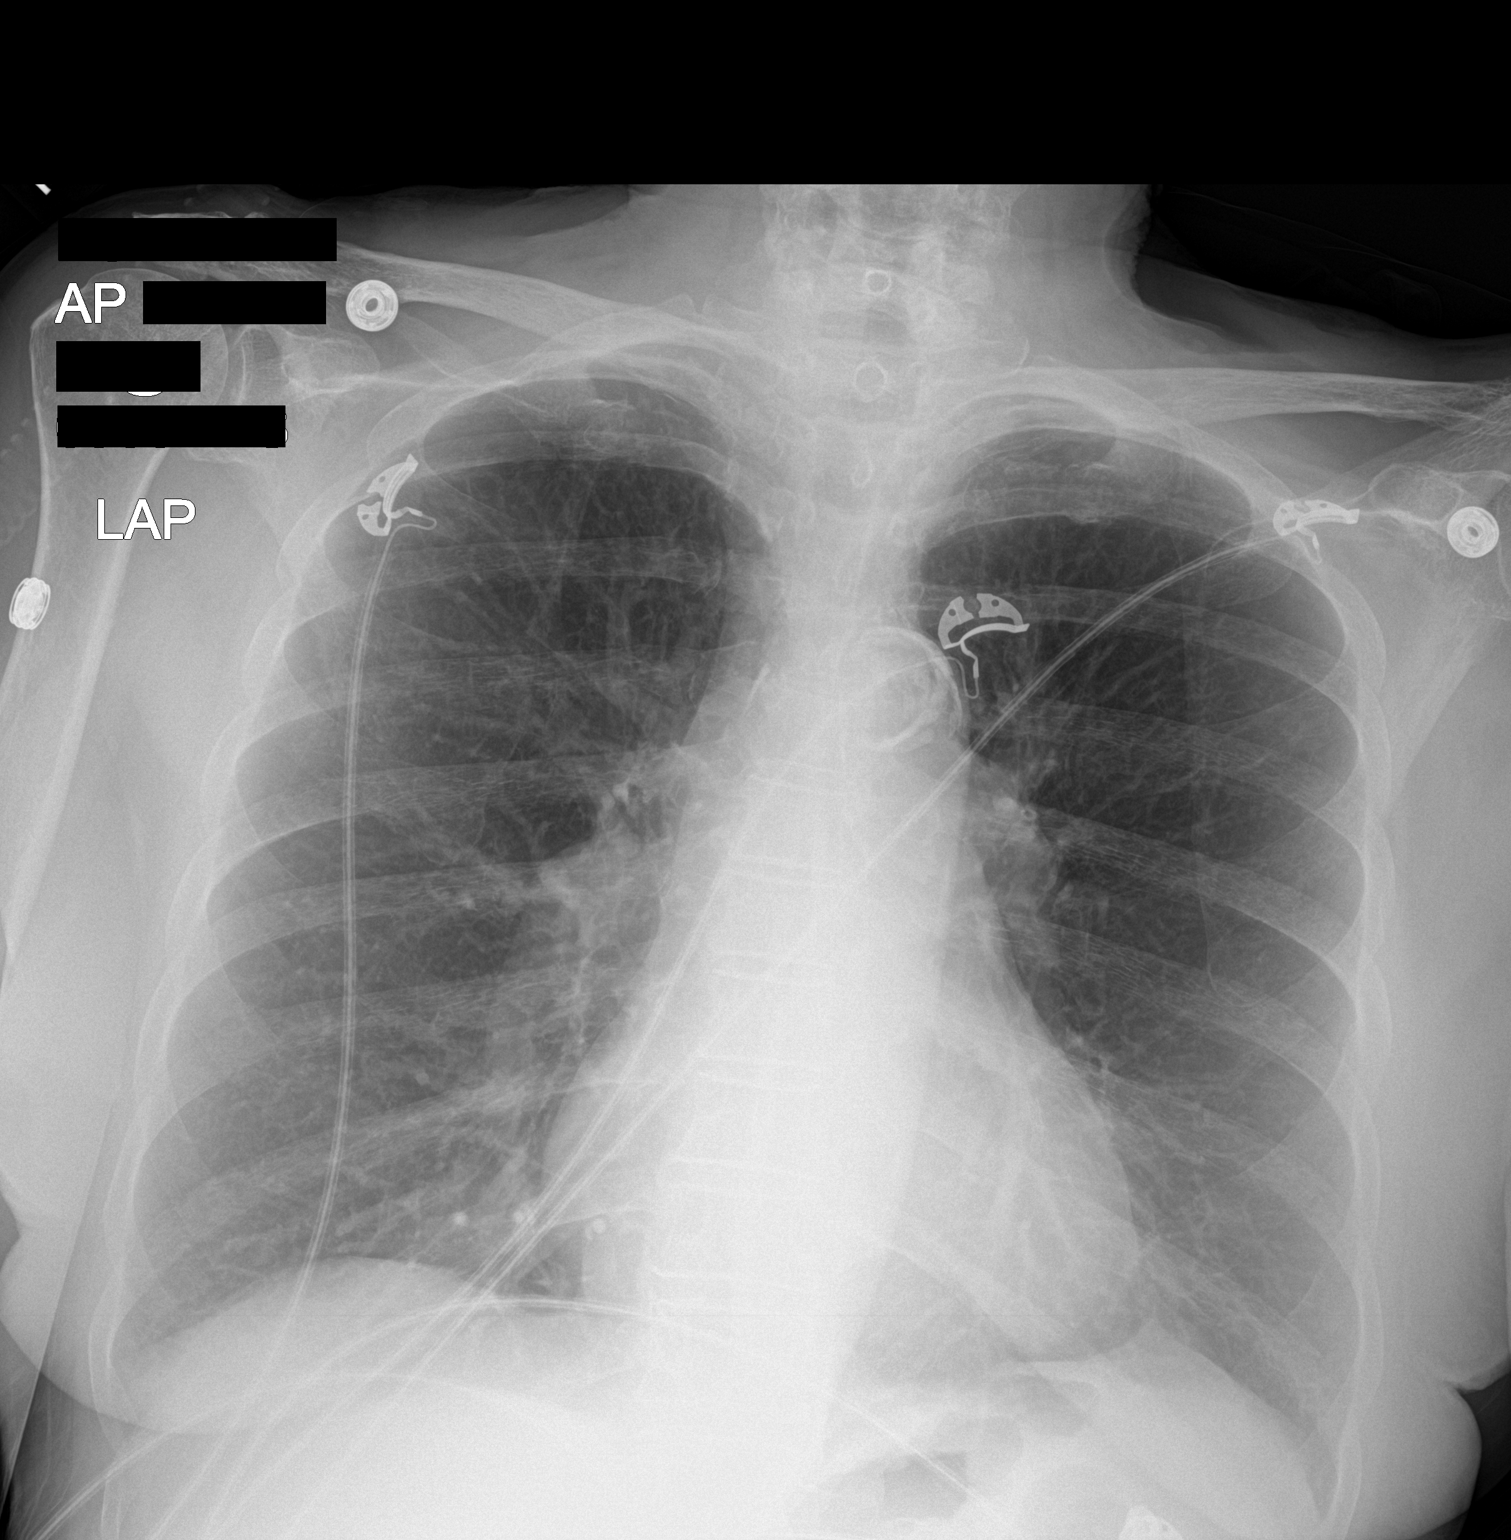

[1 of 1 positions shown; findings below may reference images not displayed]

FINDINGS: Cardiomediastinal silhouette is stable. Atherosclerotic
calcifications of thoracic aorta. Stable left basilar scarring. No
infiltrate or pulmonary edema.
IMPRESSION: No active disease.

## 2018-06-13 ENCOUNTER — Other Ambulatory Visit: Payer: Self-pay | Admitting: Family Medicine

## 2018-06-13 ENCOUNTER — Encounter: Payer: Self-pay | Admitting: Family Medicine

## 2018-06-13 DIAGNOSIS — R4701 Aphasia: Secondary | ICD-10-CM

## 2018-06-13 DIAGNOSIS — I63412 Cerebral infarction due to embolism of left middle cerebral artery: Secondary | ICD-10-CM

## 2018-06-27 LAB — CUP PACEART REMOTE DEVICE CHECK
Date Time Interrogation Session: 20191029143924
Implantable Pulse Generator Implant Date: 20180112

## 2018-07-09 ENCOUNTER — Ambulatory Visit (INDEPENDENT_AMBULATORY_CARE_PROVIDER_SITE_OTHER): Payer: PPO

## 2018-07-09 DIAGNOSIS — I639 Cerebral infarction, unspecified: Secondary | ICD-10-CM | POA: Diagnosis not present

## 2018-07-09 NOTE — Progress Notes (Signed)
Carelink Summary Report / Loop Recorder 

## 2018-07-25 ENCOUNTER — Other Ambulatory Visit: Payer: Self-pay | Admitting: Family Medicine

## 2018-08-03 LAB — CUP PACEART REMOTE DEVICE CHECK
Date Time Interrogation Session: 20191201153805
Implantable Pulse Generator Implant Date: 20180112

## 2018-08-10 ENCOUNTER — Other Ambulatory Visit: Payer: Self-pay | Admitting: Family Medicine

## 2018-08-10 ENCOUNTER — Ambulatory Visit (INDEPENDENT_AMBULATORY_CARE_PROVIDER_SITE_OTHER): Payer: PPO

## 2018-08-10 DIAGNOSIS — I639 Cerebral infarction, unspecified: Secondary | ICD-10-CM | POA: Diagnosis not present

## 2018-08-10 DIAGNOSIS — K219 Gastro-esophageal reflux disease without esophagitis: Secondary | ICD-10-CM

## 2018-08-10 DIAGNOSIS — R058 Other specified cough: Secondary | ICD-10-CM

## 2018-08-10 DIAGNOSIS — R05 Cough: Secondary | ICD-10-CM

## 2018-08-10 LAB — CUP PACEART REMOTE DEVICE CHECK
Date Time Interrogation Session: 20200103130338
Implantable Pulse Generator Implant Date: 20180112

## 2018-08-13 NOTE — Progress Notes (Signed)
Carelink Summary Report / Loop Recorder 

## 2018-08-20 ENCOUNTER — Telehealth: Payer: Self-pay | Admitting: Family Medicine

## 2018-08-20 ENCOUNTER — Other Ambulatory Visit: Payer: Self-pay | Admitting: Family Medicine

## 2018-08-20 DIAGNOSIS — D649 Anemia, unspecified: Secondary | ICD-10-CM

## 2018-08-20 DIAGNOSIS — I63412 Cerebral infarction due to embolism of left middle cerebral artery: Secondary | ICD-10-CM

## 2018-08-20 DIAGNOSIS — I10 Essential (primary) hypertension: Secondary | ICD-10-CM

## 2018-08-20 DIAGNOSIS — Z8673 Personal history of transient ischemic attack (TIA), and cerebral infarction without residual deficits: Secondary | ICD-10-CM

## 2018-08-20 DIAGNOSIS — E782 Mixed hyperlipidemia: Secondary | ICD-10-CM

## 2018-08-20 NOTE — Telephone Encounter (Signed)
Future order placed in chart to be released when patient comes by to get labs done.

## 2018-08-20 NOTE — Telephone Encounter (Signed)
Patient's daughter advised as directed below. Advised that lab requisition printed and placed up front.

## 2018-08-20 NOTE — Telephone Encounter (Signed)
Patient's daughter called stating Laniyah has an appt. On Jan. 23 with Simona Huh.   She wants to go ahead and get labs drawn before appt so Simona Huh will have results the day of.    Can you please get the order ready and call Becky when its done.

## 2018-08-20 NOTE — Telephone Encounter (Signed)
Please Review

## 2018-08-24 DIAGNOSIS — E782 Mixed hyperlipidemia: Secondary | ICD-10-CM | POA: Diagnosis not present

## 2018-08-24 DIAGNOSIS — D649 Anemia, unspecified: Secondary | ICD-10-CM | POA: Diagnosis not present

## 2018-08-24 DIAGNOSIS — I10 Essential (primary) hypertension: Secondary | ICD-10-CM | POA: Diagnosis not present

## 2018-08-24 DIAGNOSIS — Z8673 Personal history of transient ischemic attack (TIA), and cerebral infarction without residual deficits: Secondary | ICD-10-CM | POA: Diagnosis not present

## 2018-08-25 LAB — COMPREHENSIVE METABOLIC PANEL
ALBUMIN: 4.1 g/dL (ref 3.5–4.7)
ALT: 14 IU/L (ref 0–32)
AST: 16 IU/L (ref 0–40)
Albumin/Globulin Ratio: 1.9 (ref 1.2–2.2)
Alkaline Phosphatase: 95 IU/L (ref 39–117)
BUN/Creatinine Ratio: 19 (ref 12–28)
BUN: 28 mg/dL — ABNORMAL HIGH (ref 8–27)
Bilirubin Total: <0.2 mg/dL (ref 0.0–1.2)
CO2: 23 mmol/L (ref 20–29)
Calcium: 9.7 mg/dL (ref 8.7–10.3)
Chloride: 103 mmol/L (ref 96–106)
Creatinine, Ser: 1.45 mg/dL — ABNORMAL HIGH (ref 0.57–1.00)
GFR calc Af Amer: 39 mL/min/{1.73_m2} — ABNORMAL LOW (ref >59)
GFR calc non Af Amer: 34 mL/min/{1.73_m2} — ABNORMAL LOW (ref >59)
GLOBULIN, TOTAL: 2.2 g/dL (ref 1.5–4.5)
Glucose: 83 mg/dL (ref 65–99)
Potassium: 4.5 mmol/L (ref 3.5–5.2)
Sodium: 140 mmol/L (ref 134–144)
Total Protein: 6.3 g/dL (ref 6.0–8.5)

## 2018-08-25 LAB — CBC WITH DIFFERENTIAL/PLATELET
Basophils Absolute: 0.1 10*3/uL (ref 0.0–0.2)
Basos: 1 %
EOS (ABSOLUTE): 0.2 10*3/uL (ref 0.0–0.4)
EOS: 2 %
Hematocrit: 32.1 % — ABNORMAL LOW (ref 34.0–46.6)
Hemoglobin: 10.6 g/dL — ABNORMAL LOW (ref 11.1–15.9)
Immature Grans (Abs): 0 10*3/uL (ref 0.0–0.1)
Immature Granulocytes: 1 %
Lymphocytes Absolute: 1.5 10*3/uL (ref 0.7–3.1)
Lymphs: 21 %
MCH: 31.2 pg (ref 26.6–33.0)
MCHC: 33 g/dL (ref 31.5–35.7)
MCV: 94 fL (ref 79–97)
Monocytes Absolute: 0.6 10*3/uL (ref 0.1–0.9)
Monocytes: 8 %
NEUTROS PCT: 67 %
Neutrophils Absolute: 4.9 10*3/uL (ref 1.4–7.0)
PLATELETS: 289 10*3/uL (ref 150–450)
RBC: 3.4 x10E6/uL — AB (ref 3.77–5.28)
RDW: 13 % (ref 11.7–15.4)
WBC: 7.2 10*3/uL (ref 3.4–10.8)

## 2018-08-25 LAB — LIPID PANEL
Chol/HDL Ratio: 2.1 ratio (ref 0.0–4.4)
Cholesterol, Total: 229 mg/dL — ABNORMAL HIGH (ref 100–199)
HDL: 107 mg/dL (ref >39)
LDL CALC: 104 mg/dL — AB (ref 0–99)
Triglycerides: 90 mg/dL (ref 0–149)
VLDL CHOLESTEROL CAL: 18 mg/dL (ref 5–40)

## 2018-08-25 LAB — IRON: Iron: 82 ug/dL (ref 27–139)

## 2018-08-25 LAB — TSH: TSH: 3.8 u[IU]/mL (ref 0.450–4.500)

## 2018-08-28 ENCOUNTER — Telehealth: Payer: Self-pay

## 2018-08-28 NOTE — Telephone Encounter (Signed)
Nance Pew who is on patient's DPR advised as directed below. Per daughter patient's is scheduled to come see provider on 08/30/2018. Reports that one of her sister is coming with her because they have questions and also for provider to go over the labs wit them.

## 2018-08-28 NOTE — Telephone Encounter (Signed)
-----   Message from [place], [place] sent at 08/27/2018  4:59 PM EST ----- Iron level and Hgb/Hct nearly the same as 3 months ago. Kidney function shows extra stress and need to drink extra water. Cholesterol stable. Schedule recheck appointment in the office in 3-4 weeks.

## 2018-08-29 DIAGNOSIS — R0609 Other forms of dyspnea: Secondary | ICD-10-CM | POA: Diagnosis not present

## 2018-08-29 NOTE — Progress Notes (Signed)
Patient: Stephanie [name] Female    DOB: 03/16/1936   82 y.o.   MRN: [id number] Visit Date: 08/30/2018  Today's Provider: Vernie Murders, PA   Chief Complaint  Patient presents with  . Follow-up   Subjective:     HPI  Patient comes in today to follow-up and to go over labs.Patient was seen by Dr, Vella Kohler yesterday and patient reports that everything looks ok. Patient is here today with [name].  Patient is also complaining about her left knee hurting and the pain radiates down her leg. Reports this is a chronic problem.  Past Medical History:  Diagnosis Date  . Arthritis   . Baker's cyst of knee, right    a. 08/2016 U/S: Baker's cyst noted in R popliteal fossa.  . Complication of anesthesia   . COPD (chronic obstructive pulmonary disease) ([place])    a. Home O2 started 08/2017.  Marland Kitchen Coronary artery disease    a. prior h/o stenting ~ 2005.  Marland Kitchen Depression   . Edema   . History of cardiac monitoring    a. 08/2016 s/p LINQ placement following CVA. No AFib to date (09/2017).  Marland Kitchen History of pneumonia   . Hx of ischemic left MCA stroke    a. 08/2016 large posterior L MCA territory infarct in the setting of LLE DVT and suggestion of PFO on transcranial dopplers w/ bubble study (no PFO reported on echocardiogram)-->Eliquis.  . Left leg DVT ([place])    a. 08/2016 U/S: subacute DVT in prox L popliteal vein w/ chronic superficial thrombosis in the L greater saphenous vein from the mid to prox L calf-->Eliquis.  Marland Kitchen PFO (patent foramen ovale)    a. 08/2016 Abnl TCD w/ bubble study suggesting large PFO. Not reported on echocardiogram.  . PONV (postoperative nausea and vomiting)    Past Surgical History:  Procedure Laterality Date  . BALLOON ANGIOPLASTY, ARTERY  05/17/2009  . CATARACT EXTRACTION W/PHACO Left 05/24/2016   Procedure: CATARACT EXTRACTION PHACO AND INTRAOCULAR LENS PLACEMENT (IOC);  Surgeon: Birder Robson, MD;  Location: [facility];  Service: Ophthalmology;   Laterality: Left;  Lot# [id number] H [place]:   00:52.3 AP%:   27.3 CDE:  14.26   . CATARACT EXTRACTION W/PHACO Right 06/14/2016   Procedure: CATARACT EXTRACTION PHACO AND INTRAOCULAR LENS PLACEMENT (IOC);  Surgeon: Birder Robson, MD;  Location: [facility];  Service: Ophthalmology;  Laterality: Right;  Lot# [id number] H [place]: 01:13.4 AP%: 24.4 CDE: 17.85  . CORONARY ANGIOPLASTY     STENT  . EP IMPLANTABLE DEVICE N/A 08/19/2016   Procedure: Loop Recorder Insertion;  Surgeon: Will Meredith Leeds, MD;  Location: [facility];  Service: Cardiovascular;  Laterality: N/A;   Family History  Problem Relation Age of Onset  . Hypertension Mother   . Diabetes Mother   . Pancreatic cancer Mother 66  . Cancer Mother   . Suicidality Father 33  . Hypertension Father   . Alcohol abuse Father   . Hypertension Sister   . Heart disease Sister   . Kidney disease Sister   . Varicose Veins Sister   . Parkinson's disease Sister    Allergies  Allergen Reactions  . Duloxetine Hcl     unknown  . Influenza Vaccines     unknown  . Venlafaxine     GI distress    Current Outpatient Medications:  .  acetaminophen (TYLENOL) 500 MG tablet, Take 1,000 mg by mouth 2 (two) times daily as needed. , Disp: ,  Rfl:  .  amLODipine (NORVASC) 5 MG tablet, TAKE ONE TABLET BY MOUTH EVERY DAY, Disp: 90 tablet, Rfl: 3 .  Calcium Carb-Cholecalciferol (CALCIUM 600+D) 600-800 MG-UNIT TABS, Take 1 tablet by mouth daily. , Disp: , Rfl:  .  cetirizine (ZYRTEC) 10 MG tablet, Take 10 mg by mouth daily., Disp: , Rfl:  .  docusate sodium (COLACE) 250 MG capsule, Take 100 mg by mouth at bedtime. , Disp: , Rfl:  .  ELIQUIS 5 MG TABS tablet, TAKE 1 TABLET BY MOUTH TWICE A DAY, Disp: 60 tablet, Rfl: 5 .  gabapentin (NEURONTIN) 100 MG capsule, TAKE 1 CAPSULE (100MG ) BY MOUTH TWICE DAILY, Disp: 180 capsule, Rfl: 3 .  omeprazole (PRILOSEC) 20 MG capsule, TAKE 1 CAPSULE BY MOUTH EVERY DAY, Disp: 90 capsule, Rfl: 1 .  PARoxetine (PAXIL) 10 MG  tablet, TAKE 1 TABLET (10MG ) IN THE MORNING AND 2 TABLETS (20MG ) AT BEDTIME, Disp: 270 tablet, Rfl: 1 .  PROAIR HFA 108 (90 Base) MCG/ACT inhaler, INHALE TWO PUFFS INTO LUNGS EVERY 6 HOURS AS NEEDED, Disp: 8.5 Inhaler, Rfl: 11 .  Spacer/Aero Chamber Mouthpiece MISC, Use spacer on inhalers for better administration., Disp: 1 each, Rfl: 1 .  SYMBICORT 160-4.5 MCG/ACT inhaler, INHALE TWO PUFFS INTO THE LUNGS TWICE A DAY, Disp: 30.6 Inhaler, Rfl: 3 .  tiotropium (SPIRIVA HANDIHALER) 18 MCG inhalation capsule, INHALE CONTENTS OF ONE CAPSULE ONCE A DAY AS DIRECTED, Disp: 90 capsule, Rfl: 4 .  triamcinolone (KENALOG) 0.025 % cream, w/ Cerave - APPLY 1 APPLICATION(S) TOPICAL DAILY FROM THE NECK DOWN, Disp: , Rfl: 0  Review of Systems  Constitutional: Negative.   HENT: Negative.   Eyes: Negative.   Respiratory: Negative.   Cardiovascular: Negative.   Gastrointestinal: Negative.   Genitourinary: Negative.   Musculoskeletal: Positive for arthralgias.  Neurological:       Expressive aphasia secondary to CVA. Bizarre construct of sentences.    Social History   Tobacco Use  . Smoking status: Former Smoker    Last attempt to quit: 08/07/2001    Years since quitting: 17.0  . Smokeless tobacco: Never Used  . Tobacco comment: QUIT IN 2002  Substance Use Topics  . Alcohol use: No    Alcohol/week: 0.0 standard drinks     Objective:   BP 133/71 (BP Location: Right Arm, Patient Position: Sitting, Cuff Size: Normal)   Pulse 74   Temp 97.8 F (36.6 C) (Oral)   Resp 16   Wt 127 lb 12.8 oz (58 kg)   SpO2 99%   BMI 24.96 kg/m    Wt Readings from Last 3 Encounters:  08/30/18 127 lb 12.8 oz (58 kg)  05/21/18 128 lb 9.6 oz (58.3 kg)  01/05/18 121 lb 3.2 oz (55 kg)   Vitals:   08/30/18 1557  BP: 133/71  Pulse: 74  Resp: 16  Temp: 97.8 F (36.6 C)  TempSrc: Oral  SpO2: 99%  Weight: 127 lb 12.8 oz (58 kg)   Physical Exam Constitutional:      General: She is not in acute distress.     Appearance: She is well-developed.  HENT:     Head: Normocephalic and atraumatic.     Right Ear: Hearing normal.     Left Ear: Hearing normal.     Nose: Nose normal.  Eyes:     General: Lids are normal. No scleral icterus.       Right eye: No discharge.        Left eye: No discharge.  Conjunctiva/sclera: Conjunctivae normal.  Neck:     Musculoskeletal: Normal range of motion and neck supple.  Cardiovascular:     Rate and Rhythm: Normal rate and regular rhythm.     Pulses: Normal pulses.     Heart sounds: Normal heart sounds.  Pulmonary:     Effort: Pulmonary effort is normal. No respiratory distress.  Abdominal:     General: Bowel sounds are normal.     Palpations: Abdomen is soft.  Musculoskeletal: Normal range of motion.     Comments: Crepitus in the right knee. No peripheral edema and good pulses. DTR's symmetric UE's and LE's.  Skin:    Findings: No lesion or rash.  Neurological:     Mental Status: She is alert.     Motor: No weakness.     Gait: Gait normal.     Deep Tendon Reflexes: Reflexes normal.  Psychiatric:        Attention and Perception: Attention normal.        Mood and Affect: Mood normal.        Behavior: Behavior normal.        Thought Content: Thought content normal.     Comments: Expressive aphasia secondary to CVA.       Assessment & Plan    1. Expressive aphasia Unchanged bizarre sentence construction secondary to expressive aphasia from CVA in 2018. Stable and no unilateral weakness or swallowing difficulties. Still on the Eliquis 5 mg BID and continue follow up with Dr. Leonie Man (neurologist) annually or prn.  2. COPD, moderate ([place]) Stable with use of Spiriva, Symbicort and ProAir-HFA prn. Evaluated by Dr. Raul Del (pulmonologist) yesterday and given a "good report".   3. CKD (chronic kidney disease) stage 3, GFR 30-59 ml/min (HCC) Labs from 08-24-18 shows stage 3 CKD. Encouraged to restrict salt intake. No edema or dyspnea. Should drink plenty  of water daily and recheck levels in 3 months. May need nephrology evaluation if worsening. Recent Results (from the past 2160 hour(s))  CUP PACEART REMOTE DEVICE CHECK     Status: None   Collection Time: 06/05/18  2:39 PM  Result Value Ref Range   Date Time Interrogation Session [id number]    Pulse Generator Manufacturer MERM    Pulse Gen Model [id number] Reveal LINQ    Pulse Gen Serial Number [id number]    Clinic Name [place] [facility]    Implantable Pulse Generator Type ICM/ILR    Implantable Pulse Generator Implant Date [id number]    Eval Rhythm SR with PVC   CUP PACEART REMOTE DEVICE CHECK     Status: None   Collection Time: 07/08/18  3:38 PM  Result Value Ref Range   Date Time Interrogation Session [id number]    Pulse Generator Manufacturer MERM    Pulse Gen Model [id number] Reveal LINQ    Pulse Gen Serial Number [id number]    Clinic Name [facility]    Implantable Pulse Generator Type ICM/ILR    Implantable Pulse Generator Implant Date [id number]    Eval Rhythm NSR   CUP PACEART REMOTE DEVICE CHECK     Status: None   Collection Time: 08/10/18  3:40 PM  Result Value Ref Range   Date Time Interrogation Session [id number]    Pulse Generator Manufacturer [facility]    Pulse Gen Model [id number] Reveal LINQ    Pulse Gen Serial Number [id number]    Clinic Name [place]    Implantable Pulse Generator Type ICM/ILR    Implantable Pulse Generator Implant Date [id number]  Eval Rhythm NSR   Iron     Status: None   Collection Time: 08/24/18  8:03 AM  Result Value Ref Range   Iron 82 27 - 139 ug/dL  TSH     Status: None   Collection Time: 08/24/18  8:03 AM  Result Value Ref Range   TSH 3.800 0.450 - 4.500 uIU/mL  Lipid panel     Status: Abnormal   Collection Time: 08/24/18  8:03 AM  Result Value Ref Range   Cholesterol, Total 229 (H) 100 - 199 mg/dL   Triglycerides 90 0 - 149 mg/dL   HDL 107 >39 mg/dL   VLDL Cholesterol Cal 18 5 - 40 mg/dL   LDL Calculated 104 (H) 0 - 99  mg/dL   Chol/HDL Ratio 2.1 0.0 - 4.4 ratio    Comment:                                   T. Chol/HDL Ratio                                             Men  Women                               1/2 Avg.Risk  3.4    3.3                                   Avg.Risk  5.0    4.4                                2X Avg.Risk  9.6    7.1                                3X Avg.Risk 23.4   11.0   Comprehensive metabolic panel     Status: Abnormal   Collection Time: 08/24/18  8:03 AM  Result Value Ref Range   Glucose 83 65 - 99 mg/dL   BUN 28 (H) 8 - 27 mg/dL   Creatinine, Ser 1.45 (H) 0.57 - 1.00 mg/dL   GFR calc non Af Amer 34 (L) >59 mL/min/1.73   GFR calc Af Amer 39 (L) >59 mL/min/1.73   BUN/Creatinine Ratio 19 12 - 28   Sodium 140 134 - 144 mmol/L   Potassium 4.5 3.5 - 5.2 mmol/L   Chloride 103 96 - 106 mmol/L   CO2 23 20 - 29 mmol/L   Calcium 9.7 8.7 - 10.3 mg/dL   Total Protein 6.3 6.0 - 8.5 g/dL   Albumin 4.1 3.5 - 4.7 g/dL    Comment:     **Effective August 27, 2018 Albumin reference**       interval will be changing to:              Age                Female          Female           0 -  7 days  3.6 - 4.9      3.6 - 4.9           8 - 30 days        3.4 - 4.7      3.4 - 4.7           1 -  6 month       3.7 - 4.8      3.7 - 4.8    7 months -  2 years       3.9 - 5.0      3.9 - 5.0           3 -  5 years       4.0 - 5.0      4.0 - 5.0           6 - 12 years       4.1 - 5.0      4.0 - 5.0          13 - 30 years       4.1 - 5.2      3.9 - 5.0          31 - 50 years       4.0 - 5.0      3.8 - 4.8          51 - 60 years       3.8 - 4.9      3.8 - 4.9          61 - 70 years       3.8 - 4.8      3.8 - 4.8          71 - 80 years       3.7 - 4.7      3.7 - 4.7          81 - 89 years       3.6 - 4.6      3.6 - 4.6              >89 years       3.5 - 4.6      3.5 - 4.6    Globulin, Total 2.2 1.5 - 4.5 g/dL   Albumin/Globulin Ratio 1.9 1.2 - 2.2   Bilirubin Total <0.2 0.0 - 1.2 mg/dL    Alkaline Phosphatase 95 39 - 117 IU/L   AST 16 0 - 40 IU/L   ALT 14 0 - 32 IU/L  CBC with Differential/Platelet     Status: Abnormal   Collection Time: 08/24/18  8:03 AM  Result Value Ref Range   WBC 7.2 3.4 - 10.8 x10E3/uL   RBC 3.40 (L) 3.77 - 5.28 x10E6/uL   Hemoglobin 10.6 (L) 11.1 - 15.9 g/dL   Hematocrit 32.1 (L) 34.0 - 46.6 %   MCV 94 79 - 97 fL   MCH 31.2 26.6 - 33.0 pg   MCHC 33.0 31.5 - 35.7 g/dL   RDW 13.0 11.7 - 15.4 %    Comment:               **Please note reference interval change**   Platelets 289 150 - 450 x10E3/uL   Neutrophils 67 Not Estab. %   Lymphs 21 Not Estab. %   Monocytes 8 Not Estab. %   Eos 2 Not Estab. %   Basos 1 Not Estab. %   Neutrophils Absolute 4.9 1.4 - 7.0 x10E3/uL   Lymphocytes Absolute 1.5 0.7 -  3.1 x10E3/uL   Monocytes Absolute 0.6 0.1 - 0.9 x10E3/uL   EOS (ABSOLUTE) 0.2 0.0 - 0.4 x10E3/uL   Basophils Absolute 0.1 0.0 - 0.2 x10E3/uL   Immature Granulocytes 1 Not Estab. %   Immature Grans (Abs) 0.0 0.0 - 0.1 x10E3/uL   4. Essential hypertension Tolerating Amlodipine 5 mg qd without peripheral edema. BP well controlled.  5. Arthritis of right knee Intermittent pain and swelling. Better today. Usually gets relief from Blue Emu Cream and Tylenol. Recheck prn.     Vernie Murders, PA  [facility]

## 2018-08-30 ENCOUNTER — Encounter: Payer: Self-pay | Admitting: Family Medicine

## 2018-08-30 ENCOUNTER — Ambulatory Visit (INDEPENDENT_AMBULATORY_CARE_PROVIDER_SITE_OTHER): Payer: PPO | Admitting: Family Medicine

## 2018-08-30 VITALS — BP 133/71 | HR 74 | Temp 97.8°F | Resp 16 | Wt 127.8 lb

## 2018-08-30 DIAGNOSIS — I10 Essential (primary) hypertension: Secondary | ICD-10-CM

## 2018-08-30 DIAGNOSIS — N183 Chronic kidney disease, stage 3 unspecified: Secondary | ICD-10-CM

## 2018-08-30 DIAGNOSIS — R4701 Aphasia: Secondary | ICD-10-CM

## 2018-08-30 DIAGNOSIS — M1711 Unilateral primary osteoarthritis, right knee: Secondary | ICD-10-CM

## 2018-08-30 DIAGNOSIS — J449 Chronic obstructive pulmonary disease, unspecified: Secondary | ICD-10-CM

## 2018-09-12 ENCOUNTER — Ambulatory Visit (INDEPENDENT_AMBULATORY_CARE_PROVIDER_SITE_OTHER): Payer: PPO

## 2018-09-12 DIAGNOSIS — I639 Cerebral infarction, unspecified: Secondary | ICD-10-CM | POA: Diagnosis not present

## 2018-09-14 LAB — CUP PACEART REMOTE DEVICE CHECK
Date Time Interrogation Session: 20200205164006
Implantable Pulse Generator Implant Date: 20180112

## 2018-09-21 NOTE — Progress Notes (Signed)
Carelink Summary Report / Loop Recorder 

## 2018-09-26 ENCOUNTER — Other Ambulatory Visit: Payer: Self-pay | Admitting: Family Medicine

## 2018-10-11 ENCOUNTER — Telehealth: Payer: Self-pay

## 2018-10-11 ENCOUNTER — Encounter: Payer: Self-pay | Admitting: Family Medicine

## 2018-10-11 NOTE — Telephone Encounter (Signed)
Will put a letter together, but, she has aged out of jury duty and can opt out of it.

## 2018-10-11 NOTE — Telephone Encounter (Signed)
Please review. Thanks!  

## 2018-10-11 NOTE — Telephone Encounter (Signed)
Advised daughter. Letter placed up front.

## 2018-10-11 NOTE — Telephone Encounter (Signed)
Patient's daughter Stephanie [name] called stating that the patient received notification to report to jury duty on November 05, 2018. Stephanie [name] request that Simona Huh write a doctor statement excusing her from jury duty. Patient has had a stroke in the past and she is not capable sitting through jury duty.

## 2018-10-15 ENCOUNTER — Ambulatory Visit (INDEPENDENT_AMBULATORY_CARE_PROVIDER_SITE_OTHER): Payer: PPO | Admitting: *Deleted

## 2018-10-15 DIAGNOSIS — I639 Cerebral infarction, unspecified: Secondary | ICD-10-CM | POA: Diagnosis not present

## 2018-10-16 LAB — CUP PACEART REMOTE DEVICE CHECK
Implantable Pulse Generator Implant Date: 20180112
MDC IDC SESS DTM: 20200309173952

## 2018-10-18 ENCOUNTER — Other Ambulatory Visit: Payer: Self-pay | Admitting: Family Medicine

## 2018-10-18 DIAGNOSIS — R4701 Aphasia: Secondary | ICD-10-CM

## 2018-10-18 DIAGNOSIS — I63412 Cerebral infarction due to embolism of left middle cerebral artery: Secondary | ICD-10-CM

## 2018-10-18 MED ORDER — GABAPENTIN 100 MG PO CAPS: ORAL_CAPSULE | ORAL | 3 refills | Status: DC

## 2018-10-18 NOTE — Telephone Encounter (Signed)
[facility] faxed refill request for the following medications:  gabapentin (NEURONTIN) 100 MG capsule  90 day supply  Please advise. Thanks TNP

## 2018-10-22 NOTE — Progress Notes (Signed)
Carelink Summary Report / Loop Recorder 

## 2018-11-06 ENCOUNTER — Other Ambulatory Visit: Payer: Self-pay | Admitting: Family Medicine

## 2018-11-19 ENCOUNTER — Ambulatory Visit (INDEPENDENT_AMBULATORY_CARE_PROVIDER_SITE_OTHER): Payer: PPO | Admitting: *Deleted

## 2018-11-19 ENCOUNTER — Other Ambulatory Visit: Payer: Self-pay

## 2018-11-19 DIAGNOSIS — I639 Cerebral infarction, unspecified: Secondary | ICD-10-CM | POA: Diagnosis not present

## 2018-11-19 LAB — CUP PACEART REMOTE DEVICE CHECK
Date Time Interrogation Session: 20200411180949
Implantable Pulse Generator Implant Date: 20180112

## 2018-11-26 NOTE — Progress Notes (Signed)
Carelink Summary Report / Loop Recorder 

## 2018-12-02 ENCOUNTER — Other Ambulatory Visit: Payer: Self-pay | Admitting: Family Medicine

## 2018-12-02 DIAGNOSIS — I63412 Cerebral infarction due to embolism of left middle cerebral artery: Secondary | ICD-10-CM

## 2018-12-02 DIAGNOSIS — R4701 Aphasia: Secondary | ICD-10-CM

## 2018-12-20 ENCOUNTER — Other Ambulatory Visit: Payer: Self-pay

## 2018-12-20 ENCOUNTER — Ambulatory Visit (INDEPENDENT_AMBULATORY_CARE_PROVIDER_SITE_OTHER): Payer: PPO | Admitting: *Deleted

## 2018-12-20 DIAGNOSIS — I639 Cerebral infarction, unspecified: Secondary | ICD-10-CM

## 2018-12-20 LAB — CUP PACEART REMOTE DEVICE CHECK
Date Time Interrogation Session: 20200514200912
Implantable Pulse Generator Implant Date: 20180112

## 2018-12-25 NOTE — Progress Notes (Signed)
Carelink Summary Report / Loop Recorder 

## 2019-01-07 ENCOUNTER — Other Ambulatory Visit: Payer: Self-pay

## 2019-01-07 ENCOUNTER — Ambulatory Visit (INDEPENDENT_AMBULATORY_CARE_PROVIDER_SITE_OTHER): Payer: PPO

## 2019-01-07 DIAGNOSIS — Z0000 Encounter for general adult medical examination without abnormal findings: Secondary | ICD-10-CM | POA: Diagnosis not present

## 2019-01-07 NOTE — Progress Notes (Addendum)
Subjective:   Stephanie [name] is a 83 y.o. female who presents for Medicare Annual (Subsequent) preventive examination.  Review of Systems:  N/A  Cardiac Risk Factors include: advanced age (>47men, >58 women);hypertension     Objective:     Vitals: There were no vitals taken for this visit.  There is no height or weight on file to calculate BMI.  Advanced Directives 01/07/2019 01/05/2018 09/15/2017 09/15/2017 09/12/2017 09/12/2017 10/05/2016  Does Patient Have a Medical Advance Directive? Yes Yes Yes Yes Yes Yes Yes  Type of Paramedic of [place];Living will [place];Living will [place] of facility DNR (pink MOST or yellow form) Out of facility DNR (pink MOST or yellow form);[place];Living will [place];Living will Living will;Healthcare Power of Attorney  Does patient want to make changes to medical advance directive? - - No - Patient declined No - Patient declined No - Patient declined - No - Patient declined  Copy of [place] in Chart? Yes - validated most recent copy scanned in chart (See row information) Yes Yes - - No - copy requested Yes  Would patient like information on creating a medical advance directive? - - - - - - -  Pre-existing out of facility DNR order (yellow form or pink MOST form) - - Yellow form placed in chart (order not valid for inpatient use) Yellow form placed in chart (order not valid for inpatient use) - - -    Tobacco Social History   Tobacco Use  Smoking Status Former Smoker  . Last attempt to quit: 08/07/2001  . Years since quitting: 17.4  Smokeless Tobacco Never Used  Tobacco Comment   QUIT IN 2002     Counseling given: Not Answered Comment: QUIT IN 2002   Clinical Intake:  Pre-visit preparation completed: Yes  Pain : No/denies pain Pain Score: 0-No pain     Nutritional Status: BMI of 19-24  Normal Nutritional  Risks: None Diabetes: No  How often do you need to have someone help you when you read instructions, pamphlets, or other written materials from your doctor or pharmacy?: 1 - Never  Interpreter Needed?: No  Information entered by :: [facility], LPN  Past Medical History:  Diagnosis Date  . Arthritis   . Baker's cyst of knee, right    a. 08/2016 U/S: Baker's cyst noted in R popliteal fossa.  . Complication of anesthesia   . COPD (chronic obstructive pulmonary disease) ([place])    a. Home O2 started 08/2017.  Marland Kitchen Coronary artery disease    a. prior h/o stenting ~ 2005.  Marland Kitchen Depression   . Edema   . History of cardiac monitoring    a. 08/2016 s/p LINQ placement following CVA. No AFib to date (09/2017).  Marland Kitchen History of pneumonia   . Hx of ischemic left MCA stroke    a. 08/2016 large posterior L MCA territory infarct in the setting of LLE DVT and suggestion of PFO on transcranial dopplers w/ bubble study (no PFO reported on echocardiogram)-->Eliquis.  . Left leg DVT ([place])    a. 08/2016 U/S: subacute DVT in prox L popliteal vein w/ chronic superficial thrombosis in the L greater saphenous vein from the mid to prox L calf-->Eliquis.  Marland Kitchen PFO (patent foramen ovale)    a. 08/2016 Abnl TCD w/ bubble study suggesting large PFO. Not reported on echocardiogram.  . PONV (postoperative nausea and vomiting)    Past Surgical  History:  Procedure Laterality Date  . BALLOON ANGIOPLASTY, ARTERY  05/17/2009  . CATARACT EXTRACTION W/PHACO Left 05/24/2016   Procedure: CATARACT EXTRACTION PHACO AND INTRAOCULAR LENS PLACEMENT (IOC);  Surgeon: Birder Robson, MD;  Location: [facility] ORS;  Service: Ophthalmology;  Laterality: Left;  Lot# [id number] H [place]:   00:52.3 AP%:   27.3 CDE:  14.26   . CATARACT EXTRACTION W/PHACO Right 06/14/2016   Procedure: CATARACT EXTRACTION PHACO AND INTRAOCULAR LENS PLACEMENT (IOC);  Surgeon: Birder Robson, MD;  Location: [facility] ORS;  Service: Ophthalmology;  Laterality: Right;  Lot# [id number] H [place]:  01:13.4 AP%: 24.4 CDE: 17.85  . CORONARY ANGIOPLASTY     STENT  . EP IMPLANTABLE DEVICE N/A 08/19/2016   Procedure: Loop Recorder Insertion;  Surgeon: Will Meredith Leeds, MD;  Location: [facility];  Service: Cardiovascular;  Laterality: N/A;   Family History  Problem Relation Age of Onset  . Hypertension Mother   . Diabetes Mother   . Pancreatic cancer Mother 29  . Cancer Mother   . Suicidality Father 22  . Hypertension Father   . Alcohol abuse Father   . Hypertension Sister   . Heart disease Sister   . Kidney disease Sister   . Varicose Veins Sister   . Parkinson's disease Sister    Social History   Socioeconomic History  . Marital status: Widowed    Spouse name: Not on file  . Number of children: 4  . Years of education: Not on file  . Highest education level: 12th grade  Occupational History  . Occupation: retired  Scientific laboratory technician  . Financial resource strain: Not hard at all  . Food insecurity:    Worry: Never true    Inability: Never true  . Transportation needs:    Medical: No    Non-medical: No  Tobacco Use  . Smoking status: Former Smoker    Last attempt to quit: 08/07/2001    Years since quitting: 17.4  . Smokeless tobacco: Never Used  . Tobacco comment: QUIT IN 2002  Substance and Sexual Activity  . Alcohol use: No    Alcohol/week: 0.0 standard drinks  . Drug use: No  . Sexual activity: Not on file  Lifestyle  . Physical activity:    Days per week: 0 days    Minutes per session: 0 min  . Stress: Not at all  Relationships  . Social connections:    Talks on phone: Patient refused    Gets together: Patient refused    Attends religious service: Patient refused    Active member of club or organization: Patient refused    Attends meetings of clubs or organizations: Patient refused    Relationship status: Patient refused  Other Topics Concern  . Not on file  Social History Narrative   Lives alone    Outpatient Encounter Medications as of  01/07/2019  Medication Sig  . acetaminophen (TYLENOL) 500 MG tablet Take 1,000 mg by mouth 2 (two) times daily.   Marland Kitchen amLODipine (NORVASC) 5 MG tablet TAKE ONE TABLET BY MOUTH EVERY DAY  . Calcium Carb-Cholecalciferol (CALCIUM 600+D) 600-800 MG-UNIT TABS Take 1 tablet by mouth daily.   . cetirizine (ZYRTEC) 10 MG tablet Take 10 mg by mouth daily.  Marland Kitchen docusate sodium (COLACE) 250 MG capsule Take 100 mg by mouth at bedtime.   Marland Kitchen ELIQUIS 5 MG TABS tablet TAKE 1 TABLET BY MOUTH TWICE A DAY  . gabapentin (NEURONTIN) 100 MG capsule TAKE 1 CAPSULE (100MG ) BY MOUTH TWICE DAILY  .  Multiple Vitamin (MULTIVITAMIN) tablet Take 1 tablet by mouth daily.  Marland Kitchen omeprazole (PRILOSEC) 20 MG capsule TAKE 1 CAPSULE BY MOUTH EVERY DAY  . PARoxetine (PAXIL) 10 MG tablet TAKE 1 TABLET (10MG ) IN THE MORNING AND 2 TABLETS (20MG ) AT BEDTIME  . PROAIR HFA 108 (90 Base) MCG/ACT inhaler INHALE TWO PUFFS INTO LUNGS EVERY 6 HOURS AS NEEDED  . Spacer/Aero Chamber Mouthpiece MISC Use spacer on inhalers for better administration.  . SYMBICORT 160-4.5 MCG/ACT inhaler INHALE TWO PUFFS INTO THE LUNGS TWICE A DAY  . tiotropium (SPIRIVA HANDIHALER) 18 MCG inhalation capsule INHALE CONTENTS OF ONE CAPSULE ONCE A DAY AS DIRECTED  . triamcinolone (KENALOG) 0.025 % cream w/ Cerave - APPLY 1 APPLICATION(S) TOPICAL DAILY FROM THE NECK DOWN   No facility-administered encounter medications on file as of 01/07/2019.     Activities of Daily Living In your present state of health, do you have any difficulty performing the following activities: 01/07/2019  Hearing? N  Vision? N  Comment Wears eye glasses daily.   Difficulty concentrating or making decisions? N  Walking or climbing stairs? N  Dressing or bathing? N  Doing errands, shopping? N  Preparing Food and eating ? N  Using the Toilet? N  In the past six months, have you accidently leaked urine? N  Do you have problems with loss of bowel control? N  Managing your Medications? N  Managing  your Finances? N  Housekeeping or managing your Housekeeping? N  Some recent data might be hidden    Patient Care Team: Chrismon, Vickki Muff, PA as PCP - General (Physician Assistant) Minna Merritts, MD as PCP - Cardiology (Cardiology) Erby Pian, MD as Referring Physician (Specialist) Jannet Mantis, MD as Consulting Physician (Dermatology)    Assessment:   This is a routine wellness examination for Anikka.  Exercise Activities and Dietary recommendations Current Exercise Habits: The patient does not participate in regular exercise at present, Exercise limited by: orthopedic condition(s)  Goals    . LIFESTYLE - DECREASE FALLS RISK     Recommended ways to prep house inside and out to prevent falls.        Fall Risk: Fall Risk  01/07/2019 05/21/2018 01/05/2018 09/06/2017 03/06/2017  Falls in the past year? 0 No Yes No No  Number falls in past yr: - - 2 or more - -  Injury with Fall? - - No - -  Follow up - - Falls prevention discussed - -    FALL RISK PREVENTION PERTAINING TO THE HOME:  Any stairs in or around the home? No  If so, are there any without handrails? N/A  Home free of loose throw rugs in walkways, pet beds, electrical cords, etc? Yes  Adequate lighting in your home to reduce risk of falls? Yes   ASSISTIVE DEVICES UTILIZED TO PREVENT FALLS:  Life alert? Yes  Use of a cane, walker or w/c? No  Grab bars in the bathroom? Yes  Shower chair or bench in shower? Yes  Elevated toilet seat or a handicapped toilet? No    TIMED UP AND GO:  Was the test performed? No .    Depression Screen PHQ 2/9 Scores 01/07/2019 05/21/2018 01/05/2018 01/05/2018  PHQ - 2 Score 0 0 0 0  PHQ- 9 Score - - 0 -     Cognitive Function: Declined today.   MMSE - Mini Mental State Exam 12/15/2016  Orientation to time 1  Orientation to Place 5  Registration 3  Attention/  Calculation 0  Recall 1  Language- name 2 objects 2  Language- repeat 0  Language- follow 3 step  command 3  Language- read & follow direction 1  Write a sentence 0  Copy design 0  Total score 16        Immunization History  Administered Date(s) Administered  . Pneumococcal Conjugate-13 06/26/2014  . Pneumococcal Polysaccharide-23 01/17/2001, 07/19/2006, 02/13/2008  . Zoster 02/13/2008    Qualifies for Shingles Vaccine? Yes  Zostavax completed 02/13/08. Due for Shingrix. Education has been provided regarding the importance of this vaccine. Pt has been advised to call insurance company to determine out of pocket expense. Advised may also receive vaccine at local pharmacy or Health Dept. Verbalized acceptance and understanding.  Tdap: Although this vaccine is not a covered service during a Wellness Exam, does the patient still wish to receive this vaccine today?  No .   Advised may receive this vaccine at local pharmacy or Health Dept. Aware to provide a copy of the vaccination record if obtained from local pharmacy or Health Dept. Verbalized acceptance and understanding.  Flu Vaccine: Pt declines today.   Pneumococcal Vaccine: Up to date  Screening Tests Health Maintenance  Topic Date Due  . TETANUS/TDAP  01/15/1955  . DEXA SCAN  08/25/2017  . INFLUENZA VACCINE  03/09/2019  . PNA vac Low Risk Adult  Completed    Cancer Screenings:  Colorectal Screening: No longer required.   Mammogram: No longer required.   Bone Density: Completed 12/11/15. Results reflect OSTEOPOROSIS. Repeat every 2 years. Pt to speak with PCP about this further at in office visit.   Lung Cancer Screening: (Low Dose CT Chest recommended if Age 34-80 years, 30 pack-year currently smoking OR have quit w/in 15years.) does not qualify.   Additional Screening:  Vision Screening: Recommended annual ophthalmology exams for early detection of glaucoma and other disorders of the eye.  Dental Screening: Recommended annual dental exams for proper oral hygiene  Community Resource Referral:  CRR required  this visit?  No       Plan:  I have personally reviewed and addressed the Medicare Annual Wellness questionnaire and have noted the following in the patient's chart:  A. Medical and social history B. Use of alcohol, tobacco or illicit drugs  C. Current medications and supplements D. Functional ability and status E.  Nutritional status F.  Physical activity G. Advance directives H. List of other physicians I.  Hospitalizations, surgeries, and ER visits in previous 12 months J.  [place] such as hearing and vision if needed, cognitive and depression L. Referrals and appointments   In addition, I have reviewed and discussed with patient certain preventive protocols, quality metrics, and best practice recommendations. A written personalized care plan for preventive services as well as general preventive health recommendations were provided to patient. Nurse Health Advisor  Signed,    Lizandra Zakrzewski [place], [place]  0/0/1749 Nurse Health Advisor   Nurse Notes: Pt declined a DEXA referral and states that she would like to discuss this further with PCP at next in office visit.   Reviewed Nurse Heath Advisor's note and plan. Was available for consultation. Agree with documentation and recommendations.

## 2019-01-07 NOTE — Patient Instructions (Addendum)
Stephanie [name] , Thank you for taking time to come for your Medicare Wellness Visit. I appreciate your ongoing commitment to your health goals. Please review the following plan we discussed and let me know if I can assist you in the future.   Screening recommendations/referrals: Colonoscopy: Up to date Mammogram: No longer required.  Bone Density: Pt declines future order today.  Recommended yearly ophthalmology/optometry visit for glaucoma screening and checkup Recommended yearly dental visit for hygiene and checkup  Vaccinations: Influenza vaccine: Pt declines.  Pneumococcal vaccine: Completed series Tdap vaccine: Pt declines today.  Shingles vaccine: Pt declines today.     Advanced directives: Currently on file.   Conditions/risks identified: None.   Next appointment: 03/11/19 @ 10:40 AM with Vernie Murders.   Preventive Care 81 Years and Older, Female Preventive care refers to lifestyle choices and visits with your health care provider that can promote health and wellness. What does preventive care include?  A yearly physical exam. This is also called an annual well check.  Dental exams once or twice a year.  Routine eye exams. Ask your health care provider how often you should have your eyes checked.  Personal lifestyle choices, including:  Daily care of your teeth and gums.  Regular physical activity.  Eating a healthy diet.  Avoiding tobacco and drug use.  Limiting alcohol use.  Practicing safe sex.  Taking low-dose aspirin every day.  Taking vitamin and mineral supplements as recommended by your /health care provider. What happens during an annual well check? The services and screenings done by your health care provider during your annual well check will depend on your age, overall health, lifestyle risk factors, and family history of disease. Counseling  Your health care provider may ask you questions about your:  Alcohol use.  Tobacco use.  Drug  use.  Emotional well-being.  Home and relationship well-being.  Sexual activity.  Eating habits.  History of falls.  Memory and ability to understand (cognition).  Work and work Statistician.  Reproductive health. Screening  You may have the following tests or measurements:  Height, weight, and BMI.  Blood pressure.  Lipid and cholesterol levels. These may be checked every 5 years, or more frequently if you are over 36 years old.  Skin check.  Lung cancer screening. You may have this screening every year starting at age 58 if you have a 30-pack-year history of smoking and currently smoke or have quit within the past 15 years.  Fecal occult blood test (FOBT) of the stool. You may have this test every year starting at age 3.  Flexible sigmoidoscopy or colonoscopy. You may have a sigmoidoscopy every 5 years or a colonoscopy every 10 years starting at age 78.  Hepatitis C blood test.  Hepatitis B blood test.  Sexually transmitted disease (STD) testing.  Diabetes screening. This is done by checking your blood sugar (glucose) after you have not eaten for a while (fasting). You may have this done every 1-3 years.  Bone density scan. This is done to screen for osteoporosis. You may have this done starting at age 22.  Mammogram. This may be done every 1-2 years. Talk to your health care provider about how often you should have regular mammograms. Talk with your health care provider about your test results, treatment options, and if necessary, the need for more tests. Vaccines  Your health care provider may recommend certain vaccines, such as:  Influenza vaccine. This is recommended every year.  Tetanus, diphtheria, and acellular pertussis (  Tdap, Td) vaccine. You may need a Td booster every 10 years.  Zoster vaccine. You may need this after age 57.  Pneumococcal 13-valent conjugate (PCV13) vaccine. One dose is recommended after age 82.  Pneumococcal polysaccharide  (PPSV23) vaccine. One dose is recommended after age 36. Talk to your health care provider about which screenings and vaccines you need and how often you need them. This information is not intended to replace advice given to you by your health care provider. Make sure you discuss any questions you have with your health care provider. Document Released: 08/21/2015 Document Revised: 04/13/2016 Document Reviewed: 05/26/2015 Elsevier Interactive Patient Education  2017 [place] Prevention in the Home Falls can cause injuries. They can happen to people of all ages. There are many things you can do to make your home safe and to help prevent falls. What can I do on the outside of my home?  Regularly fix the edges of walkways and driveways and fix any cracks.  Remove anything that might make you trip as you walk through a door, such as a raised step or threshold.  Trim any bushes or trees on the path to your home.  Use bright outdoor lighting.  Clear any walking paths of anything that might make someone trip, such as rocks or tools.  Regularly check to see if handrails are loose or broken. Make sure that both sides of any steps have handrails.  Any raised decks and porches should have guardrails on the edges.  Have any leaves, snow, or ice cleared regularly.  Use sand or salt on walking paths during winter.  Clean up any spills in your garage right away. This includes oil or grease spills. What can I do in the bathroom?  Use night lights.  Install grab bars by the toilet and in the tub and shower. Do not use towel bars as grab bars.  Use non-skid mats or decals in the tub or shower.  If you need to sit down in the shower, use a plastic, non-slip stool.  Keep the floor dry. Clean up any water that spills on the floor as soon as it happens.  Remove soap buildup in the tub or shower regularly.  Attach bath mats securely with double-sided non-slip rug tape.  Do not have  throw rugs and other things on the floor that can make you trip. What can I do in the bedroom?  Use night lights.  Make sure that you have a light by your bed that is easy to reach.  Do not use any sheets or blankets that are too big for your bed. They should not hang down onto the floor.  Have a firm chair that has side arms. You can use this for support while you get dressed.  Do not have throw rugs and other things on the floor that can make you trip. What can I do in the kitchen?  Clean up any spills right away.  Avoid walking on wet floors.  Keep items that you use a lot in easy-to-reach places.  If you need to reach something above you, use a strong step stool that has a grab bar.  Keep electrical cords out of the way.  Do not use floor polish or wax that makes floors slippery. If you must use wax, use non-skid floor wax.  Do not have throw rugs and other things on the floor that can make you trip. What can I do with my stairs?  Do  not leave any items on the stairs.  Make sure that there are handrails on both sides of the stairs and use them. Fix handrails that are broken or loose. Make sure that handrails are as long as the stairways.  Check any carpeting to make sure that it is firmly attached to the stairs. Fix any carpet that is loose or worn.  Avoid having throw rugs at the top or bottom of the stairs. If you do have throw rugs, attach them to the floor with carpet tape.  Make sure that you have a light switch at the top of the stairs and the bottom of the stairs. If you do not have them, ask someone to add them for you. What else can I do to help prevent falls?  Wear shoes that:  Do not have high heels.  Have rubber bottoms.  Are comfortable and fit you well.  Are closed at the toe. Do not wear sandals.  If you use a stepladder:  Make sure that it is fully opened. Do not climb a closed stepladder.  Make sure that both sides of the stepladder are  locked into place.  Ask someone to hold it for you, if possible.  Clearly mark and make sure that you can see:  Any grab bars or handrails.  First and last steps.  Where the edge of each step is.  Use tools that help you move around (mobility aids) if they are needed. These include:  Canes.  Walkers.  Scooters.  Crutches.  Turn on the lights when you go into a dark area. Replace any light bulbs as soon as they burn out.  Set up your furniture so you have a clear path. Avoid moving your furniture around.  If any of your floors are uneven, fix them.  If there are any pets around you, be aware of where they are.  Review your medicines with your doctor. Some medicines can make you feel dizzy. This can increase your chance of falling. Ask your doctor what other things that you can do to help prevent falls. This information is not intended to replace advice given to you by your health care provider. Make sure you discuss any questions you have with your health care provider. Document Released: 05/21/2009 Document Revised: 12/31/2015 Document Reviewed: 08/29/2014 Elsevier Interactive Patient Education  2017 [employer].

## 2019-01-11 ENCOUNTER — Ambulatory Visit: Payer: PPO

## 2019-01-11 ENCOUNTER — Encounter: Payer: PPO | Admitting: Family Medicine

## 2019-01-18 ENCOUNTER — Other Ambulatory Visit: Payer: Self-pay | Admitting: Family Medicine

## 2019-01-18 DIAGNOSIS — K219 Gastro-esophageal reflux disease without esophagitis: Secondary | ICD-10-CM

## 2019-01-18 DIAGNOSIS — R05 Cough: Secondary | ICD-10-CM

## 2019-01-18 DIAGNOSIS — R058 Other specified cough: Secondary | ICD-10-CM

## 2019-01-22 ENCOUNTER — Ambulatory Visit (INDEPENDENT_AMBULATORY_CARE_PROVIDER_SITE_OTHER): Payer: PPO | Admitting: *Deleted

## 2019-01-22 DIAGNOSIS — I639 Cerebral infarction, unspecified: Secondary | ICD-10-CM | POA: Diagnosis not present

## 2019-01-23 LAB — CUP PACEART REMOTE DEVICE CHECK
Date Time Interrogation Session: 20200616213941
Implantable Pulse Generator Implant Date: 20180112

## 2019-02-01 NOTE — Progress Notes (Signed)
Carelink Summary Report / Loop Recorder 

## 2019-02-25 ENCOUNTER — Ambulatory Visit (INDEPENDENT_AMBULATORY_CARE_PROVIDER_SITE_OTHER): Payer: PPO | Admitting: *Deleted

## 2019-02-25 DIAGNOSIS — I639 Cerebral infarction, unspecified: Secondary | ICD-10-CM | POA: Diagnosis not present

## 2019-02-25 LAB — CUP PACEART REMOTE DEVICE CHECK
Date Time Interrogation Session: 20200719221420
Implantable Pulse Generator Implant Date: 20180112

## 2019-03-08 ENCOUNTER — Other Ambulatory Visit: Payer: Self-pay | Admitting: Family Medicine

## 2019-03-09 ENCOUNTER — Other Ambulatory Visit: Payer: Self-pay | Admitting: Family Medicine

## 2019-03-11 ENCOUNTER — Ambulatory Visit (INDEPENDENT_AMBULATORY_CARE_PROVIDER_SITE_OTHER): Payer: PPO | Admitting: Family Medicine

## 2019-03-11 ENCOUNTER — Other Ambulatory Visit: Payer: Self-pay

## 2019-03-11 VITALS — BP 142/64 | HR 84 | Temp 97.7°F | Ht 60.0 in | Wt 124.0 lb

## 2019-03-11 DIAGNOSIS — I10 Essential (primary) hypertension: Secondary | ICD-10-CM | POA: Diagnosis not present

## 2019-03-11 DIAGNOSIS — I63412 Cerebral infarction due to embolism of left middle cerebral artery: Secondary | ICD-10-CM | POA: Diagnosis not present

## 2019-03-11 DIAGNOSIS — N183 Chronic kidney disease, stage 3 unspecified: Secondary | ICD-10-CM

## 2019-03-11 DIAGNOSIS — J449 Chronic obstructive pulmonary disease, unspecified: Secondary | ICD-10-CM

## 2019-03-11 DIAGNOSIS — R4701 Aphasia: Secondary | ICD-10-CM

## 2019-03-11 DIAGNOSIS — E782 Mixed hyperlipidemia: Secondary | ICD-10-CM

## 2019-03-11 NOTE — Progress Notes (Addendum)
Patient: Stephanie [name], Female    DOB: [birth year], 83 y.o.   MRN: [id number] Visit Date: 03/11/2019  Today's Provider: Vernie Murders, PA   Chief Complaint  Patient presents with  . Annual Exam   Subjective:  Stephanie [name] is a 83 y.o. female who presents today for health maintenance and complete physical. She feels fairly well. She reports exercising none. She reports she is sleeping well.  08/26/15 BMD-Osteopenia  Review of Systems  Constitutional: Negative.   HENT: Negative.   Eyes: Negative.   Respiratory: Negative.   Cardiovascular: Negative.   Gastrointestinal: Negative.   Endocrine: Negative.   Genitourinary: Negative.   Musculoskeletal: Positive for arthralgias.  Skin: Negative.   Allergic/Immunologic: Negative.   Neurological: Positive for dizziness.  Hematological: Negative.   Psychiatric/Behavioral: Negative.    Social History   Socioeconomic History  . Marital status: Widowed    Spouse name: Not on file  . Number of children: 4  . Years of education: Not on file  . Highest education level: 12th grade  Occupational History  . Occupation: retired  Scientific laboratory technician  . Financial resource strain: Not hard at all  . Food insecurity    Worry: Never true    Inability: Never true  . Transportation needs    Medical: No    Non-medical: No  Tobacco Use  . Smoking status: Former Smoker    Quit date: 08/07/2001    Years since quitting: 17.6  . Smokeless tobacco: Never Used  . Tobacco comment: QUIT IN 2002  Substance and Sexual Activity  . Alcohol use: No    Alcohol/week: 0.0 standard drinks  . Drug use: No  . Sexual activity: Not on file  Lifestyle  . Physical activity    Days per week: 0 days    Minutes per session: 0 min  . Stress: Not at all  Relationships  . Social Herbalist on phone: Patient refused    Gets together: Patient refused    Attends religious service: Patient refused    Active member of club or organization: Patient  refused    Attends meetings of clubs or organizations: Patient refused    Relationship status: Patient refused  . Intimate partner violence    Fear of current or ex partner: Patient refused    Emotionally abused: Patient refused    Physically abused: Patient refused    Forced sexual activity: Patient refused  Other Topics Concern  . Not on file  Social History Narrative   Lives alone   Patient Active Problem List   Diagnosis Date Noted  . CAP (community acquired pneumonia) 09/15/2017  . Sepsis ([place]) 09/12/2017  . History of stroke 09/06/2017  . Essential hypertension 04/14/2017  . PFO (patent foramen ovale)   . DVT (deep venous thrombosis) ([place])   . Cerebrovascular accident (CVA) due to embolism of left middle cerebral artery ([place])   . Expressive aphasia   . Elevated troponin 08/18/2016  . Mild malnutrition ([place]) 08/18/2016  . Acute CVA (cerebrovascular accident) ([place]) 08/17/2016  . Acute exacerbation of chronic obstructive pulmonary disease (COPD) ([place]) 07/19/2016  . Depression 05/03/2016  . Mild dementia ([place]) 04/19/2016  . COPD, moderate ([place]) 03/05/2015  . Anxiety and depression 03/04/2015  . Arteriosclerosis of coronary artery 03/04/2015  . CAFL (chronic airflow limitation) ([place]) 03/04/2015  . Breathlessness on exertion 03/04/2015  . HLD (hyperlipidemia) 03/04/2015  . Osteopenia 03/04/2015  . Peptic ulcer 03/04/2015  . Amnesia 03/20/2014  . Disordered sleep 03/20/2014  .  Allergy to environmental factors 11/14/2013  . Gastroduodenal ulcer 11/14/2013  . GI bleed 11/14/2013   Past Surgical History:  Procedure Laterality Date  . BALLOON ANGIOPLASTY, ARTERY  05/17/2009  . CATARACT EXTRACTION W/PHACO Left 05/24/2016   Procedure: CATARACT EXTRACTION PHACO AND INTRAOCULAR LENS PLACEMENT (IOC);  Surgeon: Birder Robson, MD;  Location: [facility];  Service: Ophthalmology;  Laterality: Left;  Lot# [id number] H [place]:   00:52.3 AP%:   27.3 CDE:  14.26   . CATARACT EXTRACTION  W/PHACO Right 06/14/2016   Procedure: CATARACT EXTRACTION PHACO AND INTRAOCULAR LENS PLACEMENT (IOC);  Surgeon: Birder Robson, MD;  Location: [facility];  Service: Ophthalmology;  Laterality: Right;  Lot# [id number] H [place]: 01:13.4 AP%: 24.4 CDE: 17.85  . CORONARY ANGIOPLASTY     STENT  . EP IMPLANTABLE DEVICE N/A 08/19/2016   Procedure: Loop Recorder Insertion;  Surgeon: Will Meredith Leeds, MD;  Location: [facility];  Service: Cardiovascular;  Laterality: N/A;   Her family history includes Alcohol abuse in her father; Cancer in her mother; Diabetes in her mother; Heart disease in her sister; Hypertension in her father, mother, and sister; Kidney disease in her sister; Pancreatic cancer (age of onset: 57) in her mother; Parkinson's disease in her sister; Suicidality (age of onset: 37) in her father; Varicose Veins in her sister.     Allergies  Allergen Reactions  . Duloxetine Hcl     unknown  . Influenza Vaccines     unknown  . Venlafaxine     GI distress    Outpatient Encounter Medications as of 03/11/2019  Medication Sig  . acetaminophen (TYLENOL) 500 MG tablet Take 1,000 mg by mouth 2 (two) times daily.   Marland Kitchen amLODipine (NORVASC) 5 MG tablet TAKE ONE TABLET BY MOUTH EVERY DAY  . Calcium Carb-Cholecalciferol (CALCIUM 600+D) 600-800 MG-UNIT TABS Take 1 tablet by mouth daily.   . cetirizine (ZYRTEC) 10 MG tablet Take 10 mg by mouth daily.  Marland Kitchen docusate sodium (COLACE) 250 MG capsule Take 100 mg by mouth at bedtime.   Marland Kitchen ELIQUIS 5 MG TABS tablet TAKE 1 TABLET BY MOUTH TWICE A DAY  . gabapentin (NEURONTIN) 100 MG capsule TAKE 1 CAPSULE (100MG ) BY MOUTH TWICE DAILY  . Multiple Vitamin (MULTIVITAMIN) tablet Take 1 tablet by mouth daily.  Marland Kitchen omeprazole (PRILOSEC) 20 MG capsule TAKE 1 CAPSULE BY MOUTH EVERY DAY  . PARoxetine (PAXIL) 10 MG tablet TAKE 1 TABLET (10MG ) IN THE MORNING AND 2 TABLETS (20MG ) AT BEDTIME  . PROAIR HFA 108 (90 Base) MCG/ACT inhaler INHALE TWO PUFFS INTO LUNGS EVERY 6  HOURS AS NEEDED  . Spacer/Aero Chamber Mouthpiece MISC Use spacer on inhalers for better administration.  . SYMBICORT 160-4.5 MCG/ACT inhaler INHALE TWO PUFFS INTO THE LUNGS TWICE A DAY  . tiotropium (SPIRIVA HANDIHALER) 18 MCG inhalation capsule INHALE CONTENTS OF ONE CAPSULE ONCE A DAY AS DIRECTED  . triamcinolone (KENALOG) 0.025 % cream w/ Cerave - APPLY 1 APPLICATION(S) TOPICAL DAILY FROM THE NECK DOWN  . [DISCONTINUED] PARoxetine (PAXIL) 10 MG tablet TAKE 1 TABLET (10MG ) IN THE MORNING AND 2 TABLETS (20MG ) AT BEDTIME  . [DISCONTINUED] SYMBICORT 160-4.5 MCG/ACT inhaler INHALE TWO PUFFS INTO THE LUNGS TWICE A DAY   No facility-administered encounter medications on file as of 03/11/2019.    Patient Care Team: Chrismon, Vickki Muff, PA as PCP - General (Physician Assistant) Minna Merritts, MD as PCP - Cardiology (Cardiology) Erby Pian, MD as Referring Physician (Specialist) Jannet Mantis, MD as Consulting Physician (Dermatology)  Objective:   Vitals:  Vitals:   03/11/19 1101  BP: (!) 142/64  Pulse: 84  Temp: 97.7 F (36.5 C)  TempSrc: Oral  SpO2: 97%  Weight: 124 lb (56.2 kg)  Height: 5' (1.524 m)   BP Readings from Last 3 Encounters:  03/11/19 (!) 142/64  08/30/18 133/71  05/21/18 140/70   Wt Readings from Last 3 Encounters:  03/11/19 124 lb (56.2 kg)  08/30/18 127 lb 12.8 oz (58 kg)  05/21/18 128 lb 9.6 oz (58.3 kg)   Physical Exam Constitutional:      Appearance: She is well-developed.  HENT:     Head: Normocephalic and atraumatic.     Right Ear: External ear normal.     Left Ear: External ear normal.     Nose: Nose normal.  Eyes:     General:        Right eye: No discharge.     Conjunctiva/sclera: Conjunctivae normal.     Pupils: Pupils are equal, round, and reactive to light.  Neck:     Musculoskeletal: Normal range of motion and neck supple.     Thyroid: No thyromegaly.     Trachea: No tracheal deviation.  Cardiovascular:     Rate  and Rhythm: Normal rate and regular rhythm.     Heart sounds: Normal heart sounds. No murmur.  Pulmonary:     Effort: Pulmonary effort is normal. No respiratory distress.     Breath sounds: Normal breath sounds. No wheezing or rales.  Chest:     Chest wall: No tenderness.  Abdominal:     General: There is no distension.     Palpations: Abdomen is soft. There is no mass.     Tenderness: There is no abdominal tenderness. There is no guarding or rebound.  Musculoskeletal: Normal range of motion.        General: No tenderness.     Comments: Crepitus in the right knee. No peripheral edema and good pulses. DTR's symmetric UE's and LE's.   Lymphadenopathy:     Cervical: No cervical adenopathy.  Skin:    General: Skin is warm and dry.     Findings: No erythema or rash.  Neurological:     Mental Status: She is alert and oriented to person, place, and time.     Cranial Nerves: No cranial nerve deficit.     Motor: No abnormal muscle tone.     Coordination: Coordination normal.     Deep Tendon Reflexes: Reflexes are normal and symmetric. Reflexes normal.     Comments: Expressive aphasia secondary to CVA.   Psychiatric:        Behavior: Behavior normal.        Thought Content: Thought content normal.        Judgment: Judgment normal.    Depression Screen PHQ 2/9 Scores 01/07/2019 05/21/2018 01/05/2018 01/05/2018  PHQ - 2 Score 0 0 0 0  PHQ- 9 Score - - 0 -    Assessment & Plan:     Routine Health Maintenance and Physical Exam  Exercise Activities and Dietary recommendations Goals    . LIFESTYLE - DECREASE FALLS RISK     Recommended ways to prep house inside and out to prevent falls.        Immunization History  Administered Date(s) Administered  . Pneumococcal Conjugate-13 06/26/2014  . Pneumococcal Polysaccharide-23 01/17/2001, 07/19/2006, 02/13/2008  . Zoster 02/13/2008    Health Maintenance  Topic Date Due  . TETANUS/TDAP  01/15/1957  . DEXA SCAN  08/25/2017  .  INFLUENZA VACCINE  03/09/2019  . PNA vac Low Risk Adult  Completed    Discussed health benefits of physical activity, and encouraged her to engage in regular exercise appropriate for her age and condition.   1. Expressive aphasia              Unchanged bizarre sentence construction secondary                to expressive aphasia from CVA in 2018. Stable and                 no unilateral weakness or swallowing difficulties. Still                 on the Eliquis 5 mg BID and continue follow up with                   Dr. Leonie Man (neurologist) annually or prn. - CBC with Differential/Platelet - Comprehensive metabolic panel - TSH  2. Cerebrovascular accident (CVA) due to embolism of left middle cerebral artery ([place]) Ambulating well and seems to be in good spirits. History of CVA in 2018 with residual expressive aphasia. No dysphagia or muscle weakness. - CBC with Differential/Platelet - Comprehensive metabolic panel - TSH - Lipid panel  3. Essential hypertension Stable control on Amlodipine 5 mg qd. No chest pains, palpitations or dyspnea. Recheck routine labs. - CBC with Differential/Platelet - Comprehensive metabolic panel - TSH - Lipid panel  4. Mixed hyperlipidemia Trying to control levels on low fat diet and increase in activities. Will recheck labs to see if she needs to restart statin. - Comprehensive metabolic panel - TSH - Lipid panel  5. COPD, moderate ([address]) Followed by Dr. Raul Del (pulmonologist) and continues Proair-HFA prn wheeze, Symbicort 160-4.5 mcg/ACT 2 puffs BID and Spiriva 18 mcg daily. Breathing stable today and lungs clear. Recheck routine labs. - CBC with Differential/Platelet  6. CKD (chronic kidney disease) stage 3, GFR 30-59 ml/min (HCC) Follow up labs on 08-27-18 showed creatinine 1.45 with GFR 34 - recheck CBC and CMP to assess progress.  - CBC with Differential/Platelet - Comprehensive metabolic panel

## 2019-03-11 NOTE — Progress Notes (Signed)
Carelink Summary Report / Loop Recorder 

## 2019-03-12 ENCOUNTER — Telehealth: Payer: Self-pay

## 2019-03-12 LAB — CBC WITH DIFFERENTIAL/PLATELET
Basophils Absolute: 0.1 10*3/uL (ref 0.0–0.2)
Basos: 1 %
EOS (ABSOLUTE): 0.2 10*3/uL (ref 0.0–0.4)
Eos: 2 %
Hematocrit: 31.2 % — ABNORMAL LOW (ref 34.0–46.6)
Hemoglobin: 10.3 g/dL — ABNORMAL LOW (ref 11.1–15.9)
Immature Grans (Abs): 0 10*3/uL (ref 0.0–0.1)
Immature Granulocytes: 0 %
Lymphocytes Absolute: 1.2 10*3/uL (ref 0.7–3.1)
Lymphs: 15 %
MCH: 30.7 pg (ref 26.6–33.0)
MCHC: 33 g/dL (ref 31.5–35.7)
MCV: 93 fL (ref 79–97)
Monocytes Absolute: 0.6 10*3/uL (ref 0.1–0.9)
Monocytes: 7 %
Neutrophils Absolute: 6 10*3/uL (ref 1.4–7.0)
Neutrophils: 75 %
Platelets: 282 10*3/uL (ref 150–450)
RBC: 3.36 x10E6/uL — ABNORMAL LOW (ref 3.77–5.28)
RDW: 12 % (ref 11.7–15.4)
WBC: 8 10*3/uL (ref 3.4–10.8)

## 2019-03-12 LAB — LIPID PANEL
Chol/HDL Ratio: 2.2 ratio (ref 0.0–4.4)
Cholesterol, Total: 244 mg/dL — ABNORMAL HIGH (ref 100–199)
HDL: 109 mg/dL (ref >39)
LDL Calculated: 116 mg/dL — ABNORMAL HIGH (ref 0–99)
Triglycerides: 97 mg/dL (ref 0–149)
VLDL Cholesterol Cal: 19 mg/dL (ref 5–40)

## 2019-03-12 LAB — COMPREHENSIVE METABOLIC PANEL
ALT: 14 IU/L (ref 0–32)
AST: 19 IU/L (ref 0–40)
Albumin/Globulin Ratio: 2 (ref 1.2–2.2)
Albumin: 4.4 g/dL (ref 3.6–4.6)
Alkaline Phosphatase: 97 IU/L (ref 39–117)
BUN/Creatinine Ratio: 21 (ref 12–28)
BUN: 33 mg/dL — ABNORMAL HIGH (ref 8–27)
Bilirubin Total: 0.2 mg/dL (ref 0.0–1.2)
CO2: 23 mmol/L (ref 20–29)
Calcium: 9.9 mg/dL (ref 8.7–10.3)
Chloride: 103 mmol/L (ref 96–106)
Creatinine, Ser: 1.6 mg/dL — ABNORMAL HIGH (ref 0.57–1.00)
GFR calc Af Amer: 35 mL/min/{1.73_m2} — ABNORMAL LOW (ref >59)
GFR calc non Af Amer: 30 mL/min/{1.73_m2} — ABNORMAL LOW (ref >59)
Globulin, Total: 2.2 g/dL (ref 1.5–4.5)
Glucose: 97 mg/dL (ref 65–99)
Potassium: 5.5 mmol/L — ABNORMAL HIGH (ref 3.5–5.2)
Sodium: 140 mmol/L (ref 134–144)
Total Protein: 6.6 g/dL (ref 6.0–8.5)

## 2019-03-12 LAB — TSH: TSH: 2.91 u[IU]/mL (ref 0.450–4.500)

## 2019-03-12 NOTE — Telephone Encounter (Signed)
Please review

## 2019-03-12 NOTE — Telephone Encounter (Signed)
Some of the Crystal Light flavored powders to put in a bottle of water can give it a different taste. The extra water helps to flush out kidneys and improve function.

## 2019-03-12 NOTE — Telephone Encounter (Signed)
-----   Message from Margo Common, [place] sent at 03/12/2019  9:20 AM EDT ----- Cholesterol risk ratio is very good, although LDL level is above goal of <100. Blood counts stable but hgb still a little low and should take a multivitamin with iron daily. Kidney function still shows extra strain. Should drink 4 glasses of water a day and recheck levels in 3-4 months.

## 2019-03-12 NOTE — Telephone Encounter (Signed)
Patient daughter was advised, she wanted to know if patients liver function test was normal was it okay to start patient on Tylenol Arthritis? Daughter also states that she has been encouraging patient to drink water and states that patient claims to be drinking she wanted to know if there is anything else patient can drink that wont cause strain on her kidneys? KW

## 2019-03-14 NOTE — Telephone Encounter (Signed)
Advised 

## 2019-03-29 ENCOUNTER — Ambulatory Visit (INDEPENDENT_AMBULATORY_CARE_PROVIDER_SITE_OTHER): Payer: PPO | Admitting: *Deleted

## 2019-03-29 DIAGNOSIS — I639 Cerebral infarction, unspecified: Secondary | ICD-10-CM | POA: Diagnosis not present

## 2019-03-31 LAB — CUP PACEART REMOTE DEVICE CHECK
Date Time Interrogation Session: 20200821221126
Implantable Pulse Generator Implant Date: 20180112

## 2019-04-05 NOTE — Progress Notes (Signed)
Carelink Summary Report / Loop Recorder 

## 2019-04-10 DIAGNOSIS — J449 Chronic obstructive pulmonary disease, unspecified: Secondary | ICD-10-CM | POA: Diagnosis not present

## 2019-04-10 DIAGNOSIS — R0600 Dyspnea, unspecified: Secondary | ICD-10-CM | POA: Diagnosis not present

## 2019-04-18 ENCOUNTER — Other Ambulatory Visit: Payer: Self-pay | Admitting: Family Medicine

## 2019-05-01 ENCOUNTER — Ambulatory Visit (INDEPENDENT_AMBULATORY_CARE_PROVIDER_SITE_OTHER): Payer: PPO | Admitting: *Deleted

## 2019-05-01 DIAGNOSIS — I639 Cerebral infarction, unspecified: Secondary | ICD-10-CM | POA: Diagnosis not present

## 2019-05-02 LAB — CUP PACEART REMOTE DEVICE CHECK
Date Time Interrogation Session: 20200923221040
Implantable Pulse Generator Implant Date: 20180112

## 2019-05-07 NOTE — Progress Notes (Signed)
Carelink Summary Report / Loop Recorder 

## 2019-06-03 ENCOUNTER — Ambulatory Visit (INDEPENDENT_AMBULATORY_CARE_PROVIDER_SITE_OTHER): Payer: PPO | Admitting: *Deleted

## 2019-06-03 DIAGNOSIS — I639 Cerebral infarction, unspecified: Secondary | ICD-10-CM

## 2019-06-04 ENCOUNTER — Other Ambulatory Visit: Payer: Self-pay | Admitting: Family Medicine

## 2019-06-04 DIAGNOSIS — I63412 Cerebral infarction due to embolism of left middle cerebral artery: Secondary | ICD-10-CM

## 2019-06-04 DIAGNOSIS — R4701 Aphasia: Secondary | ICD-10-CM

## 2019-06-04 LAB — CUP PACEART REMOTE DEVICE CHECK
Date Time Interrogation Session: 20201026220959
Implantable Pulse Generator Implant Date: 20180112

## 2019-06-05 ENCOUNTER — Other Ambulatory Visit: Payer: Self-pay | Admitting: Family Medicine

## 2019-06-07 ENCOUNTER — Other Ambulatory Visit: Payer: Self-pay | Admitting: Family Medicine

## 2019-06-07 ENCOUNTER — Telehealth: Payer: Self-pay | Admitting: Family Medicine

## 2019-06-07 NOTE — Telephone Encounter (Signed)
Pharmacy/Insurance is needing an approval from [place] to fill the tiotropium (SPIRIVA HANDIHALER) 18 MCG inhalation capsule.  Please advise so the Rx can be picked up.  Thanks, [employer]

## 2019-06-20 NOTE — Progress Notes (Signed)
Carelink Summary Report / Loop Recorder 

## 2019-07-07 LAB — CUP PACEART REMOTE DEVICE CHECK
Date Time Interrogation Session: 20201128174240
Implantable Pulse Generator Implant Date: 20180112

## 2019-07-08 ENCOUNTER — Ambulatory Visit (INDEPENDENT_AMBULATORY_CARE_PROVIDER_SITE_OTHER): Payer: PPO | Admitting: *Deleted

## 2019-07-08 DIAGNOSIS — I639 Cerebral infarction, unspecified: Secondary | ICD-10-CM | POA: Diagnosis not present

## 2019-07-27 ENCOUNTER — Other Ambulatory Visit: Payer: Self-pay | Admitting: Family Medicine

## 2019-07-27 DIAGNOSIS — R05 Cough: Secondary | ICD-10-CM

## 2019-07-27 DIAGNOSIS — K219 Gastro-esophageal reflux disease without esophagitis: Secondary | ICD-10-CM

## 2019-07-27 DIAGNOSIS — R058 Other specified cough: Secondary | ICD-10-CM

## 2019-07-31 NOTE — Progress Notes (Signed)
ILR remote 

## 2019-08-01 IMAGING — CT CT HEAD W/O CM
3 series · 15 of 46 positions shown, 18 images · non-contrast
Comparison: 03/28/2011 CT head.

CLINICAL DATA: 81 y/o F; unsteady gait, jumbled speech, shortness
of breath. History of CVA.

EXAM:
CT HEAD WITHOUT CONTRAST
TECHNIQUE: Contiguous axial images were obtained from the base of the skull
through the vertex without intravenous contrast.

[Series 2: head wo · axial · 0.41mm/px · z∈[-137,-17]mm · 9 of 29 slices shown, 12 images]
[im 3/29  brain]
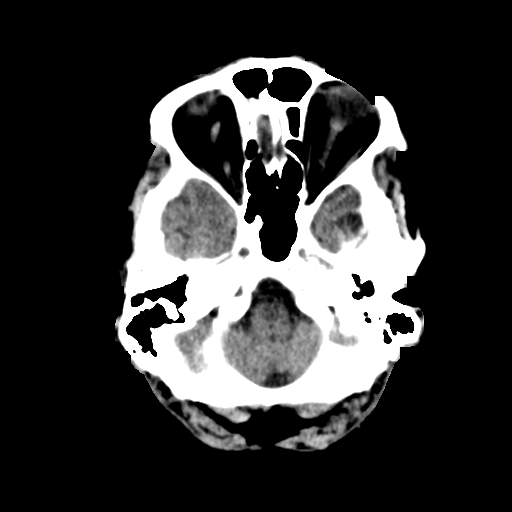
[im 3/29  bone]
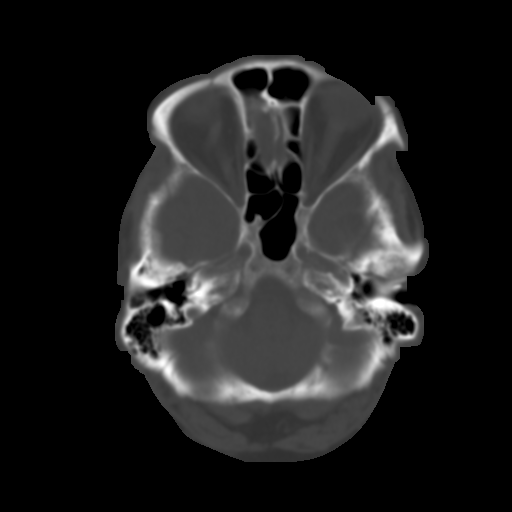
[im 6/29  brain]
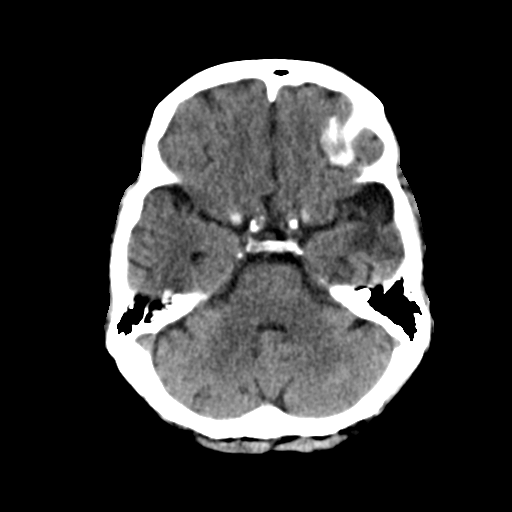
[im 9/29  brain]
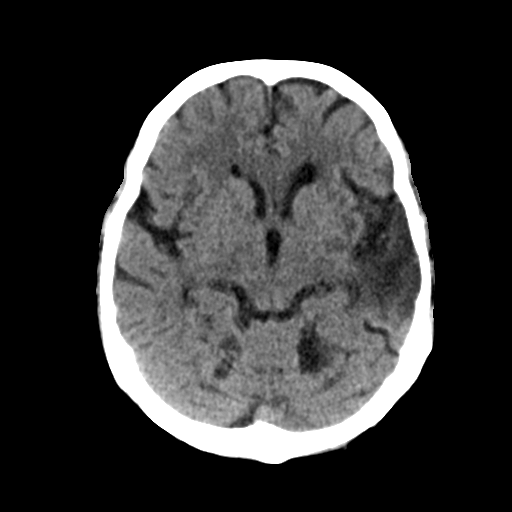
[im 12/29  brain]
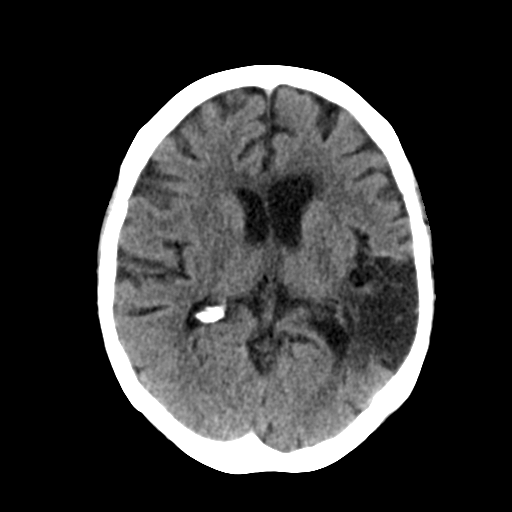
[im 15/29  brain]
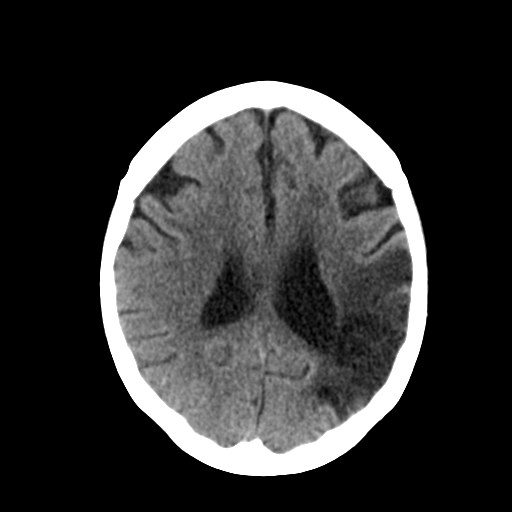
[im 15/29  bone]
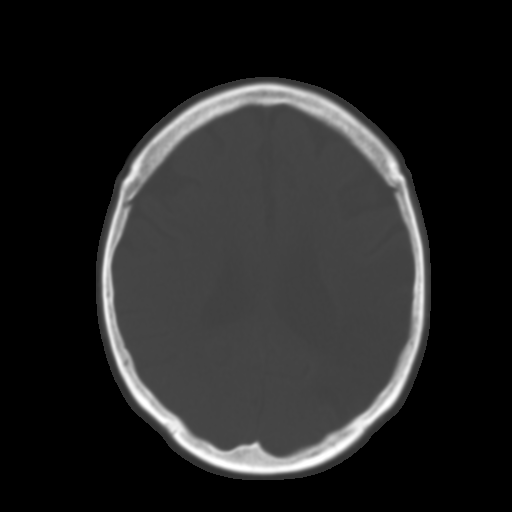
[im 18/29  brain]
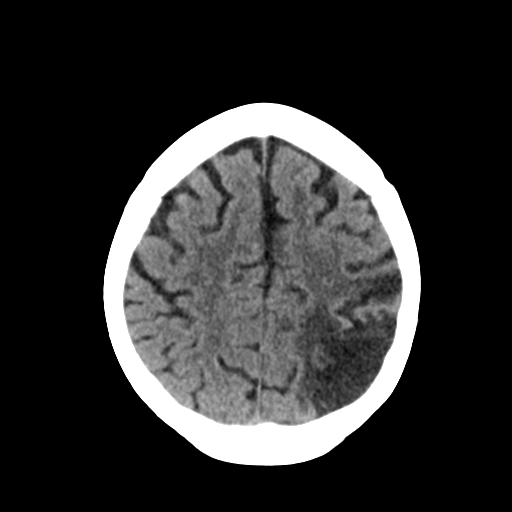
[im 21/29  brain]
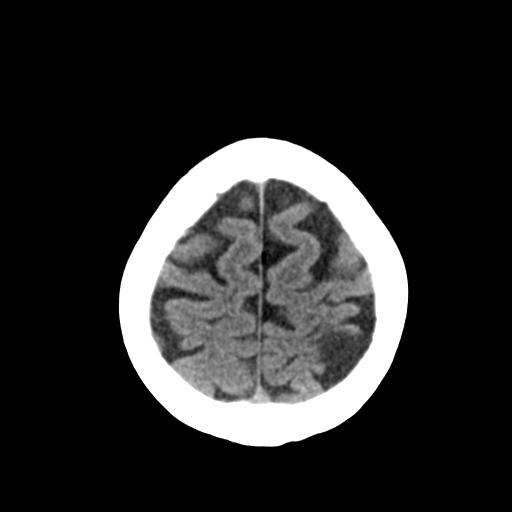
[im 24/29  brain]
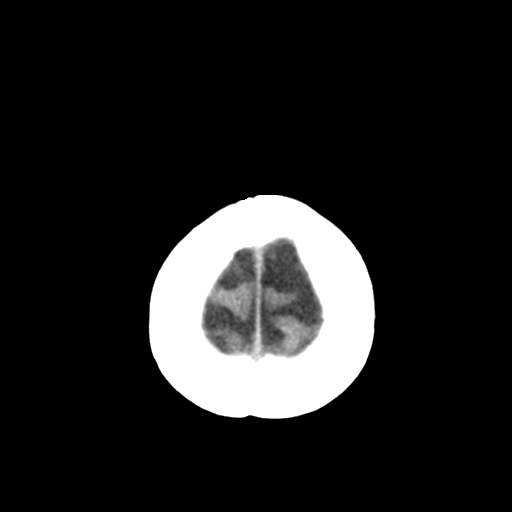
[im 27/29  brain]
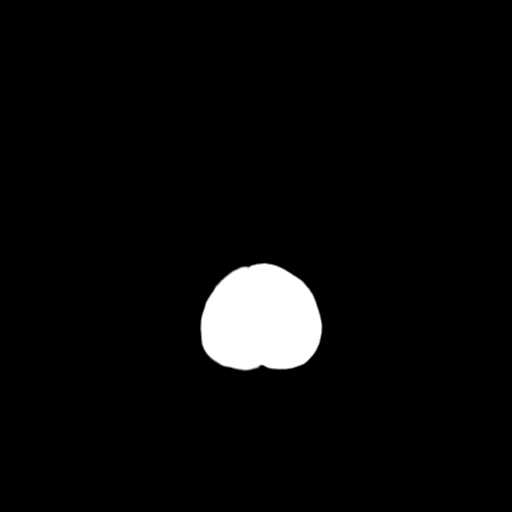
[im 27/29  bone]
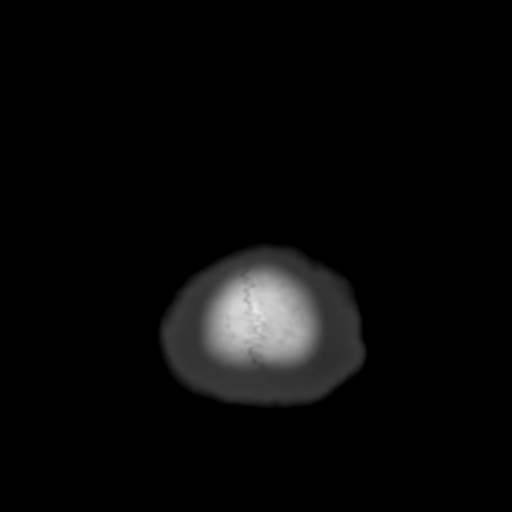

[Series 4: coronal soft tissue · coronal · 0.31mm/px · 3 of 62 slices shown]
[im 21/62  brain]
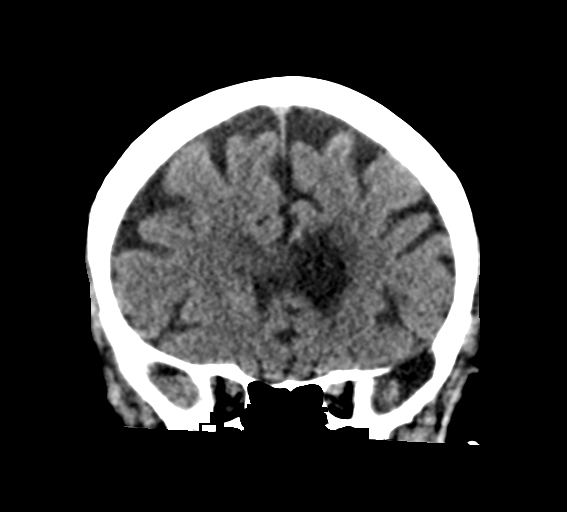
[im 28/62  brain]
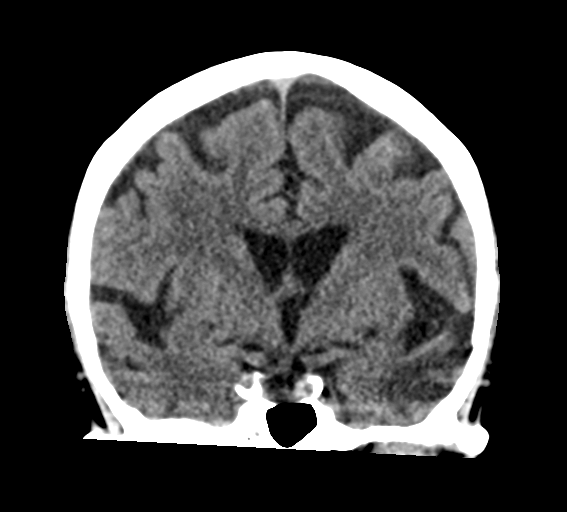
[im 34/62  brain]
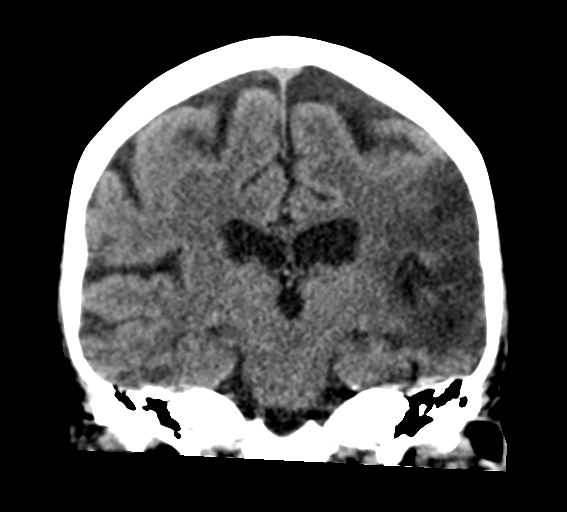

[Series 5: sagittal soft tissue · sagittal · 0.32mm/px · 3 of 49 slices shown]
[im 17/49  brain]
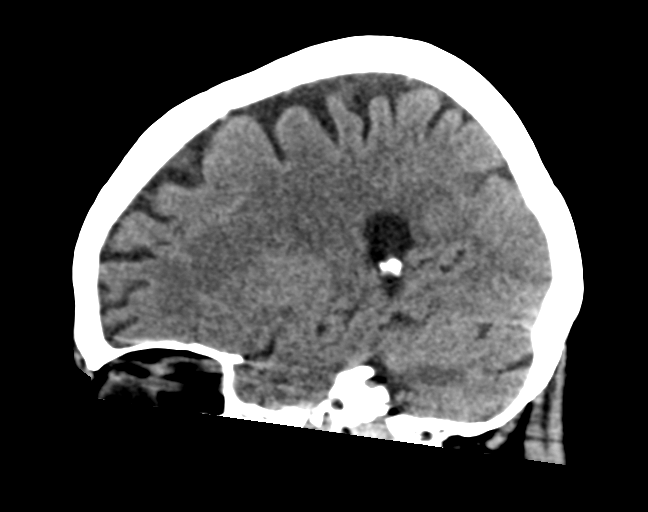
[im 25/49  brain]
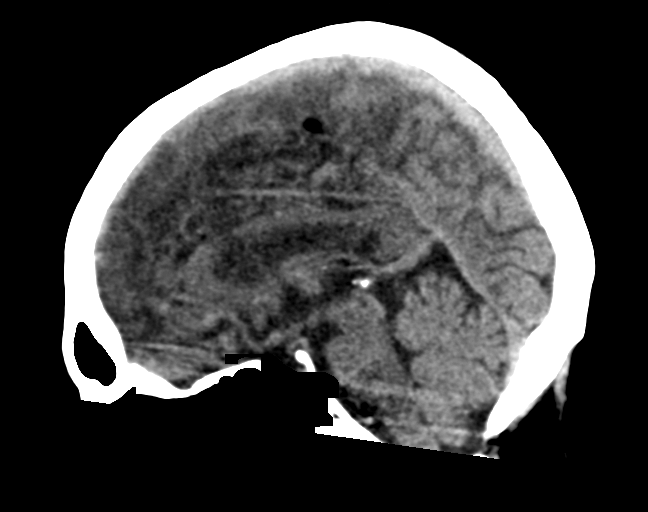
[im 33/49  brain]
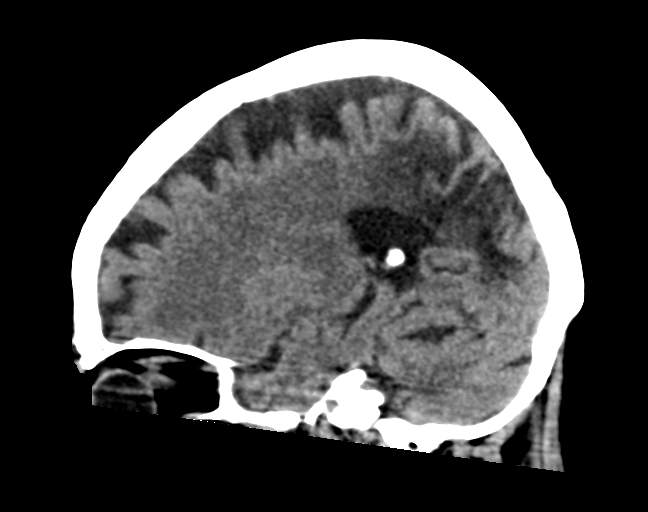

[15 of 46 positions shown; findings below may reference images not displayed]

FINDINGS: Brain: No evidence of acute infarction, hemorrhage, hydrocephalus,
extra-axial collection or mass lesion/mass effect. Stable chronic
infarcts in the bilateral superior cerebellar hemispheres and left
genu of the corpus callosum. Stable chronic microvascular ischemic
changes and parenchymal volume loss of the brain. Interval large
chronic left posterior MCA distribution infarction. Ex vacuo
dilatation of left lateral ventricle.

Vascular: Calcific atherosclerosis of carotid siphons. No hyperdense
vessel.

Skull: Normal. Negative for fracture or focal lesion.

Sinuses/Orbits: No acute finding.

Other: None.
IMPRESSION: 1. No acute intracranial abnormality identified.
2. Interval large left posterior MCA distribution chronic
infarction.
3. Stable chronic infarctions of cerebellum and left genu of corpus
callosum, chronic microvascular ischemic changes, and parenchymal
volume loss of the brain.

By: Nur Ainaezza Jafni M.D.

## 2019-08-08 ENCOUNTER — Ambulatory Visit (INDEPENDENT_AMBULATORY_CARE_PROVIDER_SITE_OTHER): Payer: PPO | Admitting: *Deleted

## 2019-08-08 DIAGNOSIS — I639 Cerebral infarction, unspecified: Secondary | ICD-10-CM

## 2019-08-09 LAB — CUP PACEART REMOTE DEVICE CHECK
Date Time Interrogation Session: 20201231203407
Implantable Pulse Generator Implant Date: 20180112

## 2019-08-26 ENCOUNTER — Telehealth: Payer: Self-pay | Admitting: Family Medicine

## 2019-08-26 NOTE — Telephone Encounter (Signed)
Yes, she should get it as soon she is able to sign up for it.

## 2019-08-26 NOTE — Telephone Encounter (Signed)
Copied from [place] [id number]. Topic: General - Other >> Aug 26, 2019  1:23 PM Keene Breath wrote: Reason for CRM: Patient's daughter, Beverlee Nims, is calling to see if it would be ok for patient to take the vaccine.  CB# [phone]

## 2019-08-26 NOTE — Telephone Encounter (Signed)
Called and LVM on patient's daughter's phone that it is ok for patient to get the vaccine.

## 2019-08-29 ENCOUNTER — Other Ambulatory Visit: Payer: Self-pay | Admitting: Family Medicine

## 2019-09-02 ENCOUNTER — Other Ambulatory Visit: Payer: Self-pay | Admitting: Family Medicine

## 2019-09-02 MED ORDER — SPIRIVA HANDIHALER 18 MCG IN CAPS: 1.0000 | ORAL_CAPSULE | Freq: Every day | RESPIRATORY_TRACT | 12 refills | Status: DC

## 2019-09-02 MED ORDER — PAROXETINE HCL 10 MG PO TABS: ORAL_TABLET | ORAL | 1 refills | Status: DC

## 2019-09-02 MED ORDER — ALBUTEROL SULFATE HFA 108 (90 BASE) MCG/ACT IN AERS: INHALATION_SPRAY | RESPIRATORY_TRACT | 4 refills | Status: AC

## 2019-09-02 NOTE — Telephone Encounter (Signed)
Requested Prescriptions  Pending Prescriptions Disp Refills  . tiotropium (SPIRIVA HANDIHALER) 18 MCG inhalation capsule 30 capsule 4     Pulmonology:  Anticholinergic Agents Passed - 09/02/2019 10:06 AM      Passed - Valid encounter within last 12 months    Recent Outpatient Visits          5 months ago Expressive aphasia   [facility], Vickki Muff, [place]   1 year ago Expressive aphasia   [facility], Vickki Muff, [place]   1 year ago Recurrent dry cough   [facility], Vickki Muff, [place]   1 year ago Annual physical exam   [place], [place]   1 year ago Papular urticaria   [facility] [place], [place]             . albuterol (PROAIR HFA) [phone] Base) MCG/ACT inhaler       Pulmonology:  Beta Agonists Failed - 09/02/2019 10:06 AM      Failed - One inhaler should last at least one month. If the patient is requesting refills earlier, contact the patient to check for uncontrolled symptoms.      Passed - Valid encounter within last 12 months    Recent Outpatient Visits          5 months ago Expressive aphasia   [facility], Vickki Muff, [place]   1 year ago Expressive aphasia   [place], [place]   1 year ago Recurrent dry cough   [facility], Vickki Muff, [place]   1 year ago Annual physical exam   [place], [place]   1 year ago Papular urticaria   [place], [place]             . PARoxetine (PAXIL) 10 MG tablet 270 tablet 1    Sig: TAKE 1 TABLET (10MG ) IN THE MORNING AND 2 TABLETS (20MG ) AT BEDTIME     Psychiatry:  Antidepressants - SSRI Passed - 09/02/2019 10:06 AM      Passed - Completed PHQ-2 or PHQ-9 in the last 360 days.      Passed - Valid encounter within last 6 months    Recent Outpatient Visits          5 months ago Expressive  aphasia   [facility], Vickki Muff, [place]   1 year ago Expressive aphasia   [place], [place]   1 year ago Recurrent dry cough   [facility], Vickki Muff, [place]   1 year ago Annual physical exam   [place], [place]   1 year ago Papular urticaria   [place], [place]

## 2019-09-02 NOTE — Telephone Encounter (Signed)
Requested medication (s) are due for refill today: Spiriva, yes  Requested medication (s) are on the active medication list: yes  Last refill: 06/08/2019  Future visit scheduled: yes  Notes to clinic:  see pharmacy note  Requested medication (s) are due for refill today: Albuterol, yes  Requested medication (s) are on the active medication list: yes  Last refill:  06/05/2019  Future visit scheduled: yes  Notes to clinic: see pharmacy note  Requested Prescriptions  Pending Prescriptions Disp Refills   tiotropium (SPIRIVA HANDIHALER) 18 MCG inhalation capsule 30 capsule 4      Pulmonology:  Anticholinergic Agents Passed - 09/02/2019 10:06 AM      Passed - Valid encounter within last 12 months    Recent Outpatient Visits           5 months ago Expressive aphasia   [facility], Vickki Muff, [place]   1 year ago Expressive aphasia   [facility], [place], [place]   1 year ago Recurrent dry cough   [facility], Vickki Muff, [place]   1 year ago Annual physical exam   [place], [place]   1 year ago Papular urticaria   [place], [place]                albuterol (PROAIR HFA) [phone] Base) MCG/ACT inhaler        Pulmonology:  Beta Agonists Failed - 09/02/2019 10:06 AM      Failed - One inhaler should last at least one month. If the patient is requesting refills earlier, contact the patient to check for uncontrolled symptoms.      Passed - Valid encounter within last 12 months    Recent Outpatient Visits           5 months ago Expressive aphasia   [facility], Vickki Muff, [place]   1 year ago Expressive aphasia   [facility], Vickki Muff, [place]   1 year ago Recurrent dry cough   [facility], Vickki Muff, [place]   1 year ago Annual physical exam   [place], [place]   1 year  ago Papular urticaria   [place], [place]               Signed Prescriptions Disp Refills   PARoxetine (PAXIL) 10 MG tablet 270 tablet 1    Sig: TAKE 1 TABLET (10MG ) IN THE MORNING AND 2 TABLETS (20MG ) AT BEDTIME      Psychiatry:  Antidepressants - SSRI Passed - 09/02/2019 10:06 AM      Passed - Completed PHQ-2 or PHQ-9 in the last 360 days.      Passed - Valid encounter within last 6 months    Recent Outpatient Visits           5 months ago Expressive aphasia   [facility], Vickki Muff, [place]   1 year ago Expressive aphasia   [place], [place]   1 year ago Recurrent dry cough   [facility], Vickki Muff, [place]   1 year ago Annual physical exam   [place], [place]   1 year ago Papular urticaria   [place], [place]

## 2019-09-02 NOTE — Telephone Encounter (Signed)
This is Simona Huh patient. Please advise if ok to refill inhalers. Per last office note from 03/11/2019 COPD is followed by Pulmonology Dr. Raul Del.

## 2019-09-02 NOTE — Telephone Encounter (Signed)
Medication: PARoxetine (PAXIL) 10 MG tablet [[id number]] , SPIRIVA HANDIHALER 18 MCG inhalation capsule [[id number]] , PROAIR HFA 108 (90 Base) MCG/ACT inhaler [[id number]]   Has the patient contacted their pharmacy? Yes  (Agent: If no, request that the patient contact the pharmacy for the refill.) (Agent: If yes, when and what did the pharmacy advise?)  Preferred Pharmacy (with phone number or street name): [id number] - [place], [address]  Phone:  [phone] Fax:  [phone]     Agent: Please be advised that RX refills may take up to 3 business days. We ask that you follow-up with your pharmacy.

## 2019-09-09 ENCOUNTER — Ambulatory Visit (INDEPENDENT_AMBULATORY_CARE_PROVIDER_SITE_OTHER): Payer: PPO | Admitting: *Deleted

## 2019-09-09 DIAGNOSIS — I639 Cerebral infarction, unspecified: Secondary | ICD-10-CM | POA: Diagnosis not present

## 2019-09-09 LAB — CUP PACEART REMOTE DEVICE CHECK
Date Time Interrogation Session: 20210201000308
Implantable Pulse Generator Implant Date: 20180112

## 2019-09-09 NOTE — Progress Notes (Signed)
ILR Remote 

## 2019-10-09 DIAGNOSIS — J439 Emphysema, unspecified: Secondary | ICD-10-CM | POA: Diagnosis not present

## 2019-10-09 DIAGNOSIS — R0600 Dyspnea, unspecified: Secondary | ICD-10-CM | POA: Diagnosis not present

## 2019-10-09 DIAGNOSIS — K219 Gastro-esophageal reflux disease without esophagitis: Secondary | ICD-10-CM | POA: Diagnosis not present

## 2019-10-10 ENCOUNTER — Ambulatory Visit (INDEPENDENT_AMBULATORY_CARE_PROVIDER_SITE_OTHER): Payer: PPO | Admitting: *Deleted

## 2019-10-10 ENCOUNTER — Other Ambulatory Visit: Payer: Self-pay | Admitting: Family Medicine

## 2019-10-10 DIAGNOSIS — R4701 Aphasia: Secondary | ICD-10-CM

## 2019-10-10 DIAGNOSIS — I63412 Cerebral infarction due to embolism of left middle cerebral artery: Secondary | ICD-10-CM

## 2019-10-10 DIAGNOSIS — I639 Cerebral infarction, unspecified: Secondary | ICD-10-CM

## 2019-10-10 LAB — CUP PACEART REMOTE DEVICE CHECK
Date Time Interrogation Session: 20210304025137
Implantable Pulse Generator Implant Date: 20180112

## 2019-10-10 NOTE — Progress Notes (Signed)
ILR Remote 

## 2019-10-10 NOTE — Telephone Encounter (Signed)
Requested Prescriptions  Pending Prescriptions Disp Refills  . gabapentin (NEURONTIN) 100 MG capsule [Pharmacy Med Name: GABAPENTIN 100 MG CAPSULE] 180 capsule 1    Sig: TAKE 1 CAPSULE (100MG ) BY MOUTH TWICE DAILY     Neurology: Anticonvulsants - gabapentin Passed - 10/10/2019  1:39 AM      Passed - Valid encounter within last 12 months    Recent Outpatient Visits          7 months ago Expressive aphasia   [place], [place]   1 year ago Expressive aphasia   [facility], [place], [place]   1 year ago Recurrent dry cough   [facility], Vickki Muff, [place]   1 year ago Annual physical exam   [place], [place]   1 year ago Papular urticaria   [place], [place]             . amLODipine ([place]) 5 MG tablet [Pharmacy Med Name: AMLODIPINE BESYLATE 5 MG TAB] 90 tablet 1    Sig: TAKE 1 TABLET BY MOUTH EVERY DAY     Cardiovascular:  Calcium Channel Blockers Failed - 10/10/2019  1:39 AM      Failed - Last BP in normal range    BP Readings from Last 1 Encounters:  03/11/19 (!) 142/64         Failed - Valid encounter within last 6 months    Recent Outpatient Visits          7 months ago Expressive aphasia   [facility], Vickki Muff, [place]   1 year ago Expressive aphasia   [place], [place]   1 year ago Recurrent dry cough   [facility], Vickki Muff, [place]   1 year ago Annual physical exam   [place], [place]   1 year ago Papular urticaria   [place], [place]

## 2019-10-24 ENCOUNTER — Other Ambulatory Visit: Payer: Self-pay

## 2019-10-24 ENCOUNTER — Ambulatory Visit (INDEPENDENT_AMBULATORY_CARE_PROVIDER_SITE_OTHER): Payer: PPO | Admitting: Family Medicine

## 2019-10-24 ENCOUNTER — Encounter: Payer: Self-pay | Admitting: Family Medicine

## 2019-10-24 VITALS — BP 165/75 | HR 80 | Temp 95.9°F | Ht 60.25 in | Wt 124.2 lb

## 2019-10-24 DIAGNOSIS — I63412 Cerebral infarction due to embolism of left middle cerebral artery: Secondary | ICD-10-CM

## 2019-10-24 DIAGNOSIS — N1832 Chronic kidney disease, stage 3b: Secondary | ICD-10-CM

## 2019-10-24 DIAGNOSIS — R252 Cramp and spasm: Secondary | ICD-10-CM

## 2019-10-24 DIAGNOSIS — I10 Essential (primary) hypertension: Secondary | ICD-10-CM

## 2019-10-24 NOTE — Progress Notes (Signed)
Patient: Stephanie [name] Female    DOB: Feb 08, 1936   84 y.o.   MRN: [id number] Visit Date: 10/24/2019  Today's Provider: Vernie Murders, PA   Chief Complaint  Patient presents with  . Leg Pain   Subjective:     Leg Pain  Incident onset: 2 months ago  There was no injury mechanism. The pain is present in the left leg and right leg. The quality of the pain is described as cramping. The pain has been worsening since onset. She reports no foreign bodies present. The symptoms are aggravated by weight bearing and movement (standing ). She has tried nothing for the symptoms.   Past Medical History:  Diagnosis Date  . Arthritis   . Baker's cyst of knee, right    a. 08/2016 U/S: Baker's cyst noted in R popliteal fossa.  . Complication of anesthesia   . COPD (chronic obstructive pulmonary disease) ([place])    a. Home O2 started 08/2017.  Marland Kitchen Coronary artery disease    a. prior h/o stenting ~ 2005.  Marland Kitchen Depression   . Edema   . History of cardiac monitoring    a. 08/2016 s/p LINQ placement following CVA. No AFib to date (09/2017).  Marland Kitchen History of pneumonia   . Hx of ischemic left MCA stroke    a. 08/2016 large posterior L MCA territory infarct in the setting of LLE DVT and suggestion of PFO on transcranial dopplers w/ bubble study (no PFO reported on echocardiogram)-->Eliquis.  . Left leg DVT ([place])    a. 08/2016 U/S: subacute DVT in prox L popliteal vein w/ chronic superficial thrombosis in the L greater saphenous vein from the mid to prox L calf-->Eliquis.  Marland Kitchen PFO (patent foramen ovale)    a. 08/2016 Abnl TCD w/ bubble study suggesting large PFO. Not reported on echocardiogram.  . PONV (postoperative nausea and vomiting)    Patient Active Problem List   Diagnosis Date Noted  . CAP (community acquired pneumonia) 09/15/2017  . Sepsis ([place]) 09/12/2017  . History of stroke 09/06/2017  . Essential hypertension 04/14/2017  . PFO (patent foramen ovale)   . DVT (deep venous thrombosis)  ([place])   . Cerebrovascular accident (CVA) due to embolism of left middle cerebral artery ([place])   . Expressive aphasia   . Elevated troponin 08/18/2016  . Mild malnutrition ([place]) 08/18/2016  . Acute CVA (cerebrovascular accident) ([place]) 08/17/2016  . Acute exacerbation of chronic obstructive pulmonary disease (COPD) ([place]) 07/19/2016  . Depression 05/03/2016  . Mild dementia ([place]) 04/19/2016  . COPD, moderate ([place]) 03/05/2015  . Anxiety and depression 03/04/2015  . Arteriosclerosis of coronary artery 03/04/2015  . CAFL (chronic airflow limitation) ([place]) 03/04/2015  . Breathlessness on exertion 03/04/2015  . HLD (hyperlipidemia) 03/04/2015  . Osteopenia 03/04/2015  . Peptic ulcer 03/04/2015  . Amnesia 03/20/2014  . Disordered sleep 03/20/2014  . Allergy to environmental factors 11/14/2013  . Gastroduodenal ulcer 11/14/2013  . GI bleed 11/14/2013   Past Surgical History:  Procedure Laterality Date  . BALLOON ANGIOPLASTY, ARTERY  05/17/2009  . CATARACT EXTRACTION W/PHACO Left 05/24/2016   Procedure: CATARACT EXTRACTION PHACO AND INTRAOCULAR LENS PLACEMENT (IOC);  Surgeon: Birder Robson, MD;  Location: [facility] ORS;  Service: Ophthalmology;  Laterality: Left;  Lot# [id number] H [place]:   00:52.3 AP%:   27.3 CDE:  14.26   . CATARACT EXTRACTION W/PHACO Right 06/14/2016   Procedure: CATARACT EXTRACTION PHACO AND INTRAOCULAR LENS PLACEMENT (IOC);  Surgeon: Birder Robson, MD;  Location: [facility]  ORS;  Service: Ophthalmology;  Laterality: Right;  Lot# [id number] H [place]: 01:13.4 AP%: 24.4 CDE: 17.85  . CORONARY ANGIOPLASTY     STENT  . EP IMPLANTABLE DEVICE N/A 08/19/2016   Procedure: Loop Recorder Insertion;  Surgeon: Will Meredith Leeds, MD;  Location: [facility];  Service: Cardiovascular;  Laterality: N/A;   Family History  Problem Relation Age of Onset  . Hypertension Mother   . Diabetes Mother   . Pancreatic cancer Mother 16  . Cancer Mother   . Suicidality Father 47  . Hypertension  Father   . Alcohol abuse Father   . Hypertension Sister   . Heart disease Sister   . Kidney disease Sister   . Varicose Veins Sister   . Parkinson's disease Sister     Allergies  Allergen Reactions  . Duloxetine Hcl     unknown  . Influenza Vaccines     unknown  . Venlafaxine     GI distress    Current Outpatient Medications:  .  acetaminophen (TYLENOL) 500 MG tablet, Take 1,000 mg by mouth 2 (two) times daily. , Disp: , Rfl:  .  albuterol (PROAIR HFA) 108 (90 Base) MCG/ACT inhaler, INHALE TWO PUFFS INTO LUNGS EVERY 6 HOURS AS NEEDED, Disp: 8 g, Rfl: 4 .  amLODipine (NORVASC) 5 MG tablet, TAKE 1 TABLET BY MOUTH EVERY DAY, Disp: 30 tablet, Rfl: 0 .  Calcium Carb-Cholecalciferol (CALCIUM 600+D) 600-800 MG-UNIT TABS, Take 1 tablet by mouth daily. , Disp: , Rfl:  .  cetirizine (ZYRTEC) 10 MG tablet, Take 10 mg by mouth daily., Disp: , Rfl:  .  docusate sodium (COLACE) 250 MG capsule, Take 100 mg by mouth at bedtime. , Disp: , Rfl:  .  ELIQUIS 5 MG TABS tablet, TAKE 1 TABLET BY MOUTH TWICE A DAY, Disp: 60 tablet, Rfl: 5 .  gabapentin (NEURONTIN) 100 MG capsule, TAKE 1 CAPSULE (100MG ) BY MOUTH TWICE DAILY, Disp: 180 capsule, Rfl: 1 .  montelukast (SINGULAIR) 10 MG tablet, Take 10 mg by mouth at bedtime., Disp: , Rfl:  .  Multiple Vitamin (MULTIVITAMIN) tablet, Take 1 tablet by mouth daily., Disp: , Rfl:  .  omeprazole (PRILOSEC) 20 MG capsule, TAKE 1 CAPSULE BY MOUTH EVERY DAY, Disp: 90 capsule, Rfl: 1 .  PARoxetine (PAXIL) 10 MG tablet, TAKE 1 TABLET (10MG ) IN THE MORNING AND 2 TABLETS (20MG ) AT BEDTIME, Disp: 270 tablet, Rfl: 1 .  Spacer/Aero Chamber Mouthpiece MISC, Use spacer on inhalers for better administration., Disp: 1 each, Rfl: 1 .  SYMBICORT 160-4.5 MCG/ACT inhaler, INHALE TWO PUFFS INTO THE LUNGS TWICE A DAY, Disp: 30.6 Inhaler, Rfl: 3 .  tiotropium (SPIRIVA HANDIHALER) 18 MCG inhalation capsule, Place 1 capsule (18 mcg total) into inhaler and inhale daily. One puff daily,  Disp: 30 capsule, Rfl: 12 .  triamcinolone (KENALOG) 0.025 % cream, w/ Cerave - APPLY 1 APPLICATION(S) TOPICAL DAILY FROM THE NECK DOWN, Disp: , Rfl: 0  Review of Systems  Constitutional: Negative.   Respiratory: Negative.   Cardiovascular: Negative.   Musculoskeletal:       Leg pain     Social History   Tobacco Use  . Smoking status: Former Smoker    Quit date: 08/07/2001    Years since quitting: 18.2  . Smokeless tobacco: Never Used  . Tobacco comment: QUIT IN 2002  Substance Use Topics  . Alcohol use: No    Alcohol/week: 0.0 standard drinks     Objective:   BP (!) 165/75 (BP Location:  Right Arm, Patient Position: Sitting, Cuff Size: Normal)   Pulse 80   Temp (!) 95.9 F (35.5 C) (Temporal)   Ht 5' 0.25" (1.53 m)   Wt 124 lb 3.2 oz (56.3 kg)   BMI 24.06 kg/m  Vitals:   10/24/19 1507  BP: (!) 165/75  Pulse: 80  Temp: (!) 95.9 F (35.5 C)  TempSrc: Temporal  Weight: 124 lb 3.2 oz (56.3 kg)  Height: 5' 0.25" (1.53 m)  Body mass index is 24.06 kg/m.  Physical Exam Constitutional:      General: She is not in acute distress.    Appearance: She is well-developed.  HENT:     Head: Normocephalic and atraumatic.     Right Ear: Hearing normal.     Left Ear: Hearing normal.     Nose: Nose normal.  Eyes:     General: Lids are normal. No scleral icterus.       Right eye: No discharge.        Left eye: No discharge.     Conjunctiva/sclera: Conjunctivae normal.  Cardiovascular:     Rate and Rhythm: Normal rate and regular rhythm.     Heart sounds: Normal heart sounds.  Pulmonary:     Effort: Pulmonary effort is normal. No respiratory distress.  Musculoskeletal:        General: No tenderness. Normal range of motion.     Cervical back: Normal range of motion and neck supple.     Right lower leg: No edema.     Left lower leg: No edema.     Comments: Several superficial varicose veins in both legs and feet. Good pulses throughout.  Skin:    Findings: No lesion or  rash.  Neurological:     Mental Status: She is alert and oriented to person, place, and time.     Motor: No weakness.     Gait: Gait normal.     Deep Tendon Reflexes: Reflexes normal.     Comments: Expressive aphasia slightly improved. Still a happy person.  Psychiatric:        Speech: Speech normal.        Behavior: Behavior normal.        Thought Content: Thought content normal.       Assessment & Plan    1. Muscle cramps No known injury. Good ROM in all extremities. Has notice cramps in the left hip and calves over the past 2 months. Family trying to encourage her to use a walker for support and fall prevention. No recent falls. Check labs and recommend extra fluids with fresh fruits and vegetables. Follow up pending lab reports. No joint pains or crepitus. - CBC with Differential/Platelet - Comprehensive metabolic panel - Magnesium  2. Essential hypertension Tolerating Amlodipine 5 mg qd without peripheral edema. No chest pains or palpitations. Muscle cramps recently in the left hip and calves. Check CBC, CMP and Magnesium level. - CBC with Differential/Platelet - Comprehensive metabolic panel - Magnesium  3. Cerebrovascular accident (CVA) due to embolism of left middle cerebral artery (HCC) No unilateral weakness. Still has expressive aphasia but a little better keeping some thoughts together with speech. Will check routine lab. Continues Eliquis 5 mg qd. - CBC with Differential/Platelet - Comprehensive metabolic panel - Magnesium  4. Stage 3b chronic kidney disease Last GFR was 35 on 03-11-19. Will recheck routine labs. No peripheral edema. - CBC with Differential/Platelet - Comprehensive metabolic panel - Magnesium     Vernie Murders, PA  [facility]  [facility]

## 2019-10-25 LAB — CBC WITH DIFFERENTIAL/PLATELET
Basophils Absolute: 0.1 10*3/uL (ref 0.0–0.2)
Basos: 1 %
EOS (ABSOLUTE): 0.1 10*3/uL (ref 0.0–0.4)
Eos: 2 %
Hematocrit: 32.2 % — ABNORMAL LOW (ref 34.0–46.6)
Hemoglobin: 10.7 g/dL — ABNORMAL LOW (ref 11.1–15.9)
Immature Grans (Abs): 0 10*3/uL (ref 0.0–0.1)
Immature Granulocytes: 0 %
Lymphocytes Absolute: 1.4 10*3/uL (ref 0.7–3.1)
Lymphs: 20 %
MCH: 31.9 pg (ref 26.6–33.0)
MCHC: 33.2 g/dL (ref 31.5–35.7)
MCV: 96 fL (ref 79–97)
Monocytes Absolute: 0.6 10*3/uL (ref 0.1–0.9)
Monocytes: 8 %
Neutrophils Absolute: 5 10*3/uL (ref 1.4–7.0)
Neutrophils: 69 %
Platelets: 262 10*3/uL (ref 150–450)
RBC: 3.35 x10E6/uL — ABNORMAL LOW (ref 3.77–5.28)
RDW: 12.7 % (ref 11.7–15.4)
WBC: 7.2 10*3/uL (ref 3.4–10.8)

## 2019-10-25 LAB — COMPREHENSIVE METABOLIC PANEL
ALT: 15 IU/L (ref 0–32)
AST: 23 IU/L (ref 0–40)
Albumin/Globulin Ratio: 1.9 (ref 1.2–2.2)
Albumin: 4.3 g/dL (ref 3.6–4.6)
Alkaline Phosphatase: 102 IU/L (ref 39–117)
BUN/Creatinine Ratio: 19 (ref 12–28)
BUN: 30 mg/dL — ABNORMAL HIGH (ref 8–27)
Bilirubin Total: <0.2 mg/dL (ref 0.0–1.2)
CO2: 23 mmol/L (ref 20–29)
Calcium: 9.9 mg/dL (ref 8.7–10.3)
Chloride: 103 mmol/L (ref 96–106)
Creatinine, Ser: 1.57 mg/dL — ABNORMAL HIGH (ref 0.57–1.00)
GFR calc Af Amer: 35 mL/min/{1.73_m2} — ABNORMAL LOW (ref >59)
GFR calc non Af Amer: 31 mL/min/{1.73_m2} — ABNORMAL LOW (ref >59)
Globulin, Total: 2.3 g/dL (ref 1.5–4.5)
Glucose: 82 mg/dL (ref 65–99)
Potassium: 4.9 mmol/L (ref 3.5–5.2)
Sodium: 140 mmol/L (ref 134–144)
Total Protein: 6.6 g/dL (ref 6.0–8.5)

## 2019-10-25 LAB — MAGNESIUM: Magnesium: 2.4 mg/dL — ABNORMAL HIGH (ref 1.6–2.3)

## 2019-11-02 ENCOUNTER — Other Ambulatory Visit: Payer: Self-pay | Admitting: Family Medicine

## 2019-11-11 ENCOUNTER — Ambulatory Visit (INDEPENDENT_AMBULATORY_CARE_PROVIDER_SITE_OTHER): Payer: PPO | Admitting: *Deleted

## 2019-11-11 DIAGNOSIS — I639 Cerebral infarction, unspecified: Secondary | ICD-10-CM

## 2019-11-11 LAB — CUP PACEART REMOTE DEVICE CHECK
Date Time Interrogation Session: 20210404035408
Implantable Pulse Generator Implant Date: 20180112

## 2019-11-12 MED ORDER — AMLODIPINE BESYLATE 5 MG PO TABS: 5.0000 mg | ORAL_TABLET | Freq: Every day | ORAL | 1 refills | Status: DC

## 2019-11-12 NOTE — Addendum Note (Signed)
Addended by: Valli Glance F on: 11/12/2019 10:24 AM   Modules accepted: Orders

## 2019-11-12 NOTE — Progress Notes (Signed)
ILR Remote 

## 2019-11-12 NOTE — Telephone Encounter (Signed)
Patient seen 3/18- advised continue per PCP

## 2019-11-12 NOTE — Telephone Encounter (Signed)
Pt's daughter called in to follow up on refill request for amLODIPine Besylate 5 MG TAKE 1 TABLET BY MOUTH EVERY. Made her aware that pt is due a med follow up per denial. Daughter stated that pt had an apt last month.    Pharmacy;  [id number] - [place], [address]  [address], [place] [address]   Please assist.

## 2019-11-25 ENCOUNTER — Other Ambulatory Visit: Payer: Self-pay | Admitting: Family Medicine

## 2019-11-25 DIAGNOSIS — R4701 Aphasia: Secondary | ICD-10-CM

## 2019-11-25 DIAGNOSIS — I63412 Cerebral infarction due to embolism of left middle cerebral artery: Secondary | ICD-10-CM

## 2019-11-25 NOTE — Telephone Encounter (Signed)
Requested medication (s) are due for refill today: yes  Requested medication (s) are on the active medication list: yes  Last refill:  06/04/2019  Future visit scheduled: no  Notes to clinic:  medication d/c'd     Requested Prescriptions  Pending Prescriptions Disp Refills   ELIQUIS 5 MG TABS tablet [Pharmacy Med Name: ELIQUIS 5 MG TABLET] 60 tablet 5    Sig: TAKE 1 TABLET BY [place]      Hematology:  Anticoagulants Failed - 11/25/2019  1:32 AM      Failed - HGB in normal range and within 360 days    Hemoglobin  Date Value Ref Range Status  10/24/2019 10.7 (L) 11.1 - 15.9 g/dL Final          Failed - HCT in normal range and within 360 days    Hematocrit  Date Value Ref Range Status  10/24/2019 32.2 (L) 34.0 - 46.6 % Final          Failed - Cr in normal range and within 360 days    Creat  Date Value Ref Range Status  08/02/2017 1.18 (H) 0.60 - 0.93 mg/dL Final    Comment:    For patients >77 years of age, the reference limit for Creatinine is approximately 13% higher for people identified as African-American. .    Creatinine, Ser  Date Value Ref Range Status  10/24/2019 1.57 (H) 0.57 - 1.00 mg/dL Final          Passed - PLT in normal range and within 360 days    Platelets  Date Value Ref Range Status  10/24/2019 262 150 - 450 x10E3/uL Final          Passed - Valid encounter within last 12 months    Recent Outpatient Visits           1 month ago Muscle cramps   [place], Vickki Muff, [place]   8 months ago Expressive aphasia   [facility], [facility]ickki Muff, [place]   1 year ago Expressive aphasia   [facility], [place], [place]   1 year ago Recurrent dry cough   [place], [place]   1 year ago Annual physical exam   [facility], [place], [place]

## 2019-11-25 NOTE — Telephone Encounter (Signed)
I am puzzled as to why [facility] says this has been discontinued.  It appears to me she is still on it

## 2019-12-12 LAB — CUP PACEART REMOTE DEVICE CHECK
Date Time Interrogation Session: 20210505035818
Implantable Pulse Generator Implant Date: 20180112

## 2019-12-16 ENCOUNTER — Ambulatory Visit (INDEPENDENT_AMBULATORY_CARE_PROVIDER_SITE_OTHER): Payer: PPO | Admitting: *Deleted

## 2019-12-16 DIAGNOSIS — I639 Cerebral infarction, unspecified: Secondary | ICD-10-CM | POA: Diagnosis not present

## 2019-12-16 NOTE — Progress Notes (Signed)
Carelink Summary Report / Loop Recorder 

## 2019-12-25 ENCOUNTER — Telehealth: Payer: Self-pay | Admitting: Family Medicine

## 2019-12-25 NOTE — Telephone Encounter (Signed)
Spoke with patient's daughter who has POA.  She wants to schedule AWV same day a physical that's due in Aug.

## 2020-01-20 ENCOUNTER — Ambulatory Visit (INDEPENDENT_AMBULATORY_CARE_PROVIDER_SITE_OTHER): Payer: PPO | Admitting: *Deleted

## 2020-01-20 DIAGNOSIS — I63412 Cerebral infarction due to embolism of left middle cerebral artery: Secondary | ICD-10-CM | POA: Diagnosis not present

## 2020-01-20 LAB — CUP PACEART REMOTE DEVICE CHECK
Date Time Interrogation Session: 20210613235254
Implantable Pulse Generator Implant Date: 20180112

## 2020-01-20 NOTE — Progress Notes (Signed)
Carelink Summary Report / Loop Recorder 

## 2020-02-23 ENCOUNTER — Other Ambulatory Visit: Payer: Self-pay | Admitting: Family Medicine

## 2020-02-23 NOTE — Telephone Encounter (Signed)
Requested Prescriptions  Pending Prescriptions Disp Refills  . PARoxetine (PAXIL) 10 MG tablet [Pharmacy Med Name: PAROXETINE HCL 10 MG TABLET] 270 tablet 1    Sig: TAKE 1 TABLET (10MG ) IN THE MORNING AND 2 TABLETS (20MG ) AT BEDTIME     Psychiatry:  Antidepressants - SSRI Failed - 02/23/2020  1:13 AM      Failed - Completed PHQ-2 or PHQ-9 in the last 360 days.      Passed - Valid encounter within last 6 months    Recent Outpatient Visits          4 months ago Muscle cramps   [place], [place]   11 months ago Expressive aphasia   [facility], Vickki Muff, [place]   1 year ago Expressive aphasia   [facility], [place], [place]   1 year ago Recurrent dry cough   [facility], [place], [place]   2 years ago Annual physical exam   [place], [place]      Future Appointments            In 1 month [place], Vickki Muff, [place], [place]

## 2020-02-24 ENCOUNTER — Ambulatory Visit (INDEPENDENT_AMBULATORY_CARE_PROVIDER_SITE_OTHER): Payer: PPO | Admitting: *Deleted

## 2020-02-24 ENCOUNTER — Telehealth: Payer: Self-pay

## 2020-02-24 DIAGNOSIS — I639 Cerebral infarction, unspecified: Secondary | ICD-10-CM

## 2020-02-24 LAB — CUP PACEART REMOTE DEVICE CHECK
Date Time Interrogation Session: 20210718230623
Implantable Pulse Generator Implant Date: 20180112

## 2020-02-24 NOTE — Telephone Encounter (Signed)
Copied from [place] [id number]. Topic: General - Call Back - No Documentation >> Feb 24, 2020 11:22 AM Gillis Ends D wrote: Reason for CRM:The patient was returning a call to [facility] and she wants her to return the call. The patient is unsure as to what it is in reference to.

## 2020-02-25 NOTE — Progress Notes (Signed)
Carelink Summary Report / Loop Recorder 

## 2020-03-10 DIAGNOSIS — J449 Chronic obstructive pulmonary disease, unspecified: Secondary | ICD-10-CM | POA: Diagnosis not present

## 2020-03-10 DIAGNOSIS — J439 Emphysema, unspecified: Secondary | ICD-10-CM | POA: Diagnosis not present

## 2020-03-10 DIAGNOSIS — R0600 Dyspnea, unspecified: Secondary | ICD-10-CM | POA: Diagnosis not present

## 2020-03-12 ENCOUNTER — Telehealth: Payer: Self-pay

## 2020-03-12 NOTE — Telephone Encounter (Signed)
Copied from [place] [id number]. Topic: General - Call Back - No Documentation >> Mar 12, 2020 11:27 AM Erick Blinks wrote: Pt's daughter wants a call back regarding her mother and receiving the covid 19 vaccine. She wants medical advice from the clinic Best contact: [phone] Daughter Butch Penny

## 2020-03-13 NOTE — Telephone Encounter (Signed)
Stephanie [name] was advised on covid vaccine.

## 2020-03-29 LAB — CUP PACEART REMOTE DEVICE CHECK
Date Time Interrogation Session: 20210819000915
Implantable Pulse Generator Implant Date: 20180112

## 2020-03-30 ENCOUNTER — Ambulatory Visit (INDEPENDENT_AMBULATORY_CARE_PROVIDER_SITE_OTHER): Payer: PPO | Admitting: *Deleted

## 2020-03-30 DIAGNOSIS — I63412 Cerebral infarction due to embolism of left middle cerebral artery: Secondary | ICD-10-CM

## 2020-03-30 NOTE — Progress Notes (Signed)
Carelink Summary Report / Loop Recorder 

## 2020-04-14 ENCOUNTER — Ambulatory Visit: Payer: PPO | Admitting: Family Medicine

## 2020-05-11 ENCOUNTER — Ambulatory Visit: Payer: PPO | Admitting: Family Medicine

## 2020-05-11 NOTE — Progress Notes (Deleted)
{This patient's chart is due for periodic physician review. Please check 'Cosign Required' and forward to your supervising physician.:1}  Established patient visit   Patient: Stephanie [name]   DOB: 12/01/1935   84 y.o. Female  MRN: [id number] Visit Date: 05/11/2020  Today's healthcare provider: Vernie Murders, PA   No chief complaint on file.  Subjective    HPI   Hypertension, follow-up  BP Readings from Last 3 Encounters:  10/24/19 (!) 165/75  03/11/19 (!) 142/64  08/30/18 133/71   Wt Readings from Last 3 Encounters:  10/24/19 124 lb 3.2 oz (56.3 kg)  03/11/19 124 lb (56.2 kg)  08/30/18 127 lb 12.8 oz (58 kg)     She was last seen for hypertension 6 months ago.  BP at that visit was 165/75. Management since that visit includes none.  She reports {excellent/good/fair/poor:19665} compliance with treatment. She {is/is not:9024} having side effects. {document side effects if present:1} She is following a {diet:21022986} diet. She {is/is not:9024} exercising. She {does/does not:200015} smoke.  Use of agents associated with hypertension: {bp agents assoc with hypertension:511::"none"}.   Outside blood pressures are {***enter patient reported home BP readings, or 'not being checked':1}. Symptoms: {Yes/No:20286} chest pain {Yes/No:20286} chest pressure  {Yes/No:20286} palpitations {Yes/No:20286} syncope  {Yes/No:20286} dyspnea {Yes/No:20286} orthopnea  {Yes/No:20286} paroxysmal nocturnal dyspnea {Yes/No:20286} lower extremity edema   Pertinent labs: Lab Results  Component Value Date   CHOL 244 (H) 03/11/2019   HDL 109 03/11/2019   LDLCALC 116 (H) 03/11/2019   TRIG 97 03/11/2019   CHOLHDL 2.2 03/11/2019   Lab Results  Component Value Date   NA 140 10/24/2019   K 4.9 10/24/2019   CREATININE 1.57 (H) 10/24/2019   GFRNONAA 31 (L) 10/24/2019   GFRAA 35 (L) 10/24/2019   GLUCOSE 82 10/24/2019     The ASCVD Risk score (Goff DC Jr., et al., 2013) failed to  calculate for the following reasons:   The 2013 ASCVD risk score is only valid for ages 43 to 56   The patient has a prior MI or stroke diagnosis   ---------------------------------------------------------------------------------------------------   {Show patient history (optional):23778::" "}   Medications: Outpatient Medications Prior to Visit  Medication Sig  . acetaminophen (TYLENOL) 500 MG tablet Take 1,000 mg by mouth 2 (two) times daily.   Marland Kitchen albuterol (PROAIR HFA) 108 (90 Base) MCG/ACT inhaler INHALE TWO PUFFS INTO LUNGS EVERY 6 HOURS AS NEEDED  . amLODipine (NORVASC) 5 MG tablet Take 1 tablet (5 mg total) by mouth daily.  . Calcium Carb-Cholecalciferol (CALCIUM 600+D) 600-800 MG-UNIT TABS Take 1 tablet by mouth daily.   . cetirizine (ZYRTEC) 10 MG tablet Take 10 mg by mouth daily.  Marland Kitchen docusate sodium (COLACE) 250 MG capsule Take 100 mg by mouth at bedtime.   Marland Kitchen ELIQUIS 5 MG TABS tablet TAKE 1 TABLET BY MOUTH TWICE A DAY  . gabapentin (NEURONTIN) 100 MG capsule TAKE 1 CAPSULE (100MG ) BY MOUTH TWICE DAILY  . montelukast (SINGULAIR) 10 MG tablet Take 10 mg by mouth at bedtime.  . Multiple Vitamin (MULTIVITAMIN) tablet Take 1 tablet by mouth daily.  Marland Kitchen omeprazole (PRILOSEC) 20 MG capsule TAKE 1 CAPSULE BY MOUTH EVERY DAY  . PARoxetine (PAXIL) 10 MG tablet TAKE 1 TABLET (10MG ) IN THE MORNING AND 2 TABLETS (20MG ) AT BEDTIME  . Spacer/Aero Chamber Mouthpiece MISC Use spacer on inhalers for better administration.  . SYMBICORT 160-4.5 MCG/ACT inhaler INHALE TWO PUFFS INTO THE LUNGS TWICE A DAY  . tiotropium (SPIRIVA HANDIHALER) 18 MCG inhalation  capsule Place 1 capsule (18 mcg total) into inhaler and inhale daily. One puff daily  . triamcinolone (KENALOG) 0.025 % cream w/ Cerave - APPLY 1 APPLICATION(S) TOPICAL DAILY FROM THE NECK DOWN   No facility-administered medications prior to visit.    Review of Systems  {Heme  Chem  Endocrine  Serology  Results Review (optional):23779::"  "}  Objective    There were no vitals taken for this visit. {Show previous vital signs (optional):23777::" "}  Physical Exam  ***  No results found for any visits on 05/11/20.  Assessment & Plan     ***  No follow-ups on file.      {provider attestation***:1}   Vernie Murders, [place] [phone] (phone) [phone] (fax)  [place]

## 2020-05-12 ENCOUNTER — Other Ambulatory Visit: Payer: Self-pay

## 2020-05-12 ENCOUNTER — Ambulatory Visit (INDEPENDENT_AMBULATORY_CARE_PROVIDER_SITE_OTHER): Payer: PPO | Admitting: Family Medicine

## 2020-05-12 ENCOUNTER — Encounter: Payer: Self-pay | Admitting: Family Medicine

## 2020-05-12 VITALS — BP 133/63 | HR 78 | Temp 98.2°F | Wt 121.0 lb

## 2020-05-12 DIAGNOSIS — I63412 Cerebral infarction due to embolism of left middle cerebral artery: Secondary | ICD-10-CM | POA: Diagnosis not present

## 2020-05-12 DIAGNOSIS — R233 Spontaneous ecchymoses: Secondary | ICD-10-CM

## 2020-05-12 DIAGNOSIS — R238 Other skin changes: Secondary | ICD-10-CM | POA: Diagnosis not present

## 2020-05-12 DIAGNOSIS — R4701 Aphasia: Secondary | ICD-10-CM | POA: Diagnosis not present

## 2020-05-12 MED ORDER — APIXABAN 2.5 MG PO TABS: 2.5000 mg | ORAL_TABLET | Freq: Two times a day (BID) | ORAL | 3 refills | Status: DC

## 2020-05-12 NOTE — Progress Notes (Signed)
Established patient visit   Patient: Stephanie [name]   DOB: Nov 09, 1935   84 y.o. Female  MRN: [id number] Visit Date: 05/12/2020  Today's healthcare provider: Vernie Murders, PA   No chief complaint on file.  Subjective    HPI   Patient is an 84 year old female who presents for follow up of CVA on Eliquis. Patient states she is feeling well.  The patient and family are concerned about the dose and how much bruising she is doing.  Past Medical History:  Diagnosis Date   Arthritis    Baker's cyst of knee, right    a. 08/2016 U/S: Baker's cyst noted in R popliteal fossa.   Complication of anesthesia    COPD (chronic obstructive pulmonary disease) ([place])    a. Home O2 started 08/2017.   Coronary artery disease    a. prior h/o stenting ~ 2005.   Depression    Edema    History of cardiac monitoring    a. 08/2016 s/p LINQ placement following CVA. No AFib to date (09/2017).   History of pneumonia    Hx of ischemic left MCA stroke    a. 08/2016 large posterior L MCA territory infarct in the setting of LLE DVT and suggestion of PFO on transcranial dopplers w/ bubble study (no PFO reported on echocardiogram)-->Eliquis.   Left leg DVT ([place])    a. 08/2016 U/S: subacute DVT in prox L popliteal vein w/ chronic superficial thrombosis in the L greater saphenous vein from the mid to prox L calf-->Eliquis.   PFO (patent foramen ovale)    a. 08/2016 Abnl TCD w/ bubble study suggesting large PFO. Not reported on echocardiogram.   PONV (postoperative nausea and vomiting)    Past Surgical History:  Procedure Laterality Date   BALLOON ANGIOPLASTY, ARTERY  05/17/2009   CATARACT EXTRACTION W/PHACO Left 05/24/2016   Procedure: CATARACT EXTRACTION PHACO AND INTRAOCULAR LENS PLACEMENT (IOC);  Surgeon: Birder Robson, MD;  Location: [facility] ORS;  Service: Ophthalmology;  Laterality: Left;  Lot# [id number] H [place]:   00:52.3 AP%:   27.3 CDE:  14.26    CATARACT EXTRACTION W/PHACO Right 06/14/2016    Procedure: CATARACT EXTRACTION PHACO AND INTRAOCULAR LENS PLACEMENT ([place]);  Surgeon: Birder Robson, MD;  Location: [facility] ORS;  Service: Ophthalmology;  Laterality: Right;  Lot# [id number] H [place]: 01:13.4 AP%: 24.4 CDE: 17.85   CORONARY ANGIOPLASTY     STENT   EP IMPLANTABLE DEVICE N/A 08/19/2016   Procedure: Loop Recorder Insertion;  Surgeon: Will Meredith Leeds, MD;  Location: [facility];  Service: Cardiovascular;  Laterality: N/A;   Family History  Problem Relation Age of Onset   Hypertension Mother    Diabetes Mother    Pancreatic cancer Mother 64   Cancer Mother    Suicidality Father 51   Hypertension Father    Alcohol abuse Father    Hypertension Sister    Heart disease Sister    Kidney disease Sister    Varicose Veins Sister    Parkinson's disease Sister    Allergies  Allergen Reactions   Duloxetine Hcl     unknown   Influenza Vaccines     unknown   Venlafaxine     GI distress    Medications: Outpatient Medications Prior to Visit  Medication Sig   acetaminophen (TYLENOL) 500 MG tablet Take 1,000 mg by mouth 2 (two) times daily.    albuterol (PROAIR HFA) 108 (90 Base) MCG/ACT inhaler INHALE TWO PUFFS INTO LUNGS EVERY 6 HOURS  AS NEEDED   amLODipine (NORVASC) 5 MG tablet Take 1 tablet (5 mg total) by mouth daily.   Calcium Carb-Cholecalciferol (CALCIUM 600+D) 600-800 MG-UNIT TABS Take 1 tablet by mouth daily.    cetirizine (ZYRTEC) 10 MG tablet Take 10 mg by mouth daily.   docusate sodium (COLACE) 250 MG capsule Take 100 mg by mouth at bedtime.    ELIQUIS 5 MG TABS tablet TAKE 1 TABLET BY MOUTH TWICE A DAY   gabapentin (NEURONTIN) 100 MG capsule TAKE 1 CAPSULE (100MG ) BY MOUTH TWICE DAILY   montelukast (SINGULAIR) 10 MG tablet Take 10 mg by mouth at bedtime.   Multiple Vitamin (MULTIVITAMIN) tablet Take 1 tablet by mouth daily.   omeprazole (PRILOSEC) 20 MG capsule TAKE 1 CAPSULE BY MOUTH EVERY DAY   PARoxetine (PAXIL) 10 MG tablet TAKE 1 TABLET (10MG ) IN THE  MORNING AND 2 TABLETS (20MG ) AT BEDTIME   Spacer/Aero Chamber Mouthpiece MISC Use spacer on inhalers for better administration.   SYMBICORT 160-4.5 MCG/ACT inhaler INHALE TWO PUFFS INTO THE LUNGS TWICE A DAY   tiotropium (SPIRIVA HANDIHALER) 18 MCG inhalation capsule Place 1 capsule (18 mcg total) into inhaler and inhale daily. One puff daily   triamcinolone (KENALOG) 0.025 % cream w/ Cerave - APPLY 1 APPLICATION(S) TOPICAL DAILY FROM THE NECK DOWN   No facility-administered medications prior to visit.    Review of Systems  Cardiovascular: Negative for chest pain, palpitations and leg swelling.  Neurological: Positive for speech difficulty. Negative for dizziness, tremors, syncope, weakness, numbness and headaches.      Objective    BP 133/63 (BP Location: Right Arm, Patient Position: Sitting, Cuff Size: Normal)   Pulse 78   Temp 98.2 F (36.8 C) (Oral)   Wt 121 lb (54.9 kg)   SpO2 97%   BMI 23.44 kg/m    Physical Exam Constitutional:      General: She is not in acute distress.    Appearance: She is well-developed.  HENT:     Head: Normocephalic and atraumatic.     Right Ear: Hearing and tympanic membrane normal.     Left Ear: Hearing and tympanic membrane normal.     Nose: Nose normal.  Eyes:     General: Lids are normal. No scleral icterus.       Right eye: No discharge.        Left eye: No discharge.     Conjunctiva/sclera: Conjunctivae normal.  Cardiovascular:     Rate and Rhythm: Normal rate and regular rhythm.     Heart sounds: Normal heart sounds.  Pulmonary:     Effort: Pulmonary effort is normal. No respiratory distress.     Breath sounds: Normal breath sounds.  Abdominal:     General: Bowel sounds are normal.     Palpations: Abdomen is soft.  Musculoskeletal:        General: Normal range of motion.     Cervical back: Neck supple.  Skin:    Findings: Bruising present. No lesion or rash.  Neurological:     Mental Status: She is alert and oriented to  person, place, and time.     Comments: Expressive aphasia without muscle weakness in any area.  Psychiatric:        Speech: Speech normal.        Behavior: Behavior normal.        Thought Content: Thought content normal.       No results found for any visits on 05/12/20.  Assessment & Plan  1. Easy bruising Suspect secondary to Eliquis prescribed for  - CBC with Differential/Platelet - Comprehensive metabolic panel  2. Cerebrovascular accident (CVA) due to embolism of left middle cerebral artery (HCC) No weakness, swallowing difficulties or loss of motor function at the present. Only expressive aphasia. Recheck labs and continue Eliquis. - CBC with Differential/Platelet - Comprehensive metabolic panel - apixaban (ELIQUIS) 2.5 MG TABS tablet; Take 1 tablet (2.5 mg total) by mouth 2 (two) times daily.  Dispense: 180 tablet; Refill: 3  3. Expressive aphasia Stable without progression. - apixaban (ELIQUIS) 2.5 MG TABS tablet; Take 1 tablet (2.5 mg total) by mouth 2 (two) times daily.  Dispense: 180 tablet; Refill: 3   No follow-ups on file.      Andres Shad, PA, have reviewed all documentation for this visit. The documentation on 05/12/20 for the exam, diagnosis, procedures, and orders are all accurate and complete.    Vernie Murders, [place] [phone] (phone) [phone] (fax)  [place]

## 2020-05-13 LAB — CBC WITH DIFFERENTIAL/PLATELET
Basophils Absolute: 0.1 10*3/uL (ref 0.0–0.2)
Basos: 1 %
EOS (ABSOLUTE): 0.1 10*3/uL (ref 0.0–0.4)
Eos: 2 %
Hematocrit: 32.3 % — ABNORMAL LOW (ref 34.0–46.6)
Hemoglobin: 10.6 g/dL — ABNORMAL LOW (ref 11.1–15.9)
Immature Grans (Abs): 0 10*3/uL (ref 0.0–0.1)
Immature Granulocytes: 0 %
Lymphocytes Absolute: 1.3 10*3/uL (ref 0.7–3.1)
Lymphs: 19 %
MCH: 31.5 pg (ref 26.6–33.0)
MCHC: 32.8 g/dL (ref 31.5–35.7)
MCV: 96 fL (ref 79–97)
Monocytes Absolute: 0.5 10*3/uL (ref 0.1–0.9)
Monocytes: 8 %
Neutrophils Absolute: 4.7 10*3/uL (ref 1.4–7.0)
Neutrophils: 70 %
Platelets: 238 10*3/uL (ref 150–450)
RBC: 3.37 x10E6/uL — ABNORMAL LOW (ref 3.77–5.28)
RDW: 12.4 % (ref 11.7–15.4)
WBC: 6.7 10*3/uL (ref 3.4–10.8)

## 2020-05-13 LAB — COMPREHENSIVE METABOLIC PANEL
ALT: 16 IU/L (ref 0–32)
AST: 20 IU/L (ref 0–40)
Albumin/Globulin Ratio: 1.9 (ref 1.2–2.2)
Albumin: 4.3 g/dL (ref 3.6–4.6)
Alkaline Phosphatase: 95 IU/L (ref 44–121)
BUN/Creatinine Ratio: 20 (ref 12–28)
BUN: 33 mg/dL — ABNORMAL HIGH (ref 8–27)
Bilirubin Total: 0.2 mg/dL (ref 0.0–1.2)
CO2: 23 mmol/L (ref 20–29)
Calcium: 9.8 mg/dL (ref 8.7–10.3)
Chloride: 103 mmol/L (ref 96–106)
Creatinine, Ser: 1.67 mg/dL — ABNORMAL HIGH (ref 0.57–1.00)
GFR calc Af Amer: 33 mL/min/{1.73_m2} — ABNORMAL LOW (ref >59)
GFR calc non Af Amer: 28 mL/min/{1.73_m2} — ABNORMAL LOW (ref >59)
Globulin, Total: 2.3 g/dL (ref 1.5–4.5)
Glucose: 87 mg/dL (ref 65–99)
Potassium: 4.6 mmol/L (ref 3.5–5.2)
Sodium: 140 mmol/L (ref 134–144)
Total Protein: 6.6 g/dL (ref 6.0–8.5)

## 2020-05-14 ENCOUNTER — Ambulatory Visit: Payer: PPO | Admitting: Family Medicine

## 2020-05-17 ENCOUNTER — Other Ambulatory Visit: Payer: Self-pay | Admitting: Family Medicine

## 2020-05-17 NOTE — Telephone Encounter (Signed)
Requested Prescriptions  Pending Prescriptions Disp Refills   amLODipine (NORVASC) 5 MG tablet [Pharmacy Med Name: AMLODIPINE BESYLATE 5 MG TAB] 90 tablet 1    Sig: TAKE 1 TABLET BY MOUTH EVERY DAY     Cardiovascular:  Calcium Channel Blockers Passed - 05/17/2020  1:35 AM      Passed - Last BP in normal range    BP Readings from Last 1 Encounters:  05/12/20 133/63         Passed - Valid encounter within last 6 months    Recent Outpatient Visits          5 days ago Easy bruising   [place], [place]   6 months ago Muscle cramps   [facility], [place], [place]   1 year ago Expressive aphasia   [facility], [place], [place]   1 year ago Expressive aphasia   [facility], [place], [place]   1 year ago Recurrent dry cough   [facility], [place], [place]              PARoxetine (PAXIL) 10 MG tablet [Pharmacy Med Name: PAROXETINE HCL 10 MG TABLET] 270 tablet 0    Sig: TAKE 1 TABLET (10MG ) IN THE MORNING AND 2 TABLETS (20MG ) AT BEDTIME     Psychiatry:  Antidepressants - SSRI Passed - 05/17/2020  1:35 AM      Passed - Completed PHQ-2 or PHQ-9 in the last 360 days.      Passed - Valid encounter within last 6 months    Recent Outpatient Visits          5 days ago Easy bruising   [place], [place]   6 months ago Muscle cramps   [facility], [place], [place]   1 year ago Expressive aphasia   [facility], [place], [place]   1 year ago Expressive aphasia   [place], [place]   1 year ago Recurrent dry cough   [facility], [place], [place]

## 2020-05-20 ENCOUNTER — Other Ambulatory Visit: Payer: Self-pay | Admitting: Family Medicine

## 2020-05-20 DIAGNOSIS — R4701 Aphasia: Secondary | ICD-10-CM

## 2020-05-20 DIAGNOSIS — I63412 Cerebral infarction due to embolism of left middle cerebral artery: Secondary | ICD-10-CM

## 2020-05-22 ENCOUNTER — Telehealth: Payer: Self-pay

## 2020-05-22 NOTE — Telephone Encounter (Signed)
Called patient to advised ILR battery has reached RRT as of 02/13/2020. Spoke to patients daughter Butch Penny, [place]), states she is going over to see the patient tonight and will discuss options to leave device in or have it explanted. Advised to unplug the remote box and I will have return kit sent to address. Address verified. Patient marked inactive in [facility], apts. Canceled in [place], Discontinued from [place].

## 2020-06-12 ENCOUNTER — Other Ambulatory Visit: Payer: Self-pay | Admitting: Family Medicine

## 2020-06-12 DIAGNOSIS — I63412 Cerebral infarction due to embolism of left middle cerebral artery: Secondary | ICD-10-CM

## 2020-06-12 DIAGNOSIS — R4701 Aphasia: Secondary | ICD-10-CM

## 2020-06-29 ENCOUNTER — Telehealth: Payer: Self-pay | Admitting: *Deleted

## 2020-06-29 NOTE — Chronic Care Management (AMB) (Signed)
  Chronic Care Management   Note  06/29/2020 Name: Stephanie [name] MRN: [id number] DOB: [birth year]  Stephanie [name] is a 84 y.o. year old female who is a primary care patient of [place], [place], [place]. I reached out to [employer] by phone today in response to a referral sent by [name].     Stephanie [name] was given information about Chronic Care Management services today including:  1. CCM service includes personalized support from designated clinical staff supervised by her physician, including individualized plan of care and coordination with other care providers 2. 24/7 contact phone numbers for assistance for urgent and routine care needs. 3. Service will only be billed when office clinical staff spend 20 minutes or more in a month to coordinate care. 4. Only one practitioner may furnish and bill the service in a calendar month. 5. The patient may stop CCM services at any time (effective at the end of the month) by phone call to the office staff. 6. The patient will be responsible for cost sharing (co-pay) of up to 20% of the service fee (after annual deductible is met).  Patient daughter Stephanie [name] DPR on file  verbally agreed to assistance and services provided by embedded care coordination/care management team today.  Follow up plan: Telephone appointment with care management team member scheduled for: 07/16/2020  [place] [facility]  Direct Dial: [phone]

## 2020-07-16 ENCOUNTER — Telehealth: Payer: PPO

## 2020-07-16 ENCOUNTER — Telehealth: Payer: Self-pay

## 2020-07-16 NOTE — Telephone Encounter (Signed)
  Chronic Care Management   Outreach Note  07/16/2020 Name: Stephanie [name] MRN: [id number] DOB: 01/06/1936  Primary Care Provider: Margo Common, PA-C Reason for referral : Chronic Care Manager    An unsuccessful telephone outreach was attempted today. [name] was referred to the case management team for assistance with care management and care coordination.    Received voice prompt from [employer] assist but unable to leave a voice message.   Follow Up Plan:  A member of the care management team will reach out to [name] again within the next two weeks.    [facility] [phone]

## 2020-07-30 ENCOUNTER — Telehealth: Payer: Self-pay

## 2020-07-30 NOTE — Telephone Encounter (Signed)
  Chronic Care Management   Outreach Note  07/30/2020 Name: Stephanie [name] MRN: [id number] DOB: 04/04/1936  Primary Care Provider: Margo Common, PA-C Reason for referral : Chronic Care Management   An unsuccessful telephone outreach was attempted today. [name] was referred to the case management team for assistance with care management and care coordination.     Follow Up Plan:  A HIPAA compliant voice message was left today requesting a return call.    [facility] Care Management [facility] [phone]

## 2020-08-12 ENCOUNTER — Telehealth: Payer: Self-pay

## 2020-08-12 DIAGNOSIS — D638 Anemia in other chronic diseases classified elsewhere: Secondary | ICD-10-CM

## 2020-08-12 DIAGNOSIS — N184 Chronic kidney disease, stage 4 (severe): Secondary | ICD-10-CM

## 2020-08-12 NOTE — Telephone Encounter (Signed)
Copied from [place] [id number]. Topic: General - Inquiry >> Aug 12, 2020 10:27 AM Scherrie Gerlach wrote: Reason for CRM: Daughter Butch Penny wants to know if Simona Huh wants to just order labs for the pt and not have her come in due to the covid cases? She would like call back to advise, because she cancelled pt's 08/17/20 appt

## 2020-08-12 NOTE — Telephone Encounter (Signed)
Please advise. Thanks.  

## 2020-08-12 NOTE — Telephone Encounter (Signed)
Need blood tests (BMP and CBC) for evaluation of CKD (low GFR) and anemia of chronic disease.

## 2020-08-17 ENCOUNTER — Ambulatory Visit: Payer: Self-pay | Admitting: Family Medicine

## 2020-08-20 ENCOUNTER — Telehealth: Payer: Self-pay | Admitting: Family Medicine

## 2020-08-20 ENCOUNTER — Other Ambulatory Visit: Payer: Self-pay

## 2020-08-20 NOTE — Telephone Encounter (Signed)
Pt's daughter called in via [place] to asked if lab orders had been placed. And request call back once orders have been placed. Please advise. Thanks TNP

## 2020-08-20 NOTE — Telephone Encounter (Signed)
GFR low at 28 indicates Stage 4 Chronic Kidney disease (DX code N18.4)and Hgb at 10 for a couple years indicates anemia of chronic disease (D63.8).

## 2020-08-20 NOTE — Telephone Encounter (Signed)
Please associate diagnosis.  She does not have either on her problem list and it is requiring a code more specific

## 2020-08-25 ENCOUNTER — Ambulatory Visit: Payer: Self-pay

## 2020-08-25 DIAGNOSIS — I10 Essential (primary) hypertension: Secondary | ICD-10-CM

## 2020-08-25 DIAGNOSIS — I63412 Cerebral infarction due to embolism of left middle cerebral artery: Secondary | ICD-10-CM

## 2020-08-26 DIAGNOSIS — N184 Chronic kidney disease, stage 4 (severe): Secondary | ICD-10-CM | POA: Diagnosis not present

## 2020-08-26 DIAGNOSIS — D638 Anemia in other chronic diseases classified elsewhere: Secondary | ICD-10-CM | POA: Diagnosis not present

## 2020-08-27 ENCOUNTER — Other Ambulatory Visit: Payer: Self-pay | Admitting: Family Medicine

## 2020-08-27 ENCOUNTER — Other Ambulatory Visit: Payer: Self-pay

## 2020-08-27 DIAGNOSIS — N184 Chronic kidney disease, stage 4 (severe): Secondary | ICD-10-CM

## 2020-08-27 LAB — CBC WITH DIFFERENTIAL/PLATELET
Basophils Absolute: 0 10*3/uL (ref 0.0–0.2)
Basos: 0 %
EOS (ABSOLUTE): 0.1 10*3/uL (ref 0.0–0.4)
Eos: 1 %
Hematocrit: 31.2 % — ABNORMAL LOW (ref 34.0–46.6)
Hemoglobin: 10.1 g/dL — ABNORMAL LOW (ref 11.1–15.9)
Immature Grans (Abs): 0 10*3/uL (ref 0.0–0.1)
Immature Granulocytes: 0 %
Lymphocytes Absolute: 1.2 10*3/uL (ref 0.7–3.1)
Lymphs: 12 %
MCH: 31.1 pg (ref 26.6–33.0)
MCHC: 32.4 g/dL (ref 31.5–35.7)
MCV: 96 fL (ref 79–97)
Monocytes Absolute: 0.7 10*3/uL (ref 0.1–0.9)
Monocytes: 7 %
Neutrophils Absolute: 7.6 10*3/uL — ABNORMAL HIGH (ref 1.4–7.0)
Neutrophils: 80 %
Platelets: 252 10*3/uL (ref 150–450)
RBC: 3.25 x10E6/uL — ABNORMAL LOW (ref 3.77–5.28)
RDW: 12.9 % (ref 11.7–15.4)
WBC: 9.7 10*3/uL (ref 3.4–10.8)

## 2020-08-27 LAB — BASIC METABOLIC PANEL
BUN/Creatinine Ratio: 20 (ref 12–28)
BUN: 34 mg/dL — ABNORMAL HIGH (ref 8–27)
CO2: 20 mmol/L (ref 20–29)
Calcium: 9.5 mg/dL (ref 8.7–10.3)
Chloride: 105 mmol/L (ref 96–106)
Creatinine, Ser: 1.69 mg/dL — ABNORMAL HIGH (ref 0.57–1.00)
GFR calc Af Amer: 32 mL/min/{1.73_m2} — ABNORMAL LOW (ref >59)
GFR calc non Af Amer: 28 mL/min/{1.73_m2} — ABNORMAL LOW (ref >59)
Glucose: 106 mg/dL — ABNORMAL HIGH (ref 65–99)
Potassium: 4.6 mmol/L (ref 3.5–5.2)
Sodium: 141 mmol/L (ref 134–144)

## 2020-08-27 NOTE — Progress Notes (Addendum)
Tried to place order for Nephrology referral with persistent GFR of 28 (stage 4 CKD) and chronic anemia with history of expressive aphasia secondary to CVA.

## 2020-08-28 ENCOUNTER — Other Ambulatory Visit: Payer: Self-pay

## 2020-08-28 DIAGNOSIS — N184 Chronic kidney disease, stage 4 (severe): Secondary | ICD-10-CM

## 2020-09-02 ENCOUNTER — Other Ambulatory Visit: Payer: Self-pay | Admitting: Family Medicine

## 2020-09-02 DIAGNOSIS — R4701 Aphasia: Secondary | ICD-10-CM

## 2020-09-02 DIAGNOSIS — I63412 Cerebral infarction due to embolism of left middle cerebral artery: Secondary | ICD-10-CM

## 2020-09-03 ENCOUNTER — Telehealth: Payer: Self-pay | Admitting: Family Medicine

## 2020-09-03 NOTE — Telephone Encounter (Signed)
Copied from [place] [phone]. Topic: Medicare AWV >> Sep 03, 2020  2:21 PM Cher Nakai R wrote: Reason for CRM:   Left message for patient to call back and schedule Medicare Annual Wellness Visit (AWV) in office.   If not able to come in office, please offer to do virtually or by telephone.   Last AWV 01/07/2019  Please schedule at anytime with [facility] Advisor.  If any questions, please contact me at [phone]

## 2020-09-07 NOTE — Progress Notes (Addendum)
Subjective:   Stephanie [name] is a 85 y.o. female who presents for Medicare Annual (Subsequent) preventive examination.  I connected with Stephanie [name] which is Stephanie [name] daughter and POA,  today by telephone and verified that I am speaking with the correct person using two identifiers. Location patient: home Location provider: work Persons participating in the virtual visit: patient, provider.   I discussed the limitations, risks, security and privacy concerns of performing an evaluation and management service by telephone and the availability of in person appointments. I also discussed with the patient that there may be a patient responsible charge related to this service. The patient expressed understanding and verbally consented to this telephonic visit.    Interactive audio and video telecommunications were attempted between this provider and patient, however failed, due to patient having technical difficulties OR patient did not have access to video capability.  We continued and completed visit with audio only.   Review of Systems    N/A  Cardiac Risk Factors include: advanced age (>28men, >3 women);hypertension     Objective:    There were no vitals filed for this visit. There is no height or weight on file to calculate BMI.  Advanced Directives 09/08/2020 01/07/2019 01/05/2018 09/15/2017 09/15/2017 09/12/2017 09/12/2017  Does Patient Have a Medical Advance Directive? Yes Yes Yes Yes Yes Yes Yes  Type of Paramedic of [place];Living will [place];Living will [place];Living will [place] of facility DNR (pink MOST or yellow form) Out of facility DNR (pink MOST or yellow form);[place];Living will [place];Living will  Does patient want to make changes to medical advance directive? - - - No - Patient declined No - Patient declined No - Patient  declined -  Copy of [place] in Chart? Yes - validated most recent copy scanned in chart (See row information) Yes - validated most recent copy scanned in chart (See row information) Yes Yes - - No - copy requested  Would patient like information on creating a medical advance directive? - - - - - - -  Pre-existing out of facility DNR order (yellow form or pink MOST form) - - - Yellow form placed in chart (order not valid for inpatient use) Yellow form placed in chart (order not valid for inpatient use) - -    Current Medications (verified) Outpatient Encounter Medications as of 09/08/2020  Medication Sig   acetaminophen (TYLENOL) 500 MG tablet Take 1,000 mg by mouth 2 (two) times daily.    albuterol (PROAIR HFA) 108 (90 Base) MCG/ACT inhaler INHALE TWO PUFFS INTO LUNGS EVERY 6 HOURS AS NEEDED   amLODipine (NORVASC) 5 MG tablet TAKE 1 TABLET BY MOUTH EVERY DAY   apixaban (ELIQUIS) 2.5 MG TABS tablet Take 1 tablet (2.5 mg total) by mouth 2 (two) times daily.   Calcium Carb-Cholecalciferol 600-800 MG-UNIT TABS Take 1 tablet by mouth daily.    docusate sodium (COLACE) 250 MG capsule Take 100 mg by mouth at bedtime.    gabapentin (NEURONTIN) 100 MG capsule TAKE 1 CAPSULE (100MG ) BY MOUTH TWICE DAILY   loratadine (CLARITIN) 10 MG tablet Take 10 mg by mouth daily.   montelukast (SINGULAIR) 10 MG tablet Take 10 mg by mouth at bedtime.   Multiple Vitamin (MULTIVITAMIN) tablet Take 1 tablet by mouth daily.   omeprazole (PRILOSEC) 20 MG capsule TAKE 1 CAPSULE BY MOUTH EVERY DAY   PARoxetine (PAXIL) 10 MG  tablet TAKE 1 TABLET (10MG ) IN THE MORNING AND 2 TABLETS (20MG ) AT BEDTIME   SYMBICORT 160-4.5 MCG/ACT inhaler INHALE TWO PUFFS INTO THE LUNGS TWICE A DAY   tiotropium (SPIRIVA HANDIHALER) 18 MCG inhalation capsule Place 1 capsule (18 mcg total) into inhaler and inhale daily. One puff daily   Spacer/Aero Chamber Mouthpiece MISC Use spacer on inhalers for better administration. (Patient  not taking: Reported on 09/08/2020)   triamcinolone (KENALOG) 0.025 % cream w/ Cerave - APPLY 1 APPLICATION(S) TOPICAL DAILY FROM THE NECK DOWN (Patient not taking: Reported on 09/08/2020)   No facility-administered encounter medications on file as of 09/08/2020.    Allergies (verified) Duloxetine hcl, Influenza vaccines, and Venlafaxine   History: Past Medical History:  Diagnosis Date   Arthritis    Baker's cyst of knee, right    a. 08/2016 U/S: Baker's cyst noted in R popliteal fossa.   Complication of anesthesia    COPD (chronic obstructive pulmonary disease) ([employer])    a. Home O2 started 08/2017.   Coronary artery disease    a. prior h/o stenting ~ 2005.   Depression    Edema    History of cardiac monitoring    a. 08/2016 s/p LINQ placement following CVA. No AFib to date (09/2017).   History of pneumonia    Hx of ischemic left MCA stroke    a. 08/2016 large posterior L MCA territory infarct in the setting of LLE DVT and suggestion of PFO on transcranial dopplers w/ bubble study (no PFO reported on echocardiogram)-->Eliquis.   Hypertension    Left leg DVT ([place])    a. 08/2016 U/S: subacute DVT in prox L popliteal vein w/ chronic superficial thrombosis in the L greater saphenous vein from the mid to prox L calf-->Eliquis.   PFO (patent foramen ovale)    a. 08/2016 Abnl TCD w/ bubble study suggesting large PFO. Not reported on echocardiogram.   PONV (postoperative nausea and vomiting)    Past Surgical History:  Procedure Laterality Date   BALLOON ANGIOPLASTY, ARTERY  05/17/2009   CATARACT EXTRACTION W/PHACO Left 05/24/2016   Procedure: CATARACT EXTRACTION PHACO AND INTRAOCULAR LENS PLACEMENT (IOC);  Surgeon: Birder Robson, MD;  Location: [facility] ORS;  Service: Ophthalmology;  Laterality: Left;  Lot# [id number] H [place]:   00:52.3 AP%:   27.3 CDE:  14.26    CATARACT EXTRACTION W/PHACO Right 06/14/2016   Procedure: CATARACT EXTRACTION PHACO AND INTRAOCULAR LENS PLACEMENT ([place]);  Surgeon: Birder Robson, MD;  Location: [facility] ORS;  Service: Ophthalmology;  Laterality: Right;  Lot# [id number] H [place]: 01:13.4 AP%: 24.4 CDE: 17.85   CORONARY ANGIOPLASTY     STENT   EP IMPLANTABLE DEVICE N/A 08/19/2016   Procedure: Loop Recorder Insertion;  Surgeon: Will Meredith Leeds, MD;  Location: [facility];  Service: Cardiovascular;  Laterality: N/A;   Family History  Problem Relation Age of Onset   Hypertension Mother    Diabetes Mother    Pancreatic cancer Mother 62   Cancer Mother    Suicidality Father 51   Hypertension Father    Alcohol abuse Father    Hypertension Sister    Heart disease Sister    Kidney disease Sister    Varicose Veins Sister    Parkinson's disease Sister    Social History   Socioeconomic History   Marital status: Widowed    Spouse name: Not on file   Number of children: 4   Years of education: Not on file   Highest education level: 12th grade  Occupational History   Occupation: retired  Tobacco Use   Smoking status: Former Smoker    Quit date: 08/07/2001    Years since quitting: 19.1   Smokeless tobacco: Never Used   Tobacco comment: QUIT IN 2002  Vaping Use   Vaping Use: Never used  Substance and Sexual Activity   Alcohol use: No    Alcohol/week: 0.0 standard drinks   Drug use: No   Sexual activity: Not on file  Other Topics Concern   Not on file  Social History Narrative   Lives alone   Social Determinants of Health   Financial Resource Strain: Low Risk    Difficulty of Paying Living Expenses: Not hard at all  Food Insecurity: No Food Insecurity   Worried About Charity fundraiser in the Last Year: Never true   [place] in the Last Year: Never true  Transportation Needs: No Transportation Needs   Lack of Transportation (Medical): No   Lack of Transportation (Non-Medical): No  Physical Activity: Inactive   Days of Exercise per Week: 0 days   Minutes of Exercise per Session: 0 min  Stress: No Stress Concern Present   Feeling  of Stress : Only a little  Social Connections: Socially Isolated   Frequency of Communication with Friends and Family: Patient refused   Frequency of Social Gatherings with Friends and Family: More than three times a week   Attends Religious Services: Never   Marine scientist or Organizations: No   Attends Archivist Meetings: Never   Marital Status: Widowed    Tobacco Counseling Counseling given: Not Answered Comment: QUIT IN 2002   Clinical Intake:  Pre-visit preparation completed: Yes  Pain : No/denies pain     Nutritional Risks: None Diabetes: No  How often do you need to have someone help you when you read instructions, pamphlets, or other written materials from your doctor or pharmacy?: 2 - Rarely  Diabetic? No  Interpreter Needed?: No  Information entered by :: [facility], LPN   Activities of Daily Living In your present state of health, do you have any difficulty performing the following activities: 09/08/2020 05/12/2020  Hearing? N N  Vision? Y N  Comment Has issues with peripheral due to previous stroke. -  Difficulty concentrating or making decisions? N N  Walking or climbing stairs? Y N  Comment Due to SOB. -  Dressing or bathing? N N  Doing errands, shopping? Y N  Comment Does not drive. -  Preparing Food and eating ? N -  Using the Toilet? N -  In the past six months, have you accidently leaked urine? N -  Do you have problems with loss of bowel control? N -  Managing your Medications? Y -  Comment Daughter manages medications. -  Managing your Finances? Y -  Comment Daughter manages finances. -  Housekeeping or managing your Housekeeping? N -  Some recent data might be hidden    Patient Care Team: Chrismon, Vickki Muff, PA-C as PCP - General (Physician Assistant) Minna Merritts, MD as PCP - Cardiology (Cardiology) Erby Pian, MD as Referring Physician (Specialist) Jannet Mantis, MD as Consulting Physician  (Dermatology) [place], [place] [facility]) Lyla Son, MD as Consulting Physician (Nephrology)  Indicate any recent Medical Services you may have received from other than [facility] providers in the past year (date may be approximate).     Assessment:   This is a routine wellness examination for Stephanie [name].  Hearing/Vision screen No exam data present  Dietary issues and exercise activities discussed: Current Exercise Habits: The patient does not participate in regular exercise at present, Exercise limited by: neurologic condition(s)  Goals      DIET - INCREASE WATER INTAKE     Recommend to drink at least 6-8 8oz glasses of water per day.        Depression Screen PHQ 2/9 Scores 09/08/2020 05/12/2020 01/07/2019 05/21/2018 01/05/2018 01/05/2018 10/05/2016  PHQ - 2 Score 1 0 0 0 0 0 1  PHQ- 9 Score - 0 - - 0 - -    Fall Risk Fall Risk  09/08/2020 05/12/2020 10/24/2019 01/07/2019 05/21/2018  Falls in the past year? 0 0 0 0 No  Number falls in past yr: 0 - 0 - -  Injury with Fall? 0 - 0 - -  Follow up - - - - -    FALL RISK PREVENTION PERTAINING TO THE HOME:  Any stairs in or around the home? No  If so, are there any without handrails? No  Home free of loose throw rugs in walkways, pet beds, electrical cords, etc? Yes  Adequate lighting in your home to reduce risk of falls? Yes   ASSISTIVE DEVICES UTILIZED TO PREVENT FALLS:  Life alert? Yes  Use of a cane, walker or w/c? Yes  Grab bars in the bathroom? Yes  Shower chair or bench in shower? Yes  Elevated toilet seat or a handicapped toilet? No    Cognitive Function: Unable to complete due to speaking with daughter.  MMSE - Mini Mental State Exam 12/15/2016  Orientation to time 1  Orientation to Place 5  Registration 3  Attention/ Calculation 0  Recall 1  Language- name 2 objects 2  Language- repeat 0  Language- follow 3 step command 3  Language- read & follow direction 1  Write a sentence 0  Copy design 0  Total score  16        Immunizations Immunization History  Administered Date(s) Administered   PFIZER(Purple Top)SARS-COV-2 Vaccination 03/16/2020, 04/06/2020   Pneumococcal Conjugate-13 06/26/2014   Pneumococcal Polysaccharide-23 01/17/2001, 07/19/2006, 02/13/2008   Zoster 02/13/2008    TDAP status: Due, Education has been provided regarding the importance of this vaccine. Advised may receive this vaccine at local pharmacy or Health Dept. Aware to provide a copy of the vaccination record if obtained from local pharmacy or Health Dept. Verbalized acceptance and understanding.  Flu Vaccine status: Declined, Education has been provided regarding the importance of this vaccine but patient still declined. Advised may receive this vaccine at local pharmacy or Health Dept. Aware to provide a copy of the vaccination record if obtained from local pharmacy or Health Dept. Verbalized acceptance and understanding.  Pneumococcal vaccine status: Up to date  Covid-19 vaccine status: Completed vaccines  Qualifies for Shingles Vaccine? Yes   Zostavax completed Yes   Shingrix Completed?: No.    Education has been provided regarding the importance of this vaccine. Patient has been advised to call insurance company to determine out of pocket expense if they have not yet received this vaccine. Advised may also receive vaccine at local pharmacy or Health Dept. Verbalized acceptance and understanding.  Screening Tests Health Maintenance  Topic Date Due   DEXA SCAN  08/25/2020   INFLUENZA VACCINE  11/05/2020 (Originally 03/08/2020)   TETANUS/TDAP  09/08/2021 (Originally 01/15/1955)   COVID-19 Vaccine (3 - Booster for Pfizer series) 10/05/2020   PNA vac Low Risk Adult  Completed  Health Maintenance  Health Maintenance Due  Topic Date Due   DEXA SCAN  08/25/2020    Colorectal cancer screening: No longer required.   Mammogram status: No longer required due to age.  Bone Density status: Ordered today. Pt  provided with contact info and advised to call to schedule appt.  Lung Cancer Screening: (Low Dose CT Chest recommended if Age 41-80 years, 30 pack-year currently smoking OR have quit w/in 15years.) does not qualify.   Additional Screening:  Vision Screening: Recommended annual ophthalmology exams for early detection of glaucoma and other disorders of the eye. Is the patient up to date with their annual eye exam?  Yes  Who is the provider or what is the name of the office in which the patient attends annual eye exams? [facility] If pt is not established with a provider, would they like to be referred to a provider to establish care? No .   Dental Screening: Recommended annual dental exams for proper oral hygiene  Community Resource Referral / Chronic Care Management: CRR required this visit?  No   CCM required this visit?  No      Plan:     I have personally reviewed and noted the following in the patient's chart:   Medical and social history Use of alcohol, tobacco or illicit drugs  Current medications and supplements Functional ability and status Nutritional status Physical activity Advanced directives List of other physicians Hospitalizations, surgeries, and ER visits in previous 12 months Vitals Screenings to include cognitive, depression, and falls Referrals and appointments  In addition, I have reviewed and discussed with patient certain preventive protocols, quality metrics, and best practice recommendations. A written personalized care plan for preventive services as well as general preventive health recommendations were provided to patient.     Stephanie [name], [place]   7/0/1779   Nurse Notes: Daughter states pt will not take a flu shot.   Reviewed screening note and recommendations of Nurse Health Advisor. Agree with documentation and plan.

## 2020-09-08 ENCOUNTER — Ambulatory Visit (INDEPENDENT_AMBULATORY_CARE_PROVIDER_SITE_OTHER): Payer: PPO

## 2020-09-08 ENCOUNTER — Other Ambulatory Visit: Payer: Self-pay

## 2020-09-08 DIAGNOSIS — E2839 Other primary ovarian failure: Secondary | ICD-10-CM | POA: Diagnosis not present

## 2020-09-08 DIAGNOSIS — Z0000 Encounter for general adult medical examination without abnormal findings: Secondary | ICD-10-CM

## 2020-09-08 NOTE — Patient Instructions (Signed)
Stephanie [name] , Thank you for taking time to come for your Medicare Wellness Visit. I appreciate your ongoing commitment to your health goals. Please review the following plan we discussed and let me know if I can assist you in the future.   Screening recommendations/referrals: Colonoscopy: No longer required.  Mammogram: No longer required.  Bone Density: Ordered today. Pt provided with contact info and advised to call to schedule appt. Recommended yearly ophthalmology/optometry visit for glaucoma screening and checkup Recommended yearly dental visit for hygiene and checkup  Vaccinations: Influenza vaccine: Currently due, declined receiving. Pneumococcal vaccine: Completed series Tdap vaccine: Currently due, declined receiving.  Shingles vaccine: Shingrix discussed. Please contact your pharmacy for coverage information.     Advanced directives: Currently on file.  Conditions/risks identified: Recommend to drink at least 6-8 8oz glasses of water per day.  Next appointment: None, declined scheduling a follow up with PCP or an AWV for 2023 at this time.    Preventive Care 83 Years and Older, Female Preventive care refers to lifestyle choices and visits with your health care provider that can promote health and wellness. What does preventive care include?  A yearly physical exam. This is also called an annual well check.  Dental exams once or twice a year.  Routine eye exams. Ask your health care provider how often you should have your eyes checked.  Personal lifestyle choices, including:  Daily care of your teeth and gums.  Regular physical activity.  Eating a healthy diet.  Avoiding tobacco and drug use.  Limiting alcohol use.  Practicing safe sex.  Taking low-dose aspirin every day.  Taking vitamin and mineral supplements as recommended by your health care provider. What happens during an annual well check? The services and screenings done by your health care  provider during your annual well check will depend on your age, overall health, lifestyle risk factors, and family history of disease. Counseling  Your health care provider may ask you questions about your:  Alcohol use.  Tobacco use.  Drug use.  Emotional well-being.  Home and relationship well-being.  Sexual activity.  Eating habits.  History of falls.  Memory and ability to understand (cognition).  Work and work Statistician.  Reproductive health. Screening  You may have the following tests or measurements:  Height, weight, and BMI.  Blood pressure.  Lipid and cholesterol levels. These may be checked every 5 years, or more frequently if you are over 15 years old.  Skin check.  Lung cancer screening. You may have this screening every year starting at age 16 if you have a 30-pack-year history of smoking and currently smoke or have quit within the past 15 years.  Fecal occult blood test (FOBT) of the stool. You may have this test every year starting at age 33.  Flexible sigmoidoscopy or colonoscopy. You may have a sigmoidoscopy every 5 years or a colonoscopy every 10 years starting at age 75.  Hepatitis C blood test.  Hepatitis B blood test.  Sexually transmitted disease (STD) testing.  Diabetes screening. This is done by checking your blood sugar (glucose) after you have not eaten for a while (fasting). You may have this done every 1-3 years.  Bone density scan. This is done to screen for osteoporosis. You may have this done starting at age 39.  Mammogram. This may be done every 1-2 years. Talk to your health care provider about how often you should have regular mammograms. Talk with your health care provider about your test results,  treatment options, and if necessary, the need for more tests. Vaccines  Your health care provider may recommend certain vaccines, such as:  Influenza vaccine. This is recommended every year.  Tetanus, diphtheria, and acellular  pertussis (Tdap, Td) vaccine. You may need a Td booster every 10 years.  Zoster vaccine. You may need this after age 73.  Pneumococcal 13-valent conjugate (PCV13) vaccine. One dose is recommended after age 80.  Pneumococcal polysaccharide (PPSV23) vaccine. One dose is recommended after age 1. Talk to your health care provider about which screenings and vaccines you need and how often you need them. This information is not intended to replace advice given to you by your health care provider. Make sure you discuss any questions you have with your health care provider. Document Released: 08/21/2015 Document Revised: 04/13/2016 Document Reviewed: 05/26/2015 Elsevier Interactive Patient Education  2017 [place] Prevention in the Home Falls can cause injuries. They can happen to people of all ages. There are many things you can do to make your home safe and to help prevent falls. What can I do on the outside of my home?  Regularly fix the edges of walkways and driveways and fix any cracks.  Remove anything that might make you trip as you walk through a door, such as a raised step or threshold.  Trim any bushes or trees on the path to your home.  Use bright outdoor lighting.  Clear any walking paths of anything that might make someone trip, such as rocks or tools.  Regularly check to see if handrails are loose or broken. Make sure that both sides of any steps have handrails.  Any raised decks and porches should have guardrails on the edges.  Have any leaves, snow, or ice cleared regularly.  Use sand or salt on walking paths during winter.  Clean up any spills in your garage right away. This includes oil or grease spills. What can I do in the bathroom?  Use night lights.  Install grab bars by the toilet and in the tub and shower. Do not use towel bars as grab bars.  Use non-skid mats or decals in the tub or shower.  If you need to sit down in the shower, use a plastic,  non-slip stool.  Keep the floor dry. Clean up any water that spills on the floor as soon as it happens.  Remove soap buildup in the tub or shower regularly.  Attach bath mats securely with double-sided non-slip rug tape.  Do not have throw rugs and other things on the floor that can make you trip. What can I do in the bedroom?  Use night lights.  Make sure that you have a light by your bed that is easy to reach.  Do not use any sheets or blankets that are too big for your bed. They should not hang down onto the floor.  Have a firm chair that has side arms. You can use this for support while you get dressed.  Do not have throw rugs and other things on the floor that can make you trip. What can I do in the kitchen?  Clean up any spills right away.  Avoid walking on wet floors.  Keep items that you use a lot in easy-to-reach places.  If you need to reach something above you, use a strong step stool that has a grab bar.  Keep electrical cords out of the way.  Do not use floor polish or wax that makes floors  slippery. If you must use wax, use non-skid floor wax.  Do not have throw rugs and other things on the floor that can make you trip. What can I do with my stairs?  Do not leave any items on the stairs.  Make sure that there are handrails on both sides of the stairs and use them. Fix handrails that are broken or loose. Make sure that handrails are as long as the stairways.  Check any carpeting to make sure that it is firmly attached to the stairs. Fix any carpet that is loose or worn.  Avoid having throw rugs at the top or bottom of the stairs. If you do have throw rugs, attach them to the floor with carpet tape.  Make sure that you have a light switch at the top of the stairs and the bottom of the stairs. If you do not have them, ask someone to add them for you. What else can I do to help prevent falls?  Wear shoes that:  Do not have high heels.  Have rubber  bottoms.  Are comfortable and fit you well.  Are closed at the toe. Do not wear sandals.  If you use a stepladder:  Make sure that it is fully opened. Do not climb a closed stepladder.  Make sure that both sides of the stepladder are locked into place.  Ask someone to hold it for you, if possible.  Clearly mark and make sure that you can see:  Any grab bars or handrails.  First and last steps.  Where the edge of each step is.  Use tools that help you move around (mobility aids) if they are needed. These include:  Canes.  Walkers.  Scooters.  Crutches.  Turn on the lights when you go into a dark area. Replace any light bulbs as soon as they burn out.  Set up your furniture so you have a clear path. Avoid moving your furniture around.  If any of your floors are uneven, fix them.  If there are any pets around you, be aware of where they are.  Review your medicines with your doctor. Some medicines can make you feel dizzy. This can increase your chance of falling. Ask your doctor what other things that you can do to help prevent falls. This information is not intended to replace advice given to you by your health care provider. Make sure you discuss any questions you have with your health care provider. Document Released: 05/21/2009 Document Revised: 12/31/2015 Document Reviewed: 08/29/2014 Elsevier Interactive Patient Education  2017 [employer].

## 2020-09-09 ENCOUNTER — Telehealth: Payer: Self-pay

## 2020-09-09 ENCOUNTER — Other Ambulatory Visit: Payer: Self-pay | Admitting: Nephrology

## 2020-09-09 DIAGNOSIS — N184 Chronic kidney disease, stage 4 (severe): Secondary | ICD-10-CM

## 2020-09-09 DIAGNOSIS — R8290 Unspecified abnormal findings in urine: Secondary | ICD-10-CM | POA: Diagnosis not present

## 2020-09-09 DIAGNOSIS — D631 Anemia in chronic kidney disease: Secondary | ICD-10-CM

## 2020-09-09 DIAGNOSIS — N2581 Secondary hyperparathyroidism of renal origin: Secondary | ICD-10-CM | POA: Diagnosis not present

## 2020-09-09 DIAGNOSIS — I10 Essential (primary) hypertension: Secondary | ICD-10-CM | POA: Diagnosis not present

## 2020-09-09 NOTE — Telephone Encounter (Signed)
Copied from [place] [id number]. Topic: General - Other >> Sep 09, 2020  1:47 PM Keene Breath wrote: Reason for CRM: Patient's daughter called to speak with [facility] regarding a questionnaire for the patient for Medicare.  Please call Butch Penny) to discuss at [phone] to discuss.

## 2020-09-16 ENCOUNTER — Ambulatory Visit
Admission: RE | Admit: 2020-09-16 | Discharge: 2020-09-16 | Disposition: A | Discharge status: Home / Self Care | Payer: PPO | Source: Ambulatory Visit | Attending: Nephrology | Admitting: Nephrology

## 2020-09-16 ENCOUNTER — Other Ambulatory Visit: Payer: Self-pay

## 2020-09-16 DIAGNOSIS — D631 Anemia in chronic kidney disease: Secondary | ICD-10-CM | POA: Diagnosis not present

## 2020-09-16 DIAGNOSIS — N184 Chronic kidney disease, stage 4 (severe): Secondary | ICD-10-CM | POA: Insufficient documentation

## 2020-09-16 DIAGNOSIS — R8290 Unspecified abnormal findings in urine: Secondary | ICD-10-CM | POA: Insufficient documentation

## 2020-09-16 DIAGNOSIS — N281 Cyst of kidney, acquired: Secondary | ICD-10-CM | POA: Diagnosis not present

## 2020-10-05 ENCOUNTER — Other Ambulatory Visit: Payer: Self-pay | Admitting: Family Medicine

## 2020-10-05 DIAGNOSIS — R4701 Aphasia: Secondary | ICD-10-CM

## 2020-10-05 DIAGNOSIS — I63412 Cerebral infarction due to embolism of left middle cerebral artery: Secondary | ICD-10-CM

## 2020-10-07 DIAGNOSIS — R609 Edema, unspecified: Secondary | ICD-10-CM | POA: Diagnosis not present

## 2020-10-07 DIAGNOSIS — N1832 Chronic kidney disease, stage 3b: Secondary | ICD-10-CM | POA: Diagnosis not present

## 2020-10-07 DIAGNOSIS — D631 Anemia in chronic kidney disease: Secondary | ICD-10-CM | POA: Diagnosis not present

## 2020-10-07 DIAGNOSIS — I10 Essential (primary) hypertension: Secondary | ICD-10-CM | POA: Diagnosis not present

## 2020-10-07 DIAGNOSIS — R809 Proteinuria, unspecified: Secondary | ICD-10-CM | POA: Diagnosis not present

## 2020-10-14 NOTE — Chronic Care Management (AMB) (Signed)
  Chronic Care Management   Outreach Note   Name: Stephanie [name] MRN: [id number] DOB: [birth year]  Primary Care Provider: Margo Common, PA-C Reason for referral : Chronic Care Management   Stephanie [name] was referred to the case management team for assistance with care management and care coordination. Spoke briefly today with her caregiver/daughter Stephanie [name]. Stephanie [name] reports that she has transitioned well.  We discussed need for chronic disease management and care coordination. Stephanie [name] reports that the family is readily available to assist as needed. No concerns regarding medications, hypertension, or diabetes management. They are assisting with medication preparation as needed. She is monitoring her blood sugar levels as recommended. Declined disease management needs. Stephanie [name] reports that Stephanie [name]' gait and balance are impaired due to the stroke but indicates she is doing well with her cane and walker. She does not feel that additional assistance is required. She agreed to notify the provider or call if Stephanie [name]' condition declines and care management outreach is needed.     PLAN Stephanie [name]' caregiver/daughter Stephanie [name] will call if her condition changes and outreach is needed. The care management team will gladly assist.    [facility] Care Management [facility] [phone]

## 2020-10-23 DIAGNOSIS — M8588 Other specified disorders of bone density and structure, other site: Secondary | ICD-10-CM | POA: Diagnosis not present

## 2020-10-23 LAB — HM DEXA SCAN

## 2020-11-26 ENCOUNTER — Encounter: Payer: Self-pay | Admitting: Emergency Medicine

## 2020-11-26 ENCOUNTER — Other Ambulatory Visit: Payer: Self-pay

## 2020-11-26 ENCOUNTER — Emergency Department: Payer: PPO

## 2020-11-26 ENCOUNTER — Emergency Department
Admission: EM | Admit: 2020-11-26 | Discharge: 2020-11-26 | Disposition: A | Discharge status: Home / Self Care | Payer: PPO | Attending: Emergency Medicine | Admitting: Emergency Medicine

## 2020-11-26 DIAGNOSIS — Z7901 Long term (current) use of anticoagulants: Secondary | ICD-10-CM | POA: Diagnosis not present

## 2020-11-26 DIAGNOSIS — F0390 Unspecified dementia without behavioral disturbance: Secondary | ICD-10-CM | POA: Insufficient documentation

## 2020-11-26 DIAGNOSIS — R059 Cough, unspecified: Secondary | ICD-10-CM | POA: Diagnosis present

## 2020-11-26 DIAGNOSIS — Z79899 Other long term (current) drug therapy: Secondary | ICD-10-CM | POA: Diagnosis not present

## 2020-11-26 DIAGNOSIS — R0600 Dyspnea, unspecified: Secondary | ICD-10-CM

## 2020-11-26 DIAGNOSIS — J441 Chronic obstructive pulmonary disease with (acute) exacerbation: Secondary | ICD-10-CM | POA: Insufficient documentation

## 2020-11-26 DIAGNOSIS — Z7951 Long term (current) use of inhaled steroids: Secondary | ICD-10-CM | POA: Diagnosis not present

## 2020-11-26 DIAGNOSIS — Z87891 Personal history of nicotine dependence: Secondary | ICD-10-CM | POA: Insufficient documentation

## 2020-11-26 DIAGNOSIS — I10 Essential (primary) hypertension: Secondary | ICD-10-CM | POA: Insufficient documentation

## 2020-11-26 DIAGNOSIS — I2510 Atherosclerotic heart disease of native coronary artery without angina pectoris: Secondary | ICD-10-CM | POA: Diagnosis not present

## 2020-11-26 DIAGNOSIS — R0602 Shortness of breath: Secondary | ICD-10-CM | POA: Diagnosis not present

## 2020-11-26 LAB — BASIC METABOLIC PANEL WITH GFR
Anion gap: 8 (ref 5–15)
BUN: 41 mg/dL — ABNORMAL HIGH (ref 8–23)
CO2: 25 mmol/L (ref 22–32)
Calcium: 10 mg/dL (ref 8.9–10.3)
Chloride: 102 mmol/L (ref 98–111)
Creatinine, Ser: 1.92 mg/dL — ABNORMAL HIGH (ref 0.44–1.00)
GFR, Estimated: 26 mL/min — ABNORMAL LOW (ref >60)
Glucose, Bld: 88 mg/dL (ref 70–99)
Potassium: 5.1 mmol/L (ref 3.5–5.1)
Sodium: 135 mmol/L (ref 135–145)

## 2020-11-26 LAB — CBC
HCT: 32.6 % — ABNORMAL LOW (ref 36.0–46.0)
Hemoglobin: 10.6 g/dL — ABNORMAL LOW (ref 12.0–15.0)
MCH: 31.1 pg (ref 26.0–34.0)
MCHC: 32.5 g/dL (ref 30.0–36.0)
MCV: 95.6 fL (ref 80.0–100.0)
Platelets: 249 K/uL (ref 150–400)
RBC: 3.41 MIL/uL — ABNORMAL LOW (ref 3.87–5.11)
RDW: 14.2 % (ref 11.5–15.5)
WBC: 8.1 K/uL (ref 4.0–10.5)
nRBC: 0 % (ref 0.0–0.2)

## 2020-11-26 LAB — TROPONIN I (HIGH SENSITIVITY): Troponin I (High Sensitivity): 10 ng/L (ref <18)

## 2020-11-26 MED ORDER — METHYLPREDNISOLONE SODIUM SUCC 125 MG IJ SOLR
125.0000 mg | Freq: Once | INTRAMUSCULAR | Status: DC
  Filled 2020-11-26: qty 2

## 2020-11-26 MED ORDER — IPRATROPIUM-ALBUTEROL 0.5-2.5 (3) MG/3ML IN SOLN
3.0000 mL | Freq: Once | RESPIRATORY_TRACT | Status: AC
  Administered 2020-11-26: 3 mL via RESPIRATORY_TRACT
  Filled 2020-11-26: qty 3

## 2020-11-26 MED ORDER — PREDNISONE 10 MG PO TABS: 10.0000 mg | ORAL_TABLET | Freq: Every day | ORAL | 0 refills | Status: DC

## 2020-11-26 MED ORDER — DEXAMETHASONE SODIUM PHOSPHATE 10 MG/ML IJ SOLN
10.0000 mg | Freq: Once | INTRAMUSCULAR | Status: AC
  Administered 2020-11-26: 10 mg via INTRAMUSCULAR
  Filled 2020-11-26: qty 1

## 2020-11-26 NOTE — ED Provider Notes (Signed)
[facility] Emergency Department Provider Note  Time seen: 7:48 PM  I have reviewed the triage vital signs and the nursing notes.   HISTORY  Chief Complaint Shortness of Breath   HPI Stephanie [name] is a 85 y.o. female with a past medical history arthritis, COPD, CAD, prior CVA, hypertension, presents to the emergency department for cough and mild chest discomfort.  According to the patient and daughter the patient choked while eating dinner last night.  States she was able to get up everything she was choking on and felt better however continued to have a mild cough overnight and then this morning noted continued cough but also some chest discomfort.  No fever.  Denies any significant shortness of breath currently.  Denies O2 use at home.  Patient currently satting 96% on room air.   Past Medical History:  Diagnosis Date  . Arthritis   . Baker's cyst of knee, right    a. 08/2016 U/S: Baker's cyst noted in R popliteal fossa.  . Complication of anesthesia   . COPD (chronic obstructive pulmonary disease) ([place])    a. Home O2 started 08/2017.  Marland Kitchen Coronary artery disease    a. prior h/o stenting ~ 2005.  Marland Kitchen Depression   . Edema   . History of cardiac monitoring    a. 08/2016 s/p LINQ placement following CVA. No AFib to date (09/2017).  Marland Kitchen History of pneumonia   . Hx of ischemic left MCA stroke    a. 08/2016 large posterior L MCA territory infarct in the setting of LLE DVT and suggestion of PFO on transcranial dopplers w/ bubble study (no PFO reported on echocardiogram)-->Eliquis.  . Hypertension   . Left leg DVT ([place])    a. 08/2016 U/S: subacute DVT in prox L popliteal vein w/ chronic superficial thrombosis in the L greater saphenous vein from the mid to prox L calf-->Eliquis.  Marland Kitchen PFO (patent foramen ovale)    a. 08/2016 Abnl TCD w/ bubble study suggesting large PFO. Not reported on echocardiogram.  . PONV (postoperative nausea and vomiting)     Patient Active  Problem List   Diagnosis Date Noted  . CAP (community acquired pneumonia) 09/15/2017  . Sepsis ([place]) 09/12/2017  . History of stroke 09/06/2017  . Essential hypertension 04/14/2017  . PFO (patent foramen ovale)   . DVT (deep venous thrombosis) ([place])   . Cerebrovascular accident (CVA) due to embolism of left middle cerebral artery ([place])   . Expressive aphasia   . Elevated troponin 08/18/2016  . Mild malnutrition ([place]) 08/18/2016  . Acute CVA (cerebrovascular accident) ([place]) 08/17/2016  . Acute exacerbation of chronic obstructive pulmonary disease (COPD) ([place]) 07/19/2016  . Depression 05/03/2016  . Mild dementia ([place]) 04/19/2016  . COPD, moderate ([place]) 03/05/2015  . Anxiety and depression 03/04/2015  . Arteriosclerosis of coronary artery 03/04/2015  . CAFL (chronic airflow limitation) ([place]) 03/04/2015  . Breathlessness on exertion 03/04/2015  . HLD (hyperlipidemia) 03/04/2015  . Osteopenia 03/04/2015  . Peptic ulcer 03/04/2015  . Amnesia 03/20/2014  . Disordered sleep 03/20/2014  . Allergy to environmental factors 11/14/2013  . Gastroduodenal ulcer 11/14/2013  . GI bleed 11/14/2013    Past Surgical History:  Procedure Laterality Date  . BALLOON ANGIOPLASTY, ARTERY  05/17/2009  . CATARACT EXTRACTION W/PHACO Left 05/24/2016   Procedure: CATARACT EXTRACTION PHACO AND INTRAOCULAR LENS PLACEMENT (IOC);  Surgeon: Birder Robson, MD;  Location: [facility] ORS;  Service: Ophthalmology;  Laterality: Left;  Lot# [id number] H [place]:   00:52.3 AP%:  27.3 CDE:  14.26   . CATARACT EXTRACTION W/PHACO Right 06/14/2016   Procedure: CATARACT EXTRACTION PHACO AND INTRAOCULAR LENS PLACEMENT (IOC);  Surgeon: Birder Robson, MD;  Location: [facility] ORS;  Service: Ophthalmology;  Laterality: Right;  Lot# [id number] [place]: 01:13.4 AP%: 24.4 CDE: 17.85  . CORONARY ANGIOPLASTY     STENT  . EP IMPLANTABLE DEVICE N/A 08/19/2016   Procedure: Loop Recorder Insertion;  Surgeon: Will Meredith Leeds, MD;  Location: [place] [facility];  Service: Cardiovascular;  Laterality: N/A;    Prior to Admission medications   Medication Sig Start Date End Date Taking? Authorizing Provider  acetaminophen (TYLENOL) 500 MG tablet Take 1,000 mg by mouth 2 (two) times daily.     [provider]  albuterol (PROAIR HFA) 108 (90 Base) MCG/ACT inhaler INHALE TWO PUFFS INTO LUNGS EVERY 6 HOURS AS NEEDED 09/02/19   Birdie Sons, MD  amLODipine (NORVASC) 5 MG tablet TAKE 1 TABLET BY MOUTH EVERY DAY 10/05/20   Chrismon, Vickki Muff, PA-C  apixaban (ELIQUIS) 2.5 MG TABS tablet Take 1 tablet (2.5 mg total) by mouth 2 (two) times daily. 05/12/20   Chrismon, Vickki Muff, PA-C  Calcium Carb-Cholecalciferol 600-800 MG-UNIT TABS Take 1 tablet by mouth daily.     [provider]  docusate sodium (COLACE) 250 MG capsule Take 100 mg by mouth at bedtime.     [provider]  gabapentin (NEURONTIN) 100 MG capsule TAKE 1 CAPSULE (100MG ) BY MOUTH TWICE DAILY 10/05/20   Chrismon, Vickki Muff, PA-C  loratadine (CLARITIN) 10 MG tablet Take 10 mg by mouth daily.    [provider]  montelukast (SINGULAIR) 10 MG tablet Take 10 mg by mouth at bedtime. 10/09/19   [provider]  Multiple Vitamin (MULTIVITAMIN) tablet Take 1 tablet by mouth daily.    [provider]  omeprazole (PRILOSEC) 20 MG capsule TAKE 1 CAPSULE BY MOUTH EVERY DAY 07/27/19   Chrismon, Vickki Muff, PA-C  PARoxetine (PAXIL) 10 MG tablet TAKE 1 TABLET (10MG ) IN THE MORNING AND 2 TABLETS (20MG ) AT BEDTIME 10/05/20   Chrismon, Vickki Muff, PA-C  Spacer/Aero Chamber Mouthpiece MISC Use spacer on inhalers for better administration. Patient not taking: Reported on 09/08/2020 10/06/16   Chrismon, Vickki Muff, PA-C  SYMBICORT 160-4.5 MCG/ACT inhaler INHALE TWO PUFFS INTO THE LUNGS TWICE A DAY 03/11/19   Chrismon, Simona Huh E, PA-C  tiotropium (SPIRIVA HANDIHALER) 18 MCG inhalation capsule Place 1 capsule (18 mcg total) into inhaler and inhale daily. One puff daily  09/02/19   Birdie Sons, MD  triamcinolone (KENALOG) 0.025 % cream w/ Cerave - APPLY 1 APPLICATION(S) TOPICAL DAILY FROM THE NECK DOWN Patient not taking: Reported on 09/08/2020 11/06/17   [provider]    Allergies  Allergen Reactions  . Duloxetine Hcl Other (See Comments)    Altered Mental Status  . Influenza Vaccines Swelling    At injection site  . Venlafaxine Nausea And Vomiting    Family History  Problem Relation Age of Onset  . Hypertension Mother   . Diabetes Mother   . Pancreatic cancer Mother 4  . Cancer Mother   . Suicidality Father 62  . Hypertension Father   . Alcohol abuse Father   . Hypertension Sister   . Heart disease Sister   . Kidney disease Sister   . Varicose Veins Sister   . Parkinson's disease Sister     Social History Social History   Tobacco Use  . Smoking status: Former Smoker  Types: Cigarettes    Quit date: 08/07/2001    Years since quitting: 19.3  . Smokeless tobacco: Never Used  Vaping Use  . Vaping Use: Never used  Substance Use Topics  . Alcohol use: No    Alcohol/week: 0.0 standard drinks  . Drug use: No    Review of Systems Constitutional: Negative for fever. Cardiovascular: Mild central chest discomfort. Respiratory: Mild shortness of breath overnight.  Positive for cough since yesterday. Gastrointestinal: Negative for abdominal pain, vomiting and diarrhea. Musculoskeletal: Negative for musculoskeletal complaints Skin: Negative for skin complaints  Neurological: Negative for headache All other ROS negative  ____________________________________________   PHYSICAL EXAM:  VITAL SIGNS: ED Triage Vitals  Enc Vitals Group     BP 11/26/20 1911 (!) 184/95     Pulse Rate 11/26/20 1911 89     Resp 11/26/20 1911 17     Temp 11/26/20 1911 98 F (36.7 C)     Temp Source 11/26/20 1911 Oral     SpO2 11/26/20 1910 96 %     Weight 11/26/20 1912 120 lb (54.4 kg)     Height 11/26/20 1912 5\' 2"  (1.575 m)     Head  Circumference --      Peak Flow --      Pain Score 11/26/20 1912 0     Pain Loc --      Pain Edu? --      Excl. in [place]? --     Constitutional: Alert and oriented. Well appearing and in no distress. Eyes: Normal exam ENT      Head: Normocephalic and atraumatic.      Mouth/Throat: Mucous membranes are moist. Cardiovascular: Normal rate, regular rhythm.  Respiratory: Normal respiratory effort without tachypnea nor retractions.  Mild to moderate expiratory wheeze bilaterally. Gastrointestinal: Soft and nontender. No distention.  Musculoskeletal: Nontender with normal range of motion in all extremities. No lower extremity tenderness Neurologic:  Normal speech and language. No gross focal neurologic deficits  Skin:  Skin is warm, dry and intact.  Psychiatric: Mood and affect are normal.   ____________________________________________    EKG  EKG viewed and interpreted by myself shows a normal sinus rhythm at 89 bpm with a narrow QRS, normal axis, normal intervals, no concerning ST changes.  ____________________________________________    RADIOLOGY  Chest x-ray negative  ____________________________________________   INITIAL IMPRESSION / ASSESSMENT AND PLAN / ED COURSE  Pertinent labs & imaging results that were available during my care of the patient were reviewed by me and considered in my medical decision making (see chart for details).   Patient presents to the emergency department for choking last night with cough and now shortness of breath and mild chest discomfort.  Concern for possible aspiration, pneumonia, COPD exacerbation.  Patient's lab work showed no significant abnormalities.  Chronic kidney disease but not significantly worse from past values.  Patient's chest x-ray is negative no signs of aspiration or pneumonia.  Troponin is negative.  Overall the patient appears well satting 100% on room air.  Given the patient's expiratory wheeze on examination history of COPD  as well as complaint shortness of breath today we will dose IM steroids as well as DuoNeb's.  Patient is feeling much better.  I believe the patient would be safe for discharge home with a prescription for steroids.  Patient will follow up with her doctor.  Ben Kelbi Renstrom was evaluated in Emergency Department on 11/26/2020 for the symptoms described in the history of present illness. She was  evaluated in the context of the global COVID-19 pandemic, which necessitated consideration that the patient might be at risk for infection with the SARS-CoV-2 virus that causes COVID-19. Institutional protocols and algorithms that pertain to the evaluation of patients at risk for COVID-19 are in a state of rapid change based on information released by regulatory bodies including the CDC and federal and state organizations. These policies and algorithms were followed during the patient's care in the ED.  ____________________________________________   FINAL CLINICAL IMPRESSION(S) / ED DIAGNOSES  Dyspnea COPD exacerbation   Harvest Dark, MD 11/26/20 2129

## 2020-11-26 NOTE — ED Triage Notes (Signed)
Pt in via POV, reports worsening cough, shortness of breath since yesterday.  Also reports not being able to sleep well last night due to cough w/ an episode of chest pain.  Hx of COPD.  NAD noted at this time.

## 2020-11-27 ENCOUNTER — Telehealth: Payer: PPO | Admitting: Family Medicine

## 2020-11-27 ENCOUNTER — Telehealth: Payer: Self-pay

## 2020-11-27 NOTE — Telephone Encounter (Signed)
Are we able to disclose medical information?KW

## 2020-11-27 NOTE — Telephone Encounter (Signed)
Copied from [place] [id number]. Topic: General - Inquiry >> Nov 27, 2020  4:24 PM Alanda Slim E wrote: Reason for CRM: [facility] has been trying to reach pt but is not able to and stated that when they call someone hangs up / they were contracted by insurance to help care for pt / they are asking if there is a relative or care giver for this pt that they can reach since she has dementia / they are trying to get her services started / please advise

## 2020-12-04 DIAGNOSIS — R0600 Dyspnea, unspecified: Secondary | ICD-10-CM | POA: Diagnosis not present

## 2020-12-04 DIAGNOSIS — J310 Chronic rhinitis: Secondary | ICD-10-CM | POA: Diagnosis not present

## 2020-12-04 DIAGNOSIS — J439 Emphysema, unspecified: Secondary | ICD-10-CM | POA: Diagnosis not present

## 2020-12-08 NOTE — Telephone Encounter (Signed)
If they send a fax requesting demographics due to continuity of care but we don't give PHI over the phone. TNP

## 2021-01-07 DIAGNOSIS — R609 Edema, unspecified: Secondary | ICD-10-CM | POA: Diagnosis not present

## 2021-01-07 DIAGNOSIS — D631 Anemia in chronic kidney disease: Secondary | ICD-10-CM | POA: Diagnosis not present

## 2021-01-07 DIAGNOSIS — N1832 Chronic kidney disease, stage 3b: Secondary | ICD-10-CM | POA: Diagnosis not present

## 2021-01-07 DIAGNOSIS — I10 Essential (primary) hypertension: Secondary | ICD-10-CM | POA: Diagnosis not present

## 2021-01-07 DIAGNOSIS — R809 Proteinuria, unspecified: Secondary | ICD-10-CM | POA: Diagnosis not present

## 2021-01-10 ENCOUNTER — Other Ambulatory Visit: Payer: Self-pay | Admitting: Family Medicine

## 2021-01-10 DIAGNOSIS — I63412 Cerebral infarction due to embolism of left middle cerebral artery: Secondary | ICD-10-CM

## 2021-01-10 DIAGNOSIS — R4701 Aphasia: Secondary | ICD-10-CM

## 2021-01-10 NOTE — Telephone Encounter (Signed)
Requested medication (s) are due for refill today: yes to all 3   Requested medication (s) are on the active medication list: yes  Last refill:  10/05/20 for all 3 medications  Future visit scheduled: no  Notes to clinic:  Called and spoke to pt's daughter who stated that she will ask her nephrologist on Wednesday at her scheduled appointment. Advised daughter to call and make appt if nephrologist unwilling to refill. Daughter verbalized understanding.     Requested Prescriptions  Pending Prescriptions Disp Refills   amLODipine (NORVASC) 5 MG tablet [Pharmacy Med Name: AMLODIPINE BESYLATE 5 MG TAB] 90 tablet 0    Sig: TAKE 1 TABLET BY MOUTH EVERY DAY      Cardiovascular:  Calcium Channel Blockers Failed - 01/10/2021  5:16 PM      Failed - Last BP in normal range    BP Readings from Last 1 Encounters:  11/26/20 (!) 150/76          Failed - Valid encounter within last 6 months    Recent Outpatient Visits           8 months ago Easy bruising   [place], PA-C   1 year ago Muscle cramps   [facility], Vickki Muff, PA-C   1 year ago Expressive aphasia   [facility], Vickki Muff, PA-C   2 years ago Expressive aphasia   [facility], Vickki Muff, PA-C   2 years ago Recurrent dry cough   [facility], Vickki Muff, PA-C                  PARoxetine (PAXIL) 10 MG tablet [Pharmacy Med Name: PAROXETINE HCL 10 MG TABLET] 270 tablet 0    Sig: TAKE 1 TABLET (10MG ) IN THE MORNING AND 2 TABLETS (20MG ) AT BEDTIME      Psychiatry:  Antidepressants - SSRI Failed - 01/10/2021  5:16 PM      Failed - Valid encounter within last 6 months    Recent Outpatient Visits           8 months ago Easy bruising   [place], PA-C   1 year ago Muscle cramps   [facility], Vickki Muff, PA-C   1 year ago Expressive aphasia    [facility], Vickki Muff, PA-C   2 years ago Expressive aphasia   [facility], Vickki Muff, PA-C   2 years ago Recurrent dry cough   [facility], Vickki Muff, PA-C                Passed - Completed PHQ-2 or PHQ-9 in the last 360 days        gabapentin (NEURONTIN) 100 MG capsule [Pharmacy Med Name: GABAPENTIN 100 MG CAPSULE] 180 capsule 0    Sig: TAKE 1 CAPSULE (100MG ) BY MOUTH TWICE DAILY      Neurology: Anticonvulsants - gabapentin Passed - 01/10/2021  5:16 PM      Passed - Valid encounter within last 12 months    Recent Outpatient Visits           8 months ago Easy bruising   [place], PA-C   1 year ago Muscle cramps   [facility], Vickki Muff, PA-C   1 year ago Expressive aphasia   [facility], Vickki Muff, PA-C   2 years  ago Expressive aphasia   [place], PA-C   2 years ago Recurrent dry cough   [place], [place]

## 2021-01-13 DIAGNOSIS — D631 Anemia in chronic kidney disease: Secondary | ICD-10-CM | POA: Diagnosis not present

## 2021-01-13 DIAGNOSIS — I10 Essential (primary) hypertension: Secondary | ICD-10-CM | POA: Diagnosis not present

## 2021-01-13 DIAGNOSIS — N184 Chronic kidney disease, stage 4 (severe): Secondary | ICD-10-CM | POA: Diagnosis not present

## 2021-01-13 DIAGNOSIS — R609 Edema, unspecified: Secondary | ICD-10-CM | POA: Diagnosis not present

## 2021-03-05 ENCOUNTER — Telehealth: Payer: Self-pay | Admitting: Family Medicine

## 2021-03-05 NOTE — Telephone Encounter (Signed)
Copied from [place] [id number]. Topic: General - Other >> Mar 05, 2021  2:19 PM Leward Quan A wrote: Reason for CRM: Arville Care NP  with [facility] called in to inform Vernie Murders that she met with patient this morning for an evaluation and discovered that she have  Edema of left leg not resolved with elevation wondering if Simona Huh would order a routine ultrasound of that leg since patient have a history of blood clots in that leg. Please advise and call Olivia Mackie at  Ph# [phone]

## 2021-03-05 NOTE — Telephone Encounter (Signed)
Please advise. Thanks.  

## 2021-03-07 NOTE — Telephone Encounter (Signed)
Please order vascular ultrasound of left leg to rule out DVT.

## 2021-03-08 ENCOUNTER — Other Ambulatory Visit: Payer: Self-pay

## 2021-03-08 DIAGNOSIS — R600 Localized edema: Secondary | ICD-10-CM

## 2021-03-08 NOTE — Telephone Encounter (Signed)
Done

## 2021-03-17 ENCOUNTER — Other Ambulatory Visit: Payer: Self-pay

## 2021-03-17 ENCOUNTER — Ambulatory Visit (INDEPENDENT_AMBULATORY_CARE_PROVIDER_SITE_OTHER): Payer: PPO

## 2021-03-17 DIAGNOSIS — R600 Localized edema: Secondary | ICD-10-CM

## 2021-04-06 ENCOUNTER — Other Ambulatory Visit: Payer: Self-pay | Admitting: Family Medicine

## 2021-04-06 NOTE — Telephone Encounter (Signed)
Requested medications are due for refill today.  yes  Requested medications are on the active medications list.  yes  Last refill. 01/11/2021  Future visit scheduled.   no  Notes to clinic.  Patient is more that 3 months overdue for office visit.

## 2021-04-07 DIAGNOSIS — I10 Essential (primary) hypertension: Secondary | ICD-10-CM | POA: Diagnosis not present

## 2021-04-07 DIAGNOSIS — Z23 Encounter for immunization: Secondary | ICD-10-CM | POA: Diagnosis not present

## 2021-04-07 DIAGNOSIS — E785 Hyperlipidemia, unspecified: Secondary | ICD-10-CM | POA: Diagnosis not present

## 2021-04-07 DIAGNOSIS — F32A Depression, unspecified: Secondary | ICD-10-CM | POA: Diagnosis not present

## 2021-04-07 DIAGNOSIS — D692 Other nonthrombocytopenic purpura: Secondary | ICD-10-CM | POA: Diagnosis not present

## 2021-04-07 DIAGNOSIS — S61412A Laceration without foreign body of left hand, initial encounter: Secondary | ICD-10-CM | POA: Diagnosis not present

## 2021-04-07 DIAGNOSIS — Z8673 Personal history of transient ischemic attack (TIA), and cerebral infarction without residual deficits: Secondary | ICD-10-CM | POA: Diagnosis not present

## 2021-04-07 DIAGNOSIS — J439 Emphysema, unspecified: Secondary | ICD-10-CM | POA: Diagnosis not present

## 2021-04-07 DIAGNOSIS — N1832 Chronic kidney disease, stage 3b: Secondary | ICD-10-CM | POA: Diagnosis not present

## 2021-04-07 DIAGNOSIS — F0390 Unspecified dementia without behavioral disturbance: Secondary | ICD-10-CM | POA: Diagnosis not present

## 2021-04-07 DIAGNOSIS — F419 Anxiety disorder, unspecified: Secondary | ICD-10-CM | POA: Diagnosis not present

## 2021-04-07 DIAGNOSIS — I2510 Atherosclerotic heart disease of native coronary artery without angina pectoris: Secondary | ICD-10-CM | POA: Diagnosis not present

## 2021-05-01 ENCOUNTER — Other Ambulatory Visit: Payer: Self-pay | Admitting: Family Medicine

## 2021-06-21 DIAGNOSIS — D2262 Melanocytic nevi of left upper limb, including shoulder: Secondary | ICD-10-CM | POA: Diagnosis not present

## 2021-06-21 DIAGNOSIS — L821 Other seborrheic keratosis: Secondary | ICD-10-CM | POA: Diagnosis not present

## 2021-06-21 DIAGNOSIS — D692 Other nonthrombocytopenic purpura: Secondary | ICD-10-CM | POA: Diagnosis not present

## 2021-06-21 DIAGNOSIS — D225 Melanocytic nevi of trunk: Secondary | ICD-10-CM | POA: Diagnosis not present

## 2021-06-21 DIAGNOSIS — D2272 Melanocytic nevi of left lower limb, including hip: Secondary | ICD-10-CM | POA: Diagnosis not present

## 2021-06-21 DIAGNOSIS — D2271 Melanocytic nevi of right lower limb, including hip: Secondary | ICD-10-CM | POA: Diagnosis not present

## 2021-06-21 DIAGNOSIS — D2261 Melanocytic nevi of right upper limb, including shoulder: Secondary | ICD-10-CM | POA: Diagnosis not present

## 2021-06-23 DIAGNOSIS — Z8673 Personal history of transient ischemic attack (TIA), and cerebral infarction without residual deficits: Secondary | ICD-10-CM | POA: Diagnosis not present

## 2021-06-23 DIAGNOSIS — F0390 Unspecified dementia without behavioral disturbance: Secondary | ICD-10-CM | POA: Diagnosis not present

## 2021-06-23 DIAGNOSIS — Z86718 Personal history of other venous thrombosis and embolism: Secondary | ICD-10-CM | POA: Diagnosis not present

## 2021-06-23 DIAGNOSIS — Z66 Do not resuscitate: Secondary | ICD-10-CM | POA: Diagnosis not present

## 2021-06-23 DIAGNOSIS — I739 Peripheral vascular disease, unspecified: Secondary | ICD-10-CM | POA: Diagnosis not present

## 2021-07-08 DIAGNOSIS — Z8669 Personal history of other diseases of the nervous system and sense organs: Secondary | ICD-10-CM | POA: Diagnosis not present

## 2021-07-08 DIAGNOSIS — Z09 Encounter for follow-up examination after completed treatment for conditions other than malignant neoplasm: Secondary | ICD-10-CM | POA: Diagnosis not present

## 2021-07-13 DIAGNOSIS — Z8669 Personal history of other diseases of the nervous system and sense organs: Secondary | ICD-10-CM | POA: Diagnosis not present

## 2021-07-13 DIAGNOSIS — Z09 Encounter for follow-up examination after completed treatment for conditions other than malignant neoplasm: Secondary | ICD-10-CM | POA: Diagnosis not present

## 2021-10-01 DIAGNOSIS — N1832 Chronic kidney disease, stage 3b: Secondary | ICD-10-CM | POA: Diagnosis not present

## 2021-10-01 DIAGNOSIS — E785 Hyperlipidemia, unspecified: Secondary | ICD-10-CM | POA: Diagnosis not present

## 2021-10-02 DIAGNOSIS — H109 Unspecified conjunctivitis: Secondary | ICD-10-CM | POA: Diagnosis not present

## 2021-10-03 DIAGNOSIS — H109 Unspecified conjunctivitis: Secondary | ICD-10-CM | POA: Diagnosis not present

## 2021-10-05 DIAGNOSIS — K219 Gastro-esophageal reflux disease without esophagitis: Secondary | ICD-10-CM | POA: Diagnosis not present

## 2021-10-05 DIAGNOSIS — I2510 Atherosclerotic heart disease of native coronary artery without angina pectoris: Secondary | ICD-10-CM | POA: Diagnosis not present

## 2021-10-05 DIAGNOSIS — I10 Essential (primary) hypertension: Secondary | ICD-10-CM | POA: Diagnosis not present

## 2021-10-05 DIAGNOSIS — N1832 Chronic kidney disease, stage 3b: Secondary | ICD-10-CM | POA: Diagnosis not present

## 2021-10-05 DIAGNOSIS — D692 Other nonthrombocytopenic purpura: Secondary | ICD-10-CM | POA: Diagnosis not present

## 2021-10-05 DIAGNOSIS — E785 Hyperlipidemia, unspecified: Secondary | ICD-10-CM | POA: Diagnosis not present

## 2021-10-05 DIAGNOSIS — Z0000 Encounter for general adult medical examination without abnormal findings: Secondary | ICD-10-CM | POA: Diagnosis not present

## 2021-10-05 DIAGNOSIS — J439 Emphysema, unspecified: Secondary | ICD-10-CM | POA: Diagnosis not present

## 2021-10-05 DIAGNOSIS — R0609 Other forms of dyspnea: Secondary | ICD-10-CM | POA: Diagnosis not present

## 2021-10-05 DIAGNOSIS — Z8673 Personal history of transient ischemic attack (TIA), and cerebral infarction without residual deficits: Secondary | ICD-10-CM | POA: Diagnosis not present

## 2021-10-11 DIAGNOSIS — Z8669 Personal history of other diseases of the nervous system and sense organs: Secondary | ICD-10-CM | POA: Diagnosis not present

## 2021-10-11 DIAGNOSIS — Z09 Encounter for follow-up examination after completed treatment for conditions other than malignant neoplasm: Secondary | ICD-10-CM | POA: Diagnosis not present

## 2021-10-21 DIAGNOSIS — F0390 Unspecified dementia without behavioral disturbance: Secondary | ICD-10-CM | POA: Diagnosis not present

## 2021-10-21 DIAGNOSIS — J449 Chronic obstructive pulmonary disease, unspecified: Secondary | ICD-10-CM | POA: Diagnosis not present

## 2021-10-21 DIAGNOSIS — N184 Chronic kidney disease, stage 4 (severe): Secondary | ICD-10-CM | POA: Diagnosis not present

## 2021-10-21 DIAGNOSIS — Z86718 Personal history of other venous thrombosis and embolism: Secondary | ICD-10-CM | POA: Diagnosis not present

## 2021-10-21 DIAGNOSIS — Z7951 Long term (current) use of inhaled steroids: Secondary | ICD-10-CM | POA: Diagnosis not present

## 2021-10-21 DIAGNOSIS — I739 Peripheral vascular disease, unspecified: Secondary | ICD-10-CM | POA: Diagnosis not present

## 2021-10-21 DIAGNOSIS — D692 Other nonthrombocytopenic purpura: Secondary | ICD-10-CM | POA: Diagnosis not present

## 2021-10-22 DIAGNOSIS — J441 Chronic obstructive pulmonary disease with (acute) exacerbation: Secondary | ICD-10-CM | POA: Diagnosis not present

## 2021-10-22 DIAGNOSIS — N390 Urinary tract infection, site not specified: Secondary | ICD-10-CM | POA: Diagnosis not present

## 2021-11-01 DIAGNOSIS — J449 Chronic obstructive pulmonary disease, unspecified: Secondary | ICD-10-CM | POA: Diagnosis not present

## 2021-11-01 DIAGNOSIS — Z87440 Personal history of urinary (tract) infections: Secondary | ICD-10-CM | POA: Diagnosis not present

## 2021-12-24 DIAGNOSIS — I69398 Other sequelae of cerebral infarction: Secondary | ICD-10-CM | POA: Diagnosis not present

## 2021-12-24 DIAGNOSIS — H26493 Other secondary cataract, bilateral: Secondary | ICD-10-CM | POA: Diagnosis not present

## 2021-12-24 DIAGNOSIS — M3501 Sicca syndrome with keratoconjunctivitis: Secondary | ICD-10-CM | POA: Diagnosis not present

## 2021-12-24 DIAGNOSIS — H43813 Vitreous degeneration, bilateral: Secondary | ICD-10-CM | POA: Diagnosis not present

## 2022-02-18 DIAGNOSIS — H26492 Other secondary cataract, left eye: Secondary | ICD-10-CM | POA: Diagnosis not present

## 2022-03-07 DIAGNOSIS — J449 Chronic obstructive pulmonary disease, unspecified: Secondary | ICD-10-CM | POA: Diagnosis not present

## 2022-03-08 DIAGNOSIS — J441 Chronic obstructive pulmonary disease with (acute) exacerbation: Secondary | ICD-10-CM | POA: Diagnosis not present

## 2022-03-10 DIAGNOSIS — J439 Emphysema, unspecified: Secondary | ICD-10-CM | POA: Diagnosis not present

## 2022-03-10 DIAGNOSIS — R0609 Other forms of dyspnea: Secondary | ICD-10-CM | POA: Diagnosis not present

## 2022-03-10 DIAGNOSIS — R0902 Hypoxemia: Secondary | ICD-10-CM | POA: Diagnosis not present

## 2022-03-14 ENCOUNTER — Other Ambulatory Visit: Payer: Self-pay

## 2022-03-14 ENCOUNTER — Inpatient Hospital Stay
Admission: EM | Admit: 2022-03-14 | Discharge: 2022-03-17 | DRG: 177 | Disposition: A | Discharge status: Home Health | Payer: PPO | Attending: Osteopathic Medicine | Admitting: Osteopathic Medicine

## 2022-03-14 ENCOUNTER — Encounter: Payer: Self-pay | Admitting: Emergency Medicine

## 2022-03-14 ENCOUNTER — Emergency Department: Payer: PPO

## 2022-03-14 DIAGNOSIS — J189 Pneumonia, unspecified organism: Secondary | ICD-10-CM

## 2022-03-14 DIAGNOSIS — J441 Chronic obstructive pulmonary disease with (acute) exacerbation: Secondary | ICD-10-CM | POA: Diagnosis present

## 2022-03-14 DIAGNOSIS — Z8249 Family history of ischemic heart disease and other diseases of the circulatory system: Secondary | ICD-10-CM

## 2022-03-14 DIAGNOSIS — Z7901 Long term (current) use of anticoagulants: Secondary | ICD-10-CM | POA: Diagnosis not present

## 2022-03-14 DIAGNOSIS — J440 Chronic obstructive pulmonary disease with acute lower respiratory infection: Secondary | ICD-10-CM | POA: Diagnosis present

## 2022-03-14 DIAGNOSIS — Z66 Do not resuscitate: Secondary | ICD-10-CM | POA: Diagnosis present

## 2022-03-14 DIAGNOSIS — I959 Hypotension, unspecified: Secondary | ICD-10-CM | POA: Diagnosis not present

## 2022-03-14 DIAGNOSIS — J9621 Acute and chronic respiratory failure with hypoxia: Secondary | ICD-10-CM | POA: Diagnosis present

## 2022-03-14 DIAGNOSIS — F32A Depression, unspecified: Secondary | ICD-10-CM | POA: Diagnosis not present

## 2022-03-14 DIAGNOSIS — Z79899 Other long term (current) drug therapy: Secondary | ICD-10-CM

## 2022-03-14 DIAGNOSIS — Z8673 Personal history of transient ischemic attack (TIA), and cerebral infarction without residual deficits: Secondary | ICD-10-CM | POA: Diagnosis not present

## 2022-03-14 DIAGNOSIS — Z7951 Long term (current) use of inhaled steroids: Secondary | ICD-10-CM

## 2022-03-14 DIAGNOSIS — I2510 Atherosclerotic heart disease of native coronary artery without angina pectoris: Secondary | ICD-10-CM | POA: Diagnosis not present

## 2022-03-14 DIAGNOSIS — Z87891 Personal history of nicotine dependence: Secondary | ICD-10-CM | POA: Diagnosis not present

## 2022-03-14 DIAGNOSIS — I129 Hypertensive chronic kidney disease with stage 1 through stage 4 chronic kidney disease, or unspecified chronic kidney disease: Secondary | ICD-10-CM | POA: Diagnosis present

## 2022-03-14 DIAGNOSIS — I82409 Acute embolism and thrombosis of unspecified deep veins of unspecified lower extremity: Secondary | ICD-10-CM | POA: Diagnosis present

## 2022-03-14 DIAGNOSIS — T380X5A Adverse effect of glucocorticoids and synthetic analogues, initial encounter: Secondary | ICD-10-CM | POA: Diagnosis present

## 2022-03-14 DIAGNOSIS — Z888 Allergy status to other drugs, medicaments and biological substances status: Secondary | ICD-10-CM

## 2022-03-14 DIAGNOSIS — R42 Dizziness and giddiness: Secondary | ICD-10-CM | POA: Diagnosis not present

## 2022-03-14 DIAGNOSIS — Z955 Presence of coronary angioplasty implant and graft: Secondary | ICD-10-CM | POA: Diagnosis not present

## 2022-03-14 DIAGNOSIS — Z8701 Personal history of pneumonia (recurrent): Secondary | ICD-10-CM

## 2022-03-14 DIAGNOSIS — J449 Chronic obstructive pulmonary disease, unspecified: Secondary | ICD-10-CM | POA: Diagnosis not present

## 2022-03-14 DIAGNOSIS — J690 Pneumonitis due to inhalation of food and vomit: Secondary | ICD-10-CM | POA: Diagnosis not present

## 2022-03-14 DIAGNOSIS — N1832 Chronic kidney disease, stage 3b: Secondary | ICD-10-CM | POA: Diagnosis not present

## 2022-03-14 DIAGNOSIS — J9601 Acute respiratory failure with hypoxia: Secondary | ICD-10-CM | POA: Diagnosis not present

## 2022-03-14 DIAGNOSIS — J168 Pneumonia due to other specified infectious organisms: Secondary | ICD-10-CM | POA: Diagnosis not present

## 2022-03-14 DIAGNOSIS — Z841 Family history of disorders of kidney and ureter: Secondary | ICD-10-CM | POA: Diagnosis not present

## 2022-03-14 DIAGNOSIS — R0602 Shortness of breath: Secondary | ICD-10-CM | POA: Diagnosis not present

## 2022-03-14 DIAGNOSIS — N183 Chronic kidney disease, stage 3 unspecified: Secondary | ICD-10-CM | POA: Diagnosis present

## 2022-03-14 DIAGNOSIS — R0902 Hypoxemia: Secondary | ICD-10-CM | POA: Diagnosis not present

## 2022-03-14 DIAGNOSIS — J181 Lobar pneumonia, unspecified organism: Secondary | ICD-10-CM | POA: Diagnosis not present

## 2022-03-14 DIAGNOSIS — R062 Wheezing: Secondary | ICD-10-CM | POA: Diagnosis not present

## 2022-03-14 DIAGNOSIS — Z20822 Contact with and (suspected) exposure to covid-19: Secondary | ICD-10-CM | POA: Diagnosis not present

## 2022-03-14 DIAGNOSIS — Z86718 Personal history of other venous thrombosis and embolism: Secondary | ICD-10-CM

## 2022-03-14 LAB — COMPREHENSIVE METABOLIC PANEL
ALT: 14 U/L (ref 0–44)
AST: 15 U/L (ref 15–41)
Albumin: 3.2 g/dL — ABNORMAL LOW (ref 3.5–5.0)
Alkaline Phosphatase: 80 U/L (ref 38–126)
Anion gap: 10 (ref 5–15)
BUN: 29 mg/dL — ABNORMAL HIGH (ref 8–23)
CO2: 22 mmol/L (ref 22–32)
Calcium: 8.8 mg/dL — ABNORMAL LOW (ref 8.9–10.3)
Chloride: 103 mmol/L (ref 98–111)
Creatinine, Ser: 1.54 mg/dL — ABNORMAL HIGH (ref 0.44–1.00)
GFR, Estimated: 33 mL/min — ABNORMAL LOW (ref >60)
Glucose, Bld: 148 mg/dL — ABNORMAL HIGH (ref 70–99)
Potassium: 3.9 mmol/L (ref 3.5–5.1)
Sodium: 135 mmol/L (ref 135–145)
Total Bilirubin: 0.5 mg/dL (ref 0.3–1.2)
Total Protein: 6.7 g/dL (ref 6.5–8.1)

## 2022-03-14 LAB — CBC WITH DIFFERENTIAL/PLATELET
Abs Immature Granulocytes: 0.37 10*3/uL — ABNORMAL HIGH (ref 0.00–0.07)
Basophils Absolute: 0 10*3/uL (ref 0.0–0.1)
Basophils Relative: 0 %
Eosinophils Absolute: 0 10*3/uL (ref 0.0–0.5)
Eosinophils Relative: 0 %
HCT: 31.1 % — ABNORMAL LOW (ref 36.0–46.0)
Hemoglobin: 10 g/dL — ABNORMAL LOW (ref 12.0–15.0)
Immature Granulocytes: 2 %
Lymphocytes Relative: 7 %
Lymphs Abs: 1.2 10*3/uL (ref 0.7–4.0)
MCH: 30.1 pg (ref 26.0–34.0)
MCHC: 32.2 g/dL (ref 30.0–36.0)
MCV: 93.7 fL (ref 80.0–100.0)
Monocytes Absolute: 1.1 10*3/uL — ABNORMAL HIGH (ref 0.1–1.0)
Monocytes Relative: 7 %
Neutro Abs: 13.4 10*3/uL — ABNORMAL HIGH (ref 1.7–7.7)
Neutrophils Relative %: 84 %
Platelets: 310 10*3/uL (ref 150–400)
RBC: 3.32 MIL/uL — ABNORMAL LOW (ref 3.87–5.11)
RDW: 14.4 % (ref 11.5–15.5)
WBC: 16.1 10*3/uL — ABNORMAL HIGH (ref 4.0–10.5)
nRBC: 0 % (ref 0.0–0.2)

## 2022-03-14 LAB — LACTIC ACID, PLASMA
Lactic Acid, Venous: 1.9 mmol/L (ref 0.5–1.9)
Lactic Acid, Venous: 2.8 mmol/L (ref 0.5–1.9)

## 2022-03-14 LAB — TROPONIN I (HIGH SENSITIVITY)
Troponin I (High Sensitivity): 24 ng/L — ABNORMAL HIGH (ref <18)
Troponin I (High Sensitivity): 22 ng/L — ABNORMAL HIGH (ref <18)

## 2022-03-14 LAB — SARS CORONAVIRUS 2 BY RT PCR: SARS Coronavirus 2 by RT PCR: NEGATIVE

## 2022-03-14 MED ORDER — LACTATED RINGERS IV BOLUS
1000.0000 mL | Freq: Once | INTRAVENOUS | Status: AC
Start: 2022-03-14 — End: 2022-03-16
  Administered 2022-03-14: 1000 mL via INTRAVENOUS

## 2022-03-14 MED ORDER — SODIUM CHLORIDE 0.9 % IV SOLN
3.0000 g | Freq: Two times a day (BID) | INTRAVENOUS | Status: DC
  Administered 2022-03-14 – 2022-03-17 (×6): 3 g via INTRAVENOUS
  Filled 2022-03-14 (×3): qty 3
  Filled 2022-03-14 (×2): qty 8
  Filled 2022-03-14: qty 3
  Filled 2022-03-14: qty 8

## 2022-03-14 MED ORDER — METHYLPREDNISOLONE SODIUM SUCC 125 MG IJ SOLR
125.0000 mg | Freq: Once | INTRAMUSCULAR | Status: AC
  Administered 2022-03-14: 125 mg via INTRAVENOUS
  Filled 2022-03-14: qty 2

## 2022-03-14 MED ORDER — GABAPENTIN 100 MG PO CAPS
100.0000 mg | ORAL_CAPSULE | Freq: Two times a day (BID) | ORAL | Status: DC
  Administered 2022-03-14 – 2022-03-17 (×6): 100 mg via ORAL
  Filled 2022-03-14 (×6): qty 1

## 2022-03-14 MED ORDER — ONDANSETRON HCL 4 MG PO TABS: 4.0000 mg | ORAL_TABLET | Freq: Four times a day (QID) | ORAL | Status: DC | PRN

## 2022-03-14 MED ORDER — LACTATED RINGERS IV SOLN: INTRAVENOUS | Status: AC

## 2022-03-14 MED ORDER — ADULT MULTIVITAMIN W/MINERALS CH
1.0000 | ORAL_TABLET | Freq: Every day | ORAL | Status: DC
  Administered 2022-03-15 – 2022-03-17 (×3): 1 via ORAL
  Filled 2022-03-14 (×3): qty 1

## 2022-03-14 MED ORDER — APIXABAN 2.5 MG PO TABS
2.5000 mg | ORAL_TABLET | Freq: Two times a day (BID) | ORAL | Status: DC
  Administered 2022-03-14 – 2022-03-17 (×6): 2.5 mg via ORAL
  Filled 2022-03-14 (×6): qty 1

## 2022-03-14 MED ORDER — BUDESONIDE 0.5 MG/2ML IN SUSP
2.0000 mg | Freq: Two times a day (BID) | RESPIRATORY_TRACT | Status: DC
  Administered 2022-03-14: 0.5 mg via RESPIRATORY_TRACT
  Administered 2022-03-15: 2 mg via RESPIRATORY_TRACT
  Filled 2022-03-14 (×2): qty 8

## 2022-03-14 MED ORDER — LACTATED RINGERS IV SOLN
INTRAVENOUS | Status: DC
Start: 2022-03-14 — End: 2022-03-14

## 2022-03-14 MED ORDER — STERILE WATER FOR INJECTION IJ SOLN
INTRAMUSCULAR | Status: AC
  Filled 2022-03-14: qty 10

## 2022-03-14 MED ORDER — ALBUTEROL SULFATE (2.5 MG/3ML) 0.083% IN NEBU
2.5000 mg | INHALATION_SOLUTION | RESPIRATORY_TRACT | Status: DC | PRN
  Administered 2022-03-15: 2.5 mg via RESPIRATORY_TRACT
  Filled 2022-03-14: qty 3

## 2022-03-14 MED ORDER — MONTELUKAST SODIUM 10 MG PO TABS
10.0000 mg | ORAL_TABLET | Freq: Every day | ORAL | Status: DC
Start: 2022-03-14 — End: 2022-03-17
  Administered 2022-03-14 – 2022-03-16 (×3): 10 mg via ORAL
  Filled 2022-03-14 (×3): qty 1

## 2022-03-14 MED ORDER — SODIUM CHLORIDE 0.9 % IV SOLN
500.0000 mg | Freq: Once | INTRAVENOUS | Status: AC
  Administered 2022-03-14: 500 mg via INTRAVENOUS
  Filled 2022-03-14: qty 500

## 2022-03-14 MED ORDER — ONDANSETRON HCL 4 MG/2ML IJ SOLN: 4.0000 mg | Freq: Four times a day (QID) | INTRAMUSCULAR | Status: DC | PRN

## 2022-03-14 MED ORDER — DOCUSATE SODIUM 100 MG PO CAPS
100.0000 mg | ORAL_CAPSULE | Freq: Every day | ORAL | Status: DC
  Administered 2022-03-14 – 2022-03-16 (×3): 100 mg via ORAL
  Filled 2022-03-14 (×3): qty 1

## 2022-03-14 MED ORDER — IPRATROPIUM-ALBUTEROL 0.5-2.5 (3) MG/3ML IN SOLN
3.0000 mL | Freq: Once | RESPIRATORY_TRACT | Status: AC
  Administered 2022-03-14: 3 mL via RESPIRATORY_TRACT
  Filled 2022-03-14: qty 3

## 2022-03-14 MED ORDER — PANTOPRAZOLE SODIUM 40 MG PO TBEC
40.0000 mg | DELAYED_RELEASE_TABLET | Freq: Every day | ORAL | Status: DC
  Administered 2022-03-15 – 2022-03-17 (×3): 40 mg via ORAL
  Filled 2022-03-14 (×3): qty 1

## 2022-03-14 MED ORDER — METHYLPREDNISOLONE SODIUM SUCC 40 MG IJ SOLR
40.0000 mg | Freq: Two times a day (BID) | INTRAMUSCULAR | Status: AC
  Administered 2022-03-14 – 2022-03-15 (×2): 40 mg via INTRAVENOUS
  Filled 2022-03-14 (×2): qty 1

## 2022-03-14 MED ORDER — IPRATROPIUM-ALBUTEROL 0.5-2.5 (3) MG/3ML IN SOLN
3.0000 mL | Freq: Four times a day (QID) | RESPIRATORY_TRACT | Status: AC
  Administered 2022-03-14 – 2022-03-15 (×4): 3 mL via RESPIRATORY_TRACT
  Filled 2022-03-14 (×4): qty 3

## 2022-03-14 MED ORDER — ACETAMINOPHEN 500 MG PO TABS
1000.0000 mg | ORAL_TABLET | Freq: Two times a day (BID) | ORAL | Status: DC
  Administered 2022-03-14 – 2022-03-17 (×6): 1000 mg via ORAL
  Filled 2022-03-14 (×6): qty 2

## 2022-03-14 MED ORDER — PAROXETINE HCL 20 MG PO TABS
20.0000 mg | ORAL_TABLET | Freq: Every day | ORAL | Status: DC
  Administered 2022-03-15 – 2022-03-16 (×2): 20 mg via ORAL
  Filled 2022-03-14 (×2): qty 1

## 2022-03-14 MED ORDER — SODIUM CHLORIDE 0.9 % IV SOLN
2.0000 g | Freq: Once | INTRAVENOUS | Status: AC
  Administered 2022-03-14: 2 g via INTRAVENOUS
  Filled 2022-03-14: qty 12.5

## 2022-03-14 MED ORDER — PAROXETINE HCL 10 MG PO TABS
10.0000 mg | ORAL_TABLET | Freq: Every day | ORAL | Status: DC
  Administered 2022-03-15 – 2022-03-17 (×3): 10 mg via ORAL
  Filled 2022-03-14 (×3): qty 1

## 2022-03-14 NOTE — Assessment & Plan Note (Signed)
Stable Monitor renal function closely during this hospitalization Consult nephrology

## 2022-03-14 NOTE — Sepsis Progress Note (Signed)
Notified bedside nurse of need to draw lactic acid.  

## 2022-03-14 NOTE — ED Provider Notes (Signed)
[facility] Provider Note    Event Date/Time   First MD Initiated Contact with Patient 03/14/22 1233     (approximate)   History   Shortness of Breath   HPI  Stephanie [name] is a 86 y.o. female history of COPD presents to the ER for evaluation of worsening shortness of breath over the past several days.  She denies any pain or pressure.  No fevers.  Patient was given nebulizer in route with some improvement.  Was recently put on prednisone.  No recent antibiotics.     Physical Exam   Triage Vital Signs: ED Triage Vitals [03/14/22 1237]  Enc Vitals Group     BP (!) 100/58     Pulse Rate 90     Resp 16     Temp 97.9 F (36.6 C)     Temp Source Oral     SpO2 94 %     Weight      Height      Head Circumference      Peak Flow      Pain Score 0     Pain Loc      Pain Edu?      Excl. in [place]?     Most recent vital signs: Vitals:   03/14/22 1237  BP: (!) 100/58  Pulse: 90  Resp: 16  Temp: 97.9 F (36.6 C)  SpO2: 94%     Constitutional: Alert  Eyes: Conjunctivae are normal.  Head: Atraumatic. Nose: No congestion/rhinnorhea. Mouth/Throat: Mucous membranes are moist.   Neck: Painless ROM.  Cardiovascular:   Good peripheral circulation. No m/g/r Respiratory: Mild tachypnea with use of accessory muscles with prolonged expiratory phase and diffuse expiratory wheeze Gastrointestinal: Soft and nontender.  Musculoskeletal:  no deformity Neurologic:  MAE spontaneously. No gross focal neurologic deficits are appreciated.  Skin:  Skin is warm, dry and intact. No rash noted. Psychiatric: Mood and affect are normal. Speech and behavior are normal.    ED Results / Procedures / Treatments   Labs (all labs ordered are listed, but only abnormal results are displayed) Labs Reviewed  CBC WITH DIFFERENTIAL/PLATELET - Abnormal; Notable for the following components:      Result Value   WBC 16.1 (*)    RBC 3.32 (*)    Hemoglobin 10.0 (*)     HCT 31.1 (*)    Neutro Abs 13.4 (*)    Monocytes Absolute 1.1 (*)    Abs Immature Granulocytes 0.37 (*)    All other components within normal limits  COMPREHENSIVE METABOLIC PANEL - Abnormal; Notable for the following components:   Glucose, Bld 148 (*)    BUN 29 (*)    Creatinine, Ser 1.54 (*)    Calcium 8.8 (*)    Albumin 3.2 (*)    GFR, Estimated 33 (*)    All other components within normal limits  TROPONIN I (HIGH SENSITIVITY) - Abnormal; Notable for the following components:   Troponin I (High Sensitivity) 24 (*)    All other components within normal limits  SARS CORONAVIRUS 2 BY RT PCR  CULTURE, BLOOD (ROUTINE X 2)  CULTURE, BLOOD (ROUTINE X 2)  LACTIC ACID, PLASMA  LACTIC ACID, PLASMA     EKG  ED ECG REPORT I, Merlyn Lot, the attending physician, personally viewed and interpreted this ECG.   Date: 03/14/2022  EKG Time: 12:38  Rate: 90  Rhythm: sinus  Axis: normal  Intervals: normal  ST&T Change: no stemi, no depressions  RADIOLOGY Please see ED Course for my review and interpretation.  I personally reviewed all radiographic images ordered to evaluate for the above acute complaints and reviewed radiology reports and findings.  These findings were personally discussed with the patient.  Please see medical record for radiology report.    PROCEDURES:  Critical Care performed: Yes, see critical care procedure note(s)  .Critical Care  Performed by: Merlyn Lot, MD Authorized by: Merlyn Lot, MD   Critical care provider statement:    Critical care time (minutes):  35   Critical care was necessary to treat or prevent imminent or life-threatening deterioration of the following conditions:  Respiratory failure   Critical care was time spent personally by me on the following activities:  Ordering and performing treatments and interventions, ordering and review of laboratory studies, ordering and review of radiographic studies, pulse oximetry,  re-evaluation of patient's condition, review of old charts, obtaining history from patient or surrogate, examination of patient, evaluation of patient's response to treatment, discussions with primary provider, discussions with consultants and development of treatment plan with patient or surrogate    MEDICATIONS ORDERED IN ED: Medications  sterile water (preservative free) injection (has no administration in time range)  lactated ringers infusion (has no administration in time range)  ceFEPIme (MAXIPIME) 2 g in sodium chloride 0.9 % 100 mL IVPB (has no administration in time range)  azithromycin (ZITHROMAX) 500 mg in sodium chloride 0.9 % 250 mL IVPB (has no administration in time range)  ipratropium-albuterol (DUONEB) 0.5-2.5 (3) MG/3ML nebulizer solution 3 mL (3 mLs Nebulization Given 03/14/22 1248)  methylPREDNISolone sodium succinate (SOLU-MEDROL) 125 mg/2 mL injection 125 mg (125 mg Intravenous Given 03/14/22 1318)     IMPRESSION / MDM / [place] / ED COURSE  I reviewed the triage vital signs and the nursing notes.                              Differential diagnosis includes, but is not limited to, Asthma, copd, CHF, pna, ptx, malignancy, Pe, anemia   Presented to the ER for evaluation of symptoms as described above.  This presenting complaint could reflect a potentially life-threatening illness therefore the patient will be placed on continuous pulse oximetry and telemetry for monitoring.  Laboratory evaluation will be sent to evaluate for the above complaints.  Diffuse wheezing on exam concerning for COPD exacerbation.  Chest x-ray ordered for the but differential will order nebulizer treatment will order IV Solu-Medrol.   Clinical Course as of 03/14/22 1346  Mon Mar 14, 2022  1306 Chest x-ray on my review and interpretation concerning for right infiltrate.  Will await formal radiology report [PR]  7824 X-ray consistent with pneumonia.  Does have leukocytosis.  Will order  IV antibiotics.  Given her acute respiratory failure with hypoxia will require hospitalization.  I have consulted hospitalist for admission. [PR]    Clinical Course User Index [PR] Merlyn Lot, MD    FINAL CLINICAL IMPRESSION(S) / ED DIAGNOSES   Final diagnoses:  Acute respiratory failure with hypoxia ([place])  Pneumonia of right lower lobe due to infectious organism     Rx / DC Orders   ED Discharge Orders     None        Note:  This document was prepared using Dragon voice recognition software and may include unintentional dictation errors.    Merlyn Lot, MD 03/14/22 1346

## 2022-03-14 NOTE — Consult Note (Signed)
PHARMACY -  BRIEF ANTIBIOTIC NOTE   Pharmacy has received consult(s) for Cefepime from an ED provider.  The patient's profile has been reviewed for ht/wt/allergies/indication/available labs.    One time order(s) placed for Cefepime 2g IV x 1 dose.  Further antibiotics/pharmacy consults should be ordered by admitting physician if indicated.                       Thank you, Pearla Dubonnet 03/14/2022  1:27 PM

## 2022-03-14 NOTE — Assessment & Plan Note (Signed)
Most likely secondary to acute COPD exacerbation as well as lobar pneumonia Patient had room air pulse oximetry of 84% and is currently on 2 L of oxygen Will need to be assessed for home oxygen need prior to discharge Continue oxygen supplementation to maintain pulse oximetry greater than 92% Pulmonary following

## 2022-03-14 NOTE — Progress Notes (Signed)
Patient arrived to floor from ED.  Patient oriented to room nad call bell, bed alarm in use.  Family at bedside.

## 2022-03-14 NOTE — Assessment & Plan Note (Addendum)
Patient presents to the ER for evaluation of worsening shortness of breath from her baseline associated with a dry cough and wheezing. Imaging shows right lower lobe infiltrate Patient with a history of prior stroke and ? Dysphagia Concern for possible aspiration pneumonia Speech therapy for swallow function evaluation Pulmonary consult Place patient on Unasyn

## 2022-03-14 NOTE — Consult Note (Signed)
CODE SEPSIS - PHARMACY COMMUNICATION  **Broad Spectrum Antibiotics should be administered within 1 hour of Sepsis diagnosis**  Time Code Sepsis Called/Page Received: 1335  Antibiotics Ordered: Cefepime, Azithromycin  Time of 1st antibiotic administration: 1445  Additional action taken by pharmacy: none  If necessary, Name of Provider/Nurse Contacted: n/a    Pearla Dubonnet ,PharmD Clinical Pharmacist  03/14/2022  3:15 PM

## 2022-03-14 NOTE — Consult Note (Signed)
Pulmonary Medicine          Date: 03/14/2022,   MRN# [id number] Stephanie [name] Aug 09, 1935     AdmissionWeight: 53.1 kg                 CurrentWeight: 53.1 kg      CHIEF COMPLAINT:   Copd exacerbation and RLL pneumonia  HISTORY OF PRESENT ILLNESS   This is an 86 yr old lady, stage III copd, oyxgen desaturation with activity, in advertently refused the oxygen concentrator we ordered,  came in with falling sats as out lined and sob. On work up she was wheezing, low sats and cxr showed RLL pneumonia. She being here her acute bronchospasm is better.   PAST MEDICAL HISTORY   Past Medical History:  Diagnosis Date   Arthritis    Baker's cyst of knee, right    a. 08/2016 U/S: Baker's cyst noted in R popliteal fossa.   Complication of anesthesia    COPD (chronic obstructive pulmonary disease) ([place])    a. Home O2 started 08/2017.   Coronary artery disease    a. prior h/o stenting ~ 2005.   Depression    Edema    History of cardiac monitoring    a. 08/2016 s/p LINQ placement following CVA. No AFib to date (09/2017).   History of pneumonia    Hx of ischemic left MCA stroke    a. 08/2016 large posterior L MCA territory infarct in the setting of LLE DVT and suggestion of PFO on transcranial dopplers w/ bubble study (no PFO reported on echocardiogram)-->Eliquis.   Hypertension    Left leg DVT ([place])    a. 08/2016 U/S: subacute DVT in prox L popliteal vein w/ chronic superficial thrombosis in the L greater saphenous vein from the mid to prox L calf-->Eliquis.   PFO (patent foramen ovale)    a. 08/2016 Abnl TCD w/ bubble study suggesting large PFO. Not reported on echocardiogram.   PONV (postoperative nausea and vomiting)      SURGICAL HISTORY   Past Surgical History:  Procedure Laterality Date   BALLOON ANGIOPLASTY, ARTERY  05/17/2009   CATARACT EXTRACTION W/PHACO Left 05/24/2016   Procedure: CATARACT EXTRACTION PHACO AND INTRAOCULAR LENS PLACEMENT (IOC);  Surgeon:  Birder Robson, MD;  Location: [facility] ORS;  Service: Ophthalmology;  Laterality: Left;  Lot# [id number] H [place]:   00:52.3 AP%:   27.3 CDE:  14.26    CATARACT EXTRACTION W/PHACO Right 06/14/2016   Procedure: CATARACT EXTRACTION PHACO AND INTRAOCULAR LENS PLACEMENT ([place]);  Surgeon: Birder Robson, MD;  Location: [facility] ORS;  Service: Ophthalmology;  Laterality: Right;  Lot# [id number] H [place]: 01:13.4 AP%: 24.4 CDE: 17.85   CORONARY ANGIOPLASTY     STENT   EP IMPLANTABLE DEVICE N/A 08/19/2016   Procedure: Loop Recorder Insertion;  Surgeon: Will Meredith Leeds, MD;  Location: [place] [facility];  Service: Cardiovascular;  Laterality: N/A;     FAMILY HISTORY   Family History  Problem Relation Age of Onset   Hypertension Mother    Diabetes Mother    Pancreatic cancer Mother 36   Cancer Mother    Suicidality Father 99   Hypertension Father    Alcohol abuse Father    Hypertension Sister    Heart disease Sister    Kidney disease Sister    Varicose Veins Sister    Parkinson's disease Sister      SOCIAL HISTORY   Social History   Tobacco Use   Smoking status: Former  Types: Cigarettes    Quit date: 08/07/2001    Years since quitting: 20.6   Smokeless tobacco: Never  Vaping Use   Vaping Use: Never used  Substance Use Topics   Alcohol use: No    Alcohol/week: 0.0 standard drinks of alcohol   Drug use: No     MEDICATIONS    Home Medication:    Current Medication:  Current Facility-Administered Medications:    acetaminophen (TYLENOL) tablet 1,000 mg, 1,000 mg, Oral, BID, Agbata, Tochukwu, MD   albuterol (PROVENTIL) (2.5 MG/3ML) 0.083% nebulizer solution 2.5 mg, 2.5 mg, Nebulization, Q2H PRN, Agbata, Tochukwu, MD   Ampicillin-Sulbactam (UNASYN) 3 g in sodium chloride 0.9 % 100 mL IVPB, 3 g, Intravenous, Q12H, Nazari, Walid A, RPH   apixaban (ELIQUIS) tablet 2.5 mg, 2.5 mg, Oral, BID, Agbata, Tochukwu, MD   budesonide (PULMICORT) nebulizer solution 2 mg, 2 mg, Nebulization,  Q12H, Agbata, Tochukwu, MD   docusate sodium (COLACE) capsule 100 mg, 100 mg, Oral, QHS, Agbata, Tochukwu, MD   gabapentin (NEURONTIN) capsule 100 mg, 100 mg, Oral, BID, Agbata, Tochukwu, MD   ipratropium-albuterol (DUONEB) 0.5-2.5 (3) MG/3ML nebulizer solution 3 mL, 3 mL, Nebulization, Q6H, Agbata, Tochukwu, MD, 3 mL at 03/14/22 1535   lactated ringers infusion, , Intravenous, Continuous, Agbata, Tochukwu, MD, Last Rate: 100 mL/hr at 03/14/22 1706, Infusion Verify at 03/14/22 1706   methylPREDNISolone sodium succinate (SOLU-MEDROL) 40 mg/mL injection 40 mg, 40 mg, Intravenous, Q12H, Agbata, Tochukwu, MD   montelukast (SINGULAIR) tablet 10 mg, 10 mg, Oral, QHS, Agbata, Tochukwu, MD   [START ON 03/15/2022] multivitamin with minerals tablet 1 tablet, 1 tablet, Oral, Daily, Agbata, Tochukwu, MD   ondansetron (ZOFRAN) tablet 4 mg, 4 mg, Oral, Q6H PRN **OR** ondansetron (ZOFRAN) injection 4 mg, 4 mg, Intravenous, Q6H PRN, Agbata, Tochukwu, MD   [START ON 03/15/2022] pantoprazole (PROTONIX) EC tablet 40 mg, 40 mg, Oral, Daily, Agbata, Tochukwu, MD   [START ON 03/15/2022] PARoxetine (PAXIL) tablet 10 mg, 10 mg, Oral, Daily, Agbata, Tochukwu, MD   [START ON 03/15/2022] PARoxetine (PAXIL) tablet 20 mg, 20 mg, Oral, Daily, Agbata, Tochukwu, MD   sterile water (preservative free) injection, , , ,     ALLERGIES   Duloxetine hcl, Influenza vaccines, and Venlafaxine     REVIEW OF SYSTEMS    Review of Systems:  Gen:  Denies  fever, sweats, chills weigh loss  HEENT: Denies blurred vision, double vision, ear pain, eye pain, hearing loss, nose bleeds, sore throat Cardiac:  No dizziness, chest pain or heaviness, chest tightness,edema Resp:   Denies cough or sputum porduction, shortness of breath,wheezing, hemoptysis,  Gi: Denies swallowing difficulty, stomach pain, nausea or vomiting, diarrhea, constipation, bowel incontinence Gu:  Denies bladder incontinence, burning urine Ext:   Denies Joint pain, stiffness  or swelling Skin: Denies  skin rash, easy bruising or bleeding or hives Endoc:  Denies polyuria, polydipsia , polyphagia or weight change Psych:   Denies depression, insomnia or hallucinations   Other:  All other systems negative   VS: BP 126/64 (BP Location: Right Arm)   Pulse 89   Temp 98.2 F (36.8 C) (Oral)   Resp 19   Ht 5\' 2"  (1.575 m)   Wt 53.1 kg   SpO2 94%   BMI 21.41 kg/m      PHYSICAL EXAM    GENERAL:NAD, no fevers, chills, no weakness laying in bed, [place] 02 in place one daughter in the room HEAD: Normocephalic, atraumatic.  EYES: Pupils equal, round, reactive to light. Extraocular muscles  intact. No scleral icterus.  MOUTH: Moist mucosal membrane. Dentition intact. No abscess noted.  EAR, NOSE, THROAT: Clear without exudates. No external lesions.  NECK: Supple. No thyromegaly. No nodules. No JVD.  PULMONARY: Diffuse mid exp wheezes CARDIOVASCULAR: S1 and S2. Regular rate and rhythm. No murmurs, rubs, or gallops. No edema. Pedal pulses 2+ bilaterally.  GASTROINTESTINAL: Soft, nontender, nondistended. No masses. Positive bowel sounds. No hepatosplenomegaly.  MUSCULOSKELETAL: No swelling, clubbing, or edema. Range of motion full in all extremities.  NEUROLOGIC: Cranial nerves II through XII are intact. No gross focal neurological deficits. Sensation intact. Reflexes intact.  SKIN: No ulceration, lesions, rashes, or cyanosis. Skin warm and dry. Turgor intact.  PSYCHIATRIC: Mood, affect within normal limits. The patient is awake, alert and oriented x 3. Insight, judgment intact.       IMAGING    DG Chest Portable 1 View  Result Date: 03/14/2022 CLINICAL DATA:  Shortness of breath EXAM: PORTABLE CHEST 1 VIEW COMPARISON:  11/26/2020 FINDINGS: Left-sided implanted loop recorder device. The heart size and mediastinal contours are within normal limits. Aortic atherosclerosis. Patchy airspace opacity throughout the right lower lobe. Left lung is clear. No pleural effusion  or pneumothorax. IMPRESSION: Patchy airspace opacity throughout the right lower lobe suspicious for pneumonia. Radiographic follow-up to resolution is recommended. Electronically Signed   By: Davina Poke D.O.   On: 03/14/2022 13:11      ASSESSMENT/PLAN   This is an 33 ye old lady here with RLL  pneumonia, stage III copd, oxygen desaturation prior to admission.  -oxygen augmentation,keep sats around 92-94 % -duo nebs and dulera and budenoside 0.5 bid via neb -solumedrol 40 mg iv q 12hrs -agree with present antibiotics -dvt prophylaxis -out patient f/u post d/c -further orders per above       Thank you for allowing me to participate in the care of this patient.   Patient/Family are satisfied with care plan and all questions have been answered.  This document was prepared using Dragon voice recognition software and may include unintentional dictation errors.     Wallene Huh, M.D.  Division of [place]

## 2022-03-14 NOTE — Assessment & Plan Note (Signed)
Patient with a history of prior CVA.  Continue Eliquis.  Continue Norvasc Hold metoprolol due to relative hypotension.  Resume as blood pressure improves

## 2022-03-14 NOTE — Assessment & Plan Note (Addendum)
Patient with a known history of COPD who presents for evaluation of worsening shortness of breath from baseline as well as wheezing Place patient on scheduled and as needed bronchodilator therapy Place patient on inhaled and systemic steroids

## 2022-03-14 NOTE — Sepsis Progress Note (Signed)
Sepsis protocol monitored by eLink 

## 2022-03-14 NOTE — H&P (Signed)
History and Physical    Patient: Stephanie [name] UKG:[id number] DOB: 1936-04-05 DOA: 03/14/2022 DOS: the patient was seen and examined on 03/14/2022 PCP: Adin Hector, MD  Patient coming from: Home  Chief Complaint:  Chief Complaint  Patient presents with   Shortness of Breath    Most of the history is obtained from patient's daughters  at the bedside HPI: Stephanie [name] is a 86 y.o. female with medical history significant for CVA, COPD, depression, coronary artery disease, DVT who presents to the ER via EMS for evaluation of worsening shortness of breath for over a week. They state that their mother has had worsening shortness of breath associated with wheezing for over a week despite using her inhaler and was prescribed a course of steroids with no significant improvement.  Wheezing is associated with a nonproductive cough and shortness of breath but no fever or chills. She was found to have pulse oximetry in the low 80s and they contacted her pulmonologist who ordered her some home oxygen which has not been delivered yet. EMS was called due to persistent symptoms and when they arrived patient was found to have room air pulse oximetry of 86%. Patient has had a prior stroke but according to her daughters is on a regular diet.  She has had some choking episodes but none recently. She denies having any chest pain, no nausea, no vomiting, no headache, no dizziness, no lightheadedness, no blurred vision, no urinary symptoms, no focal deficit. Patient is currently on 2 L of oxygen with pulse oximetry greater than 92%. Chest x-ray shows a right lower lobe pneumonia She received IV cefepime and IV Solu-Medrol in the ER and will be admitted to the hospital for further evaluation.   Review of Systems: As mentioned in the history of present illness. All other systems reviewed and are negative. Past Medical History:  Diagnosis Date   Arthritis    Baker's cyst of knee, right    a.  08/2016 U/S: Baker's cyst noted in R popliteal fossa.   Complication of anesthesia    COPD (chronic obstructive pulmonary disease) ([place])    a. Home O2 started 08/2017.   Coronary artery disease    a. prior h/o stenting ~ 2005.   Depression    Edema    History of cardiac monitoring    a. 08/2016 s/p LINQ placement following CVA. No AFib to date (09/2017).   History of pneumonia    Hx of ischemic left MCA stroke    a. 08/2016 large posterior L MCA territory infarct in the setting of LLE DVT and suggestion of PFO on transcranial dopplers w/ bubble study (no PFO reported on echocardiogram)-->Eliquis.   Hypertension    Left leg DVT ([place])    a. 08/2016 U/S: subacute DVT in prox L popliteal vein w/ chronic superficial thrombosis in the L greater saphenous vein from the mid to prox L calf-->Eliquis.   PFO (patent foramen ovale)    a. 08/2016 Abnl TCD w/ bubble study suggesting large PFO. Not reported on echocardiogram.   PONV (postoperative nausea and vomiting)    Past Surgical History:  Procedure Laterality Date   BALLOON ANGIOPLASTY, ARTERY  05/17/2009   CATARACT EXTRACTION W/PHACO Left 05/24/2016   Procedure: CATARACT EXTRACTION PHACO AND INTRAOCULAR LENS PLACEMENT (IOC);  Surgeon: Birder Robson, MD;  Location: [facility]S;  Service: Ophthalmology;  Laterality: Left;  Lot# [id number] H [place]:   00:52.3 AP%:   27.3 CDE:  14.26    CATARACT EXTRACTION W/PHACO  Right 06/14/2016   Procedure: CATARACT EXTRACTION PHACO AND INTRAOCULAR LENS PLACEMENT (IOC);  Surgeon: Birder Robson, MD;  Location: [facility] ORS;  Service: Ophthalmology;  Laterality: Right;  Lot# [id number] H [place]: 01:13.4 AP%: 24.4 CDE: 17.85   CORONARY ANGIOPLASTY     STENT   EP IMPLANTABLE DEVICE N/A 08/19/2016   Procedure: Loop Recorder Insertion;  Surgeon: Will Meredith Leeds, MD;  Location: [facility];  Service: Cardiovascular;  Laterality: N/A;   Social History:  reports that she quit smoking about 20 years ago. Her smoking use  included cigarettes. She has never used smokeless tobacco. She reports that she does not drink alcohol and does not use drugs.  Allergies  Allergen Reactions   Duloxetine Hcl Other (See Comments)    Altered Mental Status   Influenza Vaccines Swelling    At injection site   Venlafaxine Nausea And Vomiting    Family History  Problem Relation Age of Onset   Hypertension Mother    Diabetes Mother    Pancreatic cancer Mother 23   Cancer Mother    Suicidality Father 4   Hypertension Father    Alcohol abuse Father    Hypertension Sister    Heart disease Sister    Kidney disease Sister    Varicose Veins Sister    Parkinson's disease Sister     Prior to Admission medications   Medication Sig Start Date End Date Taking? Authorizing Provider  acetaminophen (TYLENOL) 500 MG tablet Take 1,000 mg by mouth 2 (two) times daily.     [provider]  albuterol (PROAIR HFA) 108 (90 Base) MCG/ACT inhaler INHALE TWO PUFFS INTO LUNGS EVERY 6 HOURS AS NEEDED 09/02/19   Birdie Sons, MD  amLODipine (NORVASC) 5 MG tablet TAKE 1 TABLET BY MOUTH EVERY DAY 01/11/21   Chrismon, Vickki Muff, PA-C  apixaban (ELIQUIS) 2.5 MG TABS tablet Take 1 tablet (2.5 mg total) by mouth 2 (two) times daily. 05/12/20   Chrismon, Vickki Muff, PA-C  Calcium Carb-Cholecalciferol 600-800 MG-UNIT TABS Take 1 tablet by mouth daily.     [provider]  docusate sodium (COLACE) 250 MG capsule Take 100 mg by mouth at bedtime.     [provider]  gabapentin (NEURONTIN) 100 MG capsule TAKE 1 CAPSULE (100MG ) BY MOUTH TWICE DAILY 01/11/21   Chrismon, Vickki Muff, PA-C  loratadine (CLARITIN) 10 MG tablet Take 10 mg by mouth daily.    [provider]  montelukast (SINGULAIR) 10 MG tablet Take 10 mg by mouth at bedtime. 10/09/19   [provider]  Multiple Vitamin (MULTIVITAMIN) tablet Take 1 tablet by mouth daily.    [provider]  omeprazole (PRILOSEC) 20 MG capsule TAKE 1 CAPSULE BY MOUTH  EVERY DAY 07/27/19   Chrismon, Vickki Muff, PA-C  PARoxetine (PAXIL) 10 MG tablet TAKE 1 TABLET (10MG ) IN THE MORNING AND 2 TABLETS (20MG ) AT BEDTIME 05/01/21   Chrismon, Vickki Muff, PA-C  predniSONE (DELTASONE) 10 MG tablet Take 1 tablet (10 mg total) by mouth daily. Day 1-3: take 4 tablets PO daily Day 4-6: take 3 tablets PO daily Day 7-9: take 2 tablets PO daily Day 10-12: take 1 tablet PO daily 11/26/20   Harvest Dark, MD  Spacer/Aero Chamber Mouthpiece MISC Use spacer on inhalers for better administration. Patient not taking: Reported on 09/08/2020 10/06/16   Chrismon, Vickki Muff, PA-C  SYMBICORT 160-4.5 MCG/ACT inhaler INHALE TWO PUFFS INTO THE LUNGS TWICE A DAY 03/11/19   Chrismon, Vickki Muff, PA-C  tiotropium [facility]  HANDIHALER) 18 MCG inhalation capsule Place 1 capsule (18 mcg total) into inhaler and inhale daily. One puff daily 09/02/19   Birdie Sons, MD  triamcinolone (KENALOG) 0.025 % cream w/ Cerave - APPLY 1 APPLICATION(S) TOPICAL DAILY FROM THE NECK DOWN Patient not taking: Reported on 09/08/2020 11/06/17   [provider]    Physical Exam: Vitals:   03/14/22 1237 03/14/22 1529 03/14/22 1535  BP: (!) 100/58 126/64   Pulse: 90 89   Resp: 16 19   Temp: 97.9 F (36.6 C) 98.2 F (36.8 C)   TempSrc: Oral Oral   SpO2: 94% 96% 94%   Physical Exam Vitals and nursing note reviewed.  Constitutional:      Appearance: She is well-developed.     Comments: Chronically ill-appearing.  Appears comfortable and in no distress.  HENT:     Head: Normocephalic and atraumatic.     Mouth/Throat:     Comments: Dry mucous membranes Eyes:     Pupils: Pupils are equal, round, and reactive to light.  Cardiovascular:     Rate and Rhythm: Normal rate and regular rhythm.  Pulmonary:     Effort: Pulmonary effort is normal.     Breath sounds: Examination of the right-upper field reveals wheezing. Examination of the left-upper field reveals wheezing. Examination of the right-middle field reveals  wheezing. Examination of the left-middle field reveals wheezing. Examination of the right-lower field reveals wheezing and rhonchi. Examination of the left-lower field reveals wheezing. Wheezing and rhonchi present.  Abdominal:     General: Bowel sounds are normal.     Palpations: Abdomen is soft.  Musculoskeletal:        General: Normal range of motion.     Cervical back: Normal range of motion and neck supple.  Skin:    General: Skin is warm and dry.  Neurological:     General: No focal deficit present.     Mental Status: She is alert.  Psychiatric:        Mood and Affect: Mood normal.        Behavior: Behavior normal.     Data Reviewed: Relevant notes from primary care and specialist visits, past discharge summaries as available in EHR, including Care Everywhere. Prior diagnostic testing as pertinent to current admission diagnoses Updated medications and problem lists for reconciliation ED course, including vitals, labs, imaging, treatment and response to treatment Triage notes, nursing and pharmacy notes and ED provider's notes Notable results as noted in HPI Labs reviewed.  Sodium 135, potassium 3.9, chloride 103, bicarb 22, glucose 148, BUN 29, creatinine 1.5, calcium 8.8, total protein 6.7, albumin 3.2, AST 15, ALT 14, alkaline phosphatase 80, total bilirubin 0.5, troponin 24, white count 16.1, hemoglobin 10.0, hematocrit 31, platelet count 310 SARS coronavirus 2 PCR is negative Chest x-ray reviewed by me shows Patchy airspace opacity throughout the right lower lobe suspicious for pneumonia.  There are no new results to review at this time.  Assessment and Plan: * Lobar pneumonia ([place]) Patient presents to the ER for evaluation of worsening shortness of breath from her baseline associated with a dry cough and wheezing. Imaging shows right lower lobe infiltrate Patient with a history of prior stroke and ? Dysphagia Concern for possible aspiration pneumonia Speech therapy for  swallow function evaluation Pulmonary consult Place patient on Unasyn  Acute respiratory failure with hypoxia ([place]) Most likely secondary to acute COPD exacerbation as well as lobar pneumonia Patient had room air pulse oximetry of 84% and is currently  on 2 L of oxygen Will need to be assessed for home oxygen need prior to discharge Continue oxygen supplementation to maintain pulse oximetry greater than 92%  History of stroke Patient with a history of prior CVA Hold metoprolol due to relative hypotension  Acute exacerbation of chronic obstructive pulmonary disease (COPD) ([place]) Patient with a known history of COPD who presents for evaluation of worsening shortness of breath from baseline as well as wheezing Place patient on scheduled and as needed bronchodilator therapy Place patient on inhaled and systemic steroids  DVT (deep venous thrombosis) (HCC) Stable Continue apixaban  Depression Stable Continue Paxil  CKD (chronic kidney disease) stage 3, GFR 30-59 ml/min (HCC) Stable Monitor renal function closely during this hospitalization Consult nephrology      Advance Care Planning:   Code Status: DNR   Consults: Pulmonary, speech therapy  Family Communication: Greater than 50% of time was spent discussing patient's condition and plan of care with her and her daughters at the bedside.  All questions and concerns have been addressed.  They verbalized understanding and agree with the plan.  CODE STATUS was discussed and she is a DO NOT RESUSCITATE  Severity of Illness: The appropriate patient status for this patient is INPATIENT. Inpatient status is judged to be reasonable and necessary in order to provide the required intensity of service to ensure the patient's safety. The patient's presenting symptoms, physical exam findings, and initial radiographic and laboratory data in the context of their chronic comorbidities is felt to place them at high risk for further clinical  deterioration. Furthermore, it is not anticipated that the patient will be medically stable for discharge from the hospital within 2 midnights of admission.   * I certify that at the point of admission it is my clinical judgment that the patient will require inpatient hospital care spanning beyond 2 midnights from the point of admission due to high intensity of service, high risk for further deterioration and high frequency of surveillance required.*  Author: Collier Bullock, MD 03/14/2022 4:00 PM  For on call review [email or web address].

## 2022-03-14 NOTE — ED Triage Notes (Signed)
Pt from home via [facility] with reports of [facility] that started Friday. Pt reports she is waiting for oxygen to be delivered to her house. She uses O2 as needed. Pt 86% on RA upon arrival.

## 2022-03-14 NOTE — Assessment & Plan Note (Signed)
--   Stable.  Continue apixaban. 

## 2022-03-14 NOTE — Assessment & Plan Note (Signed)
Stable Continue Paxil 

## 2022-03-15 DIAGNOSIS — J441 Chronic obstructive pulmonary disease with (acute) exacerbation: Secondary | ICD-10-CM

## 2022-03-15 DIAGNOSIS — J9601 Acute respiratory failure with hypoxia: Secondary | ICD-10-CM

## 2022-03-15 DIAGNOSIS — Z8673 Personal history of transient ischemic attack (TIA), and cerebral infarction without residual deficits: Secondary | ICD-10-CM

## 2022-03-15 DIAGNOSIS — J181 Lobar pneumonia, unspecified organism: Secondary | ICD-10-CM | POA: Diagnosis not present

## 2022-03-15 LAB — LACTIC ACID, PLASMA
Lactic Acid, Venous: 3.5 mmol/L (ref 0.5–1.9)
Lactic Acid, Venous: 3.1 mmol/L (ref 0.5–1.9)

## 2022-03-15 LAB — BASIC METABOLIC PANEL
Anion gap: 6 (ref 5–15)
BUN: 28 mg/dL — ABNORMAL HIGH (ref 8–23)
CO2: 24 mmol/L (ref 22–32)
Calcium: 8.9 mg/dL (ref 8.9–10.3)
Chloride: 107 mmol/L (ref 98–111)
Creatinine, Ser: 1.34 mg/dL — ABNORMAL HIGH (ref 0.44–1.00)
GFR, Estimated: 39 mL/min — ABNORMAL LOW (ref >60)
Glucose, Bld: 169 mg/dL — ABNORMAL HIGH (ref 70–99)
Potassium: 4.5 mmol/L (ref 3.5–5.1)
Sodium: 137 mmol/L (ref 135–145)

## 2022-03-15 LAB — CBC
HCT: 29.1 % — ABNORMAL LOW (ref 36.0–46.0)
Hemoglobin: 9.5 g/dL — ABNORMAL LOW (ref 12.0–15.0)
MCH: 30.5 pg (ref 26.0–34.0)
MCHC: 32.6 g/dL (ref 30.0–36.0)
MCV: 93.6 fL (ref 80.0–100.0)
Platelets: 288 10*3/uL (ref 150–400)
RBC: 3.11 MIL/uL — ABNORMAL LOW (ref 3.87–5.11)
RDW: 14.4 % (ref 11.5–15.5)
WBC: 9.1 10*3/uL (ref 4.0–10.5)
nRBC: 0 % (ref 0.0–0.2)

## 2022-03-15 MED ORDER — PREDNISONE 20 MG PO TABS
40.0000 mg | ORAL_TABLET | Freq: Every day | ORAL | Status: DC
  Administered 2022-03-15 – 2022-03-17 (×3): 40 mg via ORAL
  Filled 2022-03-15 (×3): qty 2

## 2022-03-15 MED ORDER — TIOTROPIUM BROMIDE MONOHYDRATE 18 MCG IN CAPS
1.0000 | ORAL_CAPSULE | Freq: Every day | RESPIRATORY_TRACT | Status: DC
  Administered 2022-03-16 – 2022-03-17 (×2): 18 ug via RESPIRATORY_TRACT
  Filled 2022-03-15: qty 5

## 2022-03-15 MED ORDER — MOMETASONE FURO-FORMOTEROL FUM 200-5 MCG/ACT IN AERO
2.0000 | INHALATION_SPRAY | Freq: Two times a day (BID) | RESPIRATORY_TRACT | Status: DC
  Administered 2022-03-15 – 2022-03-17 (×4): 2 via RESPIRATORY_TRACT
  Filled 2022-03-15: qty 8.8

## 2022-03-15 MED ORDER — BUDESONIDE 0.5 MG/2ML IN SUSP: 0.5000 mg | Freq: Two times a day (BID) | RESPIRATORY_TRACT | Status: DC

## 2022-03-15 MED ORDER — AMLODIPINE BESYLATE 5 MG PO TABS
5.0000 mg | ORAL_TABLET | Freq: Every day | ORAL | Status: DC
  Administered 2022-03-15 – 2022-03-17 (×3): 5 mg via ORAL
  Filled 2022-03-15 (×3): qty 1

## 2022-03-15 NOTE — Evaluation (Signed)
Clinical/Bedside Swallow Evaluation Patient Details  Name: Stephanie [name] MRN: [id number] Date of Birth: 1936-02-21  Today's Date: 03/15/2022 Time: SLP Start Time (ACUTE ONLY): 1012 SLP Stop Time (ACUTE ONLY): 1025 SLP Time Calculation (min) (ACUTE ONLY): 13 min  Past Medical History:  Past Medical History:  Diagnosis Date   Arthritis    Baker's cyst of knee, right    a. 08/2016 U/S: Baker's cyst noted in R popliteal fossa.   Complication of anesthesia    COPD (chronic obstructive pulmonary disease) ([place])    a. Home O2 started 08/2017.   Coronary artery disease    a. prior h/o stenting ~ 2005.   Depression    Edema    History of cardiac monitoring    a. 08/2016 s/p LINQ placement following CVA. No AFib to date (09/2017).   History of pneumonia    Hx of ischemic left MCA stroke    a. 08/2016 large posterior L MCA territory infarct in the setting of LLE DVT and suggestion of PFO on transcranial dopplers w/ bubble study (no PFO reported on echocardiogram)-->Eliquis.   Hypertension    Left leg DVT ([place])    a. 08/2016 U/S: subacute DVT in prox L popliteal vein w/ chronic superficial thrombosis in the L greater saphenous vein from the mid to prox L calf-->Eliquis.   PFO (patent foramen ovale)    a. 08/2016 Abnl TCD w/ bubble study suggesting large PFO. Not reported on echocardiogram.   PONV (postoperative nausea and vomiting)    Past Surgical History:  Past Surgical History:  Procedure Laterality Date   BALLOON ANGIOPLASTY, ARTERY  05/17/2009   CATARACT EXTRACTION W/PHACO Left 05/24/2016   Procedure: CATARACT EXTRACTION PHACO AND INTRAOCULAR LENS PLACEMENT ([place]);  Surgeon: Birder Robson, MD;  Location: [facility] ORS;  Service: Ophthalmology;  Laterality: Left;  Lot# [id number] H [place]:   00:52.3 AP%:   27.3 CDE:  14.26    CATARACT EXTRACTION W/PHACO Right 06/14/2016   Procedure: CATARACT EXTRACTION PHACO AND INTRAOCULAR LENS PLACEMENT ([place]);  Surgeon: Birder Robson, MD;  Location: [facility]  [facility];  Service: Ophthalmology;  Laterality: Right;  Lot# [id number] H [place]: 01:13.4 AP%: 24.4 CDE: 17.85   CORONARY ANGIOPLASTY     STENT   EP IMPLANTABLE DEVICE N/A 08/19/2016   Procedure: Loop Recorder Insertion;  Surgeon: Will Meredith Leeds, MD;  Location: [facility];  Service: Cardiovascular;  Laterality: N/A;   HPI:  This is an 86 yr old lady, stage III copd, oyxgen desaturation with activity, in advertently refused the oxygen concentrator we ordered,  came in with falling sats as out lined and sob. On work up she was wheezing, low sats and cxr showed RLL pneumonia. She being here her acute bronchospasm is better.    Assessment / Plan / Recommendation  Clinical Impression  Pt presents with adequate oropharyngeal abilities when consuming thin liquids via straw, puree and solids. As such her risk of aspiration appears reduced when following general aspiration precautions. Pt's daughters were present with all questions answered to their satisfaction. skilled ST services are not indicated at this time.  SLP Visit Diagnosis: Dysphagia, unspecified (R13.10)    Aspiration Risk  Mild aspiration risk    Diet Recommendation Regular;Thin liquid   Liquid Administration via: Straw;Cup Medication Administration: Whole meds with liquid Supervision: Patient able to self feed Compensations: Minimize environmental distractions;Slow rate;Small sips/bites Postural Changes: Seated upright at 90 degrees;Remain upright for at least 30 minutes after po intake    Other  Recommendations Oral Care Recommendations: Oral  care BID    Recommendations for follow up therapy are one component of a multi-disciplinary discharge planning process, led by the attending physician.  Recommendations may be updated based on patient status, additional functional criteria and insurance authorization.  Follow up Recommendations No SLP follow up      Assistance Recommended at Discharge None  Functional Status  Assessment Patient has not had a recent decline in their functional status    Swallow Study   General Date of Onset: 03/14/22 HPI: This is an 86 yr old lady, stage III copd, oyxgen desaturation with activity, in advertently refused the oxygen concentrator we ordered,  came in with falling sats as out lined and sob. On work up she was wheezing, low sats and cxr showed RLL pneumonia. She being here her acute bronchospasm is better. Type of Study: Bedside Swallow Evaluation Previous Swallow Assessment: none in chart Diet Prior to this Study: Thin liquids Temperature Spikes Noted: No Respiratory Status: Nasal cannula (6 L) History of Recent Intubation: No Behavior/Cognition: Alert;Cooperative;Pleasant mood Oral Cavity Assessment: Within Functional Limits Oral Care Completed by SLP: No Oral Cavity - Dentition: Adequate natural dentition Vision: Functional for self-feeding Self-Feeding Abilities: Able to feed self Patient Positioning: Upright in bed Baseline Vocal Quality: Normal Volitional Cough: Strong Volitional Swallow: Able to elicit    Oral/Motor/Sensory Function Overall Oral Motor/Sensory Function: Within functional limits   Ice Chips Ice chips: Not tested   Thin Liquid Thin Liquid: Within functional limits Presentation: Self Fed;Straw    Nectar Thick Nectar Thick Liquid: Not tested   Honey Thick Honey Thick Liquid: Not tested   Puree Puree: Within functional limits Presentation: Self Fed;Spoon   Solid     Solid: Within functional limits Presentation: Self Fed     [name] B. Rutherford Nail, M.S., CCC-SLP, Mining engineer Certified Brain Injury [place]  [place] Office [phone] Ascom [phone] Fax [phone]

## 2022-03-15 NOTE — Progress Notes (Signed)
Mobility Specialist - Progress Note   03/15/22 0849  Mobility  Activity Ambulated with assistance in room;Ambulated with assistance to bathroom  Level of Assistance Standby assist, set-up cues, supervision of patient - no hands on  Assistive Device Cane  Distance Ambulated (ft) 20 ft  Activity Response Tolerated well  $Mobility charge 1 Mobility    During mobility: 121 HR, 84% SpO2 Post-mobility: 114 HR, 98% SpO2   Pt lying in bed upon arrival, utilizing 2L. Pt performed bed mobility modI; sat EOB and stood with supervision. Good balance while ambulating to bathroom with SPC. Does become winded with minimal exertion, however. O2 desat to mid 80s during session with extensive recovery. 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 Some wheezing noted. Performed seated peri-care without assist. Pt returned to bed with alarm set, needs in reach. Family at bedside.    Kathee Delton Mobility Specialist 03/15/22, 12:54 PM

## 2022-03-15 NOTE — Progress Notes (Signed)
Pulmonary Medicine          Date: 03/15/2022,   MRN# [id number] Stephanie [name] 07-28-1936     HISTORY OF PRESENT ILLNESS   Had an uneventful night, still wheezing bit less. Meds being adjusted.  Also being treated for pneumonia  ( RLL). No pleurisy, no fever or chills. No hemoptysis. Appetite is better. See last notes. PAST MEDICAL HISTORY   Past Medical History:  Diagnosis Date   Arthritis    Baker's cyst of knee, right    a. 08/2016 U/S: Baker's cyst noted in R popliteal fossa.   Complication of anesthesia    COPD (chronic obstructive pulmonary disease) ([place])    a. Home O2 started 08/2017.   Coronary artery disease    a. prior h/o stenting ~ 2005.   Depression    Edema    History of cardiac monitoring    a. 08/2016 s/p LINQ placement following CVA. No AFib to date (09/2017).   History of pneumonia    Hx of ischemic left MCA stroke    a. 08/2016 large posterior L MCA territory infarct in the setting of LLE DVT and suggestion of PFO on transcranial dopplers w/ bubble study (no PFO reported on echocardiogram)-->Eliquis.   Hypertension    Left leg DVT ([place])    a. 08/2016 U/S: subacute DVT in prox L popliteal vein w/ chronic superficial thrombosis in the L greater saphenous vein from the mid to prox L calf-->Eliquis.   PFO (patent foramen ovale)    a. 08/2016 Abnl TCD w/ bubble study suggesting large PFO. Not reported on echocardiogram.   PONV (postoperative nausea and vomiting)      SURGICAL HISTORY   Past Surgical History:  Procedure Laterality Date   BALLOON ANGIOPLASTY, ARTERY  05/17/2009   CATARACT EXTRACTION W/PHACO Left 05/24/2016   Procedure: CATARACT EXTRACTION PHACO AND INTRAOCULAR LENS PLACEMENT (IOC);  Surgeon: Birder Robson, MD;  Location: [facility];  Service: Ophthalmology;  Laterality: Left;  Lot# [id number] H [place]:   00:52.3 AP%:   27.3 CDE:  14.26    CATARACT EXTRACTION W/PHACO Right 06/14/2016   Procedure: CATARACT EXTRACTION PHACO AND  INTRAOCULAR LENS PLACEMENT ([place]);  Surgeon: Birder Robson, MD;  Location: [facility];  Service: Ophthalmology;  Laterality: Right;  Lot# [id number] H [place]: 01:13.4 AP%: 24.4 CDE: 17.85   CORONARY ANGIOPLASTY     STENT   EP IMPLANTABLE DEVICE N/A 08/19/2016   Procedure: Loop Recorder Insertion;  Surgeon: Will Meredith Leeds, MD;  Location: [facility];  Service: Cardiovascular;  Laterality: N/A;     FAMILY HISTORY   Family History  Problem Relation Age of Onset   Hypertension Mother    Diabetes Mother    Pancreatic cancer Mother 60   Cancer Mother    Suicidality Father 50   Hypertension Father    Alcohol abuse Father    Hypertension Sister    Heart disease Sister    Kidney disease Sister    Varicose Veins Sister    Parkinson's disease Sister      SOCIAL HISTORY   Social History   Tobacco Use   Smoking status: Former    Types: Cigarettes    Quit date: 08/07/2001    Years since quitting: 20.6   Smokeless tobacco: Never  Vaping Use   Vaping Use: Never used  Substance Use Topics   Alcohol use: No    Alcohol/week: 0.0 standard drinks of alcohol   Drug use: No     MEDICATIONS  Home Medication:    Current Medication:  Current Facility-Administered Medications:    acetaminophen (TYLENOL) tablet 1,000 mg, 1,000 mg, Oral, BID, Agbata, Tochukwu, MD, 1,000 mg at 03/15/22 0845   albuterol (PROVENTIL) (2.5 MG/3ML) 0.083% nebulizer solution 2.5 mg, 2.5 mg, Nebulization, Q2H PRN, Agbata, Tochukwu, MD   amLODipine (NORVASC) tablet 5 mg, 5 mg, Oral, Daily, Manuella Ghazi, Vipul, MD   Ampicillin-Sulbactam (UNASYN) 3 g in sodium chloride 0.9 % 100 mL IVPB, 3 g, Intravenous, Q12H, Nazari, Walid A, RPH, Last Rate: 200 mL/hr at 03/15/22 0851, 3 g at 03/15/22 0851   apixaban (ELIQUIS) tablet 2.5 mg, 2.5 mg, Oral, BID, Agbata, Tochukwu, MD, 2.5 mg at 03/15/22 0844   docusate sodium (COLACE) capsule 100 mg, 100 mg, Oral, QHS, Agbata, Tochukwu, MD, 100 mg at 03/14/22 2129   gabapentin  (NEURONTIN) capsule 100 mg, 100 mg, Oral, BID, Agbata, Tochukwu, MD, 100 mg at 03/15/22 0844   mometasone-formoterol (DULERA) 200-5 MCG/ACT inhaler 2 puff, 2 puff, Inhalation, BID, Manuella Ghazi, Vipul, MD   montelukast (SINGULAIR) tablet 10 mg, 10 mg, Oral, QHS, Agbata, Tochukwu, MD, 10 mg at 03/14/22 2129   multivitamin with minerals tablet 1 tablet, 1 tablet, Oral, Daily, Agbata, Tochukwu, MD, 1 tablet at 03/15/22 0845   ondansetron (ZOFRAN) tablet 4 mg, 4 mg, Oral, Q6H PRN **OR** ondansetron (ZOFRAN) injection 4 mg, 4 mg, Intravenous, Q6H PRN, Agbata, Tochukwu, MD   pantoprazole (PROTONIX) EC tablet 40 mg, 40 mg, Oral, Daily, Agbata, Tochukwu, MD, 40 mg at 03/15/22 0844   PARoxetine (PAXIL) tablet 10 mg, 10 mg, Oral, Daily, Agbata, Tochukwu, MD, 10 mg at 03/15/22 0845   PARoxetine (PAXIL) tablet 20 mg, 20 mg, Oral, Daily, Agbata, Tochukwu, MD   predniSONE (DELTASONE) tablet 40 mg, 40 mg, Oral, Q breakfast, Max Sane, MD   tiotropium (SPIRIVA) inhalation capsule ([facility] use ONLY) 18 mcg, 1 capsule, Inhalation, Daily, Manuella Ghazi, Vipul, MD    ALLERGIES   Duloxetine hcl, Influenza vaccines, and Venlafaxine     REVIEW OF SYSTEMS    Review of Systems:  Gen:  Denies  fever, sweats, chills weigh loss  HEENT: Denies blurred vision, double vision, ear pain, eye pain, hearing loss, nose bleeds, sore throat Cardiac:  No dizziness, chest pain or heaviness, chest tightness,edema Resp:   Denies cough or sputum porduction, less shortness of breath,still wheezing, hemoptysis,  Gi: Denies swallowing difficulty, stomach pain, nausea or vomiting, diarrhea, constipation, bowel incontinence Gu:  Denies bladder incontinence, burning urine Ext:   Denies Joint pain, stiffness or swelling Skin: Denies  skin rash, easy bruising or bleeding or hives Endoc:  Denies polyuria, polydipsia , polyphagia or weight change Psych:   Denies depression, insomnia or hallucinations   Other:  All other systems negative   VS: BP  (!) 188/85 (BP Location: Right Arm)   Pulse 83   Temp 97.6 F (36.4 C)   Resp 18   Ht 5\' 2"  (1.575 m)   Wt 53.1 kg   SpO2 98%   BMI 21.41 kg/m      PHYSICAL EXAM    GENERAL:NAD, no fevers, chills, no weakness no fatigue HEAD: Normocephalic, atraumatic.  EYES: Pupils equal, round, reactive to light. Extraocular muscles intact. No scleral icterus.  MOUTH: Moist mucosal membrane. Dentition intact. No abscess noted.  EAR, NOSE, THROAT: Clear without exudates. No external lesions.  NECK: Supple. No thyromegaly. No nodules. No JVD.  PULMONARY: Diffuse  +wheezes CARDIOVASCULAR: S1 and S2. Regular rate and rhythm. No murmurs, rubs, or gallops. No edema. Pedal pulses  2+ bilaterally.  GASTROINTESTINAL: Soft, nontender, nondistended. No masses. Positive bowel sounds. No hepatosplenomegaly.  MUSCULOSKELETAL: No swelling, clubbing, or edema. Range of motion full in all extremities.  NEUROLOGIC: Cranial nerves II through XII are intact. No gross focal neurological deficits. Sensation intact. Reflexes intact.  SKIN: No ulceration, lesions, rashes, or cyanosis. Skin warm and dry. Turgor intact.  PSYCHIATRIC: Mood, affect within normal limits. The patient is awake, alert and oriented x 3. Insight, judgment intact.       IMAGING    DG Chest Portable 1 View  Result Date: 03/14/2022 CLINICAL DATA:  Shortness of breath EXAM: PORTABLE CHEST 1 VIEW COMPARISON:  11/26/2020 FINDINGS: Left-sided implanted loop recorder device. The heart size and mediastinal contours are within normal limits. Aortic atherosclerosis. Patchy airspace opacity throughout the right lower lobe. Left lung is clear. No pleural effusion or pneumothorax. IMPRESSION: Patchy airspace opacity throughout the right lower lobe suspicious for pneumonia. Radiographic follow-up to resolution is recommended. Electronically Signed   By: Davina Poke D.O.   On: 03/14/2022 13:11      ASSESSMENT/PLAN   This is an 23 ye old lady here  with RLL  pneumonia, stage III copd, still wheezing, oxygen desaturation prior to admission.  -oxygen augmentation,keep sats around 92-94 % -duo nebs and dulera and budenoside 0.5 bid via neb -steroids ( prednisone 40 mg q day -agree with present antibiotics -dvt prophylaxis -out patient f/u post d/c -further orders per above         Thank you for allowing me to participate in the care of this patient.   Patient/Family are satisfied with care plan and all questions have been answered.      Wallene Huh, M.D.  Division of [place]

## 2022-03-15 NOTE — Hospital Course (Signed)
86 y.o. female with medical history significant for CVA, COPD, depression, coronary artery disease, DVT admitted for acute hypoxic respiratory failure due to COPD exacerbation and right lower lobe pneumonia  8/8: Wean oxygen as able, pulmonary consult

## 2022-03-15 NOTE — Progress Notes (Signed)
  Progress Note   Patient: Stephanie [name] WTU:[id number] DOB: Jan 11, 1936 DOA: 03/14/2022     1 DOS: the patient was seen and examined on 03/15/2022   Brief hospital course: 86 y.o. female with medical history significant for CVA, COPD, depression, coronary artery disease, DVT admitted for acute hypoxic respiratory failure due to COPD exacerbation and right lower lobe pneumonia  8/8: Wean oxygen as able, pulmonary consult   Assessment and Plan: * Lobar pneumonia ([place]) Patient presents to the ER for evaluation of worsening shortness of breath from her baseline associated with a dry cough and wheezing. Imaging shows right lower lobe infiltrate Concern for possible aspiration pneumonia Continue IV Unasyn for now.  Appreciate pulmonary and speech therapy input  Acute respiratory failure with hypoxia (HCC) Most likely secondary to acute COPD exacerbation as well as lobar pneumonia Patient had room air pulse oximetry of 84% and is currently on 2 L of oxygen Will need to be assessed for home oxygen need prior to discharge Continue oxygen supplementation to maintain pulse oximetry greater than 92% Pulmonary following  History of stroke Patient with a history of prior CVA.  Continue Eliquis.  Continue Norvasc Hold metoprolol due to relative hypotension.  Resume as blood pressure improves  Acute exacerbation of chronic obstructive pulmonary disease (COPD) ([place]) Patient with a known history of COPD who presents for evaluation of worsening shortness of breath from baseline as well as wheezing Place patient on scheduled and as needed bronchodilator therapy Continue inhaled and systemic steroids  DVT (deep venous thrombosis) (HCC) Continue apixaban  Depression Continue Paxil  CKD (chronic kidney disease) stage 3, GFR 30-59 ml/min (HCC) Stable         Subjective: Still quite short of breath, coughing  Physical Exam: Vitals:   03/15/22 0750 03/15/22 0812 03/15/22 1412 03/15/22  1600  BP: (!) 188/85  136/67 129/71  Pulse: 95 83 83 83  Resp: 16 18  18   Temp: 97.6 F (36.4 C)   97.8 F (36.6 C)  TempSrc:      SpO2: 97% 98%  98%  Weight:      Height:       86 year old cachectic looking female lying in the bed in mild respiratory distress Lungs expiratory wheezing throughout both lungs Cardiovascular regular rate and rhythm Abdomen soft, benign Neuro alert and oriented, nonfocal Psych normal mood and affect Data Reviewed:  Lactic acid 2.8, hemoglobin 9.5  Family Communication: Daughter updated at bedside  Disposition: Status is: Inpatient Remains inpatient appropriate because: COPD management, weaning oxygen   Planned Discharge Destination: Home with Home Health    DVT prophylaxis-Eliquis Time spent: 35 minutes  Author: Max Sane, MD 03/15/2022 5:02 PM  For on call review [email or web address].

## 2022-03-15 NOTE — TOC Initial Note (Signed)
Transition of Care [facility]) - Initial/Assessment Note    Patient Details  Name: Stephanie [name] MRN: [id number] Date of Birth: April 03, 1936  Transition of Care [facility]) CM/SW Contact:    Beverly Sessions, RN Phone Number: 03/15/2022, 9:51 AM  Clinical Narrative:                  Admitted for: PNA, COPD Admitted from: home alone.   Daughter Peter [place] states that one of the patient's children check on her daily, and ensure she takes her medication and has food  CBJ:SEGBT.  Children transport to appointments Pharmacy: Denies issues obtaining home medication Current home health/prior home health/DME: Cane and nebulizer.  Has access to a rollator, but does not use it  Patient and family in agreement to delivery of home o2. Referral was sent to [facility] by Dr Leane Platt office. Per Zack with [facility] order and sats from outpatient are still valid.  At discharge notify Zack and portable tank to be delivered to room for transport home.  One of patient's children will transport at discharge         Patient Goals and CMS Choice        Expected Discharge Plan and Services                                                Prior Living Arrangements/Services                       Activities of Daily Living Home Assistive Devices/Equipment: Oxygen ADL Screening (condition at time of admission) Patient's cognitive ability adequate to safely complete daily activities?: Yes Is the patient deaf or have difficulty hearing?: No Does the patient have difficulty seeing, even when wearing glasses/contacts?: No Does the patient have difficulty concentrating, remembering, or making decisions?: No Patient able to express need for assistance with ADLs?: Yes Does the patient have difficulty dressing or bathing?: No Independently performs ADLs?: Yes (appropriate for developmental age) Does the patient have difficulty walking or climbing stairs?: No Weakness of Legs: None Weakness of  Arms/Hands: None  Permission Sought/Granted                  Emotional Assessment              Admission diagnosis:  Lobar pneumonia ([place]) [J18.1] Acute respiratory failure with hypoxia ([place]) [J96.01] Pneumonia of right lower lobe due to infectious organism [J18.9] Patient Active Problem List   Diagnosis Date Noted   Lobar pneumonia ([place]) 03/14/2022   Acute respiratory failure with hypoxia ([place]) 03/14/2022   CKD (chronic kidney disease) stage 3, GFR 30-59 ml/min ([place]) 03/14/2022   CAP (community acquired pneumonia) 09/15/2017   Sepsis ([place]) 09/12/2017   History of stroke 09/06/2017   Essential hypertension 04/14/2017   PFO (patent foramen ovale)    DVT (deep venous thrombosis) ([place])    Cerebrovascular accident (CVA) due to embolism of left middle cerebral artery ([place])    Expressive aphasia    Elevated troponin 08/18/2016   Mild malnutrition ([place]) 08/18/2016   Acute CVA (cerebrovascular accident) ([place]) 08/17/2016   Acute exacerbation of chronic obstructive pulmonary disease (COPD) ([place]) 07/19/2016   Depression 05/03/2016   Mild dementia ([place]) 04/19/2016   Anxiety and depression 03/04/2015   Arteriosclerosis of coronary artery 03/04/2015   CAFL (chronic airflow limitation) ([place]) 03/04/2015  Breathlessness on exertion 03/04/2015   HLD (hyperlipidemia) 03/04/2015   Osteopenia 03/04/2015   Peptic ulcer 03/04/2015   Amnesia 03/20/2014   Disordered sleep 03/20/2014   Allergy to environmental factors 11/14/2013   Gastroduodenal ulcer 11/14/2013   GI bleed 11/14/2013   PCP:  Adin Hector, MD Pharmacy:   [id number] - [place], [address] [place] [address] Phone: [phone] Fax: [phone]     Social Determinants of Health (SDOH) Interventions    Readmission Risk Interventions     No data to display

## 2022-03-16 DIAGNOSIS — J181 Lobar pneumonia, unspecified organism: Secondary | ICD-10-CM | POA: Diagnosis not present

## 2022-03-16 LAB — BASIC METABOLIC PANEL
Anion gap: 8 (ref 5–15)
BUN: 30 mg/dL — ABNORMAL HIGH (ref 8–23)
CO2: 24 mmol/L (ref 22–32)
Calcium: 9.4 mg/dL (ref 8.9–10.3)
Chloride: 110 mmol/L (ref 98–111)
Creatinine, Ser: 1.41 mg/dL — ABNORMAL HIGH (ref 0.44–1.00)
GFR, Estimated: 37 mL/min — ABNORMAL LOW (ref >60)
Glucose, Bld: 135 mg/dL — ABNORMAL HIGH (ref 70–99)
Potassium: 4.3 mmol/L (ref 3.5–5.1)
Sodium: 142 mmol/L (ref 135–145)

## 2022-03-16 LAB — CBC
HCT: 33.3 % — ABNORMAL LOW (ref 36.0–46.0)
Hemoglobin: 10.7 g/dL — ABNORMAL LOW (ref 12.0–15.0)
MCH: 30.3 pg (ref 26.0–34.0)
MCHC: 32.1 g/dL (ref 30.0–36.0)
MCV: 94.3 fL (ref 80.0–100.0)
Platelets: 384 10*3/uL (ref 150–400)
RBC: 3.53 MIL/uL — ABNORMAL LOW (ref 3.87–5.11)
RDW: 14.2 % (ref 11.5–15.5)
WBC: 16.9 10*3/uL — ABNORMAL HIGH (ref 4.0–10.5)
nRBC: 0 % (ref 0.0–0.2)

## 2022-03-16 NOTE — Assessment & Plan Note (Signed)
Imaging shows right lower lobe infiltrate, concern for possible aspiration pneumonia  Continue IV Unasyn for now.   Appreciate pulmonary and speech therapy input

## 2022-03-16 NOTE — TOC Progression Note (Signed)
Transition of Care (TOC) - Progression Note    Patient Details  Name: Stephanie [name] MRN: [id number] Date of Birth: 02/26/1936  Transition of Care (TOC) CM/SW Contact   T , RN Phone Number: 03/16/2022, 4:17 PM  Clinical Narrative:     Met with patient and daughter at bedside Portable O2 tank delivered to bedside for discharge Therapy recommending home health. Patient agreeable. Daughters preference is [facility]  [facility] unable to accept.  Referral sent to Meg with [facility] - awaiting response if they can accept   RW to be delivered to room prior to discharge     Expected Discharge Plan and Services                                                 Social Determinants of Health (SDOH) Interventions    Readmission Risk Interventions     No data to display          

## 2022-03-16 NOTE — Assessment & Plan Note (Signed)
   Continue Eliquis.    Continue Norvasc  Hold metoprolol due to relative hypotension.  Resume as blood pressure improves

## 2022-03-16 NOTE — Assessment & Plan Note (Signed)
Most likely secondary to acute COPD exacerbation as well as lobar pneumonia. Patient had room air pulse oximetry of 84% and is currently on 2 L of oxygen  Will need to be assessed for home oxygen need prior to discharge  Continue oxygen supplementation to maintain pulse oximetry greater than 92%  Pulmonary following

## 2022-03-16 NOTE — Progress Notes (Signed)
Pulmonary Medicine          Date: 03/16/2022,   MRN# [id number] Stephanie [name] [birth year]       HISTORY OF PRESENT ILLNESS   Feeling better and stronger. Less wheezing. Anticipating going home in am. On eliquis, not bleeding. Home oxygen being set up.   PAST MEDICAL HISTORY   Past Medical History:  Diagnosis Date   Arthritis    Baker's cyst of knee, right    a. 08/2016 U/S: Baker's cyst noted in R popliteal fossa.   Complication of anesthesia    COPD (chronic obstructive pulmonary disease) ([place])    a. Home O2 started 08/2017.   Coronary artery disease    a. prior h/o stenting ~ 2005.   Depression    Edema    History of cardiac monitoring    a. 08/2016 s/p LINQ placement following CVA. No AFib to date (09/2017).   History of pneumonia    Hx of ischemic left MCA stroke    a. 08/2016 large posterior L MCA territory infarct in the setting of LLE DVT and suggestion of PFO on transcranial dopplers w/ bubble study (no PFO reported on echocardiogram)-->Eliquis.   Hypertension    Left leg DVT ([place])    a. 08/2016 U/S: subacute DVT in prox L popliteal vein w/ chronic superficial thrombosis in the L greater saphenous vein from the mid to prox L calf-->Eliquis.   PFO (patent foramen ovale)    a. 08/2016 Abnl TCD w/ bubble study suggesting large PFO. Not reported on echocardiogram.   PONV (postoperative nausea and vomiting)      SURGICAL HISTORY   Past Surgical History:  Procedure Laterality Date   BALLOON ANGIOPLASTY, ARTERY  05/17/2009   CATARACT EXTRACTION W/PHACO Left 05/24/2016   Procedure: CATARACT EXTRACTION PHACO AND INTRAOCULAR LENS PLACEMENT (IOC);  Surgeon: Birder Robson, MD;  Location: [facility];  Service: Ophthalmology;  Laterality: Left;  Lot# [id number] H [place]:   00:52.3 AP%:   27.3 CDE:  14.26    CATARACT EXTRACTION W/PHACO Right 06/14/2016   Procedure: CATARACT EXTRACTION PHACO AND INTRAOCULAR LENS PLACEMENT ([place]);  Surgeon: Birder Robson, MD;   Location: [facility];  Service: Ophthalmology;  Laterality: Right;  Lot# [id number] H [place]: 01:13.4 AP%: 24.4 CDE: 17.85   CORONARY ANGIOPLASTY     STENT   EP IMPLANTABLE DEVICE N/A 08/19/2016   Procedure: Loop Recorder Insertion;  Surgeon: Will Meredith Leeds, MD;  Location: [facility];  Service: Cardiovascular;  Laterality: N/A;     FAMILY HISTORY   Family History  Problem Relation Age of Onset   Hypertension Mother    Diabetes Mother    Pancreatic cancer Mother 1   Cancer Mother    Suicidality Father 17   Hypertension Father    Alcohol abuse Father    Hypertension Sister    Heart disease Sister    Kidney disease Sister    Varicose Veins Sister    Parkinson's disease Sister      SOCIAL HISTORY   Social History   Tobacco Use   Smoking status: Former    Types: Cigarettes    Quit date: 08/07/2001    Years since quitting: 20.6   Smokeless tobacco: Never  Vaping Use   Vaping Use: Never used  Substance Use Topics   Alcohol use: No    Alcohol/week: 0.0 standard drinks of alcohol   Drug use: No     MEDICATIONS    Home Medication:    Current Medication:  Current Facility-Administered Medications:    acetaminophen (TYLENOL) tablet 1,000 mg, 1,000 mg, Oral, BID, Agbata, Tochukwu, MD, 1,000 mg at 03/16/22 0835   albuterol (PROVENTIL) (2.5 MG/3ML) 0.083% nebulizer solution 2.5 mg, 2.5 mg, Nebulization, Q2H PRN, Agbata, Tochukwu, MD, 2.5 mg at 03/15/22 2011   amLODipine (NORVASC) tablet 5 mg, 5 mg, Oral, Daily, Manuella Ghazi, Vipul, MD, 5 mg at 03/16/22 0836   Ampicillin-Sulbactam (UNASYN) 3 g in sodium chloride 0.9 % 100 mL IVPB, 3 g, Intravenous, Q12H, Nazari, Walid A, RPH, Last Rate: 200 mL/hr at 03/16/22 0839, 3 g at 03/16/22 0839   apixaban (ELIQUIS) tablet 2.5 mg, 2.5 mg, Oral, BID, Agbata, Tochukwu, MD, 2.5 mg at 03/16/22 0836   docusate sodium (COLACE) capsule 100 mg, 100 mg, Oral, QHS, Agbata, Tochukwu, MD, 100 mg at 03/15/22 2206   gabapentin (NEURONTIN) capsule  100 mg, 100 mg, Oral, BID, Agbata, Tochukwu, MD, 100 mg at 03/16/22 0835   mometasone-formoterol (DULERA) 200-5 MCG/ACT inhaler 2 puff, 2 puff, Inhalation, BID, Max Sane, MD, 2 puff at 03/16/22 0836   montelukast (SINGULAIR) tablet 10 mg, 10 mg, Oral, QHS, Agbata, Tochukwu, MD, 10 mg at 03/15/22 2206   multivitamin with minerals tablet 1 tablet, 1 tablet, Oral, Daily, Agbata, Tochukwu, MD, 1 tablet at 03/16/22 0836   ondansetron (ZOFRAN) tablet 4 mg, 4 mg, Oral, Q6H PRN **OR** ondansetron (ZOFRAN) injection 4 mg, 4 mg, Intravenous, Q6H PRN, Agbata, Tochukwu, MD   pantoprazole (PROTONIX) EC tablet 40 mg, 40 mg, Oral, Daily, Agbata, Tochukwu, MD, 40 mg at 03/16/22 7846   PARoxetine (PAXIL) tablet 10 mg, 10 mg, Oral, Daily, Agbata, Tochukwu, MD, 10 mg at 03/16/22 9629   PARoxetine (PAXIL) tablet 20 mg, 20 mg, Oral, Daily, Agbata, Tochukwu, MD, 20 mg at 03/15/22 2205   predniSONE (DELTASONE) tablet 40 mg, 40 mg, Oral, Q breakfast, Max Sane, MD, 40 mg at 03/16/22 0836   tiotropium (SPIRIVA) inhalation capsule ([facility] use ONLY) 18 mcg, 1 capsule, Inhalation, Daily, Max Sane, MD, 18 mcg at 03/16/22 0848    ALLERGIES   Duloxetine hcl, Influenza vaccines, and Venlafaxine     REVIEW OF SYSTEMS    Review of Systems:  Gen:  Denies  fever, sweats, chills weigh loss  HEENT: Denies blurred vision, double vision, ear pain, eye pain, hearing loss, nose bleeds, sore throat Cardiac:  No dizziness, chest pain or heaviness, chest tightness,edema Resp:   Denies cough or sputum porduction,  less shortness of breath,less wheezing, no hemoptysis,  Gi: Denies swallowing difficulty, stomach pain, nausea or vomiting, diarrhea, constipation, bowel incontinence Gu:  Denies bladder incontinence, burning urine Ext:   Denies Joint pain, stiffness or swelling Skin: Denies  skin rash, easy bruising or bleeding or hives Endoc:  Denies polyuria, polydipsia , polyphagia or weight change Psych:   Denies  depression, insomnia or hallucinations   Other:  All other systems negative   VS: BP 132/71 (BP Location: Right Arm)   Pulse 86   Temp 98.6 F (37 C) (Oral)   Resp 16   Ht 5\' 2"  (1.575 m)   Wt 53.1 kg   SpO2 97%   BMI 21.41 kg/m      PHYSICAL EXAM    GENERAL:NAD, no fevers, chills, no weakness no fatigue HEAD: Normocephalic, atraumatic.  EYES: Pupils equal, round, reactive to light. Extraocular muscles intact. No scleral icterus.  MOUTH: Moist mucosal membrane. Dentition intact. No abscess noted.  EAR, NOSE, THROAT: Clear without exudates. No external lesions.  NECK: Supple. No thyromegaly. No nodules.  No JVD.  PULMONARY: much less wheezing this am CARDIOVASCULAR: S1 and S2. Regular rate and rhythm. No murmurs, rubs, or gallops. No edema. Pedal pulses 2+ bilaterally.  GASTROINTESTINAL: Soft, nontender, nondistended. No masses. Positive bowel sounds. No hepatosplenomegaly.  MUSCULOSKELETAL: No swelling, clubbing, or edema. Range of motion full in all extremities.  NEUROLOGIC: Cranial nerves II through XII are intact. No gross focal neurological deficits. Sensation intact. Reflexes intact.  SKIN: No ulceration, lesions, rashes, or cyanosis. Skin warm and dry. Turgor intact.  PSYCHIATRIC: Mood, affect within normal limits. The patient is awake, alert and oriented x 3. Insight, judgment intact.       IMAGING    DG Chest Portable 1 View  Result Date: 03/14/2022 CLINICAL DATA:  Shortness of breath EXAM: PORTABLE CHEST 1 VIEW COMPARISON:  11/26/2020 FINDINGS: Left-sided implanted loop recorder device. The heart size and mediastinal contours are within normal limits. Aortic atherosclerosis. Patchy airspace opacity throughout the right lower lobe. Left lung is clear. No pleural effusion or pneumothorax. IMPRESSION: Patchy airspace opacity throughout the right lower lobe suspicious for pneumonia. Radiographic follow-up to resolution is recommended. Electronically Signed   By: Davina Poke D.O.   On: 03/14/2022 13:11      ASSESSMENT/PLAN   This is an 28 ye old lady here with RLL  pneumonia, clinically improving. stage III copd, much less  wheezing, oxygen desaturation prior to admission, to go home on oxygen with out patient f/u in pulmonary in 1 week with repeat cxr  -oxygen augmentation 2 liters at rest, 3 liters with activity,keep sats around 92-94 % -resume trelegy one puff q day, albuterol 2 puffs qid prn, singulair 10 mg q hs -pred taper -omeprazole 20 mg q day -augmentin 500 mg q 12 hrs x 6 days -further orders per above         Thank you for allowing me to participate in the care of this patient.   Patient/Family are satisfied with care plan and all questions have been answered.  This document was prepared using Dragon voice recognition software and may include unintentional dictation errors.     Wallene Huh, M.D.  Division of [place]

## 2022-03-16 NOTE — Assessment & Plan Note (Signed)
Continue apixaban 

## 2022-03-16 NOTE — Assessment & Plan Note (Signed)
   Stable  Trend BMP

## 2022-03-16 NOTE — Assessment & Plan Note (Signed)
Patient with a known history of COPD who presents for evaluation of worsening shortness of breath from baseline as well as wheezing  Place patient on scheduled and as needed bronchodilator therapy  Continue inhaled and systemic steroids  Pneumonia tx as above

## 2022-03-16 NOTE — Evaluation (Signed)
Physical Therapy Evaluation Patient Details Name: Ilianna Bown MRN: [id number] DOB: 05-10-1936 Today's Date: 03/16/2022  History of Present Illness  Patient is a 86 y.o. female with medical history significant for CVA, COPD, depression, coronary artery disease, DVT. Admitted for acute hypoxic respiratory failure due to COPD exacerbation and right lower lobe pneumonia  Clinical Impression  Patient is agreeable to PT. Two family members at the bedside. The patient reports she ambulates with a cane at baseline and lives at home alone.  The patient was able to get out of bed without physical assistance. She ambulated a short distance with the cane in the room with narrow base of support. She had improved step length and steadiness using the rolling walker, however she declined the walker for home at this time. She ambulated the lap around the nursing station without difficulty. Mild dyspnea towards the end of walk with quick recovery. The patient is hopeful to discharge home soon with the support of her family. Recommend HHPT at discharge. Recommend to continue PT while in the hospital to maximize independence and facilitate return to prior level of function.      Recommendations for follow up therapy are one component of a multi-disciplinary discharge planning process, led by the attending physician.  Recommendations may be updated based on patient status, additional functional criteria and insurance authorization.  Follow Up Recommendations Home health PT      Assistance Recommended at Discharge Set up Supervision/Assistance  Patient can return home with the following  Help with stairs or ramp for entrance;Assist for transportation    Equipment Recommendations Rolling walker (2 wheels) (rolling walker recommended, however patient does not currently want one. did not order DME today)  Recommendations for Other Services       Functional Status Assessment Patient has had a recent decline  in their functional status and demonstrates the ability to make significant improvements in function in a reasonable and predictable amount of time.     Precautions / Restrictions Precautions Precautions: Fall Restrictions Weight Bearing Restrictions: No      Mobility  Bed Mobility Overal bed mobility: Modified Independent                  Transfers Overall transfer level: Needs assistance Equipment used: Straight cane Transfers: Sit to/from Stand Sit to Stand: Supervision           General transfer comment: supervision for safety    Ambulation/Gait Ambulation/Gait assistance: Min guard, Supervision Gait Distance (Feet): 175 Feet Assistive device: Rolling walker (2 wheels), Straight cane Gait Pattern/deviations: Step-through pattern, Narrow base of support Gait velocity: decreased     General Gait Details: the patient ambulated 20 ft with her own cane with narrow base of support. recommended to trial rolling walker and patient demonstrated improved base of support and step length. the patient is heasitant to use rolling walker at home and family requested to trial using the cane again for home use. patient had mild shortenss of breath with exertion towards the end of the walk but recovered quickly.  Stairs            Wheelchair Mobility    Modified Rankin (Stroke Patients Only)       Balance Overall balance assessment: Needs assistance Sitting-balance support: Feet supported, No upper extremity supported Sitting balance-Leahy Scale: Good     Standing balance support: Bilateral upper extremity supported, During functional activity Standing balance-Leahy Scale: Fair Standing balance comment: with UE supported on rolling walker. no loss of  balance with brief no UE support                             Pertinent Vitals/Pain Pain Assessment Pain Assessment: No/denies pain    Home Living Family/patient expects to be discharged to::  Private residence Living Arrangements: Alone Available Help at Discharge: Family Type of Home: Apartment Home Access: Level entry       Home Layout: One level Home Equipment: Cane - single point Additional Comments: patient reports she had home 02    Prior Function Prior Level of Function : Independent/Modified Independent;Patient poor historian/Family not available (family in the room were not her primary caregivers and were unsure of mobility or DME)             Mobility Comments: using cane for ambulation. family also report patient uses furniture for support at times in her home. ADLs Comments: patient first indicated she is independent but then vaguely reports she had assistance with bathing     Hand Dominance        Extremity/Trunk Assessment   Upper Extremity Assessment Upper Extremity Assessment: Generalized weakness    Lower Extremity Assessment Lower Extremity Assessment: Generalized weakness       Communication   Communication: Expressive difficulties  Cognition Arousal/Alertness: Awake/alert Behavior During Therapy: WFL for tasks assessed/performed Overall Cognitive Status: Difficult to assess                                 General Comments: patient is able to follow single step commands consistently.  expressive deficits make it difficult to assess orientation.        General Comments      Exercises     Assessment/Plan    PT Assessment Patient needs continued PT services  PT Problem List Decreased strength;Decreased activity tolerance;Decreased balance;Decreased mobility;Decreased safety awareness;Decreased knowledge of use of DME       PT Treatment Interventions DME instruction;Gait training;Stair training;Functional mobility training;Therapeutic activities;Therapeutic exercise;Balance training;Neuromuscular re-education;Cognitive remediation;Patient/family education    PT Goals (Current goals can be found in the Care Plan  section)  Acute Rehab PT Goals Patient Stated Goal: to go home PT Goal Formulation: With patient/family (nieces at the bedside) Time For Goal Achievement: 03/30/22 Potential to Achieve Goals: Fair    Frequency Min 2X/week     Co-evaluation               AM-PAC PT "6 Clicks" Mobility  Outcome Measure Help needed turning from your back to your side while in a flat bed without using bedrails?: None Help needed moving from lying on your back to sitting on the side of a flat bed without using bedrails?: None Help needed moving to and from a bed to a chair (including a wheelchair)?: A Little Help needed standing up from a chair using your arms (e.g., wheelchair or bedside chair)?: A Little Help needed to walk in hospital room?: A Little Help needed climbing 3-5 steps with a railing? : A Little 6 Click Score: 20    End of Session Equipment Utilized During Treatment: Gait belt;Oxygen Activity Tolerance: Patient tolerated treatment well Patient left: in bed;with call bell/phone within reach;with bed alarm set;with family/visitor present Nurse Communication: Mobility status PT Visit Diagnosis: Unsteadiness on feet (R26.81);Muscle weakness (generalized) (M62.81)    Time: 1000-1024 PT Time Calculation (min) (ACUTE ONLY): 24 min   Charges:   PT  Evaluation $PT Eval Low Complexity: 1 Low PT Treatments $Gait Training: 8-22 mins        Minna Merritts, PT, MPT   Percell Locus 03/16/2022, 2:11 PM

## 2022-03-16 NOTE — Progress Notes (Signed)
PROGRESS NOTE    Stephanie [name]   FAO:[id number] DOB: May 05, 1936  DOA: 03/14/2022 Date of Service: 03/16/22 PCP: Adin Hector, MD     Brief Narrative / Hospital Course:  86 y.o. female with medical history significant for CVA, COPD, depression, coronary artery disease, DVT admitted 03/14/2022 for acute hypoxic respiratory failure due to COPD exacerbation and right lower lobe pneumonia 8/7: Received azithromycin and cefepime started by ED, switched to started ampicillin-sulbactam 3 g every 12 hours, 8/8: Wean oxygen as able, pulmonary consult -Dr. Raul Del saw the patient: O2 keep sats around 92 to 94%, continue DuoNebs Dulera budesonide prednisone and current antibiotics with ampicillin-sulbactam 8/9: SpO2 100% 2 L [place], WBC trending up due to steroids. Awaiting home O2 delivery, should be done by tomorrow    Consultants:  Pulmonary   Procedures: none    Subjective: Patient reports still coughing and mild SOB but overall better / improved.      ASSESSMENT & PLAN:   Principal Problem:   Lobar pneumonia ([employer]) Active Problems:   Acute respiratory failure with hypoxia (HCC)   History of stroke   Acute exacerbation of chronic obstructive pulmonary disease (COPD) (HCC)   DVT (deep venous thrombosis) (HCC)   Depression   CKD (chronic kidney disease) stage 3, GFR 30-59 ml/min (HCC)    Lobar pneumonia (HCC) Imaging shows right lower lobe infiltrate, concern for possible aspiration pneumonia Continue IV Unasyn for now.  Appreciate pulmonary and speech therapy input  History of stroke Continue Eliquis.   Continue Norvasc Hold metoprolol due to relative hypotension.  Resume as blood pressure improves  DVT (deep venous thrombosis) (HCC) Continue apixaban  Depression Continue Paxil  CKD (chronic kidney disease) stage 3, GFR 30-59 ml/min (HCC) Stable Trend BMP  Acute respiratory failure with hypoxia (HCC) Most likely secondary to acute COPD exacerbation as  well as lobar pneumonia. Patient had room air pulse oximetry of 84% and is currently on 2 L of oxygen Will need to be assessed for home oxygen need prior to discharge Continue oxygen supplementation to maintain pulse oximetry greater than 92% Pulmonary following  Acute exacerbation of chronic obstructive pulmonary disease (COPD) ([place]) Patient with a known history of COPD who presents for evaluation of worsening shortness of breath from baseline as well as wheezing Place patient on scheduled and as needed bronchodilator therapy Continue inhaled and systemic steroids Pneumonia tx as above     DVT prophylaxis: Eliquis Code Status: DNR Family Communication: will call later today  Disposition Plan / TOC needs: inpatient  Barriers to discharge / significant pending items: hoopefully d/c tomorrow once home O2 delivered              Objective: Vitals:   03/15/22 1600 03/15/22 2003 03/16/22 0417 03/16/22 0810  BP: 129/71 (!) 169/77 (!) 149/75 (!) 146/74  Pulse: 83 100 79 85  Resp: 18 20 20 16   Temp: 97.8 F (36.6 C) 98.9 F (37.2 C) 97.9 F (36.6 C) 98.2 F (36.8 C)  TempSrc:  Oral Oral Oral  SpO2: 98% 97% 100% 96%  Weight:      Height:        Intake/Output Summary (Last 24 hours) at 03/16/2022 1536 Last data filed at 03/16/2022 1433 Gross per 24 hour  Intake 240 ml  Output --  Net 240 ml   Filed Weights   03/14/22 1600  Weight: 53.1 kg    Examination:  Constitutional:  VS as above Neck: No masses, trachea midline Respiratory: Normal respiratory  effort + diffuse mild wheeze No rhonchi No rales Cardiovascular: S1/S2 normal No murmur No lower extremity edema Gastrointestinal: No tenderness Musculoskeletal:  No clubbing/cyanosis of digits Symmetrical movement in all extremities Neurological: No cranial nerve deficit on limited exam Alert Psychiatric: Normal judgment/insight Normal mood and affect       Scheduled Medications:   acetaminophen   1,000 mg Oral BID   amLODipine  5 mg Oral Daily   apixaban  2.5 mg Oral BID   docusate sodium  100 mg Oral QHS   gabapentin  100 mg Oral BID   mometasone-formoterol  2 puff Inhalation BID   montelukast  10 mg Oral QHS   multivitamin with minerals  1 tablet Oral Daily   pantoprazole  40 mg Oral Daily   PARoxetine  10 mg Oral Daily   PARoxetine  20 mg Oral Daily   predniSONE  40 mg Oral Q breakfast   tiotropium  1 capsule Inhalation Daily    Continuous Infusions:  ampicillin-sulbactam (UNASYN) IV 3 g (03/16/22 0839)    PRN Medications:  albuterol, ondansetron **OR** ondansetron (ZOFRAN) IV  Antimicrobials:  Anti-infectives (From admission, onward)    Start     Dose/Rate Route Frequency Ordered Stop   03/14/22 2200  Ampicillin-Sulbactam (UNASYN) 3 g in sodium chloride 0.9 % 100 mL IVPB        3 g 200 mL/hr over 30 Minutes Intravenous Every 12 hours 03/14/22 1620     03/14/22 1330  ceFEPIme (MAXIPIME) 2 g in sodium chloride 0.9 % 100 mL IVPB        2 g 200 mL/hr over 30 Minutes Intravenous  Once 03/14/22 1325 03/14/22 1537   03/14/22 1330  azithromycin (ZITHROMAX) 500 mg in sodium chloride 0.9 % 250 mL IVPB        500 mg 250 mL/hr over 60 Minutes Intravenous  Once 03/14/22 1325 03/16/22 0255       Data Reviewed: I have personally reviewed following labs and imaging studies  CBC: Recent Labs  Lab 03/14/22 1242 03/15/22 0426 03/16/22 0435  WBC 16.1* 9.1 16.9*  NEUTROABS 13.4*  --   --   HGB 10.0* 9.5* 10.7*  HCT 31.1* 29.1* 33.3*  MCV 93.7 93.6 94.3  PLT 310 288 109   Basic Metabolic Panel: Recent Labs  Lab 03/14/22 1242 03/15/22 0426 03/16/22 0435  NA 135 137 142  K 3.9 4.5 4.3  CL 103 107 110  CO2 22 24 24   GLUCOSE 148* 169* 135*  BUN 29* 28* 30*  CREATININE 1.54* 1.34* 1.41*  CALCIUM 8.8* 8.9 9.4   GFR: Estimated Creatinine Clearance: 23.5 mL/min (A) (by C-G formula based on SCr of 1.41 mg/dL (H)). Liver Function Tests: Recent Labs  Lab  03/14/22 1242  AST 15  ALT 14  ALKPHOS 80  BILITOT 0.5  PROT 6.7  ALBUMIN 3.2*   No results for input(s): "LIPASE", "AMYLASE" in the last 168 hours. No results for input(s): "AMMONIA" in the last 168 hours. Coagulation Profile: No results for input(s): "INR", "PROTIME" in the last 168 hours. Cardiac Enzymes: No results for input(s): "CKTOTAL", "CKMB", "CKMBINDEX", "TROPONINI" in the last 168 hours. BNP (last 3 results) No results for input(s): "PROBNP" in the last 8760 hours. HbA1C: No results for input(s): "HGBA1C" in the last 72 hours. CBG: No results for input(s): "GLUCAP" in the last 168 hours. Lipid Profile: No results for input(s): "CHOL", "HDL", "LDLCALC", "TRIG", "CHOLHDL", "LDLDIRECT" in the last 72 hours. Thyroid Function Tests: No results for  input(s): "TSH", "T4TOTAL", "FREET4", "T3FREE", "THYROIDAB" in the last 72 hours. Anemia Panel: No results for input(s): "VITAMINB12", "FOLATE", "FERRITIN", "TIBC", "IRON", "RETICCTPCT" in the last 72 hours. Urine analysis:    Component Value Date/Time   COLORURINE YELLOW (A) 09/12/2017 1659   APPEARANCEUR HAZY (A) 09/12/2017 1659   APPEARANCEUR Clear 12/15/2016 1654   LABSPEC 1.011 09/12/2017 1659   PHURINE 5.0 09/12/2017 1659   GLUCOSEU NEGATIVE 09/12/2017 1659   HGBUR SMALL (A) 09/12/2017 1659   BILIRUBINUR NEGATIVE 09/12/2017 1659   BILIRUBINUR Negative 12/15/2016 1654   KETONESUR NEGATIVE 09/12/2017 1659   PROTEINUR NEGATIVE 09/12/2017 1659   NITRITE NEGATIVE 09/12/2017 1659   LEUKOCYTESUR NEGATIVE 09/12/2017 1659   LEUKOCYTESUR Negative 12/15/2016 1654   Sepsis Labs: @LABRCNTIP (procalcitonin:4,lacticidven:4)  Recent Results (from the past 240 hour(s))  SARS Coronavirus 2 by RT PCR (hospital order, performed in [place] hospital lab) *cepheid single result test* Anterior Nasal Swab     Status: None   Collection Time: 03/14/22 12:42 PM   Specimen: Anterior Nasal Swab  Result Value Ref Range Status   SARS  Coronavirus 2 by RT PCR NEGATIVE NEGATIVE Final    Comment: (NOTE) SARS-CoV-2 target nucleic acids are NOT DETECTED.  The SARS-CoV-2 RNA is generally detectable in upper and lower respiratory specimens during the acute phase of infection. The lowest concentration of SARS-CoV-2 viral copies this assay can detect is 250 copies / mL. A negative result does not preclude SARS-CoV-2 infection and should not be used as the sole basis for treatment or other patient management decisions.  A negative result may occur with improper specimen collection / handling, submission of specimen other than nasopharyngeal swab, presence of viral mutation(s) within the areas targeted by this assay, and inadequate number of viral copies (<250 copies / mL). A negative result must be combined with clinical observations, patient history, and epidemiological information.  Fact Sheet for Patients:   https://www.patel.info/  Fact Sheet for Healthcare Providers: https://hall.com/  This test is not yet approved or  cleared by the [place] FDA and has been authorized for detection and/or diagnosis of SARS-CoV-2 by FDA under an Emergency Use Authorization (EUA).  This EUA will remain in effect (meaning this test can be used) for the duration of the COVID-19 declaration under Section 564(b)(1) of the Act, 21 U.S.C. section 360bbb-3(b)(1), unless the authorization is terminated or revoked sooner.  Performed at [facility], [address] [place], [place] [address]   Blood culture (routine x 2)     Status: None (Preliminary result)   Collection Time: 03/14/22  2:10 PM   Specimen: BLOOD  Result Value Ref Range Status   Specimen Description BLOOD BLOOD LEFT WRIST  Final   Special Requests   Final    BOTTLES DRAWN AEROBIC AND ANAEROBIC Blood Culture adequate volume   Culture   Final    NO GROWTH 2 DAYS Performed at [facility], [address], [place],  [address]    Report Status PENDING  Incomplete  Blood culture (routine x 2)     Status: None (Preliminary result)   Collection Time: 03/14/22  2:10 PM   Specimen: BLOOD  Result Value Ref Range Status   Specimen Description BLOOD BLOOD RIGHT WRIST  Final   Special Requests   Final    BOTTLES DRAWN AEROBIC AND ANAEROBIC Blood Culture adequate volume   Culture   Final    NO GROWTH 2 DAYS Performed at [facility], [place], [place]  [address]    Report Status PENDING  Incomplete         Radiology Studies: DG Chest Portable 1 View  Result Date: 03/14/2022 CLINICAL DATA:  Shortness of breath EXAM: PORTABLE CHEST 1 VIEW COMPARISON:  11/26/2020 FINDINGS: Left-sided implanted loop recorder device. The heart size and mediastinal contours are within normal limits. Aortic atherosclerosis. Patchy airspace opacity throughout the right lower lobe. Left lung is clear. No pleural effusion or pneumothorax. IMPRESSION: Patchy airspace opacity throughout the right lower lobe suspicious for pneumonia. Radiographic follow-up to resolution is recommended. Electronically Signed   By: Davina Poke D.O.   On: 03/14/2022 13:11            LOS: 2 days      Emeterio Reeve, DO [facility] 03/16/2022, 3:36 PM   Staff may message me via secure chat in [place]  but this may not receive immediate response,  please page for urgent matters!  If 7PM-7AM, please contact night-coverage [email or web address]  Dictation software was used to generate the above note. Typos may occur and escape review, as with typed/written notes. Please contact Dr Sheppard Coil directly for clarity if needed.

## 2022-03-16 NOTE — Assessment & Plan Note (Signed)
Continue Paxil.

## 2022-03-17 DIAGNOSIS — J181 Lobar pneumonia, unspecified organism: Secondary | ICD-10-CM | POA: Diagnosis not present

## 2022-03-17 MED ORDER — PREDNISONE 10 MG PO TABS: ORAL_TABLET | ORAL | 0 refills | Status: AC

## 2022-03-17 MED ORDER — AMOXICILLIN-POT CLAVULANATE 500-125 MG PO TABS: 1.0000 | ORAL_TABLET | Freq: Two times a day (BID) | ORAL | 0 refills | Status: AC

## 2022-03-17 NOTE — TOC Progression Note (Signed)
Transition of Care [facility]) - Progression Note    Patient Details  Name: Stephanie [name] MRN: [id number] Date of Birth: [birth year]  [facility] CM/SW Contact  Stephanie Sessions, RN Phone Number: 03/17/2022, 10:16 AM  Clinical Narrative:        Per Stephanie [name] with Stephanie [name] can accept patient for PT and OT.  Daughter Stephanie [name] updated.     Expected Discharge Plan and Services                                                 Social Determinants of Health (SDOH) Interventions    Readmission Risk Interventions     No data to display

## 2022-03-17 NOTE — Progress Notes (Signed)
Mobility Specialist - Progress Note    03/17/22 0925  Mobility  Activity Ambulated with assistance in room;Ambulated with assistance to bathroom;Stood at bedside;Dangled on edge of bed  Level of Assistance Contact guard assist, steadying assist  Assistive Device Front wheel walker  Distance Ambulated (ft) 60 ft  Activity Response Tolerated well  $Mobility charge 1 Mobility   Pt supine in bed on RA upon arrival. Pt scoots to EOB and STS CGA. Ambulates around room and into restroom SBA. Pt able to stand and balance to wash hands SBA. Pt returns to bed with needs in reach and bed alarm.   Gretchen Short  Mobility Specialist  03/17/22 9:29 AM

## 2022-03-17 NOTE — Progress Notes (Signed)
Discharge instructions reviewed with patient and daughter Jacqlyn Larsen) via telephone. PIV removed with no complications. Questions and concerns were addressed. Patient [place] home with daughter via car

## 2022-03-17 NOTE — Care Management Important Message (Signed)
Important Message  Patient Details  Name: Stephanie [name] MRN: [id number] Date of Birth: 19-Jun-1936   Medicare Important Message Given:  Yes     Dannette Barbara 03/17/2022, 12:35 PM

## 2022-03-17 NOTE — TOC Transition Note (Signed)
Transition of Care [facility]) - CM/SW Discharge Note   Patient Details  Name: Stephanie [name] MRN: [id number] Date of Birth: Aug 10, 1935  [facility]) CM/SW Contact:  Beverly Sessions, RN Phone Number: 03/17/2022, 10:39 AM   Clinical Narrative:     Patient to dc today.  Meg with [employer] notified Per Zack with adapt concentrator to be delivered to home today          Patient Goals and CMS Choice        Discharge Placement                       Discharge Plan and Services                                     Social Determinants of Health (SDOH) Interventions     Readmission Risk Interventions     No data to display

## 2022-03-17 NOTE — Discharge Summary (Signed)
Physician Discharge Summary   Patient: Stephanie [name] MRN: [id number]  DOB: 1936/07/29   Admit:     Date of Admission: 03/14/2022 Admitted from: home   Discharge: Date of discharge: 03/17/22 Disposition: Home health Condition at discharge: good  CODE STATUS: DNR     Discharge Physician: Emeterio Reeve, DO [facility]     PCP: Adin Hector, MD  Recommendations for Outpatient Follow-up:  Follow up with PCP Adin Hector, MD in 2-4 weeks FOllow as directed w/ pulmonary office  PCP AND OTHER OUTPATIENT PROVIDERS: SEE BELOW FOR SPECIFIC DISCHARGE INSTRUCTIONS PRINTED FOR PATIENT IN ADDITION TO GENERIC AVS PATIENT INFO     Discharge Instructions     Diet - low sodium heart healthy   Complete by: As directed    Discharge instructions   Complete by: As directed    Please keep appointment with Dr. Gust Brooms office in 1 week for follow-up on COPD and they are also planning repeat chest x-ray.  Pulmonology office should call you, if you do not hear from them by tomorrow, please call them to confirm.   Increase activity slowly   Complete by: As directed           Hospital Course: 86 y.o. female with medical history significant for CVA, COPD, depression, coronary artery disease, DVT admitted 03/14/2022 for acute hypoxic respiratory failure due to COPD exacerbation and right lower lobe pneumonia 8/7: Received azithromycin and cefepime started by ED, switched to started ampicillin-sulbactam 3 g every 12 hours, 8/8: Wean oxygen as able, pulmonary consult -Dr. Raul Del saw the patient: O2 keep sats around 92 to 94%, continue DuoNebs Dulera budesonide prednisone and current antibiotics with ampicillin-sulbactam 8/9: SpO2 100% 2 L [place], WBC trending up due to steroids. Awaiting home O2 delivery, should be done by tomorrow  8/10: confirmed meds on d/c per pulmonary note, confirmed home O2 in place.    Consultants:  Pulmonary   Procedures:   none      Discharge Diagnoses: Principal Problem:   Lobar pneumonia ([place]) Active Problems:   Acute respiratory failure with hypoxia ([place])   History of stroke   Acute exacerbation of chronic obstructive pulmonary disease (COPD) (HCC)   DVT (deep venous thrombosis) (HCC)   Depression   CKD (chronic kidney disease) stage 3, GFR 30-59 ml/min (HCC)    Assessment & Plan:  Lobar pneumonia ([place]) Imaging shows right lower lobe infiltrate, concern for possible aspiration pneumonia IV Unasyn  --> po Augmentin Appreciate pulmonary and speech therapy input  History of stroke Continue Eliquis.   Continue Norvasc Hold metoprolol due to hypotension.  Resume outpatient as blood pressure improves  DVT (deep venous thrombosis) (HCC) Continue apixaban  Depression Continue Paxil  CKD (chronic kidney disease) stage 3, GFR 30-59 ml/min (HCC) Stable Trend BMP  Acute respiratory failure with hypoxia (HCC) Secondary to acute COPD exacerbation as well as lobar pneumonia. Patient had room air pulse oximetry of 84% and is was tapered to 2 L of oxygen Continue oxygen supplementation to maintain pulse oximetry greater than 92% Pulmonary following Home O2   Acute exacerbation of chronic obstructive pulmonary disease (COPD) ([place]) Patient with a known history of COPD who presents for evaluation of worsening shortness of breath from baseline as well as wheezing Place patient on scheduled and as needed bronchodilator therapy Continue inhaled and systemic steroids Pneumonia tx as above       Discharge Instructions  Allergies as of 03/17/2022  Reactions   Duloxetine Hcl Other (See Comments)   Altered Mental Status   Influenza Vaccines Swelling   At injection site   Venlafaxine Nausea And Vomiting        Medication List     STOP taking these medications    cetirizine 10 MG tablet Commonly known as: ZYRTEC   triamcinolone 0.025 % cream Commonly known as: KENALOG        TAKE these medications    acetaminophen 500 MG tablet Commonly known as: TYLENOL Take 1,000 mg by mouth 2 (two) times daily.   albuterol 108 (90 Base) MCG/ACT inhaler Commonly known as: ProAir HFA INHALE TWO PUFFS INTO LUNGS EVERY 6 HOURS AS NEEDED   amLODipine 5 MG tablet Commonly known as: NORVASC TAKE 1 TABLET BY MOUTH EVERY DAY   amoxicillin-clavulanate 500-125 MG tablet Commonly known as: AUGMENTIN Take 1 tablet (500 mg total) by mouth 2 (two) times daily for 5 days.   apixaban 2.5 MG Tabs tablet Commonly known as: Eliquis Take 1 tablet (2.5 mg total) by mouth 2 (two) times daily.   Calcium Carb-Cholecalciferol 600-800 MG-UNIT Tabs Take 1 tablet by mouth daily.   docusate sodium 250 MG capsule Commonly known as: COLACE Take 100 mg by mouth at bedtime.   gabapentin 100 MG capsule Commonly known as: NEURONTIN TAKE 1 CAPSULE (100MG ) BY MOUTH TWICE DAILY What changed: See the new instructions.   Stevphen [place] 24/6 Misc Take 1 tablet by mouth daily.   loratadine 10 MG tablet Commonly known as: CLARITIN Take 10 mg by mouth daily.   montelukast 10 MG tablet Commonly known as: SINGULAIR Take 10 mg by mouth at bedtime.   multivitamin tablet Take 1 tablet by mouth daily.   omeprazole 20 MG capsule Commonly known as: PRILOSEC TAKE 1 CAPSULE BY MOUTH EVERY DAY What changed:  how much to take when to take this   PARoxetine 10 MG tablet Commonly known as: PAXIL TAKE 1 TABLET (10MG ) IN THE MORNING AND 2 TABLETS (20MG ) AT BEDTIME   predniSONE 10 MG tablet Commonly known as: DELTASONE Take 2 tablets (20 mg total) by mouth daily for 2 days, THEN 1 tablet (10 mg total) daily for 2 days, THEN 0.5 tablets (5 mg total) daily for 2 days. Start taking on: March 17, 2022 What changed: See the new instructions.   Spacer/Aero Chamber [employer] Use spacer on inhalers for better administration.   Spiriva HandiHaler 18 MCG inhalation capsule Generic drug:  tiotropium Place 1 capsule (18 mcg total) into inhaler and inhale daily. One puff daily   Symbicort 160-4.5 MCG/ACT inhaler Generic drug: budesonide-formoterol INHALE TWO PUFFS INTO THE LUNGS TWICE A DAY   Trelegy Ellipta 100-62.5-25 MCG/ACT Aepb Generic drug: Fluticasone-Umeclidin-Vilant Inhale 1 puff into the lungs at bedtime.               Durable Medical Equipment  (From admission, onward)           Start     Ordered   03/16/22 1521  For home use only DME Walker rolling  Once       Question Answer Comment  Walker: With 5 Inch Wheels   Patient needs a walker to treat with the following condition Weakness      03/16/22 1520              Allergies  Allergen Reactions   Duloxetine Hcl Other (See Comments)    Altered Mental Status   Influenza Vaccines Swelling    At injection  site   Venlafaxine Nausea And Vomiting     Subjective: Pt feeling well, no SOB at this time   Discharge Exam: BP 136/74 (BP Location: Right Arm)   Pulse 83   Temp 98.2 F (36.8 C) (Oral)   Resp 18   Ht 5\' 2"  (1.575 m)   Wt 53.1 kg   SpO2 90%   BMI 21.41 kg/m  General: Pt is alert, awake, not in acute distress Cardiovascular: RRR, S1/S2 +, no rubs, no gallops Respiratory: reduced lung sounds all fields, faint scattered wheezing, no rhonchi Abdominal: Soft, NT, ND, bowel sounds + Extremities: no edema, no cyanosis     The results of significant diagnostics from this hospitalization (including imaging, microbiology, ancillary and laboratory) are listed below for reference.     Microbiology: Recent Results (from the past 240 hour(s))  SARS Coronavirus 2 by RT PCR (hospital order, performed in [facility] hospital lab) *cepheid single result test* Anterior Nasal Swab     Status: None   Collection Time: 03/14/22 12:42 PM   Specimen: Anterior Nasal Swab  Result Value Ref Range Status   SARS Coronavirus 2 by RT PCR NEGATIVE NEGATIVE Final    Comment: (NOTE) SARS-CoV-2  target nucleic acids are NOT DETECTED.  The SARS-CoV-2 RNA is generally detectable in upper and lower respiratory specimens during the acute phase of infection. The lowest concentration of SARS-CoV-2 viral copies this assay can detect is 250 copies / mL. A negative result does not preclude SARS-CoV-2 infection and should not be used as the sole basis for treatment or other patient management decisions.  A negative result may occur with improper specimen collection / handling, submission of specimen other than nasopharyngeal swab, presence of viral mutation(s) within the areas targeted by this assay, and inadequate number of viral copies (<250 copies / mL). A negative result must be combined with clinical observations, patient history, and epidemiological information.  Fact Sheet for Patients:   https://www.patel.info/  Fact Sheet for Healthcare Providers: https://hall.com/  This test is not yet approved or  cleared by the [place] FDA and has been authorized for detection and/or diagnosis of SARS-CoV-2 by FDA under an Emergency Use Authorization (EUA).  This EUA will remain in effect (meaning this test can be used) for the duration of the COVID-19 declaration under Section 564(b)(1) of the Act, 21 U.S.C. section 360bbb-3(b)(1), unless the authorization is terminated or revoked sooner.  Performed at [facility], [address] [place], [place] [address]   Blood culture (routine x 2)     Status: None (Preliminary result)   Collection Time: 03/14/22  2:10 PM   Specimen: BLOOD  Result Value Ref Range Status   Specimen Description BLOOD BLOOD LEFT WRIST  Final   Special Requests   Final    BOTTLES DRAWN AEROBIC AND ANAEROBIC Blood Culture adequate volume   Culture   Final    NO GROWTH 3 DAYS Performed at [facility], [address] [place],  [address]    Report Status PENDING  Incomplete  Blood culture  (routine x 2)     Status: None (Preliminary result)   Collection Time: 03/14/22  2:10 PM   Specimen: BLOOD  Result Value Ref Range Status   Specimen Description BLOOD BLOOD RIGHT WRIST  Final   Special Requests   Final    BOTTLES DRAWN AEROBIC AND ANAEROBIC Blood Culture adequate volume   Culture   Final    NO GROWTH 3 DAYS Performed at [facility], [place], [place] [address]    Report Status PENDING  Incomplete     Labs: BNP (last 3 results) No results for input(s): "BNP" in the last 8760 hours. Basic Metabolic Panel: Recent Labs  Lab 03/14/22 1242 03/15/22 0426 03/16/22 0435  NA 135 137 142  K 3.9 4.5 4.3  CL 103 107 110  CO2 22 24 24   GLUCOSE 148* 169* 135*  BUN 29* 28* 30*  CREATININE 1.54* 1.34* 1.41*  CALCIUM 8.8* 8.9 9.4   Liver Function Tests: Recent Labs  Lab 03/14/22 1242  AST 15  ALT 14  ALKPHOS 80  BILITOT 0.5  PROT 6.7  ALBUMIN 3.2*   No results for input(s): "LIPASE", "AMYLASE" in the last 168 hours. No results for input(s): "AMMONIA" in the last 168 hours. CBC: Recent Labs  Lab 03/14/22 1242 03/15/22 0426 03/16/22 0435  WBC 16.1* 9.1 16.9*  NEUTROABS 13.4*  --   --   HGB 10.0* 9.5* 10.7*  HCT 31.1* 29.1* 33.3*  MCV 93.7 93.6 94.3  PLT 310 288 384   Cardiac Enzymes: No results for input(s): "CKTOTAL", "CKMB", "CKMBINDEX", "TROPONINI" in the last 168 hours. BNP: Invalid input(s): "POCBNP" CBG: No results for input(s): "GLUCAP" in the last 168 hours. D-Dimer No results for input(s): "DDIMER" in the last 72 hours. Hgb A1c No results for input(s): "HGBA1C" in the last 72 hours. Lipid Profile No results for input(s): "CHOL", "HDL", "LDLCALC", "TRIG", "CHOLHDL", "LDLDIRECT" in the last 72 hours. Thyroid function studies No results for input(s): "TSH", "T4TOTAL", "T3FREE", "THYROIDAB" in the last 72 hours.  Invalid input(s): "FREET3" Anemia work up No results for input(s): "VITAMINB12", "FOLATE", "FERRITIN",  "TIBC", "IRON", "RETICCTPCT" in the last 72 hours. Urinalysis    Component Value Date/Time   COLORURINE YELLOW (A) 09/12/2017 1659   APPEARANCEUR HAZY (A) 09/12/2017 1659   APPEARANCEUR Clear 12/15/2016 1654   LABSPEC 1.011 09/12/2017 1659   PHURINE 5.0 09/12/2017 1659   GLUCOSEU NEGATIVE 09/12/2017 1659   HGBUR SMALL (A) 09/12/2017 1659   BILIRUBINUR NEGATIVE 09/12/2017 1659   BILIRUBINUR Negative 12/15/2016 1654   KETONESUR NEGATIVE 09/12/2017 1659   PROTEINUR NEGATIVE 09/12/2017 1659   NITRITE NEGATIVE 09/12/2017 1659   LEUKOCYTESUR NEGATIVE 09/12/2017 1659   LEUKOCYTESUR Negative 12/15/2016 1654   Sepsis Labs Recent Labs  Lab 03/14/22 1242 03/15/22 0426 03/16/22 0435  WBC 16.1* 9.1 16.9*   Microbiology Recent Results (from the past 240 hour(s))  SARS Coronavirus 2 by RT PCR (hospital order, performed in [place] [facility] hospital lab) *cepheid single result test* Anterior Nasal Swab     Status: None   Collection Time: 03/14/22 12:42 PM   Specimen: Anterior Nasal Swab  Result Value Ref Range Status   SARS Coronavirus 2 by RT PCR NEGATIVE NEGATIVE Final    Comment: (NOTE) SARS-CoV-2 target nucleic acids are NOT DETECTED.  The SARS-CoV-2 RNA is generally detectable in upper and lower respiratory specimens during the acute phase of infection. The lowest concentration of SARS-CoV-2 viral copies this assay can detect is 250 copies / mL. A negative result does not preclude SARS-CoV-2 infection and should not be used as the sole basis for treatment or other patient management decisions.  A negative result may occur with improper specimen collection / handling, submission of specimen other than nasopharyngeal swab, presence of viral mutation(s) within the areas targeted by this assay, and inadequate number of viral copies (<250 copies / mL). A negative result must be combined with clinical observations, patient history, and  epidemiological information.  Fact Sheet for Patients:    https://www.patel.info/  Fact Sheet for Healthcare Providers: https://hall.com/  This test is not yet approved or  cleared by the [place] FDA and has been authorized for detection and/or diagnosis of SARS-CoV-2 by FDA under an Emergency Use Authorization (EUA).  This EUA will remain in effect (meaning this test can be used) for the duration of the COVID-19 declaration under Section 564(b)(1) of the Act, 21 U.S.C. section 360bbb-3(b)(1), unless the authorization is terminated or revoked sooner.  Performed at [facility], [address] [place], [place] [address]   Blood culture (routine x 2)     Status: None (Preliminary result)   Collection Time: 03/14/22  2:10 PM   Specimen: BLOOD  Result Value Ref Range Status   Specimen Description BLOOD BLOOD LEFT WRIST  Final   Special Requests   Final    BOTTLES DRAWN AEROBIC AND ANAEROBIC Blood Culture adequate volume   Culture   Final    NO GROWTH 3 DAYS Performed at [facility], [address] [place], [place] [address]    Report Status PENDING  Incomplete  Blood culture (routine x 2)     Status: None (Preliminary result)   Collection Time: 03/14/22  2:10 PM   Specimen: BLOOD  Result Value Ref Range Status   Specimen Description BLOOD BLOOD RIGHT WRIST  Final   Special Requests   Final    BOTTLES DRAWN AEROBIC AND ANAEROBIC Blood Culture adequate volume   Culture   Final    NO GROWTH 3 DAYS Performed at [facility], [address], [place], [place] [address]    Report Status PENDING  Incomplete   Imaging DG Chest Portable 1 View  Result Date: 03/14/2022 CLINICAL DATA:  Shortness of breath EXAM: PORTABLE CHEST 1 VIEW COMPARISON:  11/26/2020 FINDINGS: Left-sided implanted loop recorder device. The heart size and mediastinal contours are within normal limits. Aortic atherosclerosis. Patchy airspace opacity throughout the right lower lobe. Left lung is  clear. No pleural effusion or pneumothorax. IMPRESSION: Patchy airspace opacity throughout the right lower lobe suspicious for pneumonia. Radiographic follow-up to resolution is recommended. Electronically Signed   By: Davina Poke D.O.   On: 03/14/2022 13:11      Time coordinating discharge: Over 30 minutes  SIGNED:  Emeterio Reeve DO [facility]

## 2022-03-18 ENCOUNTER — Other Ambulatory Visit: Payer: Self-pay | Admitting: *Deleted

## 2022-03-18 DIAGNOSIS — J449 Chronic obstructive pulmonary disease, unspecified: Secondary | ICD-10-CM | POA: Diagnosis not present

## 2022-03-18 DIAGNOSIS — Z9981 Dependence on supplemental oxygen: Secondary | ICD-10-CM | POA: Diagnosis not present

## 2022-03-18 DIAGNOSIS — N183 Chronic kidney disease, stage 3 unspecified: Secondary | ICD-10-CM | POA: Diagnosis not present

## 2022-03-18 DIAGNOSIS — R1310 Dysphagia, unspecified: Secondary | ICD-10-CM | POA: Diagnosis not present

## 2022-03-18 DIAGNOSIS — I2510 Atherosclerotic heart disease of native coronary artery without angina pectoris: Secondary | ICD-10-CM | POA: Diagnosis not present

## 2022-03-18 DIAGNOSIS — M1380 Other specified arthritis, unspecified site: Secondary | ICD-10-CM | POA: Diagnosis not present

## 2022-03-18 DIAGNOSIS — Z792 Long term (current) use of antibiotics: Secondary | ICD-10-CM | POA: Diagnosis not present

## 2022-03-18 DIAGNOSIS — I69320 Aphasia following cerebral infarction: Secondary | ICD-10-CM | POA: Diagnosis not present

## 2022-03-18 DIAGNOSIS — J181 Lobar pneumonia, unspecified organism: Secondary | ICD-10-CM | POA: Diagnosis not present

## 2022-03-18 DIAGNOSIS — I129 Hypertensive chronic kidney disease with stage 1 through stage 4 chronic kidney disease, or unspecified chronic kidney disease: Secondary | ICD-10-CM | POA: Diagnosis not present

## 2022-03-18 NOTE — Patient Outreach (Signed)
  Care Coordination [facility] Note Transition Care Management Follow-up Telephone Call Date of discharge and from where: 03/17/22 [facility] How have you been since you were released from the hospital? Daughter states that the patient is doing well. Any questions or concerns? No  Items Reviewed: Did the pt receive and understand the discharge instructions provided? Yes  Medications obtained and verified? Yes  Other? No  Any new allergies since your discharge? No  Dietary orders reviewed? Yes Do you have support at home? Yes   Home Care and Equipment/Supplies: Were home health services ordered? yes If so, what is the name of the agency? [facility]  Has the agency set up a time to come to the patient's home? yes Were any new equipment or medical supplies ordered?  Yes: oxygen concentrator and walker What is the name of the medical supply agency? [employer] Were you able to get the supplies/equipment? yes Do you have any questions related to the use of the equipment or supplies? No  Functional Questionnaire: (I = Independent and D = Dependent) ADLs: D  Bathing/Dressing- D  Meal Prep- D  Eating- I  Maintaining continence- I  Transferring/Ambulation- D  Managing Meds- D  Follow up appointments reviewed:  PCP Hospital f/u appt confirmed? No   Specialist Hospital f/u appt confirmed? Yes  Scheduled to see Dr. Raul Del, daughter has a call in for someone to call her back with an appointment  Are transportation arrangements needed? No  If their condition worsens, is the pt aware to call PCP or go to the Emergency Dept.? Yes Was the patient provided with contact information for the PCP's office or ED? Yes Was to pt encouraged to call back with questions or concerns? Yes  SDOH assessments and interventions completed:   Yes  Care Coordination Interventions Activated:  No   Care Coordination Interventions:   no, not indicated     Encounter Outcome:  Pt. Visit Completed    Emelia Loron RN, BSN [facility] [phone] [name]

## 2022-03-19 LAB — CULTURE, BLOOD (ROUTINE X 2)
Culture: NO GROWTH
Culture: NO GROWTH
Special Requests: ADEQUATE
Special Requests: ADEQUATE

## 2022-03-23 DIAGNOSIS — J449 Chronic obstructive pulmonary disease, unspecified: Secondary | ICD-10-CM | POA: Diagnosis not present

## 2022-03-23 DIAGNOSIS — Z9289 Personal history of other medical treatment: Secondary | ICD-10-CM | POA: Diagnosis not present

## 2022-03-23 DIAGNOSIS — R0902 Hypoxemia: Secondary | ICD-10-CM | POA: Diagnosis not present

## 2022-03-23 DIAGNOSIS — R918 Other nonspecific abnormal finding of lung field: Secondary | ICD-10-CM | POA: Diagnosis not present

## 2022-03-23 DIAGNOSIS — Z9981 Dependence on supplemental oxygen: Secondary | ICD-10-CM | POA: Diagnosis not present

## 2022-03-31 DIAGNOSIS — N1832 Chronic kidney disease, stage 3b: Secondary | ICD-10-CM | POA: Diagnosis not present

## 2022-03-31 DIAGNOSIS — I2510 Atherosclerotic heart disease of native coronary artery without angina pectoris: Secondary | ICD-10-CM | POA: Diagnosis not present

## 2022-03-31 DIAGNOSIS — I10 Essential (primary) hypertension: Secondary | ICD-10-CM | POA: Diagnosis not present

## 2022-03-31 DIAGNOSIS — E785 Hyperlipidemia, unspecified: Secondary | ICD-10-CM | POA: Diagnosis not present

## 2022-04-05 DIAGNOSIS — E785 Hyperlipidemia, unspecified: Secondary | ICD-10-CM | POA: Diagnosis not present

## 2022-04-05 DIAGNOSIS — J439 Emphysema, unspecified: Secondary | ICD-10-CM | POA: Diagnosis not present

## 2022-04-05 DIAGNOSIS — D649 Anemia, unspecified: Secondary | ICD-10-CM | POA: Diagnosis not present

## 2022-04-05 DIAGNOSIS — I10 Essential (primary) hypertension: Secondary | ICD-10-CM | POA: Diagnosis not present

## 2022-04-05 DIAGNOSIS — N1832 Chronic kidney disease, stage 3b: Secondary | ICD-10-CM | POA: Diagnosis not present

## 2022-04-05 DIAGNOSIS — Z8673 Personal history of transient ischemic attack (TIA), and cerebral infarction without residual deficits: Secondary | ICD-10-CM | POA: Diagnosis not present

## 2022-04-05 DIAGNOSIS — D692 Other nonthrombocytopenic purpura: Secondary | ICD-10-CM | POA: Diagnosis not present

## 2022-04-05 DIAGNOSIS — D6869 Other thrombophilia: Secondary | ICD-10-CM | POA: Diagnosis not present

## 2022-04-05 DIAGNOSIS — I825Z2 Chronic embolism and thrombosis of unspecified deep veins of left distal lower extremity: Secondary | ICD-10-CM | POA: Diagnosis not present

## 2022-04-05 DIAGNOSIS — F01A0 Vascular dementia, mild, without behavioral disturbance, psychotic disturbance, mood disturbance, and anxiety: Secondary | ICD-10-CM | POA: Diagnosis not present

## 2022-04-05 DIAGNOSIS — T17908S Unspecified foreign body in respiratory tract, part unspecified causing other injury, sequela: Secondary | ICD-10-CM | POA: Diagnosis not present

## 2022-04-08 DIAGNOSIS — J449 Chronic obstructive pulmonary disease, unspecified: Secondary | ICD-10-CM | POA: Diagnosis not present

## 2022-04-08 DIAGNOSIS — J181 Lobar pneumonia, unspecified organism: Secondary | ICD-10-CM | POA: Diagnosis not present

## 2022-04-08 DIAGNOSIS — Z9981 Dependence on supplemental oxygen: Secondary | ICD-10-CM | POA: Diagnosis not present

## 2022-04-08 DIAGNOSIS — M1380 Other specified arthritis, unspecified site: Secondary | ICD-10-CM | POA: Diagnosis not present

## 2022-04-08 DIAGNOSIS — R1310 Dysphagia, unspecified: Secondary | ICD-10-CM | POA: Diagnosis not present

## 2022-04-08 DIAGNOSIS — N183 Chronic kidney disease, stage 3 unspecified: Secondary | ICD-10-CM | POA: Diagnosis not present

## 2022-04-08 DIAGNOSIS — I129 Hypertensive chronic kidney disease with stage 1 through stage 4 chronic kidney disease, or unspecified chronic kidney disease: Secondary | ICD-10-CM | POA: Diagnosis not present

## 2022-04-08 DIAGNOSIS — Z792 Long term (current) use of antibiotics: Secondary | ICD-10-CM | POA: Diagnosis not present

## 2022-04-08 DIAGNOSIS — I69320 Aphasia following cerebral infarction: Secondary | ICD-10-CM | POA: Diagnosis not present

## 2022-04-08 DIAGNOSIS — I2510 Atherosclerotic heart disease of native coronary artery without angina pectoris: Secondary | ICD-10-CM | POA: Diagnosis not present

## 2022-04-12 ENCOUNTER — Other Ambulatory Visit: Payer: Self-pay | Admitting: Internal Medicine

## 2022-04-12 DIAGNOSIS — T17908S Unspecified foreign body in respiratory tract, part unspecified causing other injury, sequela: Secondary | ICD-10-CM

## 2022-04-18 DIAGNOSIS — J449 Chronic obstructive pulmonary disease, unspecified: Secondary | ICD-10-CM | POA: Diagnosis not present

## 2022-04-18 DIAGNOSIS — I69320 Aphasia following cerebral infarction: Secondary | ICD-10-CM | POA: Diagnosis not present

## 2022-04-18 DIAGNOSIS — Z515 Encounter for palliative care: Secondary | ICD-10-CM | POA: Diagnosis not present

## 2022-04-18 DIAGNOSIS — E441 Mild protein-calorie malnutrition: Secondary | ICD-10-CM | POA: Diagnosis not present

## 2022-04-21 ENCOUNTER — Ambulatory Visit
Admission: RE | Admit: 2022-04-21 | Discharge: 2022-04-21 | Disposition: A | Discharge status: Home / Self Care | Payer: PPO | Source: Ambulatory Visit | Attending: Internal Medicine | Admitting: Internal Medicine

## 2022-04-21 DIAGNOSIS — D649 Anemia, unspecified: Secondary | ICD-10-CM | POA: Diagnosis not present

## 2022-04-21 DIAGNOSIS — N1832 Chronic kidney disease, stage 3b: Secondary | ICD-10-CM | POA: Diagnosis not present

## 2022-04-21 DIAGNOSIS — R1312 Dysphagia, oropharyngeal phase: Secondary | ICD-10-CM | POA: Diagnosis not present

## 2022-04-21 DIAGNOSIS — R1314 Dysphagia, pharyngoesophageal phase: Secondary | ICD-10-CM | POA: Diagnosis not present

## 2022-04-21 DIAGNOSIS — T17908S Unspecified foreign body in respiratory tract, part unspecified causing other injury, sequela: Secondary | ICD-10-CM | POA: Diagnosis not present

## 2022-04-21 NOTE — Therapy (Signed)
[place], [place], [address] Phone: [phone]   Fax:     Modified Barium Swallow  Patient Details  Name: Stephanie [name] MRN: [id number] Date of Birth: April 29, 1936 No data recorded  Encounter Date: 04/21/2022   End of Session - 04/21/22 1714     Visit Number 1    Number of Visits 1    Date for SLP Re-Evaluation 04/21/22    SLP Start Time 26    SLP Stop Time  1400    SLP Time Calculation (min) 60 min    Activity Tolerance Patient tolerated treatment well             Past Medical History:  Diagnosis Date   Arthritis    Baker's cyst of knee, right    a. 08/2016 U/S: Baker's cyst noted in R popliteal fossa.   Complication of anesthesia    COPD (chronic obstructive pulmonary disease) ([place])    a. Home O2 started 08/2017.   Coronary artery disease    a. prior h/o stenting ~ 2005.   Depression    Edema    History of cardiac monitoring    a. 08/2016 s/p LINQ placement following CVA. No AFib to date (09/2017).   History of pneumonia    Hx of ischemic left MCA stroke    a. 08/2016 large posterior L MCA territory infarct in the setting of LLE DVT and suggestion of PFO on transcranial dopplers w/ bubble study (no PFO reported on echocardiogram)-->Eliquis.   Hypertension    Left leg DVT ([place])    a. 08/2016 U/S: subacute DVT in prox L popliteal vein w/ chronic superficial thrombosis in the L greater saphenous vein from the mid to prox L calf-->Eliquis.   PFO (patent foramen ovale)    a. 08/2016 Abnl TCD w/ bubble study suggesting large PFO. Not reported on echocardiogram.   PONV (postoperative nausea and vomiting)     Past Surgical History:  Procedure Laterality Date   BALLOON ANGIOPLASTY, ARTERY  05/17/2009   CATARACT EXTRACTION W/PHACO Left 05/24/2016   Procedure: CATARACT EXTRACTION PHACO AND INTRAOCULAR LENS PLACEMENT (IOC);  Surgeon: Birder Robson, MD;  Location: [facility] ORS;  Service:  Ophthalmology;  Laterality: Left;  Lot# [id number] H [place]:   00:52.3 AP%:   27.3 CDE:  14.26    CATARACT EXTRACTION W/PHACO Right 06/14/2016   Procedure: CATARACT EXTRACTION PHACO AND INTRAOCULAR LENS PLACEMENT ([place]);  Surgeon: Birder Robson, MD;  Location: [facility] ORS;  Service: Ophthalmology;  Laterality: Right;  Lot# [id number] H [place]: 01:13.4 AP%: 24.4 CDE: 17.85   CORONARY ANGIOPLASTY     STENT   EP IMPLANTABLE DEVICE N/A 08/19/2016   Procedure: Loop Recorder Insertion;  Surgeon: Will Meredith Leeds, MD;  Location: [facility];  Service: Cardiovascular;  Laterality: N/A;    There were no vitals filed for this visit.   Subjective: Patient behavior: (alertness, ability to follow instructions, etc.): Chief complaint   Objective:  Radiological Procedure: A videoflouroscopic evaluation of oral-preparatory, reflex initiation, and pharyngeal phases of the swallow was performed; as well as a screening of the upper esophageal phase.  POSTURE: VIEW: COMPENSATORY STRATEGIES: BOLUSES ADMINISTERED:  Thin Liquid:  Nectar-thick Liquid:  Honey-thick Liquid:  Puree:  Mechanical Soft: RESULTS OF EVALUATION: ORAL PREPARATORY PHASE: (The lips, tongue, and velum are observed for strength and coordination)       **Overall Severity Rating:  SWALLOW INITIATION/REFLEX: (The reflex is normal if "triggered" by the time the bolus reached  the base of the tongue)  **Overall Severity Rating:  PHARYNGEAL PHASE: (Pharyngeal function is normal if the bolus shows rapid, smooth, and continuous transit through the pharynx and there is no pharyngeal residue after the swallow)  **Overall Severity Rating:  LARYNGEAL PENETRATION: (Material entering into the laryngeal inlet/vestibule but not aspirated)  ASPIRATION: ESOPHAGEAL PHASE: (Screening of the upper esophagus)  ASSESSMENT:  PLAN/RECOMMENDATIONS:  A. Diet: a more Mech Soft diet(foods cut small, moistened well); thin liquids. Pills Whole in Puree if  easier for swallowing.  B. Swallowing Precautions: general aspiration precautions; a F/U, DRY swallow b/t bites and sips to fully clear any BOT/pharyngeal residue.   C. Recommended consultation to: none recommended at this time.  D. Therapy recommendations: None currently.  E. Results and recommendations were discussed w/ Both pt and Daughter; education provided on the results and recommendations of the MBSS; swallowing in setting of COPD; strategy of f/u, DRY swallow b/t bites/sips; food consistencies/prep; Pill swallowing using a Puree if needed.    Pharyngoesophageal dysphagia Plan: DG SWALLOW FUNC OP MEDICARE SPEECH PATH, DG SWALLOW FUNC OP MEDICARE SPEECH PATH        Problem List Patient Active Problem List   Diagnosis Date Noted   Lobar pneumonia ([place]) 03/14/2022   Acute respiratory failure with hypoxia ([place]) 03/14/2022   CKD (chronic kidney disease) stage 3, GFR 30-59 ml/min (HCC) 03/14/2022   CAP (community acquired pneumonia) 09/15/2017   Sepsis ([place]) 09/12/2017   History of stroke 09/06/2017   Essential hypertension 04/14/2017   PFO (patent foramen ovale)    DVT (deep venous thrombosis) ([facility])    Cerebrovascular accident (CVA) due to embolism of left middle cerebral artery (HCC)    Expressive aphasia    Elevated troponin 08/18/2016   Mild malnutrition ([place]) 08/18/2016   Acute CVA (cerebrovascular accident) ([place]) 08/17/2016   Acute exacerbation of chronic obstructive pulmonary disease (COPD) ([place]) 07/19/2016   Depression 05/03/2016   Mild dementia ([place]) 04/19/2016   Anxiety and depression 03/04/2015   Arteriosclerosis of coronary artery 03/04/2015   CAFL (chronic airflow limitation) ([place]) 03/04/2015   Breathlessness on exertion 03/04/2015   HLD (hyperlipidemia) 03/04/2015   Osteopenia 03/04/2015   Peptic ulcer 03/04/2015   Amnesia 03/20/2014   Disordered sleep 03/20/2014   Allergy to environmental factors 11/14/2013   Gastroduodenal ulcer 11/14/2013   GI bleed  11/14/2013        Orinda Kenner, MS, CCC-SLP Speech Language Pathologist Re[facility] Services; [place] [phone] ([address] [place], [place] 04/21/2022, 5:16 PM  [facility] [place], [place], [address] Phone: [phone]   Fax:     Name: Madi Bonfiglio MRN: [id number] Date of Birth: 09-24-1935

## 2022-04-28 DIAGNOSIS — I129 Hypertensive chronic kidney disease with stage 1 through stage 4 chronic kidney disease, or unspecified chronic kidney disease: Secondary | ICD-10-CM | POA: Diagnosis not present

## 2022-04-28 DIAGNOSIS — Z792 Long term (current) use of antibiotics: Secondary | ICD-10-CM | POA: Diagnosis not present

## 2022-04-28 DIAGNOSIS — R1310 Dysphagia, unspecified: Secondary | ICD-10-CM | POA: Diagnosis not present

## 2022-04-28 DIAGNOSIS — J181 Lobar pneumonia, unspecified organism: Secondary | ICD-10-CM | POA: Diagnosis not present

## 2022-04-28 DIAGNOSIS — Z9981 Dependence on supplemental oxygen: Secondary | ICD-10-CM | POA: Diagnosis not present

## 2022-04-28 DIAGNOSIS — I69320 Aphasia following cerebral infarction: Secondary | ICD-10-CM | POA: Diagnosis not present

## 2022-04-28 DIAGNOSIS — J449 Chronic obstructive pulmonary disease, unspecified: Secondary | ICD-10-CM | POA: Diagnosis not present

## 2022-05-09 DIAGNOSIS — I129 Hypertensive chronic kidney disease with stage 1 through stage 4 chronic kidney disease, or unspecified chronic kidney disease: Secondary | ICD-10-CM | POA: Diagnosis not present

## 2022-05-09 DIAGNOSIS — J449 Chronic obstructive pulmonary disease, unspecified: Secondary | ICD-10-CM | POA: Diagnosis not present

## 2022-05-09 DIAGNOSIS — R1310 Dysphagia, unspecified: Secondary | ICD-10-CM | POA: Diagnosis not present

## 2022-05-09 DIAGNOSIS — Z9981 Dependence on supplemental oxygen: Secondary | ICD-10-CM | POA: Diagnosis not present

## 2022-05-09 DIAGNOSIS — I2510 Atherosclerotic heart disease of native coronary artery without angina pectoris: Secondary | ICD-10-CM | POA: Diagnosis not present

## 2022-05-09 DIAGNOSIS — J181 Lobar pneumonia, unspecified organism: Secondary | ICD-10-CM | POA: Diagnosis not present

## 2022-05-09 DIAGNOSIS — M1380 Other specified arthritis, unspecified site: Secondary | ICD-10-CM | POA: Diagnosis not present

## 2022-05-09 DIAGNOSIS — N183 Chronic kidney disease, stage 3 unspecified: Secondary | ICD-10-CM | POA: Diagnosis not present

## 2022-05-09 DIAGNOSIS — I69320 Aphasia following cerebral infarction: Secondary | ICD-10-CM | POA: Diagnosis not present

## 2022-05-09 DIAGNOSIS — Z792 Long term (current) use of antibiotics: Secondary | ICD-10-CM | POA: Diagnosis not present

## 2022-05-17 DIAGNOSIS — E441 Mild protein-calorie malnutrition: Secondary | ICD-10-CM | POA: Diagnosis not present

## 2022-05-17 DIAGNOSIS — R058 Other specified cough: Secondary | ICD-10-CM | POA: Diagnosis not present

## 2022-05-17 DIAGNOSIS — R509 Fever, unspecified: Secondary | ICD-10-CM | POA: Diagnosis not present

## 2022-05-17 DIAGNOSIS — R059 Cough, unspecified: Secondary | ICD-10-CM | POA: Diagnosis not present

## 2022-05-17 DIAGNOSIS — F01A0 Vascular dementia, mild, without behavioral disturbance, psychotic disturbance, mood disturbance, and anxiety: Secondary | ICD-10-CM | POA: Diagnosis not present

## 2022-05-17 DIAGNOSIS — J189 Pneumonia, unspecified organism: Secondary | ICD-10-CM | POA: Diagnosis not present

## 2022-05-17 DIAGNOSIS — J439 Emphysema, unspecified: Secondary | ICD-10-CM | POA: Diagnosis not present

## 2022-05-17 DIAGNOSIS — I959 Hypotension, unspecified: Secondary | ICD-10-CM | POA: Diagnosis not present

## 2022-05-18 ENCOUNTER — Inpatient Hospital Stay
Admission: EM | Admit: 2022-05-18 | Discharge: 2022-05-26 | DRG: 193 | Disposition: A | Discharge status: Skilled Nursing Facility | Payer: PPO | Attending: Internal Medicine | Admitting: Internal Medicine

## 2022-05-18 ENCOUNTER — Other Ambulatory Visit: Payer: Self-pay

## 2022-05-18 ENCOUNTER — Emergency Department: Payer: PPO

## 2022-05-18 DIAGNOSIS — I69320 Aphasia following cerebral infarction: Secondary | ICD-10-CM | POA: Diagnosis not present

## 2022-05-18 DIAGNOSIS — J9621 Acute and chronic respiratory failure with hypoxia: Secondary | ICD-10-CM

## 2022-05-18 DIAGNOSIS — I10 Essential (primary) hypertension: Secondary | ICD-10-CM | POA: Diagnosis present

## 2022-05-18 DIAGNOSIS — Z7901 Long term (current) use of anticoagulants: Secondary | ICD-10-CM

## 2022-05-18 DIAGNOSIS — Z888 Allergy status to other drugs, medicaments and biological substances status: Secondary | ICD-10-CM | POA: Diagnosis not present

## 2022-05-18 DIAGNOSIS — J441 Chronic obstructive pulmonary disease with (acute) exacerbation: Secondary | ICD-10-CM | POA: Diagnosis present

## 2022-05-18 DIAGNOSIS — F32A Depression, unspecified: Secondary | ICD-10-CM | POA: Diagnosis not present

## 2022-05-18 DIAGNOSIS — F01A0 Vascular dementia, mild, without behavioral disturbance, psychotic disturbance, mood disturbance, and anxiety: Secondary | ICD-10-CM | POA: Diagnosis not present

## 2022-05-18 DIAGNOSIS — R509 Fever, unspecified: Secondary | ICD-10-CM | POA: Diagnosis not present

## 2022-05-18 DIAGNOSIS — K5900 Constipation, unspecified: Secondary | ICD-10-CM | POA: Diagnosis present

## 2022-05-18 DIAGNOSIS — J309 Allergic rhinitis, unspecified: Secondary | ICD-10-CM | POA: Diagnosis not present

## 2022-05-18 DIAGNOSIS — E440 Moderate protein-calorie malnutrition: Secondary | ICD-10-CM | POA: Diagnosis not present

## 2022-05-18 DIAGNOSIS — K219 Gastro-esophageal reflux disease without esophagitis: Secondary | ICD-10-CM | POA: Diagnosis not present

## 2022-05-18 DIAGNOSIS — D509 Iron deficiency anemia, unspecified: Secondary | ICD-10-CM | POA: Diagnosis not present

## 2022-05-18 DIAGNOSIS — D631 Anemia in chronic kidney disease: Secondary | ICD-10-CM | POA: Diagnosis not present

## 2022-05-18 DIAGNOSIS — Z87891 Personal history of nicotine dependence: Secondary | ICD-10-CM

## 2022-05-18 DIAGNOSIS — K279 Peptic ulcer, site unspecified, unspecified as acute or chronic, without hemorrhage or perforation: Secondary | ICD-10-CM | POA: Diagnosis not present

## 2022-05-18 DIAGNOSIS — Z8673 Personal history of transient ischemic attack (TIA), and cerebral infarction without residual deficits: Secondary | ICD-10-CM | POA: Diagnosis not present

## 2022-05-18 DIAGNOSIS — I959 Hypotension, unspecified: Secondary | ICD-10-CM | POA: Diagnosis not present

## 2022-05-18 DIAGNOSIS — Z9981 Dependence on supplemental oxygen: Secondary | ICD-10-CM | POA: Diagnosis not present

## 2022-05-18 DIAGNOSIS — N130 Hydronephrosis with ureteropelvic junction obstruction: Secondary | ICD-10-CM | POA: Diagnosis not present

## 2022-05-18 DIAGNOSIS — J181 Lobar pneumonia, unspecified organism: Secondary | ICD-10-CM | POA: Diagnosis not present

## 2022-05-18 DIAGNOSIS — J440 Chronic obstructive pulmonary disease with acute lower respiratory infection: Secondary | ICD-10-CM | POA: Diagnosis present

## 2022-05-18 DIAGNOSIS — M1380 Other specified arthritis, unspecified site: Secondary | ICD-10-CM | POA: Diagnosis not present

## 2022-05-18 DIAGNOSIS — R531 Weakness: Secondary | ICD-10-CM | POA: Diagnosis not present

## 2022-05-18 DIAGNOSIS — G629 Polyneuropathy, unspecified: Secondary | ICD-10-CM | POA: Diagnosis not present

## 2022-05-18 DIAGNOSIS — Z887 Allergy status to serum and vaccine status: Secondary | ICD-10-CM

## 2022-05-18 DIAGNOSIS — R4701 Aphasia: Secondary | ICD-10-CM | POA: Diagnosis not present

## 2022-05-18 DIAGNOSIS — Z6821 Body mass index (BMI) 21.0-21.9, adult: Secondary | ICD-10-CM | POA: Diagnosis not present

## 2022-05-18 DIAGNOSIS — J189 Pneumonia, unspecified organism: Secondary | ICD-10-CM | POA: Diagnosis present

## 2022-05-18 DIAGNOSIS — N1832 Chronic kidney disease, stage 3b: Secondary | ICD-10-CM | POA: Diagnosis present

## 2022-05-18 DIAGNOSIS — I82409 Acute embolism and thrombosis of unspecified deep veins of unspecified lower extremity: Secondary | ICD-10-CM | POA: Diagnosis present

## 2022-05-18 DIAGNOSIS — R1310 Dysphagia, unspecified: Secondary | ICD-10-CM | POA: Diagnosis not present

## 2022-05-18 DIAGNOSIS — E441 Mild protein-calorie malnutrition: Secondary | ICD-10-CM | POA: Diagnosis not present

## 2022-05-18 DIAGNOSIS — J9691 Respiratory failure, unspecified with hypoxia: Secondary | ICD-10-CM | POA: Diagnosis present

## 2022-05-18 DIAGNOSIS — Z66 Do not resuscitate: Secondary | ICD-10-CM | POA: Diagnosis not present

## 2022-05-18 DIAGNOSIS — M1990 Unspecified osteoarthritis, unspecified site: Secondary | ICD-10-CM | POA: Diagnosis not present

## 2022-05-18 DIAGNOSIS — E872 Acidosis, unspecified: Secondary | ICD-10-CM | POA: Diagnosis present

## 2022-05-18 DIAGNOSIS — J9811 Atelectasis: Secondary | ICD-10-CM | POA: Diagnosis not present

## 2022-05-18 DIAGNOSIS — Z86718 Personal history of other venous thrombosis and embolism: Secondary | ICD-10-CM | POA: Diagnosis not present

## 2022-05-18 DIAGNOSIS — J449 Chronic obstructive pulmonary disease, unspecified: Secondary | ICD-10-CM | POA: Diagnosis not present

## 2022-05-18 DIAGNOSIS — Z1152 Encounter for screening for COVID-19: Secondary | ICD-10-CM

## 2022-05-18 DIAGNOSIS — R0602 Shortness of breath: Secondary | ICD-10-CM | POA: Diagnosis not present

## 2022-05-18 DIAGNOSIS — R059 Cough, unspecified: Secondary | ICD-10-CM | POA: Diagnosis not present

## 2022-05-18 DIAGNOSIS — F329 Major depressive disorder, single episode, unspecified: Secondary | ICD-10-CM | POA: Diagnosis not present

## 2022-05-18 DIAGNOSIS — N179 Acute kidney failure, unspecified: Secondary | ICD-10-CM | POA: Diagnosis not present

## 2022-05-18 DIAGNOSIS — I129 Hypertensive chronic kidney disease with stage 1 through stage 4 chronic kidney disease, or unspecified chronic kidney disease: Secondary | ICD-10-CM | POA: Diagnosis not present

## 2022-05-18 DIAGNOSIS — R058 Other specified cough: Secondary | ICD-10-CM | POA: Diagnosis not present

## 2022-05-18 DIAGNOSIS — I2510 Atherosclerotic heart disease of native coronary artery without angina pectoris: Secondary | ICD-10-CM | POA: Diagnosis not present

## 2022-05-18 DIAGNOSIS — D649 Anemia, unspecified: Secondary | ICD-10-CM | POA: Diagnosis present

## 2022-05-18 DIAGNOSIS — Z955 Presence of coronary angioplasty implant and graft: Secondary | ICD-10-CM | POA: Diagnosis not present

## 2022-05-18 DIAGNOSIS — I82432 Acute embolism and thrombosis of left popliteal vein: Secondary | ICD-10-CM | POA: Diagnosis not present

## 2022-05-18 DIAGNOSIS — J439 Emphysema, unspecified: Secondary | ICD-10-CM | POA: Diagnosis not present

## 2022-05-18 HISTORY — DX: Aphasia: R47.01

## 2022-05-18 LAB — BASIC METABOLIC PANEL
Anion gap: 11 (ref 5–15)
BUN: 53 mg/dL — ABNORMAL HIGH (ref 8–23)
CO2: 23 mmol/L (ref 22–32)
Calcium: 9.5 mg/dL (ref 8.9–10.3)
Chloride: 101 mmol/L (ref 98–111)
Creatinine, Ser: 2.23 mg/dL — ABNORMAL HIGH (ref 0.44–1.00)
GFR, Estimated: 21 mL/min — ABNORMAL LOW (ref >60)
Glucose, Bld: 105 mg/dL — ABNORMAL HIGH (ref 70–99)
Potassium: 4.2 mmol/L (ref 3.5–5.1)
Sodium: 135 mmol/L (ref 135–145)

## 2022-05-18 LAB — CBC
HCT: 31.6 % — ABNORMAL LOW (ref 36.0–46.0)
Hemoglobin: 10.1 g/dL — ABNORMAL LOW (ref 12.0–15.0)
MCH: 30.8 pg (ref 26.0–34.0)
MCHC: 32 g/dL (ref 30.0–36.0)
MCV: 96.3 fL (ref 80.0–100.0)
Platelets: 249 10*3/uL (ref 150–400)
RBC: 3.28 MIL/uL — ABNORMAL LOW (ref 3.87–5.11)
RDW: 14.6 % (ref 11.5–15.5)
WBC: 10.1 10*3/uL (ref 4.0–10.5)
nRBC: 0 % (ref 0.0–0.2)

## 2022-05-18 LAB — BLOOD GAS, VENOUS
Acid-base deficit: 4.6 mmol/L — ABNORMAL HIGH (ref 0.0–2.0)
Bicarbonate: 23.2 mmol/L (ref 20.0–28.0)
O2 Saturation: 28.4 %
Patient temperature: 37
pCO2, Ven: 53 mmHg (ref 44–60)
pH, Ven: 7.25 (ref 7.25–7.43)
pO2, Ven: <31 mmHg — CL (ref 32–45)

## 2022-05-18 LAB — TROPONIN I (HIGH SENSITIVITY)
Troponin I (High Sensitivity): 51 ng/L — ABNORMAL HIGH (ref <18)
Troponin I (High Sensitivity): 52 ng/L — ABNORMAL HIGH (ref <18)

## 2022-05-18 LAB — RESP PANEL BY RT-PCR (FLU A&B, COVID) ARPGX2
Influenza A by PCR: NEGATIVE
Influenza B by PCR: NEGATIVE
SARS Coronavirus 2 by RT PCR: NEGATIVE

## 2022-05-18 LAB — LACTIC ACID, PLASMA: Lactic Acid, Venous: 3.2 mmol/L (ref 0.5–1.9)

## 2022-05-18 LAB — PROCALCITONIN: Procalcitonin: 5.12 ng/mL

## 2022-05-18 MED ORDER — METHYLPREDNISOLONE SODIUM SUCC 125 MG IJ SOLR
60.0000 mg | Freq: Two times a day (BID) | INTRAMUSCULAR | Status: DC
  Administered 2022-05-19: 60 mg via INTRAVENOUS
  Filled 2022-05-18: qty 2

## 2022-05-18 MED ORDER — IPRATROPIUM-ALBUTEROL 0.5-2.5 (3) MG/3ML IN SOLN
3.0000 mL | Freq: Four times a day (QID) | RESPIRATORY_TRACT | Status: DC
  Administered 2022-05-18 – 2022-05-20 (×9): 3 mL via RESPIRATORY_TRACT
  Filled 2022-05-18 (×9): qty 3

## 2022-05-18 MED ORDER — GUAIFENESIN ER 600 MG PO TB12
600.0000 mg | ORAL_TABLET | Freq: Two times a day (BID) | ORAL | Status: DC | PRN
  Administered 2022-05-19: 600 mg via ORAL
  Filled 2022-05-18: qty 1

## 2022-05-18 MED ORDER — SODIUM CHLORIDE 0.9 % IV BOLUS
500.0000 mL | Freq: Once | INTRAVENOUS | Status: AC
  Administered 2022-05-18: 500 mL via INTRAVENOUS

## 2022-05-18 MED ORDER — SODIUM CHLORIDE 0.9 % IV SOLN
500.0000 mg | INTRAVENOUS | Status: AC
  Administered 2022-05-18: 500 mg via INTRAVENOUS
  Filled 2022-05-18: qty 5

## 2022-05-18 MED ORDER — AZITHROMYCIN 500 MG PO TABS
500.0000 mg | ORAL_TABLET | Freq: Every day | ORAL | Status: AC
  Administered 2022-05-19 – 2022-05-22 (×4): 500 mg via ORAL
  Filled 2022-05-18 (×4): qty 1

## 2022-05-18 MED ORDER — IPRATROPIUM-ALBUTEROL 0.5-2.5 (3) MG/3ML IN SOLN
3.0000 mL | Freq: Once | RESPIRATORY_TRACT | Status: AC
  Administered 2022-05-18: 3 mL via RESPIRATORY_TRACT
  Filled 2022-05-18: qty 3

## 2022-05-18 MED ORDER — METHYLPREDNISOLONE SODIUM SUCC 125 MG IJ SOLR
125.0000 mg | INTRAMUSCULAR | Status: AC
  Administered 2022-05-18: 125 mg via INTRAVENOUS
  Filled 2022-05-18: qty 2

## 2022-05-18 MED ORDER — PREDNISONE 20 MG PO TABS: 40.0000 mg | ORAL_TABLET | Freq: Every day | ORAL | Status: DC

## 2022-05-18 MED ORDER — LACTATED RINGERS IV SOLN: INTRAVENOUS | Status: DC

## 2022-05-18 MED ORDER — SODIUM CHLORIDE 0.9 % IV SOLN
2.0000 g | Freq: Once | INTRAVENOUS | Status: AC
  Administered 2022-05-18: 2 g via INTRAVENOUS
  Filled 2022-05-18: qty 20

## 2022-05-18 MED ORDER — SODIUM CHLORIDE 0.9% FLUSH
3.0000 mL | Freq: Two times a day (BID) | INTRAVENOUS | Status: DC
  Administered 2022-05-19 – 2022-05-26 (×14): 3 mL via INTRAVENOUS

## 2022-05-18 MED ORDER — HYDRALAZINE HCL 20 MG/ML IJ SOLN: 5.0000 mg | INTRAMUSCULAR | Status: DC | PRN

## 2022-05-18 MED ORDER — ALBUTEROL SULFATE (2.5 MG/3ML) 0.083% IN NEBU
2.5000 mg | INHALATION_SOLUTION | RESPIRATORY_TRACT | Status: DC | PRN
  Administered 2022-05-19: 2.5 mg via RESPIRATORY_TRACT
  Filled 2022-05-18 (×2): qty 3

## 2022-05-18 MED ORDER — AZITHROMYCIN 500 MG PO TABS
500.0000 mg | ORAL_TABLET | Freq: Once | ORAL | Status: DC
  Filled 2022-05-18: qty 1

## 2022-05-18 NOTE — ED Notes (Signed)
IV team at bedside 

## 2022-05-18 NOTE — ED Provider Notes (Signed)
[facility] Provider Note    Event Date/Time   First MD Initiated Contact with Patient 05/18/22 1707     (approximate)   History   Shortness of Breath   HPI  Stephanie [name] is a 86 y.o. female history of stroke COPD depression coronary disease previous DVT with hypoxic respiratory failure and COPD exacerbation admitted on August 7   Patient and her family reports that she has been having nasal congestion cough occasionally productive and increased shortness of breath for couple days.  Saw Dr. Wilburn Mylar and started on Ceftin and was called and told by her doctor's office that they saw pneumonia on a chest x-ray.  Additionally, today she started noticing more wheezing and noticed her oxygen levels were running low when she tries to move about the house running as low as in the mid 70s with dyspnea.  Used a nebulizer treatment earlier today without much relief.  Still feels short of breath  No chest pain.  No fevers or chills.  No nausea or vomiting.  She is on her anticoagulant and she takes it twice a day.  Also she feels she is a bit dehydrated with decreased appetite, and was told by her doctor's office that they were concerned she may need IV fluids  Physical Exam   Triage Vital Signs: ED Triage Vitals  Enc Vitals Group     BP 05/18/22 1520 113/66     Pulse Rate 05/18/22 1520 75     Resp 05/18/22 1520 (!) 26     Temp 05/18/22 1520 97.8 F (36.6 C)     Temp src --      SpO2 05/18/22 1520 94 %     Weight 05/18/22 1521 117 lb (53.1 kg)     Height 05/18/22 1521 5\' 2"  (1.575 m)     Head Circumference --      Peak Flow --      Pain Score 05/18/22 1521 0     Pain Loc --      Pain Edu? --      Excl. in [place]? --     Most recent vital signs: Vitals:   05/18/22 1520  BP: 113/66  Pulse: 75  Resp: (!) 26  Temp: 97.8 F (36.6 C)  SpO2: 94%     General: Awake, no distress except she has mild accessory muscle use and tachypnea and speaks in  phrases.  She is pleasant CV:  Good peripheral perfusion.  Normal heart tones Resp:  Mild tachypnea, speaks in phrases.  Oxygen saturation 94% on 2 L nasal cannula which is her baseline.  She has moderate expiratory wheezing through all fields.  No focal crackles.  She is not an extremis but does appear to have increased work of breathing Abd:  No distention.  Soft nontender nondistended Other:  No edema   ED Results / Procedures / Treatments   Labs (all labs ordered are listed, but only abnormal results are displayed) Labs Reviewed  BASIC METABOLIC PANEL - Abnormal; Notable for the following components:      Result Value   Glucose, Bld 105 (*)    BUN 53 (*)    Creatinine, Ser 2.23 (*)    GFR, Estimated 21 (*)    All other components within normal limits  CBC - Abnormal; Notable for the following components:   RBC 3.28 (*)    Hemoglobin 10.1 (*)    HCT 31.6 (*)    All other components within normal  limits  TROPONIN I (HIGH SENSITIVITY) - Abnormal; Notable for the following components:   Troponin I (High Sensitivity) 51 (*)    All other components within normal limits  CULTURE, BLOOD (ROUTINE X 2)  CULTURE, BLOOD (ROUTINE X 2)  RESP PANEL BY RT-PCR (FLU A&B, COVID) ARPGX2  LACTIC ACID, PLASMA  PROCALCITONIN  TROPONIN I (HIGH SENSITIVITY)   I am suspicious that this elevated troponin is likely secondary to potential demand ischemia, does not seem to be in a territory suggestive of ACS or NSTEMI at this time.  EKG  Interpreted by me at 1535 heart rate 90 QRS 80 QTc 440 Normal sinus rhythm, mild nonspecific T wave abnormality.   RADIOLOGY Chest x-ray interpreted by me and appears that there is there is scarring along the right lower lobe or potentially interstitial abnormality.  No obvious large infiltrate is noted though  DG Chest 2 View  Result Date: 05/18/2022 CLINICAL DATA:  Worsening shortness of breath EXAM: CHEST - 2 VIEW COMPARISON:  Chest radiograph March 14, 2022  FINDINGS: No pleural effusion. No pneumothorax. Loop recorder in place. Calcific plaque in the thoracic and abdominal aorta. Slightly improved right basilar pulmonary opacity, which now predominantly appears interstitial in nature. Unchanged cardiac and mediastinal contours. No displaced rib fractures. Vertebral body heights are maintained. Degenerative changes of the bilateral AC joints. IMPRESSION: Slightly improved right basilar pulmonary opacity, which now predominantly appears interstitial in nature. This is nonspecific, but may represent scarring in the setting of chronic aspiration. Electronically Signed   By: Marin Roberts M.D.   On: 05/18/2022 15:52      PROCEDURES:  Critical Care performed: Yes, see critical care procedure note(s)  CRITICAL CARE Performed by: Delman Kitten   Total critical care time: 30 minutes  Critical care time was exclusive of separately billable procedures and treating other patients.  Critical care was necessary to treat or prevent imminent or life-threatening deterioration.  Critical care was time spent personally by me on the following activities: development of treatment plan with patient and/or surrogate as well as nursing, discussions with consultants, evaluation of patient's response to treatment, examination of patient, obtaining history from patient or surrogate, ordering and performing treatments and interventions, ordering and review of laboratory studies, ordering and review of radiographic studies, pulse oximetry and re-evaluation of patient's condition.   Procedures   MEDICATIONS ORDERED IN ED: Medications  methylPREDNISolone sodium succinate (SOLU-MEDROL) 125 mg/2 mL injection 125 mg (has no administration in time range)  cefTRIAXone (ROCEPHIN) 2 g in sodium chloride 0.9 % 100 mL IVPB (has no administration in time range)  azithromycin (ZITHROMAX) tablet 500 mg (has no administration in time range)  sodium chloride 0.9 % bolus 500 mL (has no  administration in time range)  ipratropium-albuterol (DUONEB) 0.5-2.5 (3) MG/3ML nebulizer solution 3 mL (3 mLs Nebulization Given 05/18/22 1733)  ipratropium-albuterol (DUONEB) 0.5-2.5 (3) MG/3ML nebulizer solution 3 mL (3 mLs Nebulization Given 05/18/22 1733)  ipratropium-albuterol (DUONEB) 0.5-2.5 (3) MG/3ML nebulizer solution 3 mL (3 mLs Nebulization Given 05/18/22 1733)     IMPRESSION / MDM / ASSESSMENT AND PLAN / ED COURSE  I reviewed the triage vital signs and the nursing notes.                              Differential diagnosis includes, but is not limited to, COPD exacerbation, pneumonia, viral infection, bronchitis, ACS, demand ischemia, etc.  Imaging reassuring, no obvious large infiltrate, will continue  treatment for and with antibiotic for possible pneumonia but also aggressive treatment for COPD exacerbation.  She is compliant with oral anticoagulation, is not having any associated chest pain and given her AKI she would not be a good candidate for CT imaging to exclude pulmonary embolism.  However I do not think this is necessary at this time she is responded well to treatment and has a clinical history and findings suggestive of COPD exacerbation with underlying pneumonia versus upper respiratory infection as a likely driver  I consulted with her hospitalist Dr. Girard Cooter who is agreeable with plan for admission.  Patient and family understanding agreeable with plan for admission.  Patient's presentation is most consistent with acute presentation with potential threat to life or bodily function.  The patient is on the cardiac monitor to evaluate for evidence of arrhythmia and/or significant heart rate changes.  Labs notable for acute kidney injury, in addition I suspect this is likely prerenal as patient reports decreased appetite and poor oral intake over the last couple of days.  Will hydrate and this will continue to be followed clinically by the hospitalist service  Clinical  Course as of 05/18/22 1845  Wed May 18, 2022  1837 Work of breathing is improving.  Patient reports shortness of breath improving.  She appears more comfortable respiratory rate approximately 18.  Very mild expiratory wheezing at this time but work of breathing appears notably improved. [MQ]    Clinical Course User Index [MQ] Delman Kitten, MD     FINAL CLINICAL IMPRESSION(S) / ED DIAGNOSES   Final diagnoses:  COPD exacerbation ([place])  AKI (acute kidney injury) ([place])     Rx / DC Orders   ED Discharge Orders     None        Note:  This document was prepared using Dragon voice recognition software and may include unintentional dictation errors.   Delman Kitten, MD 05/18/22 [id number]

## 2022-05-18 NOTE — H&P (Signed)
History and Physical    Chief Complaint: Shortness of breath   HISTORY OF PRESENT ILLNESS: Stephanie [name] is an 86 y.o. female seen in the emergency room with complaints of shortness of breath with occasional cough runny nose and nasal congestion. Patient went to her primary care was given Ceftin and was told to that she probably has pneumonia.  Today patient is wheezing on her oxygen level was trying to have decrease was dropping low while ambulating. Patient otherwise denies any chest pain palpitations any other complaints.   Pt has PMH as below: Past Medical History:  Diagnosis Date   Arthritis    Baker's cyst of knee, right    a. 08/2016 U/S: Baker's cyst noted in R popliteal fossa.   Complication of anesthesia    COPD (chronic obstructive pulmonary disease) ([place])    a. Home O2 started 08/2017.   Coronary artery disease    a. prior h/o stenting ~ 2005.   Depression    Edema    Expressive aphasia    GI bleed 11/14/2013   History of cardiac monitoring    a. 08/2016 s/p LINQ placement following CVA. No AFib to date (09/2017).   History of pneumonia    Hx of ischemic left MCA stroke    a. 08/2016 large posterior L MCA territory infarct in the setting of LLE DVT and suggestion of PFO on transcranial dopplers w/ bubble study (no PFO reported on echocardiogram)-->Eliquis.   Hypertension    Left leg DVT ([place])    a. 08/2016 U/S: subacute DVT in prox L popliteal vein w/ chronic superficial thrombosis in the L greater saphenous vein from the mid to prox L calf-->Eliquis.   PFO (patent foramen ovale)    a. 08/2016 Abnl TCD w/ bubble study suggesting large PFO. Not reported on echocardiogram.   PONV (postoperative nausea and vomiting)      Review of Systems  Constitutional:  Positive for diaphoresis and fatigue.      Allergies  Allergen Reactions   Duloxetine Hcl Other (See Comments)    Altered Mental Status   Influenza Vaccines Swelling    At injection site   Venlafaxine  Nausea And Vomiting     Past Surgical History:  Procedure Laterality Date   BALLOON ANGIOPLASTY, ARTERY  05/17/2009   CATARACT EXTRACTION W/PHACO Left 05/24/2016   Procedure: CATARACT EXTRACTION PHACO AND INTRAOCULAR LENS PLACEMENT ([place]);  Surgeon: Birder Robson, MD;  Location: [facility] ORS;  Service: Ophthalmology;  Laterality: Left;  Lot# [id number] H [place]:   00:52.3 AP%:   27.3 CDE:  14.26    CATARACT EXTRACTION W/PHACO Right 06/14/2016   Procedure: CATARACT EXTRACTION PHACO AND INTRAOCULAR LENS PLACEMENT ([place]);  Surgeon: Birder Robson, MD;  Location: [facility] ORS;  Service: Ophthalmology;  Laterality: Right;  Lot# [id number] H [place]: 01:13.4 AP%: 24.4 CDE: 17.85   CORONARY ANGIOPLASTY     STENT   EP IMPLANTABLE DEVICE N/A 08/19/2016   Procedure: Loop Recorder Insertion;  Surgeon: Will Meredith Leeds, MD;  Location: [facility];  Service: Cardiovascular;  Laterality: N/A;      Social History   Socioeconomic History   Marital status: Widowed    Spouse name: Not on file   Number of children: 4   Years of education: Not on file   Highest education level: 12th grade  Occupational History   Occupation: retired  Tobacco Use   Smoking status: Former    Types: Cigarettes    Quit date: 08/07/2001    Years since quitting:  20.7   Smokeless tobacco: Never  Vaping Use   Vaping Use: Never used  Substance and Sexual Activity   Alcohol use: No    Alcohol/week: 0.0 standard drinks of alcohol   Drug use: No   Sexual activity: Not on file  Other Topics Concern   Not on file  Social History Narrative   Lives alone   Social Determinants of Health   Financial Resource Strain: Low Risk  (09/08/2020)   Overall Financial Resource Strain (CARDIA)    Difficulty of Paying Living Expenses: Not hard at all  Food Insecurity: No Food Insecurity (09/08/2020)   Hunger Vital Sign    Worried About Running Out of Food in the Last Year: Never true    [place] in the Last Year: Never true   Transportation Needs: No Transportation Needs (09/08/2020)   PRAPARE - Hydrologist (Medical): No    Lack of Transportation (Non-Medical): No  Physical Activity: Inactive (09/08/2020)   Exercise Vital Sign    Days of Exercise per Week: 0 days    Minutes of Exercise per Session: 0 min  Stress: No Stress Concern Present (09/08/2020)   [place]    Feeling of Stress : Only a little  Social Connections: Socially Isolated (09/08/2020)   Social Connection and Isolation Panel [NHANES]    Frequency of Communication with Friends and Family: Patient refused    Frequency of Social Gatherings with Friends and Family: More than three times a week    Attends Religious Services: Never    Marine scientist or Organizations: No    Attends Archivist Meetings: Never    Marital Status: Widowed      CURRENT MEDS:  Current Facility-Administered Medications (Endocrine & Metabolic):    methylPREDNISolone sodium succinate (SOLU-MEDROL) 125 mg/2 mL injection 60 mg **IN FOLLOWED-BY LINKED GROUP WITH** [START ON 05/20/2022] predniSONE (DELTASONE) tablet 40 mg   Current Facility-Administered Medications (Cardiovascular):    hydrALAZINE (APRESOLINE) injection 5 mg  Current Outpatient Medications (Cardiovascular):    amLODipine (NORVASC) 5 MG tablet, TAKE 1 TABLET BY MOUTH EVERY DAY  Current Facility-Administered Medications (Respiratory):    albuterol (PROVENTIL) (2.5 MG/3ML) 0.083% nebulizer solution 2.5 mg   guaiFENesin (MUCINEX) 12 hr tablet 600 mg   ipratropium-albuterol (DUONEB) 0.5-2.5 (3) MG/3ML nebulizer solution 3 mL  Current Outpatient Medications (Respiratory):    Fluticasone-Umeclidin-Vilant (TRELEGY ELLIPTA) 100-62.5-25 MCG/ACT AEPB, Inhale 1 puff into the lungs at bedtime.   montelukast (SINGULAIR) 10 MG tablet, Take 10 mg by mouth at bedtime.   albuterol (PROAIR HFA) 108 (90 Base)  MCG/ACT inhaler, INHALE TWO PUFFS INTO LUNGS EVERY 6 HOURS AS NEEDED   loratadine (CLARITIN) 10 MG tablet, Take 10 mg by mouth daily.   SYMBICORT 160-4.5 MCG/ACT inhaler, INHALE TWO PUFFS INTO THE LUNGS TWICE A DAY (Patient not taking: Reported on 03/15/2022)   tiotropium (SPIRIVA HANDIHALER) 18 MCG inhalation capsule, Place 1 capsule (18 mcg total) into inhaler and inhale daily. One puff daily (Patient not taking: Reported on 03/15/2022)   Current Outpatient Medications (Analgesics):    acetaminophen (TYLENOL) 500 MG tablet, Take 1,000 mg by mouth 2 (two) times daily.    Current Outpatient Medications (Hematological):    apixaban (ELIQUIS) 2.5 MG TABS tablet, Take 1 tablet (2.5 mg total) by mouth 2 (two) times daily.   Fe-Succ Ac-B Cmplx-C-Ca-FA (IROSPAN 24/6) MISC, Take 1 tablet by mouth daily.  Current Facility-Administered Medications (Other):    [  COMPLETED] azithromycin (ZITHROMAX) 500 mg in sodium chloride 0.9 % 250 mL IVPB **IN FOLLOWED-BY LINKED GROUP WITH** azithromycin (ZITHROMAX) tablet 500 mg   lactated ringers bolus 250 mL   lactated ringers infusion   sodium chloride flush (NS) 0.9 % injection 3 mL  Current Outpatient Medications (Other):    Calcium Carb-Cholecalciferol 600-800 MG-UNIT TABS, Take 1 tablet by mouth daily.    cefUROXime (CEFTIN) 250 MG tablet, Take 250 mg by mouth in the morning and at bedtime.   docusate sodium (COLACE) 100 MG capsule, Take 200 mg by mouth at bedtime.   gabapentin (NEURONTIN) 100 MG capsule, TAKE 1 CAPSULE (100MG ) BY MOUTH TWICE DAILY (Patient taking differently: Take 100 mg by mouth 2 (two) times daily. TAKE 1 CAPSULE (100MG ) BY MOUTH TWICE DAILY)   Multiple Vitamin (MULTIVITAMIN) tablet, Take 1 tablet by mouth daily.   omeprazole (PRILOSEC) 20 MG capsule, TAKE 1 CAPSULE BY MOUTH EVERY DAY (Patient taking differently: Take 20 mg by mouth 2 (two) times daily before a meal.)   PARoxetine (PAXIL) 40 MG tablet, Take 40 mg by mouth daily.    Spacer/Aero Chamber Mouthpiece MISC, Use spacer on inhalers for better administration. (Patient not taking: Reported on 09/08/2020)    ED Course: Pt in Ed is alert/awake and NAD Vitals:   05/18/22 1930 05/18/22 2000 05/18/22 2030 05/18/22 2130  BP: 107/72 98/81 (!) 112/98 (!) 100/54  Pulse: (!) 123 (!) 106 (!) 112 (!) 101  Resp: (!) 28 17 (!) 26 (!) 31  Temp:    98.2 F (36.8 C)  SpO2: 96% 98% 95% 100%  Weight:      Height:       Total I/O In: 850 [IV Piggyback:850] Out: -  SpO2: 100 % O2 Flow Rate (L/min): 2 L/min Blood work in ed shows  Results for orders placed or performed during the hospital encounter of 05/18/22 (from the past 24 hour(s))  Basic metabolic panel     Status: Abnormal   Collection Time: 05/18/22  4:19 PM  Result Value Ref Range   Sodium 135 135 - 145 mmol/L   Potassium 4.2 3.5 - 5.1 mmol/L   Chloride 101 98 - 111 mmol/L   CO2 23 22 - 32 mmol/L   Glucose, Bld 105 (H) 70 - 99 mg/dL   BUN 53 (H) 8 - 23 mg/dL   Creatinine, Ser 2.23 (H) 0.44 - 1.00 mg/dL   Calcium 9.5 8.9 - 10.3 mg/dL   GFR, Estimated 21 (L) >60 mL/min   Anion gap 11 5 - 15  CBC     Status: Abnormal   Collection Time: 05/18/22  4:19 PM  Result Value Ref Range   WBC 10.1 4.0 - 10.5 K/uL   RBC 3.28 (L) 3.87 - 5.11 MIL/uL   Hemoglobin 10.1 (L) 12.0 - 15.0 g/dL   HCT 31.6 (L) 36.0 - 46.0 %   MCV 96.3 80.0 - 100.0 fL   MCH 30.8 26.0 - 34.0 pg   MCHC 32.0 30.0 - 36.0 g/dL   RDW 14.6 11.5 - 15.5 %   Platelets 249 150 - 400 K/uL   nRBC 0.0 0.0 - 0.2 %  Troponin I (High Sensitivity)     Status: Abnormal   Collection Time: 05/18/22  4:19 PM  Result Value Ref Range   Troponin I (High Sensitivity) 51 (H) <18 ng/L   In Ed pt received  Meds ordered this encounter  Medications   ipratropium-albuterol (DUONEB) 0.5-2.5 (3) MG/3ML nebulizer solution 3 mL  ipratropium-albuterol (DUONEB) 0.5-2.5 (3) MG/3ML nebulizer solution 3 mL   methylPREDNISolone sodium succinate (SOLU-MEDROL) 125 mg/2 mL  injection 125 mg   ipratropium-albuterol (DUONEB) 0.5-2.5 (3) MG/3ML nebulizer solution 3 mL   cefTRIAXone (ROCEPHIN) 2 g in sodium chloride 0.9 % 100 mL IVPB    Order Specific Question:   Antibiotic Indication:    Answer:   CAP   azithromycin (ZITHROMAX) tablet 500 mg   sodium chloride 0.9 % bolus 500 mL    Unresulted Labs (From admission, onward)     Start     Ordered   05/18/22 1712  Blood culture (routine x 2)  BLOOD CULTURE X 2,   STAT      05/18/22 1711   05/18/22 1712  Lactic acid, plasma  ONCE - STAT,   STAT        05/18/22 1711   05/18/22 1712  Procalcitonin - Baseline  ONCE - URGENT,   URGENT        05/18/22 1711   05/18/22 1712  Resp Panel by RT-PCR (Flu A&B, Covid) Anterior Nasal Swab  Once,   URGENT        05/18/22 1711             Admission Imaging : DG Chest 2 View  Result Date: 05/18/2022 CLINICAL DATA:  Worsening shortness of breath EXAM: CHEST - 2 VIEW COMPARISON:  Chest radiograph March 14, 2022 FINDINGS: No pleural effusion. No pneumothorax. Loop recorder in place. Calcific plaque in the thoracic and abdominal aorta. Slightly improved right basilar pulmonary opacity, which now predominantly appears interstitial in nature. Unchanged cardiac and mediastinal contours. No displaced rib fractures. Vertebral body heights are maintained. Degenerative changes of the bilateral AC joints. IMPRESSION: Slightly improved right basilar pulmonary opacity, which now predominantly appears interstitial in nature. This is nonspecific, but may represent scarring in the setting of chronic aspiration. Electronically Signed   By: Marin Roberts M.D.   On: 05/18/2022 15:52      Physical Examination: Vitals:   05/18/22 1930 05/18/22 2000 05/18/22 2030 05/18/22 2130  BP: 107/72 98/81 (!) 112/98 (!) 100/54  Pulse: (!) 123 (!) 106 (!) 112 (!) 101  Temp:    98.2 F (36.8 C)  Resp: (!) 28 17 (!) 26 (!) 31  Height:      Weight:      SpO2: 96% 98% 95% 100%  BMI (Calculated):        Physical Exam Vitals reviewed.  Constitutional:      Appearance: Normal appearance.  HENT:     Head: Normocephalic and atraumatic.     Right Ear: External ear normal.     Left Ear: External ear normal.     Nose: Nose normal.     Mouth/Throat:     Mouth: Mucous membranes are moist.  Eyes:     Extraocular Movements: Extraocular movements intact.     Pupils: Pupils are equal, round, and reactive to light.  Cardiovascular:     Rate and Rhythm: Normal rate and regular rhythm.     Pulses: Normal pulses.     Heart sounds: Normal heart sounds.  Pulmonary:     Effort: Pulmonary effort is normal.     Breath sounds: Normal breath sounds.  Abdominal:     General: Bowel sounds are normal. There is no distension.     Palpations: Abdomen is soft.     Tenderness: There is no abdominal tenderness. There is no guarding.  Neurological:     General: No focal  deficit present.     Mental Status: She is alert and oriented to person, place, and time.  Psychiatric:        Mood and Affect: Mood normal.        Behavior: Behavior normal.      Assessment and Plan: * Acute on chronic respiratory failure with hypoxia [facility]) Patient presenting with shortness of breath and brought by family. Attribute to COPD exacerbation. Differentials also include pneumonia. Patient went to primary care and was given antibiotic for diagnosis of pneumonia that the patient has started to take. We will continue with steroid taper and DuoNebs and MDI therapy.   Acute exacerbation of chronic obstructive pulmonary disease (COPD) (HCC) DuoNebs, Solu-Medrol with taper to p.o.   Acute renal failure superimposed on stage 3b chronic kidney disease ([place]) Lab Results  Component Value Date   CREATININE 2.23 (H) 05/18/2022   CREATININE 1.41 (H) 03/16/2022   CREATININE 1.34 (H) 03/15/2022  Attribute acute worsening secondary dehydration and decreased p.o. intake. We will also stop  Anemia Patient also has anemia that is  chronic with a hemoglobin of 10.1.    Latest Ref Rng & Units 05/18/2022    4:19 PM 03/16/2022    4:35 AM 03/15/2022    4:26 AM  CBC  WBC 4.0 - 10.5 K/uL 10.1  16.9  9.1   Hemoglobin 12.0 - 15.0 g/dL 10.1  10.7  9.5   Hematocrit 36.0 - 46.0 % 31.6  33.3  29.1   Platelets 150 - 400 K/uL 249  384  288   We will follow and type and cross and continue with IV PPI therapy transfuse as deemed appropriate.    Gastroduodenal ulcer Patient diagnosed with gastric duodenal ulcer past medical history. Advised to refrain from any and all NSAIDs. We will continue with IV PPI therapy.   Essential hypertension Vitals:   05/18/22 1520 05/18/22 1900 05/18/22 1930 05/18/22 2000  BP: 113/66 (!) 136/57 107/72 98/81   05/18/22 2030 05/18/22 2130  BP: (!) 112/98 (!) 100/54  Blood pressure ranges are low to within normal limit. We will hold off on blood pressure medication regimen that is scheduled and moved towards a as needed basis scheduled with hydralazine only.    DVT (deep venous thrombosis) ([place]) Patient has a history of DVT and is currently managed on Eliquis 2.5 mg 2 times a day.   Severe sepsis ([place]) Pt meeting severe sepsis criteria as she had PNA.  She was only partly treated with abx and wbc now currently negative but suspect pneumonia and copd exacerbation.  Lactic Acid, Venous    Component Value Date/Time   LATICACIDVEN 3.2 (HH) 05/18/2022 1900  we will given LR bolus 250 and then MIVF at 50 cc per hours.  Weights continue azithromycin and held rocephin.     DVT prophylaxis:  Eliquis  Code Status:  Full code  Family Communication:  Nance Pew (Daughter)  [phone] (Mobile   Disposition Plan:  Home    Consults called:  None   Admission status: Inpatient    Unit/ Expected LOS: Med tele/ progressive depending on bed.    Para Skeans MD [facility]  6 PM- 2 AM. Please contact me via secure Chat 6 PM-2 AM. [phone] ( Pager ) To contact the  [facility] Attending or Consulting provider [place] or covering provider during after hours [place], for this patient.   Check the care team in [facility] and look for a) attending/consulting [place] provider listed and b)  the [facility] team listed Log into [email or web address] and use [place]'s universal password to access. If you do not have the password, please contact the hospital operator. Locate the [facility] provider you are looking for under [facility] and page to a number that you can be directly reached. If you still have difficulty reaching the provider, please page the [facility] (Director on Call) for the Hospitalists listed on amion for assistance. [email or web address] 05/19/2022, 12:35 AM

## 2022-05-18 NOTE — ED Notes (Signed)
Dr. Patel at bedside 

## 2022-05-18 NOTE — ED Triage Notes (Signed)
Pt arrives with c/o SOB. Per family member, pt has been having issues with PNA for a few months. Pt sent over by MD for PNA. Pt wears 2L of O2.

## 2022-05-18 NOTE — ED Notes (Signed)
Beverlee Nims [phone] Jacqlyn Larsen [phone]  Both daughters

## 2022-05-19 ENCOUNTER — Encounter: Payer: Self-pay | Admitting: Internal Medicine

## 2022-05-19 DIAGNOSIS — Z9981 Dependence on supplemental oxygen: Secondary | ICD-10-CM | POA: Diagnosis not present

## 2022-05-19 DIAGNOSIS — I10 Essential (primary) hypertension: Secondary | ICD-10-CM

## 2022-05-19 DIAGNOSIS — K5900 Constipation, unspecified: Secondary | ICD-10-CM | POA: Diagnosis present

## 2022-05-19 DIAGNOSIS — K279 Peptic ulcer, site unspecified, unspecified as acute or chronic, without hemorrhage or perforation: Secondary | ICD-10-CM | POA: Diagnosis present

## 2022-05-19 DIAGNOSIS — I82432 Acute embolism and thrombosis of left popliteal vein: Secondary | ICD-10-CM

## 2022-05-19 DIAGNOSIS — E440 Moderate protein-calorie malnutrition: Secondary | ICD-10-CM | POA: Diagnosis present

## 2022-05-19 DIAGNOSIS — E872 Acidosis, unspecified: Secondary | ICD-10-CM

## 2022-05-19 DIAGNOSIS — N179 Acute kidney failure, unspecified: Secondary | ICD-10-CM

## 2022-05-19 DIAGNOSIS — D649 Anemia, unspecified: Secondary | ICD-10-CM

## 2022-05-19 DIAGNOSIS — N1832 Chronic kidney disease, stage 3b: Secondary | ICD-10-CM

## 2022-05-19 DIAGNOSIS — D509 Iron deficiency anemia, unspecified: Secondary | ICD-10-CM | POA: Diagnosis present

## 2022-05-19 DIAGNOSIS — J440 Chronic obstructive pulmonary disease with acute lower respiratory infection: Secondary | ICD-10-CM | POA: Diagnosis present

## 2022-05-19 DIAGNOSIS — J189 Pneumonia, unspecified organism: Secondary | ICD-10-CM | POA: Diagnosis present

## 2022-05-19 DIAGNOSIS — I129 Hypertensive chronic kidney disease with stage 1 through stage 4 chronic kidney disease, or unspecified chronic kidney disease: Secondary | ICD-10-CM | POA: Diagnosis present

## 2022-05-19 DIAGNOSIS — J181 Lobar pneumonia, unspecified organism: Principal | ICD-10-CM

## 2022-05-19 DIAGNOSIS — J441 Chronic obstructive pulmonary disease with (acute) exacerbation: Secondary | ICD-10-CM

## 2022-05-19 DIAGNOSIS — M1990 Unspecified osteoarthritis, unspecified site: Secondary | ICD-10-CM | POA: Diagnosis present

## 2022-05-19 DIAGNOSIS — Z66 Do not resuscitate: Secondary | ICD-10-CM | POA: Diagnosis not present

## 2022-05-19 DIAGNOSIS — Z86718 Personal history of other venous thrombosis and embolism: Secondary | ICD-10-CM | POA: Diagnosis not present

## 2022-05-19 DIAGNOSIS — Z1152 Encounter for screening for COVID-19: Secondary | ICD-10-CM | POA: Diagnosis not present

## 2022-05-19 DIAGNOSIS — I2510 Atherosclerotic heart disease of native coronary artery without angina pectoris: Secondary | ICD-10-CM | POA: Diagnosis present

## 2022-05-19 DIAGNOSIS — Z8673 Personal history of transient ischemic attack (TIA), and cerebral infarction without residual deficits: Secondary | ICD-10-CM | POA: Diagnosis not present

## 2022-05-19 DIAGNOSIS — J9621 Acute and chronic respiratory failure with hypoxia: Secondary | ICD-10-CM | POA: Diagnosis present

## 2022-05-19 DIAGNOSIS — F32A Depression, unspecified: Secondary | ICD-10-CM | POA: Diagnosis present

## 2022-05-19 DIAGNOSIS — Z7901 Long term (current) use of anticoagulants: Secondary | ICD-10-CM | POA: Diagnosis not present

## 2022-05-19 DIAGNOSIS — R4701 Aphasia: Secondary | ICD-10-CM | POA: Diagnosis present

## 2022-05-19 DIAGNOSIS — Z888 Allergy status to other drugs, medicaments and biological substances status: Secondary | ICD-10-CM | POA: Diagnosis not present

## 2022-05-19 DIAGNOSIS — Z6821 Body mass index (BMI) 21.0-21.9, adult: Secondary | ICD-10-CM | POA: Diagnosis not present

## 2022-05-19 LAB — LACTIC ACID, PLASMA: Lactic Acid, Venous: 3.6 mmol/L (ref 0.5–1.9)

## 2022-05-19 MED ORDER — MONTELUKAST SODIUM 10 MG PO TABS
10.0000 mg | ORAL_TABLET | Freq: Every day | ORAL | Status: DC
  Administered 2022-05-19 – 2022-05-25 (×7): 10 mg via ORAL
  Filled 2022-05-19 (×7): qty 1

## 2022-05-19 MED ORDER — FLUTICASONE FUROATE-VILANTEROL 100-25 MCG/ACT IN AEPB
1.0000 | INHALATION_SPRAY | Freq: Every day | RESPIRATORY_TRACT | Status: DC
  Administered 2022-05-19 – 2022-05-26 (×8): 1 via RESPIRATORY_TRACT
  Filled 2022-05-19: qty 28

## 2022-05-19 MED ORDER — APIXABAN 2.5 MG PO TABS
2.5000 mg | ORAL_TABLET | Freq: Two times a day (BID) | ORAL | Status: DC
  Administered 2022-05-19 – 2022-05-26 (×15): 2.5 mg via ORAL
  Filled 2022-05-19 (×15): qty 1

## 2022-05-19 MED ORDER — ADULT MULTIVITAMIN W/MINERALS CH
1.0000 | ORAL_TABLET | Freq: Every day | ORAL | Status: DC
  Administered 2022-05-19 – 2022-05-26 (×8): 1 via ORAL
  Filled 2022-05-19 (×8): qty 1

## 2022-05-19 MED ORDER — OYSTER SHELL CALCIUM/D3 500-5 MG-MCG PO TABS
1.0000 | ORAL_TABLET | Freq: Every day | ORAL | Status: DC
  Administered 2022-05-19 – 2022-05-26 (×8): 1 via ORAL
  Filled 2022-05-19 (×8): qty 1

## 2022-05-19 MED ORDER — AMLODIPINE BESYLATE 5 MG PO TABS
5.0000 mg | ORAL_TABLET | Freq: Every day | ORAL | Status: DC
  Administered 2022-05-19 – 2022-05-22 (×4): 5 mg via ORAL
  Filled 2022-05-19 (×5): qty 1

## 2022-05-19 MED ORDER — SODIUM CHLORIDE 0.9 % IV SOLN: INTRAVENOUS | Status: DC

## 2022-05-19 MED ORDER — PANTOPRAZOLE SODIUM 40 MG PO TBEC
40.0000 mg | DELAYED_RELEASE_TABLET | Freq: Every day | ORAL | Status: DC
  Administered 2022-05-19 – 2022-05-26 (×8): 40 mg via ORAL
  Filled 2022-05-19 (×8): qty 1

## 2022-05-19 MED ORDER — LACTATED RINGERS IV BOLUS
250.0000 mL | Freq: Once | INTRAVENOUS | Status: AC
  Administered 2022-05-19: 250 mL via INTRAVENOUS

## 2022-05-19 MED ORDER — METHYLPREDNISOLONE SODIUM SUCC 40 MG IJ SOLR
40.0000 mg | Freq: Every day | INTRAMUSCULAR | Status: DC
  Administered 2022-05-20 – 2022-05-21 (×2): 40 mg via INTRAVENOUS
  Filled 2022-05-19 (×3): qty 1

## 2022-05-19 MED ORDER — SODIUM CHLORIDE 0.9 % IV SOLN
2.0000 g | INTRAVENOUS | Status: AC
  Administered 2022-05-19 – 2022-05-22 (×4): 2 g via INTRAVENOUS
  Filled 2022-05-19: qty 2
  Filled 2022-05-19 (×2): qty 20
  Filled 2022-05-19: qty 2
  Filled 2022-05-19: qty 20

## 2022-05-19 MED ORDER — UMECLIDINIUM BROMIDE 62.5 MCG/ACT IN AEPB
1.0000 | INHALATION_SPRAY | Freq: Every day | RESPIRATORY_TRACT | Status: DC
  Administered 2022-05-19 – 2022-05-26 (×8): 1 via RESPIRATORY_TRACT
  Filled 2022-05-19 (×2): qty 7

## 2022-05-19 MED ORDER — GABAPENTIN 100 MG PO CAPS
100.0000 mg | ORAL_CAPSULE | Freq: Two times a day (BID) | ORAL | Status: DC
  Administered 2022-05-19 – 2022-05-26 (×15): 100 mg via ORAL
  Filled 2022-05-19 (×15): qty 1

## 2022-05-19 MED ORDER — LORATADINE 10 MG PO TABS
10.0000 mg | ORAL_TABLET | Freq: Every day | ORAL | Status: DC
  Administered 2022-05-19 – 2022-05-26 (×8): 10 mg via ORAL
  Filled 2022-05-19 (×8): qty 1

## 2022-05-19 NOTE — Assessment & Plan Note (Addendum)
Continue nebulizer treatments and inhalers.  Restarted Solu-Medrol today with a lot of upper airway congestion and upper airway wheeze.

## 2022-05-19 NOTE — Evaluation (Signed)
Physical Therapy Evaluation Patient Details Name: Stephanie [name] MRN: [id number] DOB: 1936/03/04 Today's Date: 05/19/2022  History of Present Illness  Stephanie [name] is an 86 y.o. female seen in the emergency room with complaints of shortness of breath with occasional cough runny nose and nasal congestion. Patient went to her primary care was given Ceftin and was told to that she probably has pneumonia. Today patient is wheezing on her oxygen level was trying to have decrease was dropping low while ambulating.   Clinical Impression  Pt admitted with above diagnosis. Pt received supine in bed agreeable to PT eval. Daughter at bedside assists in subjective reports due to expressive difficulties. Pt still lives alone, indep to mod-I with SPC in household and community. Family assists PRN, primarily with meals.    To date, pt able to transfer to EOB with supervision with bed slightly elevated head. Notable seated rest break pr to standing due to increased WOB. Spo2 maintained > 90% on 2 L/min but significant wheezing noted with DOE. X1 STS without AD for ~30 sec before needing seated rest. Pt returned to supine in bed. Educated on LE therex for blood circulation and strengthening. Increased time needed for HR to recover from mid 130's with standing to low 120's at rest. All needs in reach. Pt currently with functional limitations due to the deficits listed below (see PT Problem List). Pt will benefit from skilled PT to increase their independence and safety with mobility to allow discharge to the venue listed below.          Recommendations for follow up therapy are one component of a multi-disciplinary discharge planning process, led by the attending physician.  Recommendations may be updated based on patient status, additional functional criteria and insurance authorization.  Follow Up Recommendations Skilled nursing-short term rehab (<3 hours/day) Can patient physically be transported  by private vehicle: No    Assistance Recommended at Discharge Frequent or constant Supervision/Assistance  Patient can return home with the following  A little help with walking and/or transfers;A little help with bathing/dressing/bathroom;Assistance with cooking/housework;Assist for transportation;Help with stairs or ramp for entrance    Equipment Recommendations Other (comment) (deferred to other venue of care)  Recommendations for Other Services       Functional Status Assessment Patient has had a recent decline in their functional status and demonstrates the ability to make significant improvements in function in a reasonable and predictable amount of time.     Precautions / Restrictions Precautions Precautions: Fall Restrictions Weight Bearing Restrictions: No      Mobility  Bed Mobility Overal bed mobility: Needs Assistance Bed Mobility: Supine to Sit, Sit to Supine     Supine to sit: Supervision, HOB elevated       Patient Response: Cooperative  Transfers Overall transfer level: Needs assistance Equipment used: None Transfers: Sit to/from Stand Sit to Stand: Min guard           General transfer comment: Overall flexed posture with limited hip extension.    Ambulation/Gait               General Gait Details: Deferred due to WOB  Stairs            Wheelchair Mobility    Modified Rankin (Stroke Patients Only)       Balance Overall balance assessment: Needs assistance Sitting-balance support: No upper extremity supported, Feet supported Sitting balance-Leahy Scale: Fair     Standing balance support: No upper extremity supported Standing balance-Leahy Scale:  Fair Standing balance comment: flexed posture, no UE support. Limited tolerance due to weakness and WOB                             Pertinent Vitals/Pain Pain Assessment Pain Assessment: No/denies pain    Home Living Family/patient expects to be discharged to::  Private residence Living Arrangements: Alone Available Help at Discharge: Family Type of Home: Apartment Home Access: Level entry       Home Layout: One level Home Equipment: Cane - single Barista (2 wheels);Tub bench      Prior Function Prior Level of Function : Independent/Modified Independent;Patient poor historian/Family not available             Mobility Comments: daughter reorts pt no AD inside home. Did rely on RW a few days prior to admission due to weakness. Uses SPC in community primarily. ADLs Comments: Daughter reports family PRN mainly for driving and meals.     Hand Dominance   Dominant Hand: Right    Extremity/Trunk Assessment   Upper Extremity Assessment Upper Extremity Assessment: Defer to OT evaluation    Lower Extremity Assessment Lower Extremity Assessment: Generalized weakness    Cervical / Trunk Assessment Cervical / Trunk Assessment: Normal  Communication   Communication: Expressive difficulties  Cognition Arousal/Alertness: Awake/alert Behavior During Therapy: WFL for tasks assessed/performed Overall Cognitive Status: Within Functional Limits for tasks assessed                                          General Comments General comments (skin integrity, edema, etc.): SPO2 maintained >90% throughout activity. Max HR 131 BPM with standing. Returns to 115-121 BPM at rest.    Exercises Other Exercises Other Exercises: Role of PT in acute setting, d/c recs, LE therex for blood circulation and strengthening Other Exercises: ankle pumps, hip abduction, heel slides x10/LE every couple hours   Assessment/Plan    PT Assessment Patient needs continued PT services  PT Problem List Decreased strength;Decreased mobility;Decreased activity tolerance;Cardiopulmonary status limiting activity       PT Treatment Interventions DME instruction;Therapeutic exercise;Gait training;Balance training;Neuromuscular  re-education;Therapeutic activities;Patient/family education;Functional mobility training    PT Goals (Current goals can be found in the Care Plan section)  Acute Rehab PT Goals Patient Stated Goal: improve breathing, get stronger, get home PT Goal Formulation: With patient/family Time For Goal Achievement: 06/02/22 Potential to Achieve Goals: Fair    Frequency Min 2X/week     Co-evaluation               AM-PAC PT "6 Clicks" Mobility  Outcome Measure Help needed turning from your back to your side while in a flat bed without using bedrails?: None Help needed moving from lying on your back to sitting on the side of a flat bed without using bedrails?: A Little Help needed moving to and from a bed to a chair (including a wheelchair)?: A Lot Help needed standing up from a chair using your arms (e.g., wheelchair or bedside chair)?: A Little Help needed to walk in hospital room?: A Lot Help needed climbing 3-5 steps with a railing? : A Lot 6 Click Score: 16    End of Session Equipment Utilized During Treatment: Oxygen Activity Tolerance: Treatment limited secondary to medical complications (Comment) Patient left: in bed;with call bell/phone within reach;with bed alarm set;with family/visitor  present Nurse Communication: Mobility status PT Visit Diagnosis: Other abnormalities of gait and mobility (R26.89);Muscle weakness (generalized) (M62.81);Difficulty in walking, not elsewhere classified (R26.2);History of falling (Z91.81)    Time: 6659-9357 PT Time Calculation (min) (ACUTE ONLY): 26 min   Charges:   PT Evaluation $PT Eval Moderate Complexity: 1 Mod PT Treatments $Therapeutic Exercise: 8-22 mins        Junah Yam M. Fairly IV, PT, DPT Physical Therapist- [facility]  05/19/2022, 10:30 AM

## 2022-05-19 NOTE — Assessment & Plan Note (Deleted)
Pt meeting severe sepsis criteria as she had PNA.  She was only partly treated with abx and wbc now currently negative but suspect pneumonia and copd exacerbation.  Lactic Acid, Venous    Component Value Date/Time   LATICACIDVEN 3.2 (HH) 05/18/2022 1900  we will given LR bolus 250 and then MIVF at 50 cc per hours.  Weights continue azithromycin and held rocephin.

## 2022-05-19 NOTE — Assessment & Plan Note (Deleted)
Last hemoglobin 9.4.  Check iron studies added on to morning labs

## 2022-05-19 NOTE — Assessment & Plan Note (Addendum)
-   Continue Eliquis 

## 2022-05-19 NOTE — Progress Notes (Signed)
  Progress Note   Patient: Stephanie [name] RCV:[id number] DOB: [birth year] DOA: 05/18/2022     0 DOS: the patient was seen and examined on 05/19/2022     Assessment and Plan: * Acute on chronic respiratory failure with hypoxia [facility]) ER physician documented an increase use of accessory muscles to breathe and increased work of breathing.  Patient currently on 2 L of oxygen.  Acute exacerbation of chronic obstructive pulmonary disease (COPD) ([place]) Patient received 60 mg of Solu-Medrol this morning.  Will change to 40 mg of Solu-Medrol daily.  Continue nebulizer treatments and inhalers.  Lobar pneumonia ([place]) Right lobar pneumonia.  Continue Rocephin and Zithromax.  Sepsis ruled out.  Acute renal failure superimposed on stage 3b chronic kidney disease (HCC) Creatinine 2.23 on presentation.  Baseline creatinine 1.41.  Gentle IV fluids.  Lactic acidosis Gentle IV fluids.  Anemia Last hemoglobin 10.1.  Check iron studies tomorrow    Gastroduodenal ulcer Oral Protonix.   Essential hypertension Continue Norvasc   DVT (deep venous thrombosis) (HCC) Continue Eliquis        Subjective: Patient feels a little bit better than when she came in.  Still has some short of breath and cough.  Still having wheezing.  Physical Exam: Vitals:   05/19/22 0640 05/19/22 0700 05/19/22 1000 05/19/22 1100  BP: 137/61 (!) 121/58 132/68 (!) 130/56  Pulse: 90 91 (!) 111 (!) 104  Resp: 19 14 20 16   Temp:   98.5 F (36.9 C)   TempSrc:   Oral   SpO2: 98% 99% 96% 99%  Weight:      Height:       Physical Exam HENT:     Head: Normocephalic.     Mouth/Throat:     Pharynx: No oropharyngeal exudate.  Eyes:     General: Lids are normal.     Conjunctiva/sclera: Conjunctivae normal.  Cardiovascular:     Rate and Rhythm: Normal rate and regular rhythm.     Heart sounds: Normal heart sounds, S1 normal and S2 normal.  Pulmonary:     Breath sounds: Examination of the right-middle field  reveals decreased breath sounds and wheezing. Examination of the left-middle field reveals decreased breath sounds and wheezing. Examination of the right-lower field reveals decreased breath sounds and wheezing. Examination of the left-lower field reveals decreased breath sounds and wheezing. Decreased breath sounds and wheezing present. No rhonchi or rales.  Abdominal:     Palpations: Abdomen is soft.     Tenderness: There is no abdominal tenderness.  Musculoskeletal:     Right lower leg: No swelling.     Left lower leg: No swelling.  Neurological:     Mental Status: She is alert.     Data Reviewed: COVID test negative Creatinine 2.23, hemoglobin 10.1, lactic acid 3.6, procalcitonin 5.12  Family Communication: Spoke with patient's daughter on the phone  Disposition: Status is: Inpatient Remains inpatient appropriate because: Requiring IV antibiotics and IV Solu-Medrol for COPD exacerbation pneumonia  Planned Discharge Destination: Rehab    Time spent: 28 minutes  Author: Loletha Grayer, MD 05/19/2022 1:42 PM  For on call review [email or web address].

## 2022-05-19 NOTE — Evaluation (Signed)
Occupational Therapy Evaluation Patient Details Name: Stephanie [name] MRN: [id number] DOB: 15-Aug-1935 Today's Date: 05/19/2022   History of Present Illness Stephanie [name] is an 86 y.o. female seen in the emergency room with complaints of shortness of breath with occasional cough runny nose and nasal congestion. Patient went to her primary care was given Ceftin and was told to that she probably has pneumonia. Today patient is wheezing on her oxygen level was trying to have decrease was dropping low while ambulating.   Clinical Impression   Patient presenting with decreased independence in self-care, functional mobility, safety, and endurance. Patient with expressive difficulties. Patient lives alone and reports she does not use any AD in the home, but uses SPC in the community. Patient currently functioning at supervision/ min guard  for bed mobility; requiring breaks before attempting to stand due to SOB. SP02 at 93% HR at 130 while sitting EOB. Patient was able to sit >stand without AD with supervision/ min guard, requiring another rest break. Patient required VC for breathing techniques. Patient returned to bed  RW at 120 in supine. Patient left in bed with call bell in reach, bed alarm set, and all needs met.  Patient will benefit from acute OT to increase overall independence in the areas of ADLs, functional mobility,  in order to safely discharge to the next venue of care.       Recommendations for follow up therapy are one component of a multi-disciplinary discharge planning process, led by the attending physician.  Recommendations may be updated based on patient status, additional functional criteria and insurance authorization.   Follow Up Recommendations  Skilled nursing-short term rehab (<3 hours/day)    Assistance Recommended at Discharge Frequent or constant Supervision/Assistance  Patient can return home with the following A little help with walking and/or transfers;A  little help with bathing/dressing/bathroom;Help with stairs or ramp for entrance;Assist for transportation;Assistance with cooking/housework    Functional Status Assessment  Patient has had a recent decline in their functional status and demonstrates the ability to make significant improvements in function in a reasonable and predictable amount of time.  Equipment Recommendations  Other (comment) (Defer to next venue of care.)       Precautions / Restrictions Precautions Precautions: Fall Restrictions Weight Bearing Restrictions: No      Mobility Bed Mobility Overal bed mobility: Needs Assistance Bed Mobility: Supine to Sit, Sit to Supine     Supine to sit: Supervision, HOB elevated Sit to supine: Supervision, Min guard   General bed mobility comments: required assistance to    Transfers Overall transfer level: Needs assistance Equipment used: None Transfers: Sit to/from Stand Sit to Stand: Min guard                  Balance Overall balance assessment: Needs assistance Sitting-balance support: No upper extremity supported, Feet supported Sitting balance-Leahy Scale: Fair     Standing balance support: No upper extremity supported Standing balance-Leahy Scale: Fair                             ADL either performed or assessed with clinical judgement     Pertinent Vitals/Pain Pain Assessment Pain Assessment: No/denies pain        Extremity/Trunk Assessment Upper Extremity Assessment Upper Extremity Assessment: Generalized weakness   Lower Extremity Assessment Lower Extremity Assessment: Generalized weakness   Cervical / Trunk Assessment Cervical / Trunk Assessment: Normal   Communication Communication Communication: Expressive  difficulties   Cognition Arousal/Alertness: Awake/alert Behavior During Therapy: WFL for tasks assessed/performed Overall Cognitive Status: Within Functional Limits for tasks assessed                                                   Home Living Family/patient expects to be discharged to:: Private residence Living Arrangements: Alone Available Help at Discharge: Family Type of Home: Apartment Home Access: Level entry     Home Layout: One level     Bathroom Shower/Tub: Teacher, early years/pre: Standard     Home Equipment: Cane - single Barista (2 wheels);Tub bench          Prior Functioning/Environment Prior Level of Function : Independent/Modified Independent;Patient poor historian/Family not available             Mobility Comments: daughter reorts pt no AD inside home. Did rely on RW a few days prior to admission due to weakness. Uses SPC in community primarily. ADLs Comments: Daughter reports family PRN mainly for driving and meals.        OT Problem List: Decreased strength;Cardiopulmonary status limiting activity;Decreased activity tolerance;Decreased safety awareness         OT Goals(Current goals can be found in the care plan section) Acute Rehab OT Goals Patient Stated Goal: to return home. OT Goal Formulation: With patient Time For Goal Achievement: 06/02/22 Potential to Achieve Goals: Good ADL Goals Pt Will Perform Grooming: with modified independence Pt Will Perform Lower Body Bathing: with modified independence Pt Will Perform Lower Body Dressing: with modified independence Pt Will Transfer to Toilet: with modified independence Pt Will Perform Toileting - Clothing Manipulation and hygiene: with modified independence  OT Frequency: Min 2X/week       AM-PAC OT "6 Clicks" Daily Activity     Outcome Measure Help from another person eating meals?: None Help from another person taking care of personal grooming?: None Help from another person toileting, which includes using toliet, bedpan, or urinal?: A Little Help from another person bathing (including washing, rinsing, drying)?: A Little Help from another person to  put on and taking off regular upper body clothing?: None Help from another person to put on and taking off regular lower body clothing?: A Little 6 Click Score: 21   End of Session Nurse Communication: Mobility status  Activity Tolerance: Patient limited by fatigue Patient left: in bed;with call bell/phone within reach;with bed alarm set  OT Visit Diagnosis: Muscle weakness (generalized) (M62.81);Dizziness and giddiness (R42)                Time: 1440-1457 OT Time Calculation (min): 17 min Charges:  OT General Charges $OT Visit: 1 Visit OT Evaluation $OT Eval Moderate Complexity: 1 Mod OT Treatments $Therapeutic Activity: 8-22 mins    Cristen Bredeson, OTS 05/19/2022, 4:25 PM

## 2022-05-19 NOTE — Assessment & Plan Note (Addendum)
Creatinine 2.23 on presentation.  Last creatinine 1.44 which is her baseline.

## 2022-05-19 NOTE — Assessment & Plan Note (Addendum)
Right lobar pneumonia.  Continue Rocephin and completed Zithromax.  Sepsis ruled out.

## 2022-05-19 NOTE — Assessment & Plan Note (Addendum)
Received fluids during the hospital course.  Discontinue fluids

## 2022-05-19 NOTE — Assessment & Plan Note (Addendum)
Low-dose Norvasc.

## 2022-05-19 NOTE — Assessment & Plan Note (Addendum)
Oral Protonix.

## 2022-05-19 NOTE — Assessment & Plan Note (Addendum)
ER physician documented an increase use of accessory muscles to breathe and increased work of breathing.  Patient currently on her baseline 2 L of oxygen.

## 2022-05-20 DIAGNOSIS — J181 Lobar pneumonia, unspecified organism: Secondary | ICD-10-CM | POA: Diagnosis not present

## 2022-05-20 DIAGNOSIS — N179 Acute kidney failure, unspecified: Secondary | ICD-10-CM | POA: Diagnosis not present

## 2022-05-20 DIAGNOSIS — R531 Weakness: Secondary | ICD-10-CM

## 2022-05-20 DIAGNOSIS — E440 Moderate protein-calorie malnutrition: Secondary | ICD-10-CM

## 2022-05-20 DIAGNOSIS — J9621 Acute and chronic respiratory failure with hypoxia: Secondary | ICD-10-CM | POA: Diagnosis not present

## 2022-05-20 DIAGNOSIS — J441 Chronic obstructive pulmonary disease with (acute) exacerbation: Secondary | ICD-10-CM | POA: Diagnosis not present

## 2022-05-20 LAB — CBC
HCT: 28.8 % — ABNORMAL LOW (ref 36.0–46.0)
Hemoglobin: 9.4 g/dL — ABNORMAL LOW (ref 12.0–15.0)
MCH: 31.5 pg (ref 26.0–34.0)
MCHC: 32.6 g/dL (ref 30.0–36.0)
MCV: 96.6 fL (ref 80.0–100.0)
Platelets: 260 10*3/uL (ref 150–400)
RBC: 2.98 MIL/uL — ABNORMAL LOW (ref 3.87–5.11)
RDW: 14.8 % (ref 11.5–15.5)
WBC: 11 10*3/uL — ABNORMAL HIGH (ref 4.0–10.5)
nRBC: 0 % (ref 0.0–0.2)

## 2022-05-20 LAB — BASIC METABOLIC PANEL
Anion gap: 10 (ref 5–15)
BUN: 43 mg/dL — ABNORMAL HIGH (ref 8–23)
CO2: 22 mmol/L (ref 22–32)
Calcium: 9.3 mg/dL (ref 8.9–10.3)
Chloride: 114 mmol/L — ABNORMAL HIGH (ref 98–111)
Creatinine, Ser: 1.43 mg/dL — ABNORMAL HIGH (ref 0.44–1.00)
GFR, Estimated: 36 mL/min — ABNORMAL LOW (ref >60)
Glucose, Bld: 128 mg/dL — ABNORMAL HIGH (ref 70–99)
Potassium: 3.9 mmol/L (ref 3.5–5.1)
Sodium: 146 mmol/L — ABNORMAL HIGH (ref 135–145)

## 2022-05-20 LAB — IRON AND TIBC
Iron: 26 ug/dL — ABNORMAL LOW (ref 28–170)
Saturation Ratios: 12 % (ref 10.4–31.8)
TIBC: 213 ug/dL — ABNORMAL LOW (ref 250–450)
UIBC: 187 ug/dL

## 2022-05-20 LAB — FERRITIN: Ferritin: 90 ng/mL (ref 11–307)

## 2022-05-20 MED ORDER — BOOST / RESOURCE BREEZE PO LIQD CUSTOM
1.0000 | Freq: Three times a day (TID) | ORAL | Status: DC
  Administered 2022-05-20 – 2022-05-26 (×13): 1 via ORAL

## 2022-05-20 MED ORDER — IPRATROPIUM-ALBUTEROL 0.5-2.5 (3) MG/3ML IN SOLN
3.0000 mL | Freq: Two times a day (BID) | RESPIRATORY_TRACT | Status: DC
  Administered 2022-05-21 – 2022-05-25 (×8): 3 mL via RESPIRATORY_TRACT
  Filled 2022-05-20 (×10): qty 3

## 2022-05-20 NOTE — Assessment & Plan Note (Signed)
Continue supplements

## 2022-05-20 NOTE — Progress Notes (Signed)
Occupational Therapy Treatment Patient Details Name: Stephanie [name] MRN: [id number] DOB: Feb 07, 1936 Today's Date: 05/20/2022   History of present illness Stephanie [name] is an 86 y.o. female seen in the emergency room with complaints of shortness of breath with occasional cough runny nose and nasal congestion. Patient went to her primary care was given Ceftin and was told to that she probably has pneumonia. Today patient is wheezing on her oxygen level was trying to have decrease was dropping low while ambulating.   OT comments  Chart reviewed, pt greeted in bed, very tearful throughout, oriented to self only. Poor safety awareness. Pt reports "I am at home". Tx session targeted improving activity tolerance for improved participation in ADL tasks. STS completed 5x with RW, pt down to 88% on 3 L via [place], visibly SOB, up to 93% with rest at edge of bed. Grooming completed with SET UP. Unable to further progress amb/mobility due to current cognitive status. OT will continue to follow acutely.    Recommendations for follow up therapy are one component of a multi-disciplinary discharge planning process, led by the attending physician.  Recommendations may be updated based on patient status, additional functional criteria and insurance authorization.    Follow Up Recommendations  Skilled nursing-short term rehab (<3 hours/day)    Assistance Recommended at Discharge Frequent or constant Supervision/Assistance  Patient can return home with the following  A little help with walking and/or transfers;A little help with bathing/dressing/bathroom;Help with stairs or ramp for entrance;Assist for transportation;Assistance with cooking/housework   Equipment Recommendations  Other (comment) (defer)    Recommendations for Other Services      Precautions / Restrictions Precautions Precautions: Fall Restrictions Weight Bearing Restrictions: No       Mobility Bed Mobility Overal bed  mobility: Needs Assistance Bed Mobility: Supine to Sit, Sit to Supine     Supine to sit: Min guard, HOB elevated Sit to supine: Min guard, HOB elevated        Transfers Overall transfer level: Needs assistance Equipment used: Rolling walker (2 wheels) Transfers: Sit to/from Stand Sit to Stand: Supervision, Min guard           General transfer comment: Pt performs STS 5x with supervision-CGA, increased WOB, reports SOB . Spo2 88% after mobility.     Balance Overall balance assessment: Needs assistance Sitting-balance support: Feet supported Sitting balance-Leahy Scale: Good     Standing balance support: Bilateral upper extremity supported Standing balance-Leahy Scale: Fair                             ADL either performed or assessed with clinical judgement   ADL Overall ADL's : Needs assistance/impaired                     Lower Body Dressing: Maximal assistance                      Extremity/Trunk Assessment              Vision       Perception     Praxis      Cognition Arousal/Alertness: Awake/alert Behavior During Therapy: WFL for tasks assessed/performed Overall Cognitive Status: Impaired/Different from baseline Area of Impairment: Orientation, Memory, Following commands, Safety/judgement, Awareness, Problem solving                 Orientation Level: Disoriented to, Time, Situation, Place("I need to go watch  the grand kids")     Following Commands: Follows one step commands with increased time Safety/Judgement: Decreased awareness of safety, Decreased awareness of deficits Awareness: Emergent Problem Solving: Slow processing, Requires verbal cues, Requires tactile cues, Difficulty sequencing          Exercises      Shoulder Instructions       General Comments patient has difficulty with pursed lip breathing technique even with visual cues. Sp02 92% after transfer from bed side commode to chair and  heart rate in the high 120's with activity.    Pertinent Vitals/ Pain       Pain Assessment Pain Assessment: No/denies pain  Home Living                                          Prior Functioning/Environment              Frequency  Min 2X/week        Progress Toward Goals  OT Goals(current goals can now be found in the care plan section)  Progress towards OT goals: Progressing toward goals     Plan Discharge plan remains appropriate    Co-evaluation                 AM-PAC OT "6 Clicks" Daily Activity     Outcome Measure   Help from another person eating meals?: None Help from another person taking care of personal grooming?: None Help from another person toileting, which includes using toliet, bedpan, or urinal?: A Little Help from another person bathing (including washing, rinsing, drying)?: A Little Help from another person to put on and taking off regular upper body clothing?: None Help from another person to put on and taking off regular lower body clothing?: A Lot 6 Click Score: 20    End of Session Equipment Utilized During Treatment: Rolling walker (2 wheels)  OT Visit Diagnosis: Muscle weakness (generalized) (M62.81);Dizziness and giddiness (R42)   Activity Tolerance Patient limited by fatigue   Patient Left in bed;with call bell/phone within reach;with bed alarm set   Nurse Communication Mobility status        Time: 2111-7356 OT Time Calculation (min): 15 min  Charges: OT General Charges $OT Visit: 1 Visit OT Treatments $Therapeutic Activity: 8-22 mins Shanon Payor, OTD OTR/L  05/20/22, 3:50 PM

## 2022-05-20 NOTE — Assessment & Plan Note (Signed)
Physical therapy recommending rehab

## 2022-05-20 NOTE — Progress Notes (Signed)
Initial Nutrition Assessment  DOCUMENTATION CODES:   Non-severe (moderate) malnutrition in context of chronic illness  INTERVENTION:   -Boost Breeze po TID, each supplement provides 250 kcal and 9 grams of protein  -MVI with minerals daily  NUTRITION DIAGNOSIS:   Moderate Malnutrition related to chronic illness (COPD) as evidenced by mild fat depletion, moderate fat depletion, mild muscle depletion, moderate muscle depletion.  GOAL:   Patient will meet greater than or equal to 90% of their needs  MONITOR:   PO intake, Supplement acceptance  REASON FOR ASSESSMENT:   Consult Assessment of nutrition requirement/status  ASSESSMENT:   Pt admitted with complaints of shortness of breath with occasional cough runny nose and nasal congestion.  Pt admitted with COPD exacerbation.   Reviewed I/O's: -1.9 L x 24 hours and -1 L since admission  UOP: 2 L x 24 hours   Spoke with pt and daughter at bedside. Daughter reports pt has experienced a general decline in health over the past 1-2 months, when she was last hospitalized for UTI and dehydration. Pt lives alone in an apartment and family members rotate bringing her meals at dinner time. Pt usually will eat the meal provided, but grazes throughout the day on peanut butter, peanut butter crackers, pretzels, and yogurt. Pt ate very little at breakfast this morning. Noted meal completions 50%.   Per daughter, pt UBW is around 120# and estimates she has lost about 8 pounds over the past 1-2 months.   Discussed importance of good meal and supplement intake to promote healing. Pt feels more weak and fell about a week ago in her home; she has transitioned back to using a walker. Pt has tried Boost and Ensure supplements in the past, but does not like them. She is amenable to [employer].   Medications reviewed and include calcium-vitamin D and solu-medrol.   Labs reviewed: Na: 146, CBGS: 118.   NUTRITION - FOCUSED PHYSICAL  EXAM:  Flowsheet Row Most Recent Value  Orbital Region Mild depletion  Upper Arm Region Moderate depletion  Thoracic and Lumbar Region No depletion  Buccal Region Mild depletion  Temple Region Moderate depletion  Clavicle Bone Region Moderate depletion  Clavicle and Acromion Bone Region Moderate depletion  Scapular Bone Region Moderate depletion  Dorsal Hand Mild depletion  Patellar Region Mild depletion  Anterior Thigh Region Mild depletion  Posterior Calf Region Mild depletion  Edema (RD Assessment) None  Hair Reviewed  Eyes Reviewed  Mouth Reviewed  Skin Reviewed  Nails Reviewed       Diet Order:   Diet Order             Diet regular Room service appropriate? Yes; Fluid consistency: Thin  Diet effective now                   EDUCATION NEEDS:   Education needs have been addressed  Skin:  Skin Assessment: Reviewed RN Assessment  Last BM:  05/17/22  Height:   Ht Readings from Last 1 Encounters:  05/18/22 5\' 2"  (1.575 m)    Weight:   Wt Readings from Last 1 Encounters:  05/18/22 53.1 kg    Ideal Body Weight:  50 kg  BMI:  Body mass index is 21.4 kg/m.  Estimated Nutritional Needs:   Kcal:  1550-1750  Protein:  80-95 gram  Fluid:  > 1.5 L    Loistine Chance, RD, LDN, [place] Registered Dietitian II Certified Diabetes Care and Education Specialist Please refer to [facility] for RD and/or  RD on-call/weekend/after hours pager

## 2022-05-20 NOTE — Progress Notes (Signed)
Physical Therapy Treatment Patient Details Name: Stephanie [name] MRN: [id number] DOB: July 22, 1936 Today's Date: 05/20/2022   History of Present Illness Stephanie [name] is an 86 y.o. female seen in the emergency room with complaints of shortness of breath with occasional cough runny nose and nasal congestion. Patient went to her primary care was given Ceftin and was told to that she probably has pneumonia. Today patient is wheezing on her oxygen level was trying to have decrease was dropping low while ambulating.    PT Comments    Patient is agreeable to PT. She states she wants to try to get a bath today. She continues to have shortness of breath with minimal activity. She needs assistance for transfer to and from bed side commode. Unable to stand long enough to progress walking due to poor endurance, fatigue, and shortness of breath with minimal activity. She also has difficulty with pursed lip breathing technique despite visual and verbal cues for technique. Sp02 92% and heart rate in the high 120's immediately after activity. Recommend to continue PT to maximize independence and facilitate return to prior level of function. Continue to recommend SNF.    Recommendations for follow up therapy are one component of a multi-disciplinary discharge planning process, led by the attending physician.  Recommendations may be updated based on patient status, additional functional criteria and insurance authorization.  Follow Up Recommendations  Skilled nursing-short term rehab (<3 hours/day) Can patient physically be transported by private vehicle: No   Assistance Recommended at Discharge Frequent or constant Supervision/Assistance  Patient can return home with the following A little help with walking and/or transfers;A little help with bathing/dressing/bathroom;Assistance with cooking/housework;Assist for transportation;Help with stairs or ramp for entrance   Equipment Recommendations   (to  be determined at next venue of care)    Recommendations for Other Services       Precautions / Restrictions Precautions Precautions: Fall Restrictions Weight Bearing Restrictions: No     Mobility  Bed Mobility Overal bed mobility: Needs Assistance Bed Mobility: Supine to Sit, Sit to Supine     Supine to sit: Min guard, HOB elevated Sit to supine: Min assist, HOB elevated   General bed mobility comments: assistance for LE support to return to bed. increased time and effort required    Transfers Overall transfer level: Needs assistance Equipment used: None Transfers: Bed to chair/wheelchair/BSC     Step pivot transfers: Min assist       General transfer comment: patient performed transfer to and from bed side commode. after having a bowel movement, patient was able to complete peri-care with set-up but required moderate assistance for lower body dressing to donn underware. she is fatigued and short odf breath with minimal activity and required extended rest breaks between bouts of activity    Ambulation/Gait               General Gait Details: not attempted due to shortness of breath with minimal activity   Stairs             Wheelchair Mobility    Modified Rankin (Stroke Patients Only)       Balance Overall balance assessment: Needs assistance Sitting-balance support: Feet supported Sitting balance-Leahy Scale: Fair     Standing balance support: Single extremity supported Standing balance-Leahy Scale: Fair Standing balance comment: flexed posture, standing tolerance is limited, low endurance overall  Cognition Arousal/Alertness: Awake/alert Behavior During Therapy: WFL for tasks assessed/performed Overall Cognitive Status: Within Functional Limits for tasks assessed                                 General Comments: tangential speech at times. patient is difficult to understand also  intermittently        Exercises      General Comments General comments (skin integrity, edema, etc.): patient has difficulty with pursed lip breathing technique even with visual cues. Sp02 92% after transfer from bed side commode to chair and heart rate in the high 120's with activity.      Pertinent Vitals/Pain Pain Assessment Pain Assessment: No/denies pain    Home Living                          Prior Function            PT Goals (current goals can now be found in the care plan section) Acute Rehab PT Goals Patient Stated Goal: to go home PT Goal Formulation: With patient Time For Goal Achievement: 06/02/22 Potential to Achieve Goals: Fair Progress towards PT goals: Progressing toward goals    Frequency    Min 2X/week      PT Plan Current plan remains appropriate    Co-evaluation              AM-PAC PT "6 Clicks" Mobility   Outcome Measure  Help needed turning from your back to your side while in a flat bed without using bedrails?: None Help needed moving from lying on your back to sitting on the side of a flat bed without using bedrails?: A Little Help needed moving to and from a bed to a chair (including a wheelchair)?: A Lot Help needed standing up from a chair using your arms (e.g., wheelchair or bedside chair)?: A Little Help needed to walk in hospital room?: A Lot Help needed climbing 3-5 steps with a railing? : A Lot 6 Click Score: 16    End of Session Equipment Utilized During Treatment: Oxygen Activity Tolerance: Patient limited by fatigue Patient left: in bed;with call bell/phone within reach;with bed alarm set   PT Visit Diagnosis: Other abnormalities of gait and mobility (R26.89);Muscle weakness (generalized) (M62.81);Difficulty in walking, not elsewhere classified (R26.2);History of falling (Z91.81)     Time: 4332-9518 PT Time Calculation (min) (ACUTE ONLY): 24 min  Charges:  $Therapeutic Activity: 23-37 mins                      Minna Merritts, PT, MPT    Percell Locus 05/20/2022, 2:55 PM

## 2022-05-20 NOTE — Progress Notes (Signed)
  Progress Note   Patient: Stephanie [name] PZW:[id number] DOB: May 08, 1936 DOA: 05/18/2022     1 DOS: the patient was seen and examined on 05/20/2022    Assessment and Plan: * Acute on chronic respiratory failure with hypoxia [facility]) ER physician documented an increase use of accessory muscles to breathe and increased work of breathing.  Patient currently on her baseline 2 L of oxygen.  Acute exacerbation of chronic obstructive pulmonary disease (COPD) (HCC) Continue 40 mg of Solu-Medrol daily.  Continue nebulizer treatments and inhalers.  Lobar pneumonia ([place]) Right lobar pneumonia.  Continue Rocephin and Zithromax.  Sepsis ruled out.  Acute renal failure superimposed on stage 3b chronic kidney disease (HCC) Creatinine 2.23 on presentation.  Baseline creatinine 1.43 today.  Discontinue IV fluids.  Encourage eating and drinking.    Lactic acidosis Received fluids during the hospital course.  Discontinue fluids  Anemia Last hemoglobin 9.4.  Check iron studies added on to morning labs    Gastroduodenal ulcer Oral Protonix.   Essential hypertension Continue Norvasc   DVT (deep venous thrombosis) (HCC) Continue Eliquis  Generalized weakness Physical therapy recommending rehab  Malnutrition of moderate degree Continue supplements        Subjective: Patient feeling little bit better than yesterday.  Chronically reduced 2 L of oxygen at home.  Still with some shortness of breath and cough.  Admitted with COPD exacerbation and pneumonia  Physical Exam: Vitals:   05/20/22 0757 05/20/22 1236 05/20/22 1323 05/20/22 1519  BP: (!) 152/81 (!) 143/73    Pulse: (!) 104 100    Resp:      Temp: (!) 97.5 F (36.4 C) 98.2 F (36.8 C)    TempSrc:      SpO2: 97% 96% 94% 94%  Weight:      Height:       Physical Exam HENT:     Head: Normocephalic.     Mouth/Throat:     Pharynx: No oropharyngeal exudate.  Eyes:     General: Lids are normal.     Conjunctiva/sclera:  Conjunctivae normal.  Cardiovascular:     Rate and Rhythm: Normal rate and regular rhythm.     Heart sounds: Normal heart sounds, S1 normal and S2 normal.  Pulmonary:     Breath sounds: Examination of the right-lower field reveals decreased breath sounds and rhonchi. Examination of the left-lower field reveals decreased breath sounds and rhonchi. Decreased breath sounds, wheezing and rhonchi present. No rales.  Abdominal:     Palpations: Abdomen is soft.     Tenderness: There is no abdominal tenderness.  Musculoskeletal:     Right lower leg: No swelling.     Left lower leg: No swelling.  Neurological:     Mental Status: She is alert.     Data Reviewed: White blood cell count 11.0, hemoglobin 9.4 platelet count 260, creatinine 1.43, sodium 146  Family Communication: Spoke with patient's daughter at the bedside  Disposition: Status is: Inpatient Remains inpatient appropriate because: Still with wheeze and shortness of breath.  Planned Discharge Destination: Rehab    Time spent: 27 minutes  Author: Loletha Grayer, MD 05/20/2022 4:11 PM  For on call review [email or web address].

## 2022-05-20 NOTE — NC FL2 (Addendum)
[place] LEVEL OF CARE SCREENING TOOL     IDENTIFICATION  Patient Name: Stephanie [name] Birthdate: 28-Nov-1935 Sex: female Admission Date (Current Location): 05/18/2022  [facility] and [place] Number:  Engineering geologist and Address:  [facility], [address], [place], [place] [address]      Provider Number: [id number]  Attending Physician Name and Address:  Loletha Grayer, MD  Relative Name and Phone Number:  Nance Pew [phone]    Current Level of Care: Hospital Recommended Level of Care: [place] Prior Approval Number:    Date Approved/Denied:   PASRR Number: [id number]  Discharge Plan: SNF    Current Diagnoses: Patient Active Problem List   Diagnosis Date Noted   Malnutrition of moderate degree 05/20/2022   Pneumonia 05/19/2022   Lactic acidosis 05/19/2022   Acute renal failure superimposed on stage 3b chronic kidney disease ([place]) 05/18/2022   Anemia 05/18/2022   Lobar pneumonia ([place]) 03/14/2022   Acute on chronic respiratory failure with hypoxia ([place]) 03/14/2022   CKD (chronic kidney disease) stage 3, GFR 30-59 ml/min ([place]) 03/14/2022   CAP (community acquired pneumonia) 09/15/2017   History of stroke 09/06/2017   Essential hypertension 04/14/2017   PFO (patent foramen ovale)    DVT (deep venous thrombosis) (HCC)    Cerebrovascular accident (CVA) due to embolism of left middle cerebral artery ([place])    Elevated troponin 08/18/2016   Mild malnutrition ([place]) 08/18/2016   Acute CVA (cerebrovascular accident) ([place]) 08/17/2016   Acute exacerbation of chronic obstructive pulmonary disease (COPD) ([place]) 07/19/2016   Depression 05/03/2016   Mild dementia ([place]) 04/19/2016   Anxiety and depression 03/04/2015   Arteriosclerosis of coronary artery 03/04/2015   CAFL (chronic airflow limitation) (HCC) 03/04/2015   Breathlessness on exertion 03/04/2015   HLD (hyperlipidemia) 03/04/2015   Osteopenia  03/04/2015   Peptic ulcer 03/04/2015   Amnesia 03/20/2014   Disordered sleep 03/20/2014   Allergy to environmental factors 11/14/2013   Gastroduodenal ulcer 11/14/2013    Orientation RESPIRATION BLADDER Height & Weight     Self, Time, Situation, Place  O2 (Nasal Cannula 3 L) Continent Weight: 117 lb (53.1 kg) Height:  5\' 2"  (157.5 cm)  BEHAVIORAL SYMPTOMS/MOOD NEUROLOGICAL BOWEL NUTRITION STATUS   (None)  (None) Continent Diet (Regular)  AMBULATORY STATUS COMMUNICATION OF NEEDS Skin   Extensive Assist Verbally Bruising                       Personal Care Assistance Level of Assistance  Bathing, Feeding, Dressing Bathing Assistance: Limited assistance Feeding assistance: Limited assistance Dressing Assistance: Limited assistance     Functional Limitations Info  Sight, Hearing, Speech Sight Info: Adequate Hearing Info: Adequate Speech Info: Adequate    SPECIAL CARE FACTORS FREQUENCY  PT (By licensed PT), OT (By licensed OT)     PT Frequency: 5 x week OT Frequency: 5 x week            Contractures Contractures Info: Not present    Additional Factors Info  Code Status, Allergies Code Status Info: DNR Allergies Info: Duloxetine Hcl, Influenza Vaccines, Vanlafaxine           Current Medications (05/20/2022):  This is the current hospital active medication list Current Facility-Administered Medications  Medication Dose Route Frequency Provider Last Rate Last Admin   albuterol (PROVENTIL) (2.5 MG/3ML) 0.083% nebulizer solution 2.5 mg  2.5 mg Nebulization Q2H PRN Para Skeans, MD   2.5 mg at  05/19/22 2029   amLODipine (NORVASC) tablet 5 mg  5 mg Oral Daily Loletha Grayer, MD   5 mg at 05/20/22 0902   apixaban (ELIQUIS) tablet 2.5 mg  2.5 mg Oral BID Loletha Grayer, MD   2.5 mg at 05/20/22 0902   azithromycin (ZITHROMAX) tablet 500 mg  500 mg Oral Daily Para Skeans, MD   500 mg at 05/20/22 2774   calcium-vitamin D (OSCAL WITH D) 500-5 MG-MCG per tablet 1  tablet  1 tablet Oral Daily Loletha Grayer, MD   1 tablet at 05/20/22 1287   cefTRIAXone (ROCEPHIN) 2 g in sodium chloride 0.9 % 100 mL IVPB  2 g Intravenous Q24H Loletha Grayer, MD 200 mL/hr at 05/19/22 2011 2 g at 05/19/22 2011   feeding supplement (BOOST / RESOURCE BREEZE) liquid 1 Container  1 Container Oral TID BM Loletha Grayer, MD   1 Container at 05/20/22 1305   fluticasone furoate-vilanterol (BREO ELLIPTA) 100-25 MCG/ACT 1 puff  1 puff Inhalation Daily Loletha Grayer, MD   1 puff at 05/20/22 0902   And   umeclidinium bromide (INCRUSE ELLIPTA) 62.5 MCG/ACT 1 puff  1 puff Inhalation Daily Loletha Grayer, MD   1 puff at 05/20/22 0902   gabapentin (NEURONTIN) capsule 100 mg  100 mg Oral BID Loletha Grayer, MD   100 mg at 05/20/22 0903   guaiFENesin (MUCINEX) 12 hr tablet 600 mg  600 mg Oral BID PRN Para Skeans, MD   600 mg at 05/19/22 2136   hydrALAZINE (APRESOLINE) injection 5 mg  5 mg Intravenous Q4H PRN Para Skeans, MD       ipratropium-albuterol (DUONEB) 0.5-2.5 (3) MG/3ML nebulizer solution 3 mL  3 mL Nebulization Q6H Florina Ou V, MD   3 mL at 05/20/22 1323   loratadine (CLARITIN) tablet 10 mg  10 mg Oral Daily Loletha Grayer, MD   10 mg at 05/20/22 8676   methylPREDNISolone sodium succinate (SOLU-MEDROL) 40 mg/mL injection 40 mg  40 mg Intravenous Daily Loletha Grayer, MD   40 mg at 05/20/22 0522   montelukast (SINGULAIR) tablet 10 mg  10 mg Oral QHS Loletha Grayer, MD   10 mg at 05/19/22 2136   multivitamin with minerals tablet 1 tablet  1 tablet Oral Daily Loletha Grayer, MD   1 tablet at 05/20/22 0903   pantoprazole (PROTONIX) EC tablet 40 mg  40 mg Oral Daily Loletha Grayer, MD   40 mg at 05/20/22 0903   sodium chloride flush (NS) 0.9 % injection 3 mL  3 mL Intravenous Q12H Para Skeans, MD   3 mL at 05/20/22 7209     Discharge Medications: Please see discharge summary for a list of discharge medications.  Relevant Imaging Results:  Relevant Lab  Results:   Additional Information SS#: [id number]  Candie Chroman, LCSW

## 2022-05-21 DIAGNOSIS — J9621 Acute and chronic respiratory failure with hypoxia: Secondary | ICD-10-CM | POA: Diagnosis not present

## 2022-05-21 DIAGNOSIS — J181 Lobar pneumonia, unspecified organism: Secondary | ICD-10-CM | POA: Diagnosis not present

## 2022-05-21 DIAGNOSIS — N179 Acute kidney failure, unspecified: Secondary | ICD-10-CM | POA: Diagnosis not present

## 2022-05-21 DIAGNOSIS — J441 Chronic obstructive pulmonary disease with (acute) exacerbation: Secondary | ICD-10-CM | POA: Diagnosis not present

## 2022-05-21 DIAGNOSIS — R531 Weakness: Secondary | ICD-10-CM

## 2022-05-21 DIAGNOSIS — D509 Iron deficiency anemia, unspecified: Secondary | ICD-10-CM

## 2022-05-21 LAB — BASIC METABOLIC PANEL WITH GFR
Anion gap: 7 (ref 5–15)
BUN: 47 mg/dL — ABNORMAL HIGH (ref 8–23)
CO2: 23 mmol/L (ref 22–32)
Calcium: 9.1 mg/dL (ref 8.9–10.3)
Chloride: 115 mmol/L — ABNORMAL HIGH (ref 98–111)
Creatinine, Ser: 1.44 mg/dL — ABNORMAL HIGH (ref 0.44–1.00)
GFR, Estimated: 36 mL/min — ABNORMAL LOW
Glucose, Bld: 109 mg/dL — ABNORMAL HIGH (ref 70–99)
Potassium: 4 mmol/L (ref 3.5–5.1)
Sodium: 145 mmol/L (ref 135–145)

## 2022-05-21 LAB — HEMOGLOBIN: Hemoglobin: 9.3 g/dL — ABNORMAL LOW (ref 12.0–15.0)

## 2022-05-21 MED ORDER — PREDNISONE 20 MG PO TABS
40.0000 mg | ORAL_TABLET | Freq: Every day | ORAL | Status: DC
  Administered 2022-05-22 – 2022-05-23 (×2): 40 mg via ORAL
  Filled 2022-05-21 (×2): qty 2

## 2022-05-21 MED ORDER — FERROUS SULFATE 325 (65 FE) MG PO TABS
325.0000 mg | ORAL_TABLET | Freq: Every day | ORAL | Status: DC
  Administered 2022-05-22 – 2022-05-26 (×5): 325 mg via ORAL
  Filled 2022-05-21 (×5): qty 1

## 2022-05-21 NOTE — Assessment & Plan Note (Signed)
Last hemoglobin 9.3.  Ferritin 90.  Start iron.

## 2022-05-21 NOTE — Plan of Care (Signed)
  Problem: Education: Goal: Knowledge of disease or condition will improve Outcome: Progressing Goal: Knowledge of the prescribed therapeutic regimen will improve Outcome: Progressing Goal: Individualized Educational Video(s) Outcome: Progressing   Problem: Activity: Goal: Ability to tolerate increased activity will improve Outcome: Progressing Goal: Will verbalize the importance of balancing activity with adequate rest periods Outcome: Progressing   Problem: Respiratory: Goal: Ability to maintain a clear airway will improve Outcome: Progressing Goal: Levels of oxygenation will improve Outcome: Progressing Goal: Ability to maintain adequate ventilation will improve Outcome: Progressing   Problem: Education: Goal: Knowledge of General Education information will improve Description: Including pain rating scale, medication(s)/side effects and non-pharmacologic comfort measures Outcome: Progressing   Problem: Health Behavior/Discharge Planning: Goal: Ability to manage health-related needs will improve Outcome: Progressing   Problem: Clinical Measurements: Goal: Ability to maintain clinical measurements within normal limits will improve Outcome: Progressing Goal: Will remain free from infection Outcome: Progressing Goal: Diagnostic test results will improve Outcome: Progressing Goal: Respiratory complications will improve Outcome: Progressing Goal: Cardiovascular complication will be avoided Outcome: Progressing   Problem: Activity: Goal: Risk for activity intolerance will decrease Outcome: Progressing   Problem: Nutrition: Goal: Adequate nutrition will be maintained Outcome: Progressing   Problem: Coping: Goal: Level of anxiety will decrease Outcome: Progressing   Problem: Elimination: Goal: Will not experience complications related to bowel motility Outcome: Progressing Goal: Will not experience complications related to urinary retention Outcome: Progressing    Problem: Pain Managment: Goal: General experience of comfort will improve Outcome: Progressing   Problem: Safety: Goal: Ability to remain free from injury will improve Outcome: Progressing

## 2022-05-21 NOTE — Progress Notes (Signed)
  Progress Note   Patient: Stephanie [name] [id number] DOB: 1936/02/05 DOA: 05/18/2022     2 DOS: the patient was seen and examined on 05/21/2022   Assessment and Plan: * Acute on chronic respiratory failure with hypoxia [facility]) ER physician documented an increase use of accessory muscles to breathe and increased work of breathing.  Patient currently on her baseline 2 L of oxygen.  Acute exacerbation of chronic obstructive pulmonary disease (COPD) (HCC) Continue 40 mg of Solu-Medrol today and switch to p.o. prednisone for tomorrow.  Continue nebulizer treatments and inhalers.  Lobar pneumonia ([place]) Right lobar pneumonia.  Continue Rocephin and Zithromax.  Sepsis ruled out.  Acute renal failure superimposed on stage 3b chronic kidney disease (HCC) Creatinine 2.23 on presentation.  Creatinine 1.44 which is her baseline.  Lactic acidosis Received fluids during the hospital course.  Discontinue fluids  Gastroduodenal ulcer Oral Protonix.   Essential hypertension Continue Norvasc   DVT (deep venous thrombosis) (HCC) Continue Eliquis  Iron deficiency anemia Last hemoglobin 9.3.  Ferritin 90.  Start iron.  Generalized weakness Physical therapy recommending rehab  Malnutrition of moderate degree Continue supplements        Subjective: Patient feeling a little bit better.  Patient's daughter at the bedside states she looks a little bit better than when she came in.  Patient still has a little cough as well as shortness of breath.  Physical Exam: Vitals:   05/20/22 2120 05/21/22 0615 05/21/22 0814 05/21/22 0816  BP: 133/72 (!) 165/78 136/69   Pulse: (!) 110 (!) 102 (!) 110   Resp: 18 18 20    Temp: 97.6 F (36.4 C) 98 F (36.7 C) 97.7 F (36.5 C)   TempSrc:   Oral   SpO2: 98% 97% 96% 95%  Weight:      Height:       Physical Exam HENT:     Head: Normocephalic.     Mouth/Throat:     Pharynx: No oropharyngeal exudate.  Eyes:     General: Lids are normal.      Conjunctiva/sclera: Conjunctivae normal.  Cardiovascular:     Rate and Rhythm: Normal rate and regular rhythm.     Heart sounds: Normal heart sounds, S1 normal and S2 normal.  Pulmonary:     Breath sounds: Examination of the right-lower field reveals decreased breath sounds. Examination of the left-lower field reveals decreased breath sounds. Decreased breath sounds present. No wheezing, rhonchi or rales.  Abdominal:     Palpations: Abdomen is soft.     Tenderness: There is no abdominal tenderness.  Musculoskeletal:     Right lower leg: No swelling.     Left lower leg: No swelling.  Neurological:     Mental Status: She is alert.     Data Reviewed: Sodium 145, creatinine 1.44, ferritin 90, hemoglobin 9.3  Family Communication: Spoke with patient's daughter at the bedside  Disposition: Status is: Inpatient Remains inpatient appropriate because: Will need rehab.  IV Solu-Medrol today and will convert over to p.o. prednisone for tomorrow.  Planned Discharge Destination: Rehab    Time spent: 28 minutes  Author: Loletha Grayer, MD 05/21/2022 3:43 PM  For on call review [email or web address].

## 2022-05-21 NOTE — TOC CM/SW Note (Signed)
Attempted call to daughter to discuss SNF. Left VM requesting return call.  Oleh Genin, [place]

## 2022-05-22 DIAGNOSIS — J9621 Acute and chronic respiratory failure with hypoxia: Secondary | ICD-10-CM | POA: Diagnosis not present

## 2022-05-22 DIAGNOSIS — J441 Chronic obstructive pulmonary disease with (acute) exacerbation: Secondary | ICD-10-CM | POA: Diagnosis not present

## 2022-05-22 DIAGNOSIS — J181 Lobar pneumonia, unspecified organism: Secondary | ICD-10-CM | POA: Diagnosis not present

## 2022-05-22 DIAGNOSIS — N179 Acute kidney failure, unspecified: Secondary | ICD-10-CM | POA: Diagnosis not present

## 2022-05-22 NOTE — TOC Initial Note (Addendum)
Transition of Care [facility]) - Initial/Assessment Note    Patient Details  Name: Stephanie [name] MRN: [id number] Date of Birth: 1936/03/27  Transition of Care [facility]) CM/SW Contact:    Magnus Ivan, LCSW Phone Number: 05/22/2022, 11:57 AM  Clinical Narrative:                 CSW reached out to daughter Butch Penny via phone as patient is not fully oriented (per chart).  Patient lives alone and her children check on her daily. Patient had [place] prior to admission through [facility]. Patient has a RW, cane, O2 ([employer]), and a shower stool at home. Children provide transportation. PCP is Dr. Caryl Comes.  Butch Penny stated they are agreeable to SNF and have already spoken to [facility] who has agreed to take patient. CSW called and spoke with Magda Paganini at [facility], [place] confirmed they can accept patient but will not have a bed until Tuesday. Updated Butch Penny who is agreeable. Updated MD. CSW called [employer] to start insurance auth. Unable to reach anyone by phone, had to leave VM requesting a return call.   1:45- Called [employer] again, spoke to [place], started insurance auth for [employer] and [employer]. Provided weekday CSW Courtney's number for [facility] to follow up Monday with outcome.   3:20- Call from [place] with [facility] who stated if Josem Kaufmann comes back tomorrow 10/16 then they can now take her Monday. TOC handoff updated. MD updated.     Expected Discharge Plan: Skilled Nursing Facility Barriers to Discharge: Continued Medical Work up   Patient Goals and CMS Choice Patient states their goals for this hospitalization and ongoing recovery are:: SNF CMS Medicare.gov Compare Post Acute Care list provided to:: Patient Represenative (must comment) Choice offered to / list presented to : Adult Children  Expected Discharge Plan and Services Expected Discharge Plan: [place]       Living arrangements for the past 2 months: Single Family Home                                       Prior Living Arrangements/Services Living arrangements for the past 2 months: Single Family Home Lives with:: Self Patient language and need for interpreter reviewed:: Yes Do you feel safe going back to the place where you live?: Yes      Need for Family Participation in Patient Care: Yes (Comment) Care giver support system in place?: Yes (comment) Current home services: DME, Home PT Criminal Activity/Legal Involvement Pertinent to Current Situation/Hospitalization: No - Comment as needed  Activities of Daily Living Home Assistive Devices/Equipment: Oxygen ADL Screening (condition at time of admission) Patient's cognitive ability adequate to safely complete daily activities?: Yes Is the patient deaf or have difficulty hearing?: No Does the patient have difficulty seeing, even when wearing glasses/contacts?: No Does the patient have difficulty concentrating, remembering, or making decisions?: No Patient able to express need for assistance with ADLs?: Yes Does the patient have difficulty dressing or bathing?: No Independently performs ADLs?: Yes (appropriate for developmental age) Does the patient have difficulty walking or climbing stairs?: No Weakness of Legs: Both Weakness of Arms/Hands: None  Permission Sought/Granted Permission sought to share information with : Facility Art therapist granted to share information with : Yes, Verbal Permission Granted (by daughter Butch Penny)     Permission granted to share info w AGENCY: [facility]  Emotional Assessment         Alcohol / Substance Use: Not Applicable Psych Involvement: No (comment)  Admission diagnosis:  Pneumonia [J18.9] COPD exacerbation ([place]) [J44.1] Respiratory failure with hypoxia ([place]) [J96.91] AKI (acute kidney injury) ([place]) [N17.9] Patient Active Problem List   Diagnosis Date Noted   Iron deficiency anemia 05/21/2022   Malnutrition of moderate  degree 05/20/2022   Generalized weakness 05/20/2022   Pneumonia 05/19/2022   Lactic acidosis 05/19/2022   Acute renal failure superimposed on stage 3b chronic kidney disease ([place]) 05/18/2022   Lobar pneumonia ([place]) 03/14/2022   Acute on chronic respiratory failure with hypoxia ([place]) 03/14/2022   CKD (chronic kidney disease) stage 3, GFR 30-59 ml/min (HCC) 03/14/2022   CAP (community acquired pneumonia) 09/15/2017   History of stroke 09/06/2017   Essential hypertension 04/14/2017   PFO (patent foramen ovale)    DVT (deep venous thrombosis) (HCC)    Cerebrovascular accident (CVA) due to embolism of left middle cerebral artery (HCC)    Elevated troponin 08/18/2016   Mild malnutrition ([place]) 08/18/2016   Acute CVA (cerebrovascular accident) ([place]) 08/17/2016   Acute exacerbation of chronic obstructive pulmonary disease (COPD) ([place]) 07/19/2016   Depression 05/03/2016   Mild dementia ([place]) 04/19/2016   Anxiety and depression 03/04/2015   Arteriosclerosis of coronary artery 03/04/2015   CAFL (chronic airflow limitation) ([place]) 03/04/2015   Breathlessness on exertion 03/04/2015   HLD (hyperlipidemia) 03/04/2015   Osteopenia 03/04/2015   Peptic ulcer 03/04/2015   Amnesia 03/20/2014   Disordered sleep 03/20/2014   Allergy to environmental factors 11/14/2013   Gastroduodenal ulcer 11/14/2013   PCP:  Adin Hector, MD Pharmacy:   [id number] - [place], [place] - [address] [place] [address] Phone: [phone] Fax: [phone]     Social Determinants of Health (SDOH) Interventions    Readmission Risk Interventions     No data to display

## 2022-05-22 NOTE — Progress Notes (Signed)
  Progress Note   Patient: Stephanie [name] XBD:[id number] DOB: Jan 08, 1936 DOA: 05/18/2022     3 DOS: the patient was seen and examined on 05/22/2022     Assessment and Plan: * Acute on chronic respiratory failure with hypoxia [facility]) ER physician documented an increase use of accessory muscles to breathe and increased work of breathing.  Patient currently on her baseline 2 L of oxygen.  Acute exacerbation of chronic obstructive pulmonary disease (COPD) (HCC) Switch to p.o. prednisone this morning.  Continue nebulizer treatments and inhalers.  Lobar pneumonia ([place]) Right lobar pneumonia.  Continue Rocephin and completed Zithromax.  Sepsis ruled out.  Acute renal failure superimposed on stage 3b chronic kidney disease (HCC) Creatinine 2.23 on presentation.  Last creatinine 1.44 which is her baseline.  Lactic acidosis Received fluids during the hospital course.  Discontinue fluids  Gastroduodenal ulcer Oral Protonix.   Essential hypertension Continue Norvasc   DVT (deep venous thrombosis) (HCC) Continue Eliquis  Iron deficiency anemia Last hemoglobin 9.3.  Ferritin 90.  Start iron.  Generalized weakness Physical therapy recommending rehab  Malnutrition of moderate degree Continue supplements        Subjective: Patient feeling better.  She states she slept well last night.  Still has a little cough.  Breathing is better.  Admitted with COPD exacerbation and pneumonia.  Physical Exam: Vitals:   05/22/22 0333 05/22/22 0739 05/22/22 0834 05/22/22 1614  BP: (!) 148/70  (!) 142/71 129/68  Pulse: (!) 103  93 86  Resp: 18  18 18   Temp: 97.7 F (36.5 C)  98.1 F (36.7 C) 97.9 F (36.6 C)  TempSrc:   Oral   SpO2: 97% 95% 93% 98%  Weight:      Height:       Physical Exam HENT:     Head: Normocephalic.     Mouth/Throat:     Pharynx: No oropharyngeal exudate.  Eyes:     General: Lids are normal.     Conjunctiva/sclera: Conjunctivae normal.  Cardiovascular:      Rate and Rhythm: Normal rate and regular rhythm.     Heart sounds: Normal heart sounds, S1 normal and S2 normal.  Pulmonary:     Breath sounds: Examination of the right-lower field reveals decreased breath sounds. Examination of the left-lower field reveals decreased breath sounds. Decreased breath sounds present. No wheezing, rhonchi or rales.  Abdominal:     Palpations: Abdomen is soft.     Tenderness: There is no abdominal tenderness.  Musculoskeletal:     Right lower leg: No swelling.     Left lower leg: No swelling.  Neurological:     Mental Status: She is alert.     Data Reviewed: Creatinine 1.44  Family Communication: Spoke with patient's son at the bedside  Disposition: Status is: Inpatient Remains inpatient appropriate because: Awaiting insurance authorization to go out to rehab.  Planned Discharge Destination: Rehab    Time spent: 27 minutes  Author: Loletha Grayer, MD 05/22/2022 4:37 PM  For on call review [email or web address].

## 2022-05-23 DIAGNOSIS — J9621 Acute and chronic respiratory failure with hypoxia: Secondary | ICD-10-CM | POA: Diagnosis not present

## 2022-05-23 DIAGNOSIS — J441 Chronic obstructive pulmonary disease with (acute) exacerbation: Secondary | ICD-10-CM | POA: Diagnosis not present

## 2022-05-23 DIAGNOSIS — N179 Acute kidney failure, unspecified: Secondary | ICD-10-CM | POA: Diagnosis not present

## 2022-05-23 DIAGNOSIS — J181 Lobar pneumonia, unspecified organism: Secondary | ICD-10-CM | POA: Diagnosis not present

## 2022-05-23 LAB — CULTURE, BLOOD (ROUTINE X 2)
Culture: NO GROWTH
Culture: NO GROWTH
Special Requests: ADEQUATE
Special Requests: ADEQUATE

## 2022-05-23 LAB — CBC
HCT: 32.3 % — ABNORMAL LOW (ref 36.0–46.0)
Hemoglobin: 10.1 g/dL — ABNORMAL LOW (ref 12.0–15.0)
MCH: 30.1 pg (ref 26.0–34.0)
MCHC: 31.3 g/dL (ref 30.0–36.0)
MCV: 96.1 fL (ref 80.0–100.0)
Platelets: 273 10*3/uL (ref 150–400)
RBC: 3.36 MIL/uL — ABNORMAL LOW (ref 3.87–5.11)
RDW: 14.5 % (ref 11.5–15.5)
WBC: 9.5 10*3/uL (ref 4.0–10.5)
nRBC: 0 % (ref 0.0–0.2)

## 2022-05-23 LAB — BASIC METABOLIC PANEL
Anion gap: 7 (ref 5–15)
BUN: 39 mg/dL — ABNORMAL HIGH (ref 8–23)
CO2: 25 mmol/L (ref 22–32)
Calcium: 9 mg/dL (ref 8.9–10.3)
Chloride: 115 mmol/L — ABNORMAL HIGH (ref 98–111)
Creatinine, Ser: 1.3 mg/dL — ABNORMAL HIGH (ref 0.44–1.00)
GFR, Estimated: 41 mL/min — ABNORMAL LOW (ref >60)
Glucose, Bld: 96 mg/dL (ref 70–99)
Potassium: 3.9 mmol/L (ref 3.5–5.1)
Sodium: 147 mmol/L — ABNORMAL HIGH (ref 135–145)

## 2022-05-23 MED ORDER — POLYETHYLENE GLYCOL 3350 17 G PO PACK: 17.0000 g | PACK | Freq: Every day | ORAL | Status: DC

## 2022-05-23 MED ORDER — POLYETHYLENE GLYCOL 3350 17 G PO PACK: 17.0000 g | PACK | Freq: Every day | ORAL | Status: DC | PRN

## 2022-05-23 MED ORDER — SERTRALINE HCL 50 MG PO TABS
100.0000 mg | ORAL_TABLET | Freq: Every day | ORAL | Status: DC
  Administered 2022-05-23 – 2022-05-26 (×4): 100 mg via ORAL
  Filled 2022-05-23 (×4): qty 2

## 2022-05-23 NOTE — Progress Notes (Signed)
Occupational Therapy Treatment Patient Details Name: Stephanie [name] MRN: [id number] DOB: [birth year] Today's Date: 05/23/2022   History of present illness Stephanie [name] is an 86 y.o. female seen in the emergency room with complaints of shortness of breath with occasional cough runny nose and nasal congestion. Patient went to her primary care was given Ceftin and was told to that she probably has pneumonia. Today patient is wheezing on her oxygen level was trying to have decrease was dropping low while ambulating.   OT comments  Patient in bed upon arrival and agreeable to OT treatment session. Patient was able to perform bed mobility with min guard and HOB raised. Patient was also able to transfer sit <> stand with supervision using RW  to recliner. Required VC for hand placement on RW. Educated on the proper use incentive spirometer. Patient was able to verbalize and demonstrate demonstration. Patient left in recliner with chair alarm set, call bell in reach, and all needs met.    Recommendations for follow up therapy are one component of a multi-disciplinary discharge planning process, led by the attending physician.  Recommendations may be updated based on patient status, additional functional criteria and insurance authorization.    Follow Up Recommendations  Skilled nursing-short term rehab (<3 hours/day)    Assistance Recommended at Discharge Intermittent Supervision/Assistance  Patient can return home with the following  A little help with walking and/or transfers;A little help with bathing/dressing/bathroom;Help with stairs or ramp for entrance;Assist for transportation;Assistance with cooking/housework   Equipment Recommendations  Other (comment) (Defer to next venue of care)       Precautions / Restrictions Precautions Precautions: Fall Restrictions Weight Bearing Restrictions: No       Mobility Bed Mobility Overal bed mobility: Needs Assistance Bed Mobility:  Supine to Sit     Supine to sit: Min guard, HOB elevated          Transfers Overall transfer level: Needs assistance Equipment used: Rolling walker (2 wheels) Transfers: Sit to/from Stand Sit to Stand: Supervision, Min guard           General transfer comment: VC for hand placement on RW     Balance Overall balance assessment: Needs assistance Sitting-balance support: Feet supported Sitting balance-Leahy Scale: Good     Standing balance support: Bilateral upper extremity supported Standing balance-Leahy Scale: Fair                             ADL either performed or assessed with clinical judgement    Extremity/Trunk Assessment Upper Extremity Assessment Upper Extremity Assessment: Generalized weakness   Lower Extremity Assessment Lower Extremity Assessment: Generalized weakness   Cervical / Trunk Assessment Cervical / Trunk Assessment: Normal     Cognition Arousal/Alertness: Awake/alert Behavior During Therapy: WFL for tasks assessed/performed Overall Cognitive Status: Impaired/Different from baseline                                                     Pertinent Vitals/ Pain       Pain Assessment Pain Assessment: No/denies pain   Frequency  Min 2X/week        Progress Toward Goals  OT Goals(current goals can now be found in the care plan section)  Progress towards OT goals: Progressing toward goals  Acute Rehab OT Goals  Patient Stated Goal: to return home. OT Goal Formulation: With patient Time For Goal Achievement: 06/02/22 Potential to Achieve Goals: Good  Plan Discharge plan remains appropriate       AM-PAC OT "6 Clicks" Daily Activity     Outcome Measure   Help from another person eating meals?: None Help from another person taking care of personal grooming?: None Help from another person toileting, which includes using toliet, bedpan, or urinal?: A Little Help from another person bathing  (including washing, rinsing, drying)?: A Little Help from another person to put on and taking off regular upper body clothing?: None Help from another person to put on and taking off regular lower body clothing?: A Little 6 Click Score: 21    End of Session Equipment Utilized During Treatment: Rolling walker (2 wheels)  OT Visit Diagnosis: Muscle weakness (generalized) (M62.81);Dizziness and giddiness (R42)   Activity Tolerance Patient tolerated treatment well   Patient Left in chair;with call bell/phone within reach;with chair alarm set   Nurse Communication Mobility status        Time: 4235-3614 OT Time Calculation (min): 10 min  Charges:      Tomasa Blase, OTS 05/23/2022, 3:57 PM

## 2022-05-23 NOTE — Care Management Important Message (Signed)
Important Message  Patient Details  Name: Stephanie [name] MRN: [id number] Date of Birth: 1936-03-28   Medicare Important Message Given:  Yes     Juliann Pulse A Samarion Ehle 05/23/2022, 12:40 PM

## 2022-05-23 NOTE — Progress Notes (Signed)
  Progress Note   Patient: Stephanie [name] DJS:[id number] DOB: Jan 13, 1936 DOA: 05/18/2022     4 DOS: the patient was seen and examined on 05/23/2022   Assessment and Plan: * Acute on chronic respiratory failure with hypoxia [facility]) ER physician documented an increase use of accessory muscles to breathe and increased work of breathing.  Patient currently on her baseline 2 L of oxygen.  Acute exacerbation of chronic obstructive pulmonary disease (COPD) (HCC) 5 days of steroids completed.  Continue nebulizer treatments and inhalers.  Lobar pneumonia ([place]) Right lobar pneumonia.  Completed 5 days of Rocephin and Zithromax.  Sepsis ruled out.  Acute renal failure superimposed on stage 3b chronic kidney disease (HCC) Creatinine 2.23 on presentation and improved to 1.30 with GFR 41.  Lactic acidosis Received fluids during the hospital course.  Discontinue fluids  Gastroduodenal ulcer Oral Protonix.   Essential hypertension Blood pressure little on the lower side this morning and Norvasc was held.  DVT (deep venous thrombosis) (HCC) Continue Eliquis  Iron deficiency anemia Last hemoglobin 10.1.  Ferritin 90.  Start iron.  Generalized weakness Physical therapy recommending rehab  Malnutrition of moderate degree Continue supplements        Subjective: Patient feels okay.  Had some constipation left Had a bowel movement today.  Breathing better than when she came in.  Awaiting rehab.  Physical Exam: Vitals:   05/22/22 2038 05/23/22 0443 05/23/22 0757 05/23/22 0900  BP: (!) 150/72 (!) 172/85 126/60 113/71  Pulse: 98 (!) 102 97 94  Resp: 16 17 16    Temp: 98.1 F (36.7 C) 98 F (36.7 C) 98.7 F (37.1 C)   TempSrc: Oral Oral    SpO2: 98% 95% 94%   Weight:      Height:       Physical Exam HENT:     Head: Normocephalic.     Mouth/Throat:     Pharynx: No oropharyngeal exudate.  Eyes:     General: Lids are normal.     Conjunctiva/sclera: Conjunctivae normal.   Cardiovascular:     Rate and Rhythm: Normal rate and regular rhythm.     Heart sounds: Normal heart sounds, S1 normal and S2 normal.  Pulmonary:     Breath sounds: Examination of the right-lower field reveals decreased breath sounds. Examination of the left-lower field reveals decreased breath sounds. Decreased breath sounds present. No wheezing, rhonchi or rales.  Abdominal:     Palpations: Abdomen is soft.     Tenderness: There is no abdominal tenderness.  Musculoskeletal:     Right lower leg: No swelling.     Left lower leg: No swelling.  Neurological:     Mental Status: She is alert.     Data Reviewed: Hemoglobin 10.1, white blood cell count 9.5, creatinine 1.3, sodium 147  Family Communication: Spoke with family at the bedside  Disposition: Status is: Inpatient Remains inpatient appropriate because: Medically stable for rehab today but insurance did not authorize yet.  Planned Discharge Destination: Rehab    Time spent: 27 minutes  Author: Loletha Grayer, MD 05/23/2022 3:16 PM  For on call review [email or web address].

## 2022-05-23 NOTE — TOC Progression Note (Signed)
Transition of Care [facility]) - Progression Note    Patient Details  Name: Stephanie [name] MRN: [id number] Date of Birth: Mar 24, 1936  Transition of Care [facility]) CM/SW [place], [place] Phone Number: 05/23/2022, 2:33 PM  Clinical Narrative:     SW spoke to [place] at [facility] regarding authorization. She received the updated physical therapy note. [employer] is still working on authorization and the case has been sent to their medical director for review. The case has been expedited. They will call SW back tomorrow with an update. SW updated attending at [facility] regarding authorization.  Expected Discharge Plan: [place] Barriers to Discharge: Continued Medical Work up  Expected Discharge Plan and Services Expected Discharge Plan: [place] arrangements for the past 2 months: Single Family Home                                       Social Determinants of Health (SDOH) Interventions    Readmission Risk Interventions     No data to display

## 2022-05-23 NOTE — Progress Notes (Signed)
Physical Therapy Treatment Patient Details Name: Stephanie [name] MRN: [id number] DOB: 17-Apr-1936 Today's Date: 05/23/2022   History of Present Illness Stephanie [name] is an 86 y.o. female seen in the emergency room with complaints of shortness of breath with occasional cough runny nose and nasal congestion. Patient went to her primary care was given Ceftin and was told to that she probably has pneumonia. Today patient is wheezing on her oxygen level was trying to have decrease was dropping low while ambulating.    PT Comments    Patient agreeable to PT treatment with supportive family in the room. The patient is making progress with increased activity tolerance this session. However she continues to require physical assistance with mobility. She ambulated 2 bouts of 71ft with up to minimal assistance for steadying. Cues for breathing techniques and tips for energy conservation. The patient is unsafe to return home alone and will need SNF for short term rehab at discharge.    Recommendations for follow up therapy are one component of a multi-disciplinary discharge planning process, led by the attending physician.  Recommendations may be updated based on patient status, additional functional criteria and insurance authorization.  Follow Up Recommendations  Skilled nursing-short term rehab (<3 hours/day) Can patient physically be transported by private vehicle: Yes   Assistance Recommended at Discharge Frequent or constant Supervision/Assistance  Patient can return home with the following A little help with walking and/or transfers;A little help with bathing/dressing/bathroom;Assistance with cooking/housework;Assist for transportation;Help with stairs or ramp for entrance   Equipment Recommendations  None recommended by PT    Recommendations for Other Services       Precautions / Restrictions Precautions Precautions: Fall Precaution Comments: monitor Sp02 Restrictions Weight  Bearing Restrictions: No     Mobility  Bed Mobility Overal bed mobility: Needs Assistance Bed Mobility: Supine to Sit, Sit to Supine     Supine to sit: Min guard, HOB elevated Sit to supine: Min guard, HOB elevated   General bed mobility comments: verbal cues for task initiaion    Transfers Overall transfer level: Needs assistance Equipment used: Rolling walker (2 wheels) Transfers: Sit to/from Stand Sit to Stand: Min assist           General transfer comment: lifting and steadying assistance provided with transfers. sit to stand performed x 3 bouts. verbal cues for hand placement for safety    Ambulation/Gait Ambulation/Gait assistance: Min guard, Min assist Gait Distance (Feet): 30 Feet (performed x 2 bouts) Assistive device: Rolling walker (2 wheels) Gait Pattern/deviations: Step-through pattern, Narrow base of support Gait velocity: decreased     General Gait Details: patient walked 2 bouts of 30 ft with occasional steadying assistance provided, otherwise Ming guard for safety. verbal cues for breathing techniques and tips for energy conservation. Sp02 91% after walking on 2 L02. short rest break required between botus of walking   Stairs             Wheelchair Mobility    Modified Rankin (Stroke Patients Only)       Balance                                            Cognition Arousal/Alertness: Awake/alert Behavior During Therapy: WFL for tasks assessed/performed Overall Cognitive Status: Impaired/Different from baseline  General Comments: occasionally tangential. able to follow signle step commands consistently        Exercises      General Comments        Pertinent Vitals/Pain Pain Assessment Pain Assessment: No/denies pain    Home Living                          Prior Function            PT Goals (current goals can now be found in the care plan  section) Acute Rehab PT Goals Patient Stated Goal: to go home PT Goal Formulation: With patient Time For Goal Achievement: 06/02/22 Potential to Achieve Goals: Fair Progress towards PT goals: Progressing toward goals    Frequency    Min 2X/week      PT Plan Current plan remains appropriate    Co-evaluation              AM-PAC PT "6 Clicks" Mobility   Outcome Measure  Help needed turning from your back to your side while in a flat bed without using bedrails?: A Little Help needed moving from lying on your back to sitting on the side of a flat bed without using bedrails?: A Little Help needed moving to and from a bed to a chair (including a wheelchair)?: A Little Help needed standing up from a chair using your arms (e.g., wheelchair or bedside chair)?: A Little Help needed to walk in hospital room?: A Little Help needed climbing 3-5 steps with a railing? : Total 6 Click Score: 16    End of Session Equipment Utilized During Treatment: Oxygen Activity Tolerance: Patient tolerated treatment well Patient left: in bed;with call bell/phone within reach;with family/visitor present (supportive daughter in the room)   PT Visit Diagnosis: Other abnormalities of gait and mobility (R26.89);Muscle weakness (generalized) (M62.81);Difficulty in walking, not elsewhere classified (R26.2);History of falling (Z91.81)     Time: 2500-3704 PT Time Calculation (min) (ACUTE ONLY): 29 min  Charges:  $Therapeutic Activity: 23-37 mins                     Minna Merritts, PT, MPT   Percell Locus 05/23/2022, 10:49 AM

## 2022-05-24 ENCOUNTER — Inpatient Hospital Stay: Payer: PPO

## 2022-05-24 DIAGNOSIS — J9621 Acute and chronic respiratory failure with hypoxia: Secondary | ICD-10-CM | POA: Diagnosis not present

## 2022-05-24 DIAGNOSIS — N179 Acute kidney failure, unspecified: Secondary | ICD-10-CM | POA: Diagnosis not present

## 2022-05-24 DIAGNOSIS — I69320 Aphasia following cerebral infarction: Secondary | ICD-10-CM | POA: Diagnosis not present

## 2022-05-24 DIAGNOSIS — J181 Lobar pneumonia, unspecified organism: Secondary | ICD-10-CM | POA: Diagnosis not present

## 2022-05-24 DIAGNOSIS — J449 Chronic obstructive pulmonary disease, unspecified: Secondary | ICD-10-CM | POA: Diagnosis not present

## 2022-05-24 DIAGNOSIS — Z9981 Dependence on supplemental oxygen: Secondary | ICD-10-CM | POA: Diagnosis not present

## 2022-05-24 DIAGNOSIS — M1380 Other specified arthritis, unspecified site: Secondary | ICD-10-CM | POA: Diagnosis not present

## 2022-05-24 DIAGNOSIS — N130 Hydronephrosis with ureteropelvic junction obstruction: Secondary | ICD-10-CM | POA: Diagnosis not present

## 2022-05-24 DIAGNOSIS — I129 Hypertensive chronic kidney disease with stage 1 through stage 4 chronic kidney disease, or unspecified chronic kidney disease: Secondary | ICD-10-CM | POA: Diagnosis not present

## 2022-05-24 DIAGNOSIS — I2510 Atherosclerotic heart disease of native coronary artery without angina pectoris: Secondary | ICD-10-CM | POA: Diagnosis not present

## 2022-05-24 DIAGNOSIS — J441 Chronic obstructive pulmonary disease with (acute) exacerbation: Secondary | ICD-10-CM | POA: Diagnosis not present

## 2022-05-24 DIAGNOSIS — R1310 Dysphagia, unspecified: Secondary | ICD-10-CM | POA: Diagnosis not present

## 2022-05-24 MED ORDER — METHYLPREDNISOLONE SODIUM SUCC 40 MG IJ SOLR
40.0000 mg | Freq: Every day | INTRAMUSCULAR | Status: DC
  Administered 2022-05-24 – 2022-05-25 (×2): 40 mg via INTRAVENOUS
  Filled 2022-05-24 (×2): qty 1

## 2022-05-24 MED ORDER — GUAIFENESIN ER 600 MG PO TB12
600.0000 mg | ORAL_TABLET | Freq: Two times a day (BID) | ORAL | Status: DC
  Administered 2022-05-24 – 2022-05-26 (×4): 600 mg via ORAL
  Filled 2022-05-24 (×4): qty 1

## 2022-05-24 MED ORDER — AMLODIPINE BESYLATE 5 MG PO TABS
2.5000 mg | ORAL_TABLET | Freq: Every day | ORAL | Status: DC
  Administered 2022-05-24 – 2022-05-25 (×2): 2.5 mg via ORAL
  Filled 2022-05-24 (×2): qty 1

## 2022-05-24 NOTE — Hospital Course (Signed)
86 year old female with end-stage COPD on 2 L of oxygen, COPD, arthritis, CAD, depression, GI bleed, hypertension.  She presented to the hospital with shortness of breath and increasing oxygen requirements.  Was found to have lobar pneumonia and COPD exacerbation.  Started on steroids and antibiotics.  Completed antibiotic course here in the hospital.  Insurance authorization happened on 05/24/2022 but on evaluation had a lot of upper airway congestion and transmitted breath sounds.  Steroids were restarted.  Since [employer] does not do nebulizers and we will watch overnight and make sure stable prior to deciding on disposition.

## 2022-05-24 NOTE — Progress Notes (Signed)
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  She presented to the hospital with shortness of breath and increasing oxygen requirements.  Was found to have lobar pneumonia and COPD exacerbation.  Started on steroids and antibiotics.  Completed antibiotic course here in the hospital.  Insurance authorization happened on 05/24/2022 but on evaluation had a lot of upper airway congestion and transmitted breath sounds.  Steroids were restarted.  Since [employer] does not do nebulizers and we will watch overnight and make sure stable prior to deciding on disposition.  Assessment and Plan: * Acute on chronic respiratory failure with hypoxia [facility]) ER physician documented an increase use of accessory muscles to breathe and increased work of breathing.  Patient currently on her baseline 2 L of oxygen.  Acute exacerbation of chronic obstructive pulmonary disease (COPD) ([place]) Continue nebulizer treatments and inhalers.  Restarted Solu-Medrol today with a lot of upper airway congestion and upper airway wheeze.  Lobar pneumonia ([place]) Right lobar pneumonia.  Completed 5 days of Rocephin and Zithromax.  Sepsis ruled out.  Acute renal failure superimposed on stage 3b chronic kidney disease (HCC) Creatinine 2.23 on presentation and improved to 1.30 with GFR 41.  Lactic acidosis Received fluids during the hospital course.  Discontinue fluids  Gastroduodenal ulcer Oral Protonix.   Essential hypertension Low-dose Norvasc.  DVT (deep venous thrombosis) (HCC) Continue Eliquis  Iron deficiency anemia Last hemoglobin 10.1.  Ferritin 90.  Start iron.  Generalized weakness Physical therapy recommending rehab  Malnutrition of moderate degree Continue  supplements        Subjective: Patient seen after breathing treatment today.  She had a lot of upper airway congestion and transmitted wheeze.  She had a little bit more cough.  I evaluated her 2 more times today throughout the day to get a little bit better but still quite a bit of transmitted upper airway wheeze.  Physical Exam: Vitals:   05/23/22 2119 05/24/22 0530 05/24/22 0755 05/24/22 0759  BP: (!) 160/73 (!) 152/76 (!) 149/74   Pulse: 93 90 95 96  Resp: 16 18 20 20   Temp: 97.6 F (36.4 C) (!) 97.5 F (36.4 C) 97.8 F (36.6 C)   TempSrc:  Oral Oral   SpO2: 99% 99% 97% 96%  Weight:      Height:       Physical Exam HENT:     Head: Normocephalic.     Mouth/Throat:     Pharynx: No oropharyngeal exudate.  Eyes:     General: Lids are normal.     Conjunctiva/sclera: Conjunctivae normal.  Cardiovascular:     Rate and Rhythm: Normal rate and regular rhythm.     Heart sounds: Normal heart sounds, S1 normal and S2 normal.  Pulmonary:     Breath sounds: Transmitted upper airway sounds present. Examination of the right-middle field reveals wheezing. Examination of the left-middle field reveals wheezing. Examination of the right-lower field reveals decreased breath sounds and wheezing. Examination of the left-lower field reveals decreased breath sounds and wheezing. Decreased breath sounds and wheezing present. No rhonchi or rales.     Comments: Upper airway congestion and transmitted upper airway wheeze Abdominal:     Palpations: Abdomen is soft.     Tenderness: There is no abdominal tenderness.  Musculoskeletal:     Right lower leg: No swelling.     Left lower leg: No swelling.  Neurological:     Mental Status: She is alert.     Data Reviewed: Repeat chest x-ray shows interval improvement on the right basilardisease with residual reticulonodular opacities.  No new or worsening airspace disease.  Family Communication: Spoke with patient's daughter 3  times  Disposition: Status is: Inpatient Remains inpatient appropriate because: With new upper Airway congestion and with [employer] not being able to do nebulizers I will watch overnight.  Planned Discharge Destination: Rehab    Time spent: 35 minutes  Author: Loletha Grayer, MD 05/24/2022 4:38 PM  For on call review [email or web address].

## 2022-05-24 NOTE — TOC Progression Note (Addendum)
Transition of Care [facility]) - Progression Note    Patient Details  Name: Stephanie [name] MRN: [id number] Date of Birth: 1936-01-23  Transition of Care [facility]) CM/SW [place], [place] Phone Number: 05/24/2022, 9:50 AM  Clinical Narrative:     SW spoke to [facility] and the patient has been approved for 7 days of SNF. Authorization number is [id number]. Insurance has denied ambulance transport. Patient will need car transport to the SNF.  1040: As per MD, the patient may need to discharge later this afternoon or tomorrow.   Expected Discharge Plan: [place] Barriers to Discharge: Continued Medical Work up  Expected Discharge Plan and Services Expected Discharge Plan: [place] arrangements for the past 2 months: Single Family Home                                       Social Determinants of Health (SDOH) Interventions    Readmission Risk Interventions     No data to display

## 2022-05-24 NOTE — NC FL2 (Signed)
[place] LEVEL OF CARE SCREENING TOOL     IDENTIFICATION  Patient Name: Stephanie [name] Birthdate: 09-Jan-1936 Sex: female Admission Date (Current Location): 05/18/2022  [facility] and [place] Number:  Engineering geologist and Address:  [facility], [address] [place], [place] [address]      Provider Number: [id number]  Attending Physician Name and Address:  Loletha Grayer, MD  Relative Name and Phone Number:  Nance Pew [phone]    Current Level of Care: Hospital Recommended Level of Care: [place] Prior Approval Number:    Date Approved/Denied:   PASRR Number: [id number]  Discharge Plan: SNF    Current Diagnoses: Patient Active Problem List   Diagnosis Date Noted   Iron deficiency anemia 05/21/2022   Malnutrition of moderate degree 05/20/2022   Generalized weakness 05/20/2022   Pneumonia 05/19/2022   Lactic acidosis 05/19/2022   Acute renal failure superimposed on stage 3b chronic kidney disease ([place]) 05/18/2022   Lobar pneumonia ([place]) 03/14/2022   Acute on chronic respiratory failure with hypoxia ([place]) 03/14/2022   CKD (chronic kidney disease) stage 3, GFR 30-59 ml/min ([place]) 03/14/2022   CAP (community acquired pneumonia) 09/15/2017   History of stroke 09/06/2017   Essential hypertension 04/14/2017   PFO (patent foramen ovale)    DVT (deep venous thrombosis) ([place])    Cerebrovascular accident (CVA) due to embolism of left middle cerebral artery ([place])    Elevated troponin 08/18/2016   Mild malnutrition ([place]) 08/18/2016   Acute CVA (cerebrovascular accident) ([place]) 08/17/2016   Acute exacerbation of chronic obstructive pulmonary disease (COPD) ([place]) 07/19/2016   Depression 05/03/2016   Mild dementia ([place]) 04/19/2016   Anxiety and depression 03/04/2015   Arteriosclerosis of coronary artery 03/04/2015   CAFL (chronic airflow limitation) (HCC) 03/04/2015   Breathlessness on exertion  03/04/2015   HLD (hyperlipidemia) 03/04/2015   Osteopenia 03/04/2015   Peptic ulcer 03/04/2015   Amnesia 03/20/2014   Disordered sleep 03/20/2014   Allergy to environmental factors 11/14/2013   Gastroduodenal ulcer 11/14/2013    Orientation RESPIRATION BLADDER Height & Weight     Self, Place  O2 (2 liters) Continent Weight: 117 lb (53.1 kg) Height:  5\' 2"  (157.5 cm)  BEHAVIORAL SYMPTOMS/MOOD NEUROLOGICAL BOWEL NUTRITION STATUS  Other (Comment) (none)  (memory impairment) Continent Diet (Regular)  AMBULATORY STATUS COMMUNICATION OF NEEDS Skin   Limited Assist Verbally Bruising                       Personal Care Assistance Level of Assistance  Bathing, Feeding, Dressing Bathing Assistance: Limited assistance Feeding assistance: Limited assistance Dressing Assistance: Limited assistance     Functional Limitations Info  Speech, Hearing, Sight Sight Info: Adequate Hearing Info: Adequate Speech Info: Adequate    SPECIAL CARE FACTORS FREQUENCY  PT (By licensed PT), OT (By licensed OT)     PT Frequency: 5x a week OT Frequency: 5x a week            Contractures Contractures Info: Not present    Additional Factors Info  Code Status Code Status Info: DNR Allergies Info: Duloxetine, Influenza Vaccines, Vanlafaxine           Current Medications (05/24/2022):  This is the current hospital active medication list Current Facility-Administered Medications  Medication Dose Route Frequency Provider Last Rate Last Admin   albuterol (PROVENTIL) (2.5 MG/3ML) 0.083% nebulizer solution 2.5 mg  2.5 mg Nebulization Q2H PRN Para Skeans, MD  2.5 mg at 05/19/22 2029   amLODipine (NORVASC) tablet 2.5 mg  2.5 mg Oral Daily Loletha Grayer, MD   2.5 mg at 05/24/22 0815   apixaban (ELIQUIS) tablet 2.5 mg  2.5 mg Oral BID Loletha Grayer, MD   2.5 mg at 05/24/22 0815   calcium-vitamin D (OSCAL WITH D) 500-5 MG-MCG per tablet 1 tablet  1 tablet Oral Daily Loletha Grayer, MD    1 tablet at 05/24/22 0815   feeding supplement (BOOST / RESOURCE BREEZE) liquid 1 Container  1 Container Oral TID BM Loletha Grayer, MD   1 Container at 05/24/22 5885   ferrous sulfate tablet 325 mg  325 mg Oral Q breakfast Loletha Grayer, MD   325 mg at 05/24/22 0821   fluticasone furoate-vilanterol (BREO ELLIPTA) 100-25 MCG/ACT 1 puff  1 puff Inhalation Daily Loletha Grayer, MD   1 puff at 05/24/22 0816   And   umeclidinium bromide (INCRUSE ELLIPTA) 62.5 MCG/ACT 1 puff  1 puff Inhalation Daily Loletha Grayer, MD   1 puff at 05/24/22 0816   gabapentin (NEURONTIN) capsule 100 mg  100 mg Oral BID Loletha Grayer, MD   100 mg at 05/24/22 0815   guaiFENesin (MUCINEX) 12 hr tablet 600 mg  600 mg Oral BID PRN Para Skeans, MD   600 mg at 05/19/22 2136   hydrALAZINE (APRESOLINE) injection 5 mg  5 mg Intravenous Q4H PRN Para Skeans, MD       ipratropium-albuterol (DUONEB) 0.5-2.5 (3) MG/3ML nebulizer solution 3 mL  3 mL Nebulization BID Loletha Grayer, MD   3 mL at 05/24/22 0759   loratadine (CLARITIN) tablet 10 mg  10 mg Oral Daily Loletha Grayer, MD   10 mg at 05/24/22 0815   methylPREDNISolone sodium succinate (SOLU-MEDROL) 40 mg/mL injection 40 mg  40 mg Intravenous Daily Loletha Grayer, MD   40 mg at 05/24/22 0851   montelukast (SINGULAIR) tablet 10 mg  10 mg Oral QHS Loletha Grayer, MD   10 mg at 05/23/22 2110   multivitamin with minerals tablet 1 tablet  1 tablet Oral Daily Loletha Grayer, MD   1 tablet at 05/24/22 0815   pantoprazole (PROTONIX) EC tablet 40 mg  40 mg Oral Daily Loletha Grayer, MD   40 mg at 05/24/22 0815   polyethylene glycol (MIRALAX / GLYCOLAX) packet 17 g  17 g Oral Daily PRN Loletha Grayer, MD       sertraline (ZOLOFT) tablet 100 mg  100 mg Oral Daily Wieting, Richard, MD   100 mg at 05/24/22 0815   sodium chloride flush (NS) 0.9 % injection 3 mL  3 mL Intravenous Q12H Para Skeans, MD   3 mL at 05/24/22 0816     Discharge Medications: Please see  discharge summary for a list of discharge medications.  Relevant Imaging Results:  Relevant Lab Results:   Additional Information SSN: [id number]  Colen Darling, LCSWA

## 2022-05-25 DIAGNOSIS — J9621 Acute and chronic respiratory failure with hypoxia: Secondary | ICD-10-CM | POA: Diagnosis not present

## 2022-05-25 LAB — CBC
HCT: 30 % — ABNORMAL LOW (ref 36.0–46.0)
Hemoglobin: 9.8 g/dL — ABNORMAL LOW (ref 12.0–15.0)
MCH: 31.3 pg (ref 26.0–34.0)
MCHC: 32.7 g/dL (ref 30.0–36.0)
MCV: 95.8 fL (ref 80.0–100.0)
Platelets: 263 10*3/uL (ref 150–400)
RBC: 3.13 MIL/uL — ABNORMAL LOW (ref 3.87–5.11)
RDW: 14.5 % (ref 11.5–15.5)
WBC: 12.1 10*3/uL — ABNORMAL HIGH (ref 4.0–10.5)
nRBC: 0 % (ref 0.0–0.2)

## 2022-05-25 LAB — BASIC METABOLIC PANEL
Anion gap: 8 (ref 5–15)
BUN: 48 mg/dL — ABNORMAL HIGH (ref 8–23)
CO2: 25 mmol/L (ref 22–32)
Calcium: 9.1 mg/dL (ref 8.9–10.3)
Chloride: 113 mmol/L — ABNORMAL HIGH (ref 98–111)
Creatinine, Ser: 1.19 mg/dL — ABNORMAL HIGH (ref 0.44–1.00)
GFR, Estimated: 45 mL/min — ABNORMAL LOW (ref >60)
Glucose, Bld: 92 mg/dL (ref 70–99)
Potassium: 3.7 mmol/L (ref 3.5–5.1)
Sodium: 146 mmol/L — ABNORMAL HIGH (ref 135–145)

## 2022-05-25 MED ORDER — STERILE WATER FOR INJECTION IJ SOLN
INTRAMUSCULAR | Status: AC
  Filled 2022-05-25: qty 10

## 2022-05-25 MED ORDER — AMLODIPINE BESYLATE 5 MG PO TABS
5.0000 mg | ORAL_TABLET | Freq: Every day | ORAL | Status: DC
  Administered 2022-05-26: 5 mg via ORAL
  Filled 2022-05-25: qty 1

## 2022-05-25 MED ORDER — IPRATROPIUM-ALBUTEROL 0.5-2.5 (3) MG/3ML IN SOLN
3.0000 mL | Freq: Four times a day (QID) | RESPIRATORY_TRACT | Status: DC
  Administered 2022-05-25 – 2022-05-26 (×4): 3 mL via RESPIRATORY_TRACT
  Filled 2022-05-25 (×4): qty 3

## 2022-05-25 MED ORDER — METHYLPREDNISOLONE SODIUM SUCC 40 MG IJ SOLR
40.0000 mg | Freq: Two times a day (BID) | INTRAMUSCULAR | Status: DC
  Administered 2022-05-25 – 2022-05-26 (×2): 40 mg via INTRAVENOUS
  Filled 2022-05-25 (×3): qty 1

## 2022-05-25 NOTE — Progress Notes (Signed)
Physical Therapy Treatment Patient Details Name: Stephanie [name] MRN: [id number] DOB: 03-01-1936 Today's Date: 05/25/2022   History of Present Illness Stephanie [name] is an 86 y.o. female seen in the emergency room with complaints of shortness of breath with occasional cough runny nose and nasal congestion. Patient went to her primary care was given Ceftin and was told to that she probably has pneumonia. Today patient is wheezing on her oxygen level was trying to have decrease was dropping low while ambulating.    PT Comments    Pt received upright in recliner. Agreeable to PT services. Pt progressing in mobility this date only needing supervision for all activity. Pt demonstrates ability to tolerate 60' of gait in room with good stability with RW before needing seated rest. Pt performs 5xSTS test at 14.65 seconds which is WNL for 8th decade of life for LE strength. Although pt progressing and SPO2 remains >/= 90%, pt still with some difficulty breathing with OOB activity. Due to living alone and limited family assistance currently, continue to rec STR at discharge. Pt in recliner with all needs in reach.    Recommendations for follow up therapy are one component of a multi-disciplinary discharge planning process, led by the attending physician.  Recommendations may be updated based on patient status, additional functional criteria and insurance authorization.  Follow Up Recommendations  Skilled nursing-short term rehab (<3 hours/day) Can patient physically be transported by private vehicle: Yes   Assistance Recommended at Discharge Frequent or constant Supervision/Assistance  Patient can return home with the following A little help with walking and/or transfers;A little help with bathing/dressing/bathroom;Assistance with cooking/housework;Assist for transportation;Help with stairs or ramp for entrance   Equipment Recommendations  None recommended by PT    Recommendations for  Other Services       Precautions / Restrictions Precautions Precautions: Fall Precaution Comments: monitor Sp02 Restrictions Weight Bearing Restrictions: No     Mobility  Bed Mobility               General bed mobility comments: NT. In recliner pre and post session. Patient Response: Cooperative  Transfers Overall transfer level: Needs assistance Equipment used: Rolling walker (2 wheels) Transfers: Sit to/from Stand Sit to Stand: Supervision           General transfer comment: VC for hand placement on RW    Ambulation/Gait     Assistive device: Rolling walker (2 wheels) Gait Pattern/deviations: Step-through pattern, Narrow base of support       General Gait Details: improving in tolerance and distance performing 60' without need for seated rest. Safe use of RW only needing supervision level support.   Stairs             Wheelchair Mobility    Modified Rankin (Stroke Patients Only)       Balance Overall balance assessment: Needs assistance Sitting-balance support: Feet supported Sitting balance-Leahy Scale: Good     Standing balance support: Bilateral upper extremity supported Standing balance-Leahy Scale: Fair                              Cognition Arousal/Alertness: Awake/alert Behavior During Therapy: WFL for tasks assessed/performed Overall Cognitive Status: No family/caregiver present to determine baseline cognitive functioning  Exercises General Exercises - Lower Extremity Long Arc Quad: AROM, Strengthening, Both, 10 reps, Supine Hip Flexion/Marching: AROM, Strengthening, Both, 10 reps, Seated Other Exercises Other Exercises: 5x STS test performed at 14.65 sec. Within normal LE strength per age group    General Comments        Pertinent Vitals/Pain Pain Assessment Pain Assessment: No/denies pain    Home Living                           Prior Function            PT Goals (current goals can now be found in the care plan section) Acute Rehab PT Goals Patient Stated Goal: to go home PT Goal Formulation: With patient Time For Goal Achievement: 06/02/22 Potential to Achieve Goals: Fair Progress towards PT goals: Progressing toward goals    Frequency    Min 2X/week      PT Plan Current plan remains appropriate    Co-evaluation              AM-PAC PT "6 Clicks" Mobility   Outcome Measure  Help needed turning from your back to your side while in a flat bed without using bedrails?: A Little Help needed moving from lying on your back to sitting on the side of a flat bed without using bedrails?: A Little Help needed moving to and from a bed to a chair (including a wheelchair)?: A Little Help needed standing up from a chair using your arms (e.g., wheelchair or bedside chair)?: A Little Help needed to walk in hospital room?: A Little Help needed climbing 3-5 steps with a railing? : A Lot 6 Click Score: 17    End of Session Equipment Utilized During Treatment: Oxygen;Gait belt Activity Tolerance: Patient tolerated treatment well Patient left: in chair;with call bell/phone within reach;with chair alarm set Nurse Communication: Mobility status       Time: 5916-3846 PT Time Calculation (min) (ACUTE ONLY): 23 min  Charges:  $Therapeutic Exercise: 23-37 mins                    Tj Kitchings M. Fairly IV, PT, DPT Physical Therapist- [facility]  05/25/2022, 10:55 AM

## 2022-05-25 NOTE — Progress Notes (Signed)
Occupational Therapy Treatment Patient Details Name: Stephanie [name] MRN: [id number] DOB: 02/10/1936 Today's Date: 05/25/2022   History of present illness Stephanie [name] is an 86 y.o. female seen in the emergency room with complaints of shortness of breath with occasional cough runny nose and nasal congestion. Patient went to her primary care was given Ceftin and was told to that she probably has pneumonia. Today patient is wheezing on her oxygen level was trying to have decrease was dropping low while ambulating.   OT comments  Patient sitting EOB upon arrival and agreeable to OT services. Patient was able to perform transfer sit <> stand with supervision and VC for hand placement on walker. Patient ambulated ~20 ft to bathroom to transfer with supervision  to [facility]. Peri-care perform with supervision. While ambulating back to bed patient was able to retrieve items from the floor with min guard. No Lob observed. Patient was educated on energy conservation techniques and proper use of flutter valve. Patient was able to verbalize and demonstrate understanding. Patient left EOB with bed alarm set, call bell in reach, and all needs met.    Recommendations for follow up therapy are one component of a multi-disciplinary discharge planning process, led by the attending physician.  Recommendations may be updated based on patient status, additional functional criteria and insurance authorization.    Follow Up Recommendations  Skilled nursing-short term rehab (<3 hours/day)    Assistance Recommended at Discharge Intermittent Supervision/Assistance  Patient can return home with the following  A little help with walking and/or transfers;A little help with bathing/dressing/bathroom;Help with stairs or ramp for entrance;Assist for transportation;Assistance with cooking/housework   Equipment Recommendations  Other (comment) (Defer to next venue of care.)       Precautions / Restrictions  Precautions Precautions: Fall Restrictions Weight Bearing Restrictions: No       Mobility Bed Mobility               General bed mobility comments: Patient sitting EOB pre and post session.    Transfers     Transfers: Sit to/from Stand Sit to Stand: Supervision                 Balance Overall balance assessment: Needs assistance Sitting-balance support: Feet supported Sitting balance-Leahy Scale: Good     Standing balance support: Bilateral upper extremity supported Standing balance-Leahy Scale: Fair                             ADL either performed or assessed with clinical judgement   ADL                           Toilet Transfer: Supervision/safety   Toileting- Clothing Manipulation and Hygiene: Supervision/safety              Extremity/Trunk Assessment Upper Extremity Assessment Upper Extremity Assessment: Generalized weakness   Lower Extremity Assessment Lower Extremity Assessment: Generalized weakness        Vision Patient Visual Report: No change from baseline            Cognition Arousal/Alertness: Awake/alert Behavior During Therapy: WFL for tasks assessed/performed Overall Cognitive Status: No family/caregiver present to determine baseline cognitive functioning  Pertinent Vitals/ Pain       Pain Assessment Pain Assessment: No/denies pain   Frequency  Min 2X/week        Progress Toward Goals  OT Goals(current goals can now be found in the care plan section)  Progress towards OT goals: Progressing toward goals  Acute Rehab OT Goals Patient Stated Goal: to return home. OT Goal Formulation: With patient Time For Goal Achievement: 06/02/22 Potential to Achieve Goals: Good  Plan Discharge plan remains appropriate       AM-PAC OT "6 Clicks" Daily Activity     Outcome Measure   Help from another person eating meals?:  None Help from another person taking care of personal grooming?: None Help from another person toileting, which includes using toliet, bedpan, or urinal?: A Little Help from another person bathing (including washing, rinsing, drying)?: A Little Help from another person to put on and taking off regular upper body clothing?: None Help from another person to put on and taking off regular lower body clothing?: A Little 6 Click Score: 21    End of Session Equipment Utilized During Treatment: Rolling walker (2 wheels)  OT Visit Diagnosis: Muscle weakness (generalized) (M62.81);Dizziness and giddiness (R42)   Activity Tolerance Patient tolerated treatment well   Patient Left in bed;with call bell/phone within reach;with bed alarm set   Nurse Communication Mobility status        Time: 8786-7672 OT Time Calculation (min): 19 min  Charges:      Tomasa Blase, OTS 05/25/2022, 3:34 PM

## 2022-05-25 NOTE — Progress Notes (Signed)
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  She presented to the hospital with shortness of breath and increasing oxygen requirements.  Was found to have lobar pneumonia and COPD exacerbation.  Started on steroids and antibiotics.  Completed antibiotic course here in the hospital.  Insurance authorization happened on 05/24/2022 but on evaluation had a lot of upper airway congestion and transmitted breath sounds.  10/18: Patient continues to wheeze.  Steroids escalated.  We will anticipate discharge 10/19 if able to optimize respiratory status.   Assessment & Plan:   Principal Problem:   Acute on chronic respiratory failure with hypoxia (HCC) Active Problems:   COPD exacerbation (HCC)   Lobar pneumonia (HCC)   Acute renal failure superimposed on stage 3b chronic kidney disease (HCC)   Gastroduodenal ulcer   Lactic acidosis   Essential hypertension   DVT (deep venous thrombosis) (HCC)   Pneumonia   Malnutrition of moderate degree   Generalized weakness   Iron deficiency anemia  * Acute on chronic respiratory failure with hypoxia [facility]) ER physician documented an increase use of accessory muscles to breathe and increased work of breathing.  Patient currently on her baseline 2 L of oxygen.  Vital per unit protocol   Acute exacerbation of chronic obstructive pulmonary disease (COPD) ([place]) Patient with substantial low wheeze in all lung fields.  Solu-Medrol was restarted 10/17.  Still wheezing substantially.  Will escalate IV steroids to Solu-Medrol 40 mg twice daily.  Continue scheduled and as needed bronchodilators.  Hope to optimize respiratory status is much as possible for discharge 7/19.  Encouraged incentive spirometry and flutter valve use.   Lobar pneumonia ([place]) Right lobar  pneumonia.  Completed 5 days of Rocephin and Zithromax.  Sepsis ruled out.  No further antibiotics at this time.   Acute renal failure superimposed on stage 3b chronic kidney disease (HCC) Creatinine 2.23 on presentation and improved to 1.30 with GFR 41.  Creatinine at or near baseline   Lactic acidosis Follow-up.  No further IV fluids   Gastroduodenal ulcer Oral Protonix.     Essential hypertension Low-dose Norvasc.   DVT (deep venous thrombosis) (HCC) Continue Eliquis   Iron deficiency anemia Last hemoglobin 10.1.  Ferritin 90.  New p.o. ferrous sulfate   Generalized weakness Physical therapy recommending rehab.  Anticipate discharge 7/19   Malnutrition of moderate degree Continue nutritional supplements   DVT prophylaxis: Apixaban Code Status: DNR Family Communication: Daughter at bedside 10/18 Disposition Plan: Status is: Inpatient Remains inpatient appropriate because: COPD exacerbation.  Still wheezing.  Anticipate discharge 7/19 if wheezing improved.   Level of care: Med-Surg  Consultants:  None  Procedures:  None  Antimicrobials: None   Subjective: Seen and examined.  Sitting up in chair.  No visible distress.  Still wheezing audibly.  Objective: Vitals:   05/24/22 2030 05/25/22 0458 05/25/22 0740 05/25/22 0816  BP: (!) 148/70 (!) 178/85  (!) 160/75  Pulse: 99 98  96  Resp: 15   20  Temp: 98.1 F (36.7 C) 98.1 F (36.7 C)  97.8 F (36.6 C)  TempSrc: Oral Oral  Oral  SpO2: 98% 99% 97% 99%  Weight:      Height:       No intake or output data in the 24 hours ending 05/25/22 [place]  05/18/22 1521  Weight: 53.1 kg    Examination:  General exam: No acute distress.  Appears frail Respiratory system: Diffuse expiratory wheeze in all lung fields.  Normal work of breathing.  2 L Cardiovascular system: S1-S2, RRR, no murmurs, no pedal edema Gastrointestinal system: Thin, soft, NT/ND, normal bowel sounds Central nervous system:  Alert and oriented. No focal neurological deficits. Extremities: Symmetric 5 x 5 power. Skin: No rashes, lesions or ulcers Psychiatry: Judgement and insight appear normal. Mood & affect appropriate.     Data Reviewed: I have personally reviewed following labs and imaging studies  CBC: Recent Labs  Lab 05/18/22 1619 05/20/22 0653 05/21/22 0436 05/23/22 0500 05/25/22 0442  WBC 10.1 11.0*  --  9.5 12.1*  HGB 10.1* 9.4* 9.3* 10.1* 9.8*  HCT 31.6* 28.8*  --  32.3* 30.0*  MCV 96.3 96.6  --  96.1 95.8  PLT 249 260  --  273 269   Basic Metabolic Panel: Recent Labs  Lab 05/18/22 1619 05/20/22 0653 05/21/22 0436 05/23/22 0500 05/25/22 0442  NA 135 146* 145 147* 146*  K 4.2 3.9 4.0 3.9 3.7  CL 101 114* 115* 115* 113*  CO2 23 22 23 25 25   GLUCOSE 105* 128* 109* 96 92  BUN 53* 43* 47* 39* 48*  CREATININE 2.23* 1.43* 1.44* 1.30* 1.19*  CALCIUM 9.5 9.3 9.1 9.0 9.1   GFR: Estimated Creatinine Clearance: 27.8 mL/min (A) (by C-G formula based on SCr of 1.19 mg/dL (H)). Liver Function Tests: No results for input(s): "AST", "ALT", "ALKPHOS", "BILITOT", "PROT", "ALBUMIN" in the last 168 hours. No results for input(s): "LIPASE", "AMYLASE" in the last 168 hours. No results for input(s): "AMMONIA" in the last 168 hours. Coagulation Profile: No results for input(s): "INR", "PROTIME" in the last 168 hours. Cardiac Enzymes: No results for input(s): "CKTOTAL", "CKMB", "CKMBINDEX", "TROPONINI" in the last 168 hours. BNP (last 3 results) No results for input(s): "PROBNP" in the last 8760 hours. HbA1C: No results for input(s): "HGBA1C" in the last 72 hours. CBG: No results for input(s): "GLUCAP" in the last 168 hours. Lipid Profile: No results for input(s): "CHOL", "HDL", "LDLCALC", "TRIG", "CHOLHDL", "LDLDIRECT" in the last 72 hours. Thyroid Function Tests: No results for input(s): "TSH", "T4TOTAL", "FREET4", "T3FREE", "THYROIDAB" in the last 72 hours. Anemia Panel: No results for  input(s): "VITAMINB12", "FOLATE", "FERRITIN", "TIBC", "IRON", "RETICCTPCT" in the last 72 hours. Sepsis Labs: Recent Labs  Lab 05/18/22 1900 05/18/22 2049 05/19/22 0120  PROCALCITON  --  5.12  --   LATICACIDVEN 3.2*  --  3.6*    Recent Results (from the past 240 hour(s))  Resp Panel by RT-PCR (Flu A&B, Covid) Anterior Nasal Swab     Status: None   Collection Time: 05/18/22  7:00 PM   Specimen: Anterior Nasal Swab  Result Value Ref Range Status   SARS Coronavirus 2 by RT PCR NEGATIVE NEGATIVE Final    Comment: (NOTE) SARS-CoV-2 target nucleic acids are NOT DETECTED.  The SARS-CoV-2 RNA is generally detectable in upper respiratory specimens during the acute phase of infection. The lowest concentration of SARS-CoV-2 viral copies this assay can detect is 138 copies/mL. A negative result does not preclude SARS-Cov-2 infection and should not be used as the sole basis for treatment or other patient management decisions. A negative result may occur with  improper specimen collection/handling, submission of specimen other than nasopharyngeal swab, presence of viral mutation(s) within the areas targeted by this assay, and inadequate number of viral copies(<138 copies/mL). A negative result  must be combined with clinical observations, patient history, and epidemiological information. The expected result is Negative.  Fact Sheet for Patients:  EntrepreneurPulse.com.au  Fact Sheet for Healthcare Providers:  IncredibleEmployment.be  This test is no t yet approved or cleared by the [place] FDA and  has been authorized for detection and/or diagnosis of SARS-CoV-2 by FDA under an Emergency Use Authorization (EUA). This EUA will remain  in effect (meaning this test can be used) for the duration of the COVID-19 declaration under Section 564(b)(1) of the Act, 21 U.S.C.section 360bbb-3(b)(1), unless the authorization is terminated  or revoked sooner.        Influenza A by PCR NEGATIVE NEGATIVE Final   Influenza B by PCR NEGATIVE NEGATIVE Final    Comment: (NOTE) The Xpert Xpress SARS-CoV-2/FLU/RSV plus assay is intended as an aid in the diagnosis of influenza from Nasopharyngeal swab specimens and should not be used as a sole basis for treatment. Nasal washings and aspirates are unacceptable for Xpert Xpress SARS-CoV-2/FLU/RSV testing.  Fact Sheet for Patients: EntrepreneurPulse.com.au  Fact Sheet for Healthcare Providers: IncredibleEmployment.be  This test is not yet approved or cleared by the [place] FDA and has been authorized for detection and/or diagnosis of SARS-CoV-2 by FDA under an Emergency Use Authorization (EUA). This EUA will remain in effect (meaning this test can be used) for the duration of the COVID-19 declaration under Section 564(b)(1) of the Act, 21 U.S.C. section 360bbb-3(b)(1), unless the authorization is terminated or revoked.  Performed at [facility], [address] [place], [place] [address]   Blood culture (routine x 2)     Status: None   Collection Time: 05/18/22  7:01 PM   Specimen: BLOOD  Result Value Ref Range Status   Specimen Description BLOOD RIGHT ANTECUBITAL  Final   Special Requests   Final    BOTTLES DRAWN AEROBIC AND ANAEROBIC Blood Culture adequate volume   Culture   Final    NO GROWTH 5 DAYS Performed at [facility], [address], [place], [place] [address]    Report Status 05/23/2022 FINAL  Final  Blood culture (routine x 2)     Status: None   Collection Time: 05/18/22  8:49 PM   Specimen: BLOOD  Result Value Ref Range Status   Specimen Description BLOOD BLOOD RIGHT ARM  Final   Special Requests   Final    BOTTLES DRAWN AEROBIC AND ANAEROBIC Blood Culture adequate volume   Culture   Final    NO GROWTH 5 DAYS Performed at [facility], [address] [place], [place] [address]    Report Status  05/23/2022 FINAL  Final         Radiology Studies: DG Chest Port 1 View  Result Date: 05/24/2022 CLINICAL DATA:  Cough EXAM: PORTABLE CHEST 1 VIEW COMPARISON:  Radiograph 05/18/2022 FINDINGS: Unchanged cardiomediastinal silhouette with loop recorder overlying the left lower chest. Interval improvement in right lower lung airspace opacities. Persistent mild reticulonodular opacities in the right lung base and left basilar subsegmental atelectasis. No pleural effusion. No evidence of pneumothorax. No acute osseous abnormality. IMPRESSION: Interval improvement in right basilar airspace disease with residual reticulonodular opacities. No new or worsening airspace disease. Electronically Signed   By: Maurine Simmering M.D.   On: 05/24/2022 15:12        Scheduled Meds:  amLODipine  2.5 mg Oral Daily   apixaban  2.5 mg Oral BID   calcium-vitamin D  1 tablet Oral Daily   feeding supplement  1 Container Oral TID BM   ferrous sulfate  325 mg Oral Q breakfast   fluticasone furoate-vilanterol  1 puff Inhalation Daily   And   umeclidinium bromide  1 puff Inhalation Daily   gabapentin  100 mg Oral BID   guaiFENesin  600 mg Oral BID   ipratropium-albuterol  3 mL Nebulization Q6H   loratadine  10 mg Oral Daily   methylPREDNISolone (SOLU-MEDROL) injection  40 mg Intravenous Q12H   montelukast  10 mg Oral QHS   multivitamin with minerals  1 tablet Oral Daily   pantoprazole  40 mg Oral Daily   sertraline  100 mg Oral Daily   sodium chloride flush  3 mL Intravenous Q12H   Continuous Infusions:   LOS: 6 days      Sidney Ace, MD [facility]   If 7PM-7AM, please contact night-coverage  05/25/2022, 11:13 AM

## 2022-05-25 NOTE — Progress Notes (Signed)
Nutrition Follow-up  DOCUMENTATION CODES:   Non-severe (moderate) malnutrition in context of chronic illness  INTERVENTION:   -Continue MVI with minerals daily -Continue Boost Breeze po TID, each supplement provides 250 kcal and 9 grams of protein   NUTRITION DIAGNOSIS:   Moderate Malnutrition related to chronic illness (COPD) as evidenced by mild fat depletion, moderate fat depletion, mild muscle depletion, moderate muscle depletion.  Ongoing  GOAL:   Patient will meet greater than or equal to 90% of their needs  Progressing   MONITOR:   PO intake, Supplement acceptance  REASON FOR ASSESSMENT:   Consult Assessment of nutrition requirement/status  ASSESSMENT:   Pt admitted with complaints of shortness of breath with occasional cough runny nose and nasal congestion.  Reviewed I/O's: -130 ml x 24 hours and -3.2 L since admission  UOP: 250 ml x 24 hours   Pt sitting up in bed at time of visit. She was talking on the phone. Noted she consumed 100% of her breakfast. Pt is also cosnuming Boost Breeze supplements.   Per MD notes, plan for discharge tomorrow to SNF.  Medications reviewed and include calcium with vitamin D, ferrous sulfate, and solu-medrol.   Lab Results  Component Value Date   HGBA1C 5.7 (H) 08/21/2016   PTA DM medications are .   Labs reviewed: Na: 146, CBGS: 118 (inpatient orders for glycemic control are none).    Diet Order:   Diet Order             Diet regular Room service appropriate? Yes; Fluid consistency: Thin  Diet effective now                   EDUCATION NEEDS:   Education needs have been addressed  Skin:  Skin Assessment: Reviewed RN Assessment  Last BM:  05/23/22  Height:   Ht Readings from Last 1 Encounters:  05/18/22 5\' 2"  (1.575 m)    Weight:   Wt Readings from Last 1 Encounters:  05/18/22 53.1 kg    Ideal Body Weight:  50 kg  BMI:  Body mass index is 21.4 kg/m.  Estimated Nutritional Needs:   Kcal:   1550-1750  Protein:  80-95 gram  Fluid:  > 1.5 L    Loistine Chance, RD, LDN, [place] Registered Dietitian II Certified Diabetes Care and Education Specialist Please refer to [facility] for RD and/or RD on-call/weekend/after hours pager

## 2022-05-26 DIAGNOSIS — Z8673 Personal history of transient ischemic attack (TIA), and cerebral infarction without residual deficits: Secondary | ICD-10-CM | POA: Diagnosis not present

## 2022-05-26 DIAGNOSIS — G629 Polyneuropathy, unspecified: Secondary | ICD-10-CM | POA: Diagnosis not present

## 2022-05-26 DIAGNOSIS — F329 Major depressive disorder, single episode, unspecified: Secondary | ICD-10-CM | POA: Diagnosis not present

## 2022-05-26 DIAGNOSIS — J449 Chronic obstructive pulmonary disease, unspecified: Secondary | ICD-10-CM | POA: Diagnosis not present

## 2022-05-26 DIAGNOSIS — N178 Other acute kidney failure: Secondary | ICD-10-CM | POA: Diagnosis not present

## 2022-05-26 DIAGNOSIS — M6281 Muscle weakness (generalized): Secondary | ICD-10-CM | POA: Diagnosis not present

## 2022-05-26 DIAGNOSIS — R918 Other nonspecific abnormal finding of lung field: Secondary | ICD-10-CM | POA: Diagnosis not present

## 2022-05-26 DIAGNOSIS — Z66 Do not resuscitate: Secondary | ICD-10-CM | POA: Diagnosis not present

## 2022-05-26 DIAGNOSIS — Z9289 Personal history of other medical treatment: Secondary | ICD-10-CM | POA: Diagnosis not present

## 2022-05-26 DIAGNOSIS — J189 Pneumonia, unspecified organism: Secondary | ICD-10-CM | POA: Diagnosis not present

## 2022-05-26 DIAGNOSIS — J439 Emphysema, unspecified: Secondary | ICD-10-CM | POA: Diagnosis not present

## 2022-05-26 DIAGNOSIS — I129 Hypertensive chronic kidney disease with stage 1 through stage 4 chronic kidney disease, or unspecified chronic kidney disease: Secondary | ICD-10-CM | POA: Diagnosis not present

## 2022-05-26 DIAGNOSIS — R58 Hemorrhage, not elsewhere classified: Secondary | ICD-10-CM | POA: Diagnosis not present

## 2022-05-26 DIAGNOSIS — C7931 Secondary malignant neoplasm of brain: Secondary | ICD-10-CM | POA: Diagnosis not present

## 2022-05-26 DIAGNOSIS — R059 Cough, unspecified: Secondary | ICD-10-CM | POA: Diagnosis not present

## 2022-05-26 DIAGNOSIS — J441 Chronic obstructive pulmonary disease with (acute) exacerbation: Secondary | ICD-10-CM | POA: Diagnosis not present

## 2022-05-26 DIAGNOSIS — F32A Depression, unspecified: Secondary | ICD-10-CM | POA: Diagnosis not present

## 2022-05-26 DIAGNOSIS — I82432 Acute embolism and thrombosis of left popliteal vein: Secondary | ICD-10-CM | POA: Diagnosis not present

## 2022-05-26 DIAGNOSIS — R042 Hemoptysis: Secondary | ICD-10-CM | POA: Diagnosis not present

## 2022-05-26 DIAGNOSIS — K5904 Chronic idiopathic constipation: Secondary | ICD-10-CM | POA: Diagnosis not present

## 2022-05-26 DIAGNOSIS — K219 Gastro-esophageal reflux disease without esophagitis: Secondary | ICD-10-CM | POA: Diagnosis not present

## 2022-05-26 DIAGNOSIS — Z515 Encounter for palliative care: Secondary | ICD-10-CM | POA: Diagnosis not present

## 2022-05-26 DIAGNOSIS — Z9981 Dependence on supplemental oxygen: Secondary | ICD-10-CM | POA: Diagnosis not present

## 2022-05-26 DIAGNOSIS — Z7901 Long term (current) use of anticoagulants: Secondary | ICD-10-CM | POA: Diagnosis not present

## 2022-05-26 DIAGNOSIS — E440 Moderate protein-calorie malnutrition: Secondary | ICD-10-CM | POA: Diagnosis not present

## 2022-05-26 DIAGNOSIS — N183 Chronic kidney disease, stage 3 unspecified: Secondary | ICD-10-CM | POA: Diagnosis not present

## 2022-05-26 DIAGNOSIS — C3431 Malignant neoplasm of lower lobe, right bronchus or lung: Secondary | ICD-10-CM | POA: Diagnosis not present

## 2022-05-26 DIAGNOSIS — Z8249 Family history of ischemic heart disease and other diseases of the circulatory system: Secondary | ICD-10-CM | POA: Diagnosis not present

## 2022-05-26 DIAGNOSIS — J181 Lobar pneumonia, unspecified organism: Secondary | ICD-10-CM | POA: Diagnosis not present

## 2022-05-26 DIAGNOSIS — K5900 Constipation, unspecified: Secondary | ICD-10-CM | POA: Diagnosis not present

## 2022-05-26 DIAGNOSIS — J309 Allergic rhinitis, unspecified: Secondary | ICD-10-CM | POA: Diagnosis not present

## 2022-05-26 DIAGNOSIS — M150 Primary generalized (osteo)arthritis: Secondary | ICD-10-CM | POA: Diagnosis not present

## 2022-05-26 DIAGNOSIS — F419 Anxiety disorder, unspecified: Secondary | ICD-10-CM | POA: Diagnosis not present

## 2022-05-26 DIAGNOSIS — Z955 Presence of coronary angioplasty implant and graft: Secondary | ICD-10-CM | POA: Diagnosis not present

## 2022-05-26 DIAGNOSIS — J9621 Acute and chronic respiratory failure with hypoxia: Secondary | ICD-10-CM | POA: Diagnosis not present

## 2022-05-26 DIAGNOSIS — Z87891 Personal history of nicotine dependence: Secondary | ICD-10-CM | POA: Diagnosis not present

## 2022-05-26 DIAGNOSIS — M1990 Unspecified osteoarthritis, unspecified site: Secondary | ICD-10-CM | POA: Diagnosis not present

## 2022-05-26 DIAGNOSIS — D508 Other iron deficiency anemias: Secondary | ICD-10-CM | POA: Diagnosis not present

## 2022-05-26 DIAGNOSIS — Z8701 Personal history of pneumonia (recurrent): Secondary | ICD-10-CM | POA: Diagnosis not present

## 2022-05-26 DIAGNOSIS — K279 Peptic ulcer, site unspecified, unspecified as acute or chronic, without hemorrhage or perforation: Secondary | ICD-10-CM | POA: Diagnosis not present

## 2022-05-26 DIAGNOSIS — K769 Liver disease, unspecified: Secondary | ICD-10-CM | POA: Diagnosis not present

## 2022-05-26 DIAGNOSIS — R7989 Other specified abnormal findings of blood chemistry: Secondary | ICD-10-CM | POA: Diagnosis not present

## 2022-05-26 DIAGNOSIS — J9601 Acute respiratory failure with hypoxia: Secondary | ICD-10-CM | POA: Diagnosis not present

## 2022-05-26 DIAGNOSIS — D631 Anemia in chronic kidney disease: Secondary | ICD-10-CM | POA: Diagnosis not present

## 2022-05-26 DIAGNOSIS — Z86718 Personal history of other venous thrombosis and embolism: Secondary | ICD-10-CM | POA: Diagnosis not present

## 2022-05-26 DIAGNOSIS — Z20822 Contact with and (suspected) exposure to covid-19: Secondary | ICD-10-CM | POA: Diagnosis not present

## 2022-05-26 DIAGNOSIS — D509 Iron deficiency anemia, unspecified: Secondary | ICD-10-CM | POA: Diagnosis not present

## 2022-05-26 DIAGNOSIS — R4701 Aphasia: Secondary | ICD-10-CM | POA: Diagnosis not present

## 2022-05-26 DIAGNOSIS — I2510 Atherosclerotic heart disease of native coronary artery without angina pectoris: Secondary | ICD-10-CM | POA: Diagnosis not present

## 2022-05-26 DIAGNOSIS — I10 Essential (primary) hypertension: Secondary | ICD-10-CM | POA: Diagnosis not present

## 2022-05-26 DIAGNOSIS — J90 Pleural effusion, not elsewhere classified: Secondary | ICD-10-CM | POA: Diagnosis not present

## 2022-05-26 DIAGNOSIS — N1832 Chronic kidney disease, stage 3b: Secondary | ICD-10-CM | POA: Diagnosis not present

## 2022-05-26 MED ORDER — FERROUS SULFATE 325 (65 FE) MG PO TABS: 325.0000 mg | ORAL_TABLET | Freq: Every day | ORAL | 3 refills | Status: DC

## 2022-05-26 MED ORDER — STERILE WATER FOR INJECTION IJ SOLN
INTRAMUSCULAR | Status: AC
  Administered 2022-05-26: 10 mL
  Filled 2022-05-26: qty 10

## 2022-05-26 MED ORDER — IPRATROPIUM-ALBUTEROL 0.5-2.5 (3) MG/3ML IN SOLN: 3.0000 mL | RESPIRATORY_TRACT | Status: AC | PRN

## 2022-05-26 MED ORDER — PREDNISONE 50 MG PO TABS: 50.0000 mg | ORAL_TABLET | Freq: Every day | ORAL | 0 refills | Status: AC

## 2022-05-26 MED ORDER — STERILE WATER FOR INJECTION IJ SOLN
INTRAMUSCULAR | Status: AC
  Filled 2022-05-26: qty 10

## 2022-05-26 MED ORDER — GUAIFENESIN ER 600 MG PO TB12: 600.0000 mg | ORAL_TABLET | Freq: Two times a day (BID) | ORAL | 0 refills | Status: AC

## 2022-05-26 NOTE — Progress Notes (Signed)
Mobility Specialist - Progress Note   05/26/22 0959  Mobility  Activity Ambulated with assistance in hallway  Level of Assistance Standby assist, set-up cues, supervision of patient - no hands on  Assistive Device Front wheel walker  Distance Ambulated (ft) 160 ft  Activity Response Tolerated well  $Mobility charge 1 Mobility   Pt sitting in recliner on 2L upon arrival. Pt STS and ambulates 1 lap around NS SBA (No hands on). Pt has no LOB during session.  Pt returns to recliner with needs in reach and RN in room.   Gretchen Short  Mobility Specialist  05/26/22 10:01 AM

## 2022-05-26 NOTE — Discharge Summary (Signed)
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  She presented to the hospital with shortness of breath and increasing oxygen requirements.  Was found to have lobar pneumonia and COPD exacerbation.  Started on steroids and antibiotics.  Completed antibiotic course here in the hospital.  Insurance authorization happened on 05/24/2022 but on evaluation had a lot of upper airway congestion and transmitted breath sounds.  10/18: Patient continues to wheeze.  Steroids escalated.  We will anticipate discharge 10/19: Respiratory status stable. Appropriate for dc to SNF    Discharge Diagnoses:  Principal Problem:   Acute on chronic respiratory failure with hypoxia (HCC) Active Problems:   COPD exacerbation (HCC)   Lobar pneumonia (HCC)   Acute renal failure superimposed on stage 3b chronic kidney disease (HCC)   Gastroduodenal ulcer   Lactic acidosis   Essential hypertension   DVT (deep venous thrombosis) (HCC)   Pneumonia   Malnutrition of moderate degree   Generalized weakness   Iron deficiency anemia * Acute on chronic respiratory failure with hypoxia [facility]) ER physician documented an increase use of accessory muscles to breathe and increased work of breathing.  Patient currently on her baseline 2 L of oxygen.  Normal WOB   Acute exacerbation of chronic obstructive pulmonary disease (COPD) ([place]) Patient with substantial low wheeze in all lung fields.   Solu-Medrol was restarted 10/17. Wheezing and air movement improved.  Stable for dc to SNF.  Continue prn nebs, scheduled trelegy.  Continue frequent IS and flutter use.  Prednisone 50mg  QD x 5 days.  FU OP pulmonary and PCP   Lobar pneumonia ([place]) Right lobar pneumonia.  Completed 5 days of Rocephin and Zithromax.  Sepsis ruled out.  No further antibiotics at this time.   Acute renal failure superimposed on stage 3b chronic kidney disease (HCC) Creatinine 2.23 on presentation and improved to 1.30 with GFR 41.  Creatinine at or near baseline   Gastroduodenal ulcer Oral Protonix.     Essential hypertension Low-dose Norvasc.   DVT (deep venous thrombosis) (HCC) Continue Eliquis   Iron deficiency anemia Last hemoglobin 10.1.  Ferritin 90.  New p.o. ferrous sulfate   Generalized weakness Physical therapy recommending rehab.  discharge 7/19   Malnutrition of moderate degree Continue nutritional supplements   Discharge Instructions  Discharge Instructions     Diet - low sodium heart healthy   Complete by: As directed    Increase activity slowly   Complete by: As directed       Allergies as of 05/26/2022       Reactions   Duloxetine Hcl Other (See Comments)   Altered Mental Status   Influenza Vaccines Swelling   At injection site   Venlafaxine Nausea And Vomiting        Medication List     STOP taking these medications    cefUROXime 250 MG tablet Commonly known as: CEFTIN       TAKE these medications    acetaminophen 500 MG tablet Commonly known as: TYLENOL Take 1,000 mg by mouth 2 (two)  times daily.   albuterol 108 (90 Base) MCG/ACT inhaler Commonly known as: ProAir HFA INHALE TWO PUFFS INTO LUNGS EVERY 6 HOURS AS NEEDED   amLODipine 5 MG tablet Commonly known as: NORVASC TAKE 1 TABLET BY MOUTH EVERY DAY   apixaban 2.5 MG Tabs tablet Commonly known as: Eliquis Take 1 tablet (2.5 mg total) by mouth 2 (two) times daily.   Calcium  Carb-Cholecalciferol 600-800 MG-UNIT Tabs Take 1 tablet by mouth daily.   docusate sodium 100 MG capsule Commonly known as: COLACE Take 200 mg by mouth at bedtime.   ferrous sulfate 325 (65 FE) MG tablet Take 1 tablet (325 mg total) by mouth daily with breakfast. Start taking on: May 27, 2022   gabapentin 100 MG capsule Commonly known as: NEURONTIN TAKE 1 CAPSULE (100MG ) BY MOUTH TWICE DAILY What changed: See the new instructions.   guaiFENesin 600 MG 12 hr tablet Commonly known as: MUCINEX Take 1 tablet (600 mg total) by mouth 2 (two) times daily for 5 days.   ipratropium-albuterol 0.5-2.5 (3) MG/3ML Soln Commonly known as: DUONEB Take 3 mLs by nebulization every 4 (four) hours as needed.   Stevphen [place] 24/6 Misc Take 1 tablet by mouth daily.   loratadine 10 MG tablet Commonly known as: CLARITIN Take 10 mg by mouth daily.   montelukast 10 MG tablet Commonly known as: SINGULAIR Take 10 mg by mouth at bedtime.   multivitamin tablet Take 1 tablet by mouth daily.   omeprazole 20 MG capsule Commonly known as: PRILOSEC TAKE 1 CAPSULE BY MOUTH EVERY DAY What changed:  how much to take when to take this   PARoxetine 40 MG tablet Commonly known as: PAXIL Take 40 mg by mouth daily.   predniSONE 50 MG tablet Commonly known as: DELTASONE Take 1 tablet (50 mg total) by mouth daily with breakfast for 5 days. Start taking on: May 27, 2022   Trelegy Ellipta 100-62.5-25 MCG/ACT Aepb Generic drug: Fluticasone-Umeclidin-Vilant Inhale 1 puff into the lungs at bedtime.        Contact information for after-discharge care     [facility] Preferred SNF .   Service: Skilled Nursing Contact information: [place] [address] [phone]                    Allergies  Allergen Reactions   Duloxetine Hcl Other (See Comments)    Altered  Mental Status   Influenza Vaccines Swelling    At injection site   Venlafaxine Nausea And Vomiting    Consultations: None   Procedures/Studies: DG Chest Port 1 View  Result Date: 05/24/2022 CLINICAL DATA:  Cough EXAM: PORTABLE CHEST 1 VIEW COMPARISON:  Radiograph 05/18/2022 FINDINGS: Unchanged cardiomediastinal silhouette with loop recorder overlying the left lower chest. Interval improvement in right lower lung airspace opacities. Persistent mild reticulonodular opacities in the right lung base and left basilar subsegmental atelectasis. No pleural effusion. No evidence of pneumothorax. No acute osseous abnormality. IMPRESSION: Interval improvement in right basilar airspace disease with residual reticulonodular opacities. No new or worsening airspace disease. Electronically Signed   By: Maurine Simmering M.D.   On: 05/24/2022 15:12   DG Chest 2 View  Result Date: 05/18/2022 CLINICAL DATA:  Worsening shortness of breath EXAM: CHEST - 2 VIEW COMPARISON:  Chest radiograph March 14, 2022 FINDINGS: No pleural effusion. No pneumothorax. Loop recorder in place. Calcific plaque in the thoracic and abdominal  aorta. Slightly improved right basilar pulmonary opacity, which now predominantly appears interstitial in nature. Unchanged cardiac and mediastinal contours. No displaced rib fractures. Vertebral body heights are maintained. Degenerative changes of the bilateral AC joints. IMPRESSION: Slightly improved right basilar pulmonary opacity, which now predominantly appears interstitial in nature. This is nonspecific, but may represent scarring in the setting of chronic aspiration. Electronically Signed   By: Marin Roberts M.D.   On: 05/18/2022 15:52      Subjective: Seen and examined of day of dc.  Respiratory status at baseline  Discharge Exam: Vitals:   05/26/22 0404 05/26/22 0908  BP: 134/71 (!) 121/50  Pulse: 92 99  Resp: 18 16  Temp: 98.1 F (36.7 C) 98.3 F (36.8 C)  SpO2: 97% 97%   Vitals:    05/25/22 1926 05/25/22 1939 05/26/22 0404 05/26/22 0908  BP:  (!) 142/72 134/71 (!) 121/50  Pulse:  95 92 99  Resp:  19 18 16   Temp:  97.9 F (36.6 C) 98.1 F (36.7 C) 98.3 F (36.8 C)  TempSrc:      SpO2: 97% 97% 97% 97%  Weight:      Height:        General: Pt is alert, awake, not in acute distress Cardiovascular: RRR, S1/S2 +, no rubs, no gallops Respiratory: Diminished breath sounds, no wheeze, normal WOB.  2L Abdominal: Soft, NT, ND, bowel sounds + Extremities: no edema, no cyanosis    The results of significant diagnostics from this hospitalization (including imaging, microbiology, ancillary and laboratory) are listed below for reference.     Microbiology: Recent Results (from the past 240 hour(s))  Resp Panel by RT-PCR (Flu A&B, Covid) Anterior Nasal Swab     Status: None   Collection Time: 05/18/22  7:00 PM   Specimen: Anterior Nasal Swab  Result Value Ref Range Status   SARS Coronavirus 2 by RT PCR NEGATIVE NEGATIVE Final    Comment: (NOTE) SARS-CoV-2 target nucleic acids are NOT DETECTED.  The SARS-CoV-2 RNA is generally detectable in upper respiratory specimens during the acute phase of infection. The lowest concentration of SARS-CoV-2 viral copies this assay can detect is 138 copies/mL. A negative result does not preclude SARS-Cov-2 infection and should not be used as the sole basis for treatment or other patient management decisions. A negative result may occur with  improper specimen collection/handling, submission of specimen other than nasopharyngeal swab, presence of viral mutation(s) within the areas targeted by this assay, and inadequate number of viral copies(<138 copies/mL). A negative result must be combined with clinical observations, patient history, and epidemiological information. The expected result is Negative.  Fact Sheet for Patients:  EntrepreneurPulse.com.au  Fact Sheet for Healthcare Providers:   IncredibleEmployment.be  This test is no t yet approved or cleared by the [place] FDA and  has been authorized for detection and/or diagnosis of SARS-CoV-2 by FDA under an Emergency Use Authorization (EUA). This EUA will remain  in effect (meaning this test can be used) for the duration of the COVID-19 declaration under Section 564(b)(1) of the Act, 21 U.S.C.section 360bbb-3(b)(1), unless the authorization is terminated  or revoked sooner.       Influenza A by PCR NEGATIVE NEGATIVE Final   Influenza B by PCR NEGATIVE NEGATIVE Final    Comment: (NOTE) The Xpert Xpress SARS-CoV-2/FLU/RSV plus assay is intended as an aid in the diagnosis of influenza from Nasopharyngeal swab specimens and should not be used as a sole basis for treatment. Nasal washings and aspirates are  unacceptable for Xpert Xpress SARS-CoV-2/FLU/RSV testing.  Fact Sheet for Patients: EntrepreneurPulse.com.au  Fact Sheet for Healthcare Providers: IncredibleEmployment.be  This test is not yet approved or cleared by the [place] FDA and has been authorized for detection and/or diagnosis of SARS-CoV-2 by FDA under an Emergency Use Authorization (EUA). This EUA will remain in effect (meaning this test can be used) for the duration of the COVID-19 declaration under Section 564(b)(1) of the Act, 21 U.S.C. section 360bbb-3(b)(1), unless the authorization is terminated or revoked.  Performed at [facility], [address] [place], [place] [address]   Blood culture (routine x 2)     Status: None   Collection Time: 05/18/22  7:01 PM   Specimen: BLOOD  Result Value Ref Range Status   Specimen Description BLOOD RIGHT ANTECUBITAL  Final   Special Requests   Final    BOTTLES DRAWN AEROBIC AND ANAEROBIC Blood Culture adequate volume   Culture   Final    NO GROWTH 5 DAYS Performed at [facility], [address] [place], [place]  [address]    Report Status 05/23/2022 FINAL  Final  Blood culture (routine x 2)     Status: None   Collection Time: 05/18/22  8:49 PM   Specimen: BLOOD  Result Value Ref Range Status   Specimen Description BLOOD BLOOD RIGHT ARM  Final   Special Requests   Final    BOTTLES DRAWN AEROBIC AND ANAEROBIC Blood Culture adequate volume   Culture   Final    NO GROWTH 5 DAYS Performed at [facility], [address] [place], [place] [address]    Report Status 05/23/2022 FINAL  Final     Labs: BNP (last 3 results) No results for input(s): "BNP" in the last 8760 hours. Basic Metabolic Panel: Recent Labs  Lab 05/20/22 0653 05/21/22 0436 05/23/22 0500 05/25/22 0442  NA 146* 145 147* 146*  K 3.9 4.0 3.9 3.7  CL 114* 115* 115* 113*  CO2 22 23 25 25   GLUCOSE 128* 109* 96 92  BUN 43* 47* 39* 48*  CREATININE 1.43* 1.44* 1.30* 1.19*  CALCIUM 9.3 9.1 9.0 9.1   Liver Function Tests: No results for input(s): "AST", "ALT", "ALKPHOS", "BILITOT", "PROT", "ALBUMIN" in the last 168 hours. No results for input(s): "LIPASE", "AMYLASE" in the last 168 hours. No results for input(s): "AMMONIA" in the last 168 hours. CBC: Recent Labs  Lab 05/20/22 0653 05/21/22 0436 05/23/22 0500 05/25/22 0442  WBC 11.0*  --  9.5 12.1*  HGB 9.4* 9.3* 10.1* 9.8*  HCT 28.8*  --  32.3* 30.0*  MCV 96.6  --  96.1 95.8  PLT 260  --  273 263   Cardiac Enzymes: No results for input(s): "CKTOTAL", "CKMB", "CKMBINDEX", "TROPONINI" in the last 168 hours. BNP: Invalid input(s): "POCBNP" CBG: No results for input(s): "GLUCAP" in the last 168 hours. D-Dimer No results for input(s): "DDIMER" in the last 72 hours. Hgb A1c No results for input(s): "HGBA1C" in the last 72 hours. Lipid Profile No results for input(s): "CHOL", "HDL", "LDLCALC", "TRIG", "CHOLHDL", "LDLDIRECT" in the last 72 hours. Thyroid function studies No results for input(s): "TSH", "T4TOTAL", "T3FREE", "THYROIDAB" in the last 72  hours.  Invalid input(s): "FREET3" Anemia work up No results for input(s): "VITAMINB12", "FOLATE", "FERRITIN", "TIBC", "IRON", "RETICCTPCT" in the last 72 hours. Urinalysis    Component Value Date/Time   COLORURINE YELLOW (A) 09/12/2017 1659   APPEARANCEUR HAZY (A) 09/12/2017 [place] 12/15/2016 1654  LABSPEC 1.011 09/12/2017 1659   PHURINE 5.0 09/12/2017 1659   GLUCOSEU NEGATIVE 09/12/2017 1659   HGBUR SMALL (A) 09/12/2017 1659   BILIRUBINUR NEGATIVE 09/12/2017 1659   BILIRUBINUR Negative 12/15/2016 [place] 09/12/2017 1659   PROTEINUR NEGATIVE 09/12/2017 1659   NITRITE NEGATIVE 09/12/2017 1659   LEUKOCYTESUR NEGATIVE 09/12/2017 1659   LEUKOCYTESUR Negative 12/15/2016 1654   Sepsis Labs Recent Labs  Lab 05/20/22 0653 05/23/22 0500 05/25/22 0442  WBC 11.0* 9.5 12.1*   Microbiology Recent Results (from the past 240 hour(s))  Resp Panel by RT-PCR (Flu A&B, Covid) Anterior Nasal Swab     Status: None   Collection Time: 05/18/22  7:00 PM   Specimen: Anterior Nasal Swab  Result Value Ref Range Status   SARS Coronavirus 2 by RT PCR NEGATIVE NEGATIVE Final    Comment: (NOTE) SARS-CoV-2 target nucleic acids are NOT DETECTED.  The SARS-CoV-2 RNA is generally detectable in upper respiratory specimens during the acute phase of infection. The lowest concentration of SARS-CoV-2 viral copies this assay can detect is 138 copies/mL. A negative result does not preclude SARS-Cov-2 infection and should not be used as the sole basis for treatment or other patient management decisions. A negative result may occur with  improper specimen collection/handling, submission of specimen other than nasopharyngeal swab, presence of viral mutation(s) within the areas targeted by this assay, and inadequate number of viral copies(<138 copies/mL). A negative result must be combined with clinical observations, patient history, and epidemiological information. The  expected result is Negative.  Fact Sheet for Patients:  EntrepreneurPulse.com.au  Fact Sheet for Healthcare Providers:  IncredibleEmployment.be  This test is no t yet approved or cleared by the [place] FDA and  has been authorized for detection and/or diagnosis of SARS-CoV-2 by FDA under an Emergency Use Authorization (EUA). This EUA will remain  in effect (meaning this test can be used) for the duration of the COVID-19 declaration under Section 564(b)(1) of the Act, 21 U.S.C.section 360bbb-3(b)(1), unless the authorization is terminated  or revoked sooner.       Influenza A by PCR NEGATIVE NEGATIVE Final   Influenza B by PCR NEGATIVE NEGATIVE Final    Comment: (NOTE) The Xpert Xpress SARS-CoV-2/FLU/RSV plus assay is intended as an aid in the diagnosis of influenza from Nasopharyngeal swab specimens and should not be used as a sole basis for treatment. Nasal washings and aspirates are unacceptable for Xpert Xpress SARS-CoV-2/FLU/RSV testing.  Fact Sheet for Patients: EntrepreneurPulse.com.au  Fact Sheet for Healthcare Providers: IncredibleEmployment.be  This test is not yet approved or cleared by the [place] FDA and has been authorized for detection and/or diagnosis of SARS-CoV-2 by FDA under an Emergency Use Authorization (EUA). This EUA will remain in effect (meaning this test can be used) for the duration of the COVID-19 declaration under Section 564(b)(1) of the Act, 21 U.S.C. section 360bbb-3(b)(1), unless the authorization is terminated or revoked.  Performed at [facility], [address] [place], [place] [address]   Blood culture (routine x 2)     Status: None   Collection Time: 05/18/22  7:01 PM   Specimen: BLOOD  Result Value Ref Range Status   Specimen Description BLOOD RIGHT ANTECUBITAL  Final   Special Requests   Final    BOTTLES DRAWN AEROBIC AND ANAEROBIC  Blood Culture adequate volume   Culture   Final    NO GROWTH 5 DAYS Performed at [facility], [address], [place], [place] [address]  Report Status 05/23/2022 FINAL  Final  Blood culture (routine x 2)     Status: None   Collection Time: 05/18/22  8:49 PM   Specimen: BLOOD  Result Value Ref Range Status   Specimen Description BLOOD BLOOD RIGHT ARM  Final   Special Requests   Final    BOTTLES DRAWN AEROBIC AND ANAEROBIC Blood Culture adequate volume   Culture   Final    NO GROWTH 5 DAYS Performed at [facility], [address], [place], [place] [address]    Report Status 05/23/2022 FINAL  Final     Time coordinating discharge: Over 30 minutes  SIGNED:   Sidney Ace, MD  [facility] 05/26/2022, 9:49 AM Pager   If 7PM-7AM, please contact night-coverage

## 2022-05-26 NOTE — Progress Notes (Signed)
Stephanie [name] to be D/C'd  per MD order.  Discussed with the patient and all questions fully answered.  VSS, Skin clean, dry and intact without evidence of skin break down, no evidence of skin tears noted.  IV catheter discontinued intact. Site without signs and symptoms of complications. Dressing and pressure applied.  An After Visit Summary was printed, DNR placed in chart. and given to the patient's daughter to be given to SNF.   D/c education completed with patient/family including follow up instructions, medication list, d/c activities limitations if indicated, with other d/c instructions as indicated by MD - patient able to verbalize understanding, all questions fully answered.   Patient instructed to return to ED, call 911, or call MD for any changes in condition.   Patient to be escorted via [place], and D/C to [facility] via daughter Stephanie [name]. Report given to [facility].

## 2022-05-27 DIAGNOSIS — D508 Other iron deficiency anemias: Secondary | ICD-10-CM | POA: Diagnosis not present

## 2022-05-27 DIAGNOSIS — J9601 Acute respiratory failure with hypoxia: Secondary | ICD-10-CM | POA: Diagnosis not present

## 2022-05-27 DIAGNOSIS — J181 Lobar pneumonia, unspecified organism: Secondary | ICD-10-CM | POA: Diagnosis not present

## 2022-05-27 DIAGNOSIS — N178 Other acute kidney failure: Secondary | ICD-10-CM | POA: Diagnosis not present

## 2022-05-27 DIAGNOSIS — I2510 Atherosclerotic heart disease of native coronary artery without angina pectoris: Secondary | ICD-10-CM | POA: Diagnosis not present

## 2022-05-27 DIAGNOSIS — M6281 Muscle weakness (generalized): Secondary | ICD-10-CM | POA: Diagnosis not present

## 2022-05-27 DIAGNOSIS — K5904 Chronic idiopathic constipation: Secondary | ICD-10-CM | POA: Diagnosis not present

## 2022-05-27 DIAGNOSIS — J441 Chronic obstructive pulmonary disease with (acute) exacerbation: Secondary | ICD-10-CM | POA: Diagnosis not present

## 2022-05-30 DIAGNOSIS — J181 Lobar pneumonia, unspecified organism: Secondary | ICD-10-CM | POA: Diagnosis not present

## 2022-05-30 DIAGNOSIS — J441 Chronic obstructive pulmonary disease with (acute) exacerbation: Secondary | ICD-10-CM | POA: Diagnosis not present

## 2022-05-30 DIAGNOSIS — M6281 Muscle weakness (generalized): Secondary | ICD-10-CM | POA: Diagnosis not present

## 2022-05-30 DIAGNOSIS — N178 Other acute kidney failure: Secondary | ICD-10-CM | POA: Diagnosis not present

## 2022-05-30 DIAGNOSIS — K5904 Chronic idiopathic constipation: Secondary | ICD-10-CM | POA: Diagnosis not present

## 2022-05-30 DIAGNOSIS — I2510 Atherosclerotic heart disease of native coronary artery without angina pectoris: Secondary | ICD-10-CM | POA: Diagnosis not present

## 2022-05-30 DIAGNOSIS — J9601 Acute respiratory failure with hypoxia: Secondary | ICD-10-CM | POA: Diagnosis not present

## 2022-05-30 DIAGNOSIS — D508 Other iron deficiency anemias: Secondary | ICD-10-CM | POA: Diagnosis not present

## 2022-06-02 DIAGNOSIS — J441 Chronic obstructive pulmonary disease with (acute) exacerbation: Secondary | ICD-10-CM | POA: Diagnosis not present

## 2022-06-02 DIAGNOSIS — J181 Lobar pneumonia, unspecified organism: Secondary | ICD-10-CM | POA: Diagnosis not present

## 2022-06-02 DIAGNOSIS — K5904 Chronic idiopathic constipation: Secondary | ICD-10-CM | POA: Diagnosis not present

## 2022-06-02 DIAGNOSIS — M6281 Muscle weakness (generalized): Secondary | ICD-10-CM | POA: Diagnosis not present

## 2022-06-02 DIAGNOSIS — I2510 Atherosclerotic heart disease of native coronary artery without angina pectoris: Secondary | ICD-10-CM | POA: Diagnosis not present

## 2022-06-02 DIAGNOSIS — I10 Essential (primary) hypertension: Secondary | ICD-10-CM | POA: Diagnosis not present

## 2022-06-02 DIAGNOSIS — D508 Other iron deficiency anemias: Secondary | ICD-10-CM | POA: Diagnosis not present

## 2022-06-02 DIAGNOSIS — J9601 Acute respiratory failure with hypoxia: Secondary | ICD-10-CM | POA: Diagnosis not present

## 2022-06-02 DIAGNOSIS — M150 Primary generalized (osteo)arthritis: Secondary | ICD-10-CM | POA: Diagnosis not present

## 2022-06-02 DIAGNOSIS — N178 Other acute kidney failure: Secondary | ICD-10-CM | POA: Diagnosis not present

## 2022-06-03 DIAGNOSIS — J441 Chronic obstructive pulmonary disease with (acute) exacerbation: Secondary | ICD-10-CM | POA: Diagnosis not present

## 2022-06-03 DIAGNOSIS — J181 Lobar pneumonia, unspecified organism: Secondary | ICD-10-CM | POA: Diagnosis not present

## 2022-06-03 DIAGNOSIS — D508 Other iron deficiency anemias: Secondary | ICD-10-CM | POA: Diagnosis not present

## 2022-06-03 DIAGNOSIS — M6281 Muscle weakness (generalized): Secondary | ICD-10-CM | POA: Diagnosis not present

## 2022-06-03 DIAGNOSIS — K5904 Chronic idiopathic constipation: Secondary | ICD-10-CM | POA: Diagnosis not present

## 2022-06-03 DIAGNOSIS — J9601 Acute respiratory failure with hypoxia: Secondary | ICD-10-CM | POA: Diagnosis not present

## 2022-06-03 DIAGNOSIS — I2510 Atherosclerotic heart disease of native coronary artery without angina pectoris: Secondary | ICD-10-CM | POA: Diagnosis not present

## 2022-06-03 DIAGNOSIS — N178 Other acute kidney failure: Secondary | ICD-10-CM | POA: Diagnosis not present

## 2022-06-06 DIAGNOSIS — J9601 Acute respiratory failure with hypoxia: Secondary | ICD-10-CM | POA: Diagnosis not present

## 2022-06-06 DIAGNOSIS — N178 Other acute kidney failure: Secondary | ICD-10-CM | POA: Diagnosis not present

## 2022-06-06 DIAGNOSIS — K5904 Chronic idiopathic constipation: Secondary | ICD-10-CM | POA: Diagnosis not present

## 2022-06-06 DIAGNOSIS — I2510 Atherosclerotic heart disease of native coronary artery without angina pectoris: Secondary | ICD-10-CM | POA: Diagnosis not present

## 2022-06-06 DIAGNOSIS — J441 Chronic obstructive pulmonary disease with (acute) exacerbation: Secondary | ICD-10-CM | POA: Diagnosis not present

## 2022-06-06 DIAGNOSIS — D508 Other iron deficiency anemias: Secondary | ICD-10-CM | POA: Diagnosis not present

## 2022-06-06 DIAGNOSIS — M6281 Muscle weakness (generalized): Secondary | ICD-10-CM | POA: Diagnosis not present

## 2022-06-06 DIAGNOSIS — J181 Lobar pneumonia, unspecified organism: Secondary | ICD-10-CM | POA: Diagnosis not present

## 2022-06-09 DIAGNOSIS — J449 Chronic obstructive pulmonary disease, unspecified: Secondary | ICD-10-CM | POA: Diagnosis not present

## 2022-06-09 DIAGNOSIS — J189 Pneumonia, unspecified organism: Secondary | ICD-10-CM | POA: Diagnosis not present

## 2022-06-09 DIAGNOSIS — R918 Other nonspecific abnormal finding of lung field: Secondary | ICD-10-CM | POA: Diagnosis not present

## 2022-06-09 DIAGNOSIS — Z9981 Dependence on supplemental oxygen: Secondary | ICD-10-CM | POA: Diagnosis not present

## 2022-06-09 DIAGNOSIS — J90 Pleural effusion, not elsewhere classified: Secondary | ICD-10-CM | POA: Diagnosis not present

## 2022-06-09 DIAGNOSIS — Z9289 Personal history of other medical treatment: Secondary | ICD-10-CM | POA: Diagnosis not present

## 2022-06-10 ENCOUNTER — Emergency Department: Payer: PPO

## 2022-06-10 ENCOUNTER — Other Ambulatory Visit: Payer: Self-pay

## 2022-06-10 ENCOUNTER — Encounter: Payer: Self-pay | Admitting: Emergency Medicine

## 2022-06-10 ENCOUNTER — Inpatient Hospital Stay
Admission: EM | Admit: 2022-06-10 | Discharge: 2022-06-13 | DRG: 180 | Disposition: A | Discharge status: Hospice | Payer: PPO | Source: Skilled Nursing Facility | Attending: Internal Medicine | Admitting: Internal Medicine

## 2022-06-10 DIAGNOSIS — N183 Chronic kidney disease, stage 3 unspecified: Secondary | ICD-10-CM | POA: Diagnosis present

## 2022-06-10 DIAGNOSIS — F419 Anxiety disorder, unspecified: Secondary | ICD-10-CM | POA: Diagnosis present

## 2022-06-10 DIAGNOSIS — I10 Essential (primary) hypertension: Secondary | ICD-10-CM | POA: Diagnosis present

## 2022-06-10 DIAGNOSIS — D631 Anemia in chronic kidney disease: Secondary | ICD-10-CM | POA: Diagnosis present

## 2022-06-10 DIAGNOSIS — J439 Emphysema, unspecified: Secondary | ICD-10-CM | POA: Diagnosis not present

## 2022-06-10 DIAGNOSIS — R7989 Other specified abnormal findings of blood chemistry: Secondary | ICD-10-CM | POA: Diagnosis not present

## 2022-06-10 DIAGNOSIS — I129 Hypertensive chronic kidney disease with stage 1 through stage 4 chronic kidney disease, or unspecified chronic kidney disease: Secondary | ICD-10-CM | POA: Diagnosis present

## 2022-06-10 DIAGNOSIS — Z9981 Dependence on supplemental oxygen: Secondary | ICD-10-CM | POA: Diagnosis not present

## 2022-06-10 DIAGNOSIS — I2510 Atherosclerotic heart disease of native coronary artery without angina pectoris: Secondary | ICD-10-CM | POA: Diagnosis present

## 2022-06-10 DIAGNOSIS — Z86718 Personal history of other venous thrombosis and embolism: Secondary | ICD-10-CM

## 2022-06-10 DIAGNOSIS — Z8249 Family history of ischemic heart disease and other diseases of the circulatory system: Secondary | ICD-10-CM

## 2022-06-10 DIAGNOSIS — Z87891 Personal history of nicotine dependence: Secondary | ICD-10-CM

## 2022-06-10 DIAGNOSIS — Z8701 Personal history of pneumonia (recurrent): Secondary | ICD-10-CM

## 2022-06-10 DIAGNOSIS — Z66 Do not resuscitate: Secondary | ICD-10-CM | POA: Diagnosis present

## 2022-06-10 DIAGNOSIS — Z515 Encounter for palliative care: Secondary | ICD-10-CM

## 2022-06-10 DIAGNOSIS — I959 Hypotension, unspecified: Secondary | ICD-10-CM | POA: Diagnosis not present

## 2022-06-10 DIAGNOSIS — K769 Liver disease, unspecified: Secondary | ICD-10-CM | POA: Diagnosis not present

## 2022-06-10 DIAGNOSIS — J9621 Acute and chronic respiratory failure with hypoxia: Secondary | ICD-10-CM | POA: Diagnosis not present

## 2022-06-10 DIAGNOSIS — J441 Chronic obstructive pulmonary disease with (acute) exacerbation: Secondary | ICD-10-CM | POA: Diagnosis present

## 2022-06-10 DIAGNOSIS — F32A Depression, unspecified: Secondary | ICD-10-CM | POA: Diagnosis not present

## 2022-06-10 DIAGNOSIS — I82409 Acute embolism and thrombosis of unspecified deep veins of unspecified lower extremity: Secondary | ICD-10-CM | POA: Diagnosis present

## 2022-06-10 DIAGNOSIS — Z20822 Contact with and (suspected) exposure to covid-19: Secondary | ICD-10-CM | POA: Diagnosis present

## 2022-06-10 DIAGNOSIS — N281 Cyst of kidney, acquired: Secondary | ICD-10-CM | POA: Diagnosis not present

## 2022-06-10 DIAGNOSIS — D508 Other iron deficiency anemias: Secondary | ICD-10-CM | POA: Diagnosis not present

## 2022-06-10 DIAGNOSIS — I82432 Acute embolism and thrombosis of left popliteal vein: Secondary | ICD-10-CM

## 2022-06-10 DIAGNOSIS — N178 Other acute kidney failure: Secondary | ICD-10-CM | POA: Diagnosis not present

## 2022-06-10 DIAGNOSIS — Z7901 Long term (current) use of anticoagulants: Secondary | ICD-10-CM

## 2022-06-10 DIAGNOSIS — R059 Cough, unspecified: Secondary | ICD-10-CM | POA: Diagnosis not present

## 2022-06-10 DIAGNOSIS — Z841 Family history of disorders of kidney and ureter: Secondary | ICD-10-CM

## 2022-06-10 DIAGNOSIS — Z7401 Bed confinement status: Secondary | ICD-10-CM | POA: Diagnosis not present

## 2022-06-10 DIAGNOSIS — R042 Hemoptysis: Principal | ICD-10-CM

## 2022-06-10 DIAGNOSIS — G319 Degenerative disease of nervous system, unspecified: Secondary | ICD-10-CM | POA: Diagnosis not present

## 2022-06-10 DIAGNOSIS — C7931 Secondary malignant neoplasm of brain: Secondary | ICD-10-CM | POA: Diagnosis present

## 2022-06-10 DIAGNOSIS — J9601 Acute respiratory failure with hypoxia: Secondary | ICD-10-CM | POA: Diagnosis not present

## 2022-06-10 DIAGNOSIS — C3431 Malignant neoplasm of lower lobe, right bronchus or lung: Principal | ICD-10-CM | POA: Diagnosis present

## 2022-06-10 DIAGNOSIS — Z8719 Personal history of other diseases of the digestive system: Secondary | ICD-10-CM

## 2022-06-10 DIAGNOSIS — Z887 Allergy status to serum and vaccine status: Secondary | ICD-10-CM

## 2022-06-10 DIAGNOSIS — Z7951 Long term (current) use of inhaled steroids: Secondary | ICD-10-CM

## 2022-06-10 DIAGNOSIS — M6281 Muscle weakness (generalized): Secondary | ICD-10-CM | POA: Diagnosis not present

## 2022-06-10 DIAGNOSIS — K219 Gastro-esophageal reflux disease without esophagitis: Secondary | ICD-10-CM | POA: Diagnosis present

## 2022-06-10 DIAGNOSIS — R58 Hemorrhage, not elsewhere classified: Secondary | ICD-10-CM | POA: Diagnosis not present

## 2022-06-10 DIAGNOSIS — Z888 Allergy status to other drugs, medicaments and biological substances status: Secondary | ICD-10-CM

## 2022-06-10 DIAGNOSIS — G629 Polyneuropathy, unspecified: Secondary | ICD-10-CM | POA: Diagnosis not present

## 2022-06-10 DIAGNOSIS — K7689 Other specified diseases of liver: Secondary | ICD-10-CM | POA: Diagnosis not present

## 2022-06-10 DIAGNOSIS — Z9861 Coronary angioplasty status: Secondary | ICD-10-CM

## 2022-06-10 DIAGNOSIS — I63412 Cerebral infarction due to embolism of left middle cerebral artery: Secondary | ICD-10-CM | POA: Diagnosis present

## 2022-06-10 DIAGNOSIS — Z8673 Personal history of transient ischemic attack (TIA), and cerebral infarction without residual deficits: Secondary | ICD-10-CM

## 2022-06-10 DIAGNOSIS — R918 Other nonspecific abnormal finding of lung field: Secondary | ICD-10-CM

## 2022-06-10 DIAGNOSIS — D509 Iron deficiency anemia, unspecified: Secondary | ICD-10-CM | POA: Diagnosis present

## 2022-06-10 DIAGNOSIS — J181 Lobar pneumonia, unspecified organism: Secondary | ICD-10-CM | POA: Diagnosis not present

## 2022-06-10 DIAGNOSIS — R053 Chronic cough: Secondary | ICD-10-CM | POA: Diagnosis present

## 2022-06-10 DIAGNOSIS — R54 Age-related physical debility: Secondary | ICD-10-CM | POA: Diagnosis present

## 2022-06-10 DIAGNOSIS — C3491 Malignant neoplasm of unspecified part of right bronchus or lung: Secondary | ICD-10-CM | POA: Diagnosis not present

## 2022-06-10 DIAGNOSIS — Z800 Family history of malignant neoplasm of digestive organs: Secondary | ICD-10-CM

## 2022-06-10 DIAGNOSIS — Z79899 Other long term (current) drug therapy: Secondary | ICD-10-CM

## 2022-06-10 DIAGNOSIS — M1990 Unspecified osteoarthritis, unspecified site: Secondary | ICD-10-CM | POA: Diagnosis present

## 2022-06-10 DIAGNOSIS — Z789 Other specified health status: Secondary | ICD-10-CM | POA: Insufficient documentation

## 2022-06-10 LAB — BASIC METABOLIC PANEL
Anion gap: 6 (ref 5–15)
BUN: 36 mg/dL — ABNORMAL HIGH (ref 8–23)
CO2: 25 mmol/L (ref 22–32)
Calcium: 9.2 mg/dL (ref 8.9–10.3)
Chloride: 108 mmol/L (ref 98–111)
Creatinine, Ser: 1.18 mg/dL — ABNORMAL HIGH (ref 0.44–1.00)
GFR, Estimated: 46 mL/min — ABNORMAL LOW (ref >60)
Glucose, Bld: 94 mg/dL (ref 70–99)
Potassium: 4.3 mmol/L (ref 3.5–5.1)
Sodium: 139 mmol/L (ref 135–145)

## 2022-06-10 LAB — CBC WITH DIFFERENTIAL/PLATELET
Abs Immature Granulocytes: 0.14 10*3/uL — ABNORMAL HIGH (ref 0.00–0.07)
Basophils Absolute: 0 10*3/uL (ref 0.0–0.1)
Basophils Relative: 0 %
Eosinophils Absolute: 0 10*3/uL (ref 0.0–0.5)
Eosinophils Relative: 0 %
HCT: 30.9 % — ABNORMAL LOW (ref 36.0–46.0)
Hemoglobin: 9.8 g/dL — ABNORMAL LOW (ref 12.0–15.0)
Immature Granulocytes: 1 %
Lymphocytes Relative: 9 %
Lymphs Abs: 1 10*3/uL (ref 0.7–4.0)
MCH: 30.3 pg (ref 26.0–34.0)
MCHC: 31.7 g/dL (ref 30.0–36.0)
MCV: 95.7 fL (ref 80.0–100.0)
Monocytes Absolute: 0.7 10*3/uL (ref 0.1–1.0)
Monocytes Relative: 7 %
Neutro Abs: 9.2 10*3/uL — ABNORMAL HIGH (ref 1.7–7.7)
Neutrophils Relative %: 83 %
Platelets: 252 10*3/uL (ref 150–400)
RBC: 3.23 MIL/uL — ABNORMAL LOW (ref 3.87–5.11)
RDW: 15.2 % (ref 11.5–15.5)
WBC: 11 10*3/uL — ABNORMAL HIGH (ref 4.0–10.5)
nRBC: 0 % (ref 0.0–0.2)

## 2022-06-10 LAB — RESP PANEL BY RT-PCR (FLU A&B, COVID) ARPGX2
Influenza A by PCR: NEGATIVE
Influenza B by PCR: NEGATIVE
SARS Coronavirus 2 by RT PCR: NEGATIVE

## 2022-06-10 LAB — PROTIME-INR
INR: 1.2 (ref 0.8–1.2)
Prothrombin Time: 15 seconds (ref 11.4–15.2)

## 2022-06-10 LAB — TYPE AND SCREEN
ABO/RH(D): A POS
Antibody Screen: NEGATIVE

## 2022-06-10 LAB — TROPONIN I (HIGH SENSITIVITY)
Troponin I (High Sensitivity): 49 ng/L — ABNORMAL HIGH (ref <18)
Troponin I (High Sensitivity): 51 ng/L — ABNORMAL HIGH (ref <18)
Troponin I (High Sensitivity): 50 ng/L — ABNORMAL HIGH (ref <18)

## 2022-06-10 LAB — BRAIN NATRIURETIC PEPTIDE: B Natriuretic Peptide: 125 pg/mL — ABNORMAL HIGH (ref 0.0–100.0)

## 2022-06-10 LAB — MRSA NEXT GEN BY PCR, NASAL: MRSA by PCR Next Gen: NOT DETECTED

## 2022-06-10 MED ORDER — IPRATROPIUM-ALBUTEROL 0.5-2.5 (3) MG/3ML IN SOLN: 3.0000 mL | Freq: Four times a day (QID) | RESPIRATORY_TRACT | Status: DC | PRN

## 2022-06-10 MED ORDER — LACTATED RINGERS IV SOLN: INTRAVENOUS | Status: DC

## 2022-06-10 MED ORDER — ALBUTEROL SULFATE (2.5 MG/3ML) 0.083% IN NEBU: 2.5000 mg | INHALATION_SOLUTION | RESPIRATORY_TRACT | Status: DC | PRN

## 2022-06-10 MED ORDER — LORATADINE 10 MG PO TABS
10.0000 mg | ORAL_TABLET | Freq: Every day | ORAL | Status: DC
  Administered 2022-06-11 – 2022-06-13 (×3): 10 mg via ORAL
  Filled 2022-06-10 (×3): qty 1

## 2022-06-10 MED ORDER — MONTELUKAST SODIUM 10 MG PO TABS
10.0000 mg | ORAL_TABLET | Freq: Every day | ORAL | Status: DC
  Administered 2022-06-10 – 2022-06-12 (×3): 10 mg via ORAL
  Filled 2022-06-10 (×3): qty 1

## 2022-06-10 MED ORDER — UMECLIDINIUM BROMIDE 62.5 MCG/ACT IN AEPB
1.0000 | INHALATION_SPRAY | Freq: Every day | RESPIRATORY_TRACT | Status: DC
  Administered 2022-06-11: 1 via RESPIRATORY_TRACT
  Filled 2022-06-10: qty 7

## 2022-06-10 MED ORDER — FLUTICASONE FUROATE-VILANTEROL 100-25 MCG/ACT IN AEPB
1.0000 | INHALATION_SPRAY | Freq: Every day | RESPIRATORY_TRACT | Status: DC
  Administered 2022-06-11: 1 via RESPIRATORY_TRACT
  Filled 2022-06-10: qty 28

## 2022-06-10 MED ORDER — PAROXETINE HCL 20 MG PO TABS
40.0000 mg | ORAL_TABLET | Freq: Every day | ORAL | Status: DC
  Administered 2022-06-11 – 2022-06-13 (×3): 40 mg via ORAL
  Filled 2022-06-10 (×3): qty 2

## 2022-06-10 MED ORDER — SODIUM CHLORIDE 0.9 % IV SOLN
2.0000 g | Freq: Once | INTRAVENOUS | Status: AC
  Administered 2022-06-10: 2 g via INTRAVENOUS
  Filled 2022-06-10: qty 12.5

## 2022-06-10 MED ORDER — IOHEXOL 350 MG/ML SOLN
75.0000 mL | Freq: Once | INTRAVENOUS | Status: AC | PRN
  Administered 2022-06-10: 59 mL via INTRAVENOUS

## 2022-06-10 MED ORDER — ONDANSETRON HCL 4 MG/2ML IJ SOLN: 4.0000 mg | Freq: Four times a day (QID) | INTRAMUSCULAR | Status: DC | PRN

## 2022-06-10 MED ORDER — ACETAMINOPHEN 650 MG RE SUPP: 650.0000 mg | Freq: Four times a day (QID) | RECTAL | Status: DC | PRN

## 2022-06-10 MED ORDER — OXYCODONE HCL 5 MG PO TABS: 5.0000 mg | ORAL_TABLET | ORAL | Status: DC | PRN

## 2022-06-10 MED ORDER — SODIUM CHLORIDE 0.9 % IV SOLN: 2.0000 g | INTRAVENOUS | Status: DC

## 2022-06-10 MED ORDER — POLYETHYLENE GLYCOL 3350 17 G PO PACK: 17.0000 g | PACK | Freq: Every day | ORAL | Status: DC | PRN

## 2022-06-10 MED ORDER — SODIUM CHLORIDE 0.9 % IV SOLN
2.0000 g | INTRAVENOUS | Status: DC
  Administered 2022-06-11 – 2022-06-12 (×2): 2 g via INTRAVENOUS
  Filled 2022-06-10: qty 2
  Filled 2022-06-10: qty 20
  Filled 2022-06-10: qty 2

## 2022-06-10 MED ORDER — VANCOMYCIN HCL IN DEXTROSE 1-5 GM/200ML-% IV SOLN
1000.0000 mg | Freq: Once | INTRAVENOUS | Status: AC
  Administered 2022-06-10: 1000 mg via INTRAVENOUS
  Filled 2022-06-10: qty 200

## 2022-06-10 MED ORDER — SODIUM CHLORIDE 0.9 % IV SOLN
500.0000 mg | Freq: Once | INTRAVENOUS | Status: AC
  Administered 2022-06-10: 500 mg via INTRAVENOUS
  Filled 2022-06-10: qty 5

## 2022-06-10 MED ORDER — PANTOPRAZOLE SODIUM 40 MG PO TBEC
40.0000 mg | DELAYED_RELEASE_TABLET | Freq: Every day | ORAL | Status: DC
  Administered 2022-06-11 – 2022-06-13 (×3): 40 mg via ORAL
  Filled 2022-06-10 (×3): qty 1

## 2022-06-10 MED ORDER — SODIUM CHLORIDE 0.9 % IV SOLN
500.0000 mg | INTRAVENOUS | Status: DC
  Administered 2022-06-11 – 2022-06-12 (×2): 500 mg via INTRAVENOUS
  Filled 2022-06-10: qty 500
  Filled 2022-06-10 (×2): qty 5

## 2022-06-10 MED ORDER — ONDANSETRON HCL 4 MG PO TABS: 4.0000 mg | ORAL_TABLET | Freq: Four times a day (QID) | ORAL | Status: DC | PRN

## 2022-06-10 MED ORDER — SODIUM CHLORIDE 0.9% FLUSH
3.0000 mL | Freq: Two times a day (BID) | INTRAVENOUS | Status: DC
  Administered 2022-06-10 – 2022-06-13 (×6): 3 mL via INTRAVENOUS

## 2022-06-10 MED ORDER — AMLODIPINE BESYLATE 5 MG PO TABS
5.0000 mg | ORAL_TABLET | Freq: Every day | ORAL | Status: DC
  Administered 2022-06-11 – 2022-06-13 (×3): 5 mg via ORAL
  Filled 2022-06-10 (×3): qty 1

## 2022-06-10 MED ORDER — LACTATED RINGERS IV SOLN: INTRAVENOUS | Status: AC

## 2022-06-10 MED ORDER — METOPROLOL TARTRATE 5 MG/5ML IV SOLN: 5.0000 mg | Freq: Four times a day (QID) | INTRAVENOUS | Status: DC | PRN

## 2022-06-10 MED ORDER — GABAPENTIN 100 MG PO CAPS
100.0000 mg | ORAL_CAPSULE | Freq: Two times a day (BID) | ORAL | Status: DC
  Administered 2022-06-10 – 2022-06-13 (×6): 100 mg via ORAL
  Filled 2022-06-10 (×6): qty 1

## 2022-06-10 MED ORDER — TRAZODONE HCL 50 MG PO TABS
25.0000 mg | ORAL_TABLET | Freq: Every evening | ORAL | Status: DC | PRN
  Administered 2022-06-10 – 2022-06-12 (×3): 25 mg via ORAL
  Filled 2022-06-10 (×3): qty 1

## 2022-06-10 MED ORDER — ACETAMINOPHEN 325 MG PO TABS: 650.0000 mg | ORAL_TABLET | Freq: Four times a day (QID) | ORAL | Status: DC | PRN

## 2022-06-10 MED ORDER — SODIUM CHLORIDE 0.9 % IV SOLN: 500.0000 mg | INTRAVENOUS | Status: DC

## 2022-06-10 NOTE — Assessment & Plan Note (Addendum)
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  Not having any acute bleeding at present but high risk for significant bleeding given etiology as well as being on long-term anticoagulation.  Will need bronchoscopy for tissue diagnosis, however will need to discuss plan with hematology as far as timing given anticoagulation.  Does have postobstructive changes, no overt signs of infection at present.  On brief literature review unfortunately procalcitonin appears to be a little clinical value in this population to help distinguish presence of infection.  At present will de-escalate antibiotics and keep them on pending pulmonology consultation. - Hold Eliquis - Consult heme-onc and pulmonology in a.m. to coordinate tissue diagnosis - Type and screen ordered and pending - Empiric Treatment with ceftriaxone and azithromycin - Maintain 2 large-bore IVs

## 2022-06-10 NOTE — ED Notes (Signed)
First Nurse Note: Pt to ED via [facility] from [facility] for coughing up blood since last night. Pt had pneumonia in August. Pt has hx/o COPD. Pt is not in any pain.   Pt is on 2 liter of Oxygen via [place] chronically.

## 2022-06-10 NOTE — H&P (Signed)
History and Physical    Patient: Stephanie [name] DOB: May 06, 1936 DOA: 06/10/2022 DOS: the patient was seen and examined on 06/10/2022 PCP: Adin Hector, MD  Patient coming from: Home  Chief Complaint:  Chief Complaint  Patient presents with   Hemoptysis   HPI: Stephanie [name] is a 86 y.o. female with medical history significant of COPD with acute on chronic respiratory failure on 2 L of home O2, history of CVA, history of DVT on long-term anticoagulation, hypertension, CKD stage III, anxiety and depression, who presents with report of coughing blood.  Of note patient has been admitted twice in the past 3 months for COPD exacerbations and pneumonias.  During admission in August she was started on home O2, her most recent admission ended on October 19.  History obtained from patient and her daughter, with her daughter providing majority of the history.  They report that late last night she began to cough more frequently and then this morning noticed there was a significant amount of blood in her phlegm.  They report that it was not a significant amount but were unable to give a good quantification of the amount.  She reports she has not missed any medications recently.  She has been living at [facility] of late since her last admission.  She currently endorses her baseline shortness of breath but nothing additional, no chest pain, no lower extremity edema.  In the ED vital signs with mild hypertension but normal oxygenation on home 2 L of oxygen, remainder vital signs unremarkable.  CBC showed borderline leukocytosis and stable anemia with hemoglobin of 9.8, but was otherwise unremarkable as was her BMP which showed creatinine at baseline (1.1).  Troponin was checked and was initially elevated at 49, on repeat almost 4 hours later was flat at 51, this was consistent with last check approximately 3 weeks ago.  CTPA was obtained which showed endobronchial lesion in  the right mainstem bronchus causing complete occlusion of the right lower lobe bronchus concerning for primary lung neoplasm, as well as other findings concerning for postobstructive changes and metastatic disease.  She was started on vancomycin, cefepime, azithromycin.  She was admitted for expedited malignancy work-up as well as observation and management of her hemoptysis.   Review of Systems: As mentioned in the history of present illness. All other systems reviewed and are negative. Past Medical History:  Diagnosis Date   Arthritis    Baker's cyst of knee, right    a. 08/2016 U/S: Baker's cyst noted in R popliteal fossa.   Complication of anesthesia    COPD (chronic obstructive pulmonary disease) ([place])    a. Home O2 started 08/2017.   Coronary artery disease    a. prior h/o stenting ~ 2005.   Depression    Edema    Expressive aphasia    GI bleed 11/14/2013   History of cardiac monitoring    a. 08/2016 s/p LINQ placement following CVA. No AFib to date (09/2017).   History of pneumonia    Hx of ischemic left MCA stroke    a. 08/2016 large posterior L MCA territory infarct in the setting of LLE DVT and suggestion of PFO on transcranial dopplers w/ bubble study (no PFO reported on echocardiogram)-->Eliquis.   Hypertension    Left leg DVT ([place])    a. 08/2016 U/S: subacute DVT in prox L popliteal vein w/ chronic superficial thrombosis in the L greater saphenous vein from the mid to prox L calf-->Eliquis.  PFO (patent foramen ovale)    a. 08/2016 Abnl TCD w/ bubble study suggesting large PFO. Not reported on echocardiogram.   PONV (postoperative nausea and vomiting)    Past Surgical History:  Procedure Laterality Date   BALLOON ANGIOPLASTY, ARTERY  05/17/2009   CATARACT EXTRACTION W/PHACO Left 05/24/2016   Procedure: CATARACT EXTRACTION PHACO AND INTRAOCULAR LENS PLACEMENT (IOC);  Surgeon: Birder Robson, MD;  Location: [facility];  Service: Ophthalmology;  Laterality: Left;  Lot#  [id number] H [place]:   00:52.3 AP%:   27.3 CDE:  14.26    CATARACT EXTRACTION W/PHACO Right 06/14/2016   Procedure: CATARACT EXTRACTION PHACO AND INTRAOCULAR LENS PLACEMENT ([place]);  Surgeon: Birder Robson, MD;  Location: [facility];  Service: Ophthalmology;  Laterality: Right;  Lot# [id number] H [place]: 01:13.4 AP%: 24.4 CDE: 17.85   CORONARY ANGIOPLASTY     STENT   EP IMPLANTABLE DEVICE N/A 08/19/2016   Procedure: Loop Recorder Insertion;  Surgeon: Will Meredith Leeds, MD;  Location: [place] [facility];  Service: Cardiovascular;  Laterality: N/A;   Social History:  reports that she quit smoking about 20 years ago. Her smoking use included cigarettes. She has never used smokeless tobacco. She reports that she does not drink alcohol and does not use drugs.  Allergies  Allergen Reactions   Duloxetine Hcl Other (See Comments)    Altered Mental Status   Influenza Vaccines Swelling    At injection site   Venlafaxine Nausea And Vomiting    Family History  Problem Relation Age of Onset   Hypertension Mother    Diabetes Mother    Pancreatic cancer Mother 84   Cancer Mother    Suicidality Father 54   Hypertension Father    Alcohol abuse Father    Hypertension Sister    Heart disease Sister    Kidney disease Sister    Varicose Veins Sister    Parkinson's disease Sister     Prior to Admission medications   Medication Sig Start Date End Date Taking? Authorizing Provider  amLODipine (NORVASC) 5 MG tablet TAKE 1 TABLET BY MOUTH EVERY DAY 01/11/21  Yes Chrismon, Vickki Muff, PA-C  apixaban (ELIQUIS) 2.5 MG TABS tablet Take 1 tablet (2.5 mg total) by mouth 2 (two) times daily. 05/12/20  Yes Chrismon, Vickki Muff, PA-C  Calcium Carb-Cholecalciferol 600-800 MG-UNIT TABS Take 1 tablet by mouth daily.    Yes [provider]  docusate sodium (COLACE) 100 MG capsule Take 200 mg by mouth at bedtime.   Yes [provider]  Fe-Succ Ac-B Cmplx-C-Ca-FA (IROSPAN 24/6) MISC Take 1 tablet by mouth daily.    Yes [provider]  ferrous sulfate 325 (65 FE) MG tablet Take 1 tablet (325 mg total) by mouth daily with breakfast. 05/27/22  Yes Sreenath, Sudheer B, MD  Fluticasone-Umeclidin-Vilant (TRELEGY ELLIPTA) 100-62.5-25 MCG/ACT AEPB Inhale 1 puff into the lungs at bedtime. 01/11/21  Yes [provider]  gabapentin (NEURONTIN) 100 MG capsule TAKE 1 CAPSULE (100MG ) BY MOUTH TWICE DAILY Patient taking differently: Take 100 mg by mouth 2 (two) times daily. TAKE 1 CAPSULE (100MG ) BY MOUTH TWICE DAILY 01/11/21  Yes Chrismon, Vickki Muff, PA-C  loratadine (CLARITIN) 10 MG tablet Take 10 mg by mouth daily.   Yes [provider]  montelukast (SINGULAIR) 10 MG tablet Take 10 mg by mouth at bedtime. 10/09/19  Yes [provider]  Multiple Vitamin (MULTIVITAMIN) tablet Take 1 tablet by mouth daily.   Yes [provider]  omeprazole (PRILOSEC) 20 MG capsule TAKE 1 CAPSULE  BY MOUTH EVERY DAY Patient taking differently: Take 20 mg by mouth 2 (two) times daily before a meal. 07/27/19  Yes Chrismon, Vickki Muff, PA-C  PARoxetine (PAXIL) 40 MG tablet Take 40 mg by mouth daily. 05/12/22 05/12/23 Yes [provider]  acetaminophen (TYLENOL) 500 MG tablet Take 1,000 mg by mouth 2 (two) times daily.     [provider]  albuterol (PROAIR HFA) 108 (90 Base) MCG/ACT inhaler INHALE TWO PUFFS INTO LUNGS EVERY 6 HOURS AS NEEDED 09/02/19   Birdie Sons, MD  ipratropium-albuterol (DUONEB) 0.5-2.5 (3) MG/3ML SOLN Take 3 mLs by nebulization every 4 (four) hours as needed. 05/26/22   Sidney Ace, MD    Physical Exam: Vitals:   06/10/22 1615 06/10/22 1630 06/10/22 1900 06/10/22 1940  BP:  (!) 162/81 (!) 158/77 (!) 157/93  Pulse: 79 82 79 87  Resp:  18  18  Temp: 98.4 F (36.9 C)   (!) 97.2 F (36.2 C)  TempSrc: Oral     SpO2: 100% 100% 99% 100%  Height:       Physical Exam Constitutional:      General: She is not in acute distress.    Appearance: She is  ill-appearing. She is not toxic-appearing or diaphoretic.  HENT:     Mouth/Throat:     Mouth: Mucous membranes are moist.  Eyes:     General: No scleral icterus. Cardiovascular:     Rate and Rhythm: Normal rate and regular rhythm.     Heart sounds: Normal heart sounds.  Pulmonary:     Breath sounds: Wheezing and rhonchi present. No rales.     Comments: Mildly increased work of breathing with pursed lip breathing. Diffuse rhonchi and wheezes. No crackles appreciated. Moderate to poor air movement throughout. Increased duration of expiratory phase Abdominal:     General: Abdomen is flat. Bowel sounds are normal.     Palpations: Abdomen is soft.  Musculoskeletal:     Right lower leg: No edema.     Left lower leg: No edema.  Skin:    General: Skin is warm and dry.     Coloration: Skin is not jaundiced.  Neurological:     Mental Status: She is alert.  Psychiatric:        Mood and Affect: Mood normal.        Thought Content: Thought content normal.       Data Reviewed: All labs and imaging data at time of hospitalist consultation was reviewed in detail.  CT scan reviewed, R bronchial mass noted.  ECG and repeat troponin pending, will review once available.   Assessment and Plan: Merida Alcantar is a 86 y.o. female with medical history significant of COPD with acute on chronic respiratory failure on 2 L of home O2, history of CVA, history of DVT on long-term anticoagulation, hypertension, CKD stage III, anxiety and depression, who presents with mopped assist and is found to have a right bronchial lung mass.  * Hemoptysis Appears to be minimal, hemoglobin unchanged from prior.  Type and screen pending.  Unless clinical picture changes do not feel she needs CBC trended at this time.  Acute on chronic respiratory failure with hypoxia (HCC) Stable on home O2 of 2 L. - Continue home albuterol and ipratropium as needed - Continue home Trelegy inhaler  Known medical  problems Hypertension-continue amlodipine Neuropathy-continue gabapentin Allergies-continue Claritin, Singulair GERD-continue PPI Anxiety/depression-continue Paxil  Mass of right lung Patient presenting with hemoptysis and right bronchial mass highly  suspicious for primary malignancy.  Not having any acute bleeding at present but high risk for significant bleeding given etiology as well as being on long-term anticoagulation.  Will need bronchoscopy for tissue diagnosis, however will need to discuss plan with hematology as far as timing given anticoagulation.  Does have postobstructive changes, no overt signs of infection at present.  On brief literature review unfortunately procalcitonin appears to be a little clinical value in this population to help distinguish presence of infection.  At present will de-escalate antibiotics and keep them on pending pulmonology consultation. - Hold Eliquis - Consult heme-onc and pulmonology in a.m. to coordinate tissue diagnosis - Type and screen ordered and pending - Empiric Treatment with ceftriaxone and azithromycin - Maintain 2 large-bore IVs  Elevated troponin Troponin minimally elevated and flat at around 50 since admission.  Repeat pending, however suspect she has a chronic troponinemia.  She is currently asymptomatic. - Follow-up EKG and repeat troponin      Advance Care Planning:   Code Status: DNR   Consults: Heme/Onc, Pulm vs Cardiothoracic Surgery  Family Communication: Daughter and son updated at bedside  Severity of Illness: The appropriate patient status for this patient is INPATIENT. Inpatient status is judged to be reasonable and necessary in order to provide the required intensity of service to ensure the patient's safety. The patient's presenting symptoms, physical exam findings, and initial radiographic and laboratory data in the context of their chronic comorbidities is felt to place them at high risk for further clinical  deterioration. Furthermore, it is not anticipated that the patient will be medically stable for discharge from the hospital within 2 midnights of admission.   * I certify that at the point of admission it is my clinical judgment that the patient will require inpatient hospital care spanning beyond 2 midnights from the point of admission due to high intensity of service, high risk for further deterioration and high frequency of surveillance required.*  Author: Clarnce Flock, MD 06/10/2022 7:51 PM  For on call review [email or web address].

## 2022-06-10 NOTE — Assessment & Plan Note (Signed)
Hypertension-continue amlodipine Neuropathy-continue gabapentin Allergies-continue Claritin, Singulair GERD-continue PPI Anxiety/depression-continue Paxil

## 2022-06-10 NOTE — Assessment & Plan Note (Signed)
Troponin minimally elevated and flat at around 50 since admission.  Repeat pending, however suspect she has a chronic troponinemia.  She is currently asymptomatic. - Follow-up EKG and repeat troponin

## 2022-06-10 NOTE — ED Triage Notes (Signed)
Pt to ED via [facility] from [facility] for hemoptysis since yesterday. Per daughter, pt was coughing up blood and tissue. Pt states that the blood was dark red. Pt had chest x ray yesterday at [facility]. Pt states that she does not think she has had any fever or chills. Pt has hx/o COPD. Pt wear 2 Liters of O2 chronically. Pt is currently in NAD.

## 2022-06-10 NOTE — ED Provider Notes (Signed)
[facility] Provider Note    Event Date/Time   First MD Initiated Contact with Patient 06/10/22 1331     (approximate)   History   Hemoptysis   HPI  Stephanie [name] is a 86 y.o. female with a history of COPD on 2 L of oxygen, CAD, hypertension, GI bleed, arthritis, and depression who presents with hemoptysis since yesterday.  The patient reports multiple episodes of blood and clumps of "tissue" in her tissues, although cannot quantify the exact amount.  She has had a persistent cough for several weeks.  She denies fever or chills, vomiting or rash, or any other abnormal bleeding or bruising.  I reviewed the past medical records.  The patient was admitted earlier this month.  Per the hospitalist discharge summary from 10/19 she presented with increased shortness of breath and hypoxia.  She was found to have lobar pneumonia and COPD exacerbation which improved with antibiotics and steroids.  She was seen by pulmonary yesterday and had a chest x-ray although I am unable to see the results in care everywhere.  The family reports that the x-ray showed improvement when compared to her admission.   Physical Exam   Triage Vital Signs: ED Triage Vitals  Enc Vitals Group     BP 06/10/22 1311 117/60     Pulse Rate 06/10/22 1311 89     Resp 06/10/22 1311 18     Temp --      Temp src --      SpO2 06/10/22 1311 94 %     Weight --      Height 06/10/22 1309 5\' 2"  (1.575 m)     Head Circumference --      Peak Flow --      Pain Score 06/10/22 1307 0     Pain Loc --      Pain Edu? --      Excl. in [place]? --     Most recent vital signs: Vitals:   06/10/22 1311  BP: 117/60  Pulse: 89  Resp: 18  SpO2: 94%     General: Awake, no distress.  CV:  Good peripheral perfusion.  Resp:  Normal effort.  Somewhat coarse breath sounds bilaterally with no rales or wheezes. Abd:  No distention.  Other:  Oropharynx clear.   ED Results / Procedures / Treatments    Labs (all labs ordered are listed, but only abnormal results are displayed) Labs Reviewed  CBC WITH DIFFERENTIAL/PLATELET - Abnormal; Notable for the following components:      Result Value   WBC 11.0 (*)    RBC 3.23 (*)    Hemoglobin 9.8 (*)    HCT 30.9 (*)    Neutro Abs 9.2 (*)    Abs Immature Granulocytes 0.14 (*)    All other components within normal limits  BASIC METABOLIC PANEL - Abnormal; Notable for the following components:   BUN 36 (*)    Creatinine, Ser 1.18 (*)    GFR, Estimated 46 (*)    All other components within normal limits  BRAIN NATRIURETIC PEPTIDE - Abnormal; Notable for the following components:   B Natriuretic Peptide 125.0 (*)    All other components within normal limits  TROPONIN I (HIGH SENSITIVITY) - Abnormal; Notable for the following components:   Troponin I (High Sensitivity) 49 (*)    All other components within normal limits  RESP PANEL BY RT-PCR (FLU A&B, COVID) ARPGX2  PROTIME-INR  TROPONIN I (HIGH SENSITIVITY)  EKG    RADIOLOGY  CT angio chest: Pending  PROCEDURES:  Critical Care performed: No  Procedures   MEDICATIONS ORDERED IN ED: Medications - No data to display   IMPRESSION / MDM / [place] / ED COURSE  I reviewed the triage vital signs and the nursing notes.  86 year old female with PMH as noted above presents with persistent cough for several weeks, now with what appears to be small-volume hemoptysis since last night with no other new symptoms.  She had an outpatient chest x-ray yesterday although I am unable to view the result.  On exam the patient is well-appearing.  Her vital signs are normal.  Differential diagnosis includes, but is not limited to, pneumonia, bronchitis, lung mass, less likely PE.  Given the patient's relatively complicated history, recent outpatient x-ray, and the presence of hemoptysis, I will obtain a CT chest to rule out PE and better tease out the presence of acute infection.  We  will obtain basic labs, troponin, BNP to rule out acute CHF, and a respiratory panel.  Patient's presentation is most consistent with acute presentation with potential threat to life or bodily function.  The patient is on the cardiac monitor to evaluate for evidence of arrhythmia and/or significant heart rate changes.   ----------------------------------------- 3:48 PM on 06/10/2022 -----------------------------------------  Lab work-up shows anemia consistent with the patient's baseline.  Troponin is minimally elevated but also consistent with prior labs.  Respiratory panel is negative.  CT angio is pending.  I signed the patient out to the oncoming ED physician Dr. Quentin Cornwall.   FINAL CLINICAL IMPRESSION(S) / ED DIAGNOSES   Final diagnoses:  Hemoptysis     Rx / DC Orders   ED Discharge Orders     None        Note:  This document was prepared using Dragon voice recognition software and may include unintentional dictation errors.    Arta Silence, MD 06/10/22 [phone]

## 2022-06-10 NOTE — Assessment & Plan Note (Signed)
Appears to be minimal, hemoglobin unchanged from prior.  Type and screen pending.  Unless clinical picture changes do not feel she needs CBC trended at this time.

## 2022-06-10 NOTE — Assessment & Plan Note (Signed)
Stable on home O2 of 2 L. - Continue home albuterol and ipratropium as needed - Continue home Trelegy inhaler

## 2022-06-10 NOTE — ED Provider Notes (Signed)
Patient received in signout from Dr. Charna Archer pending follow-up CTA.  CTA without any evidence of PE but has findings concerning for primary lung malignancy.  Given her hemoptysis on Eliquis do feel she will require admission to hospital for hemodynamic monitoring as well as treatment of postobstructive pneumonia and pulm and oncology consultation.  Discussed case in consultation with hospitalist who agrees admit patient for further work-up.   Merlyn Lot, MD 06/10/22 1859

## 2022-06-11 ENCOUNTER — Inpatient Hospital Stay: Payer: PPO

## 2022-06-11 ENCOUNTER — Encounter: Payer: Self-pay | Admitting: Family Medicine

## 2022-06-11 DIAGNOSIS — R042 Hemoptysis: Secondary | ICD-10-CM

## 2022-06-11 LAB — CBC
HCT: 28.8 % — ABNORMAL LOW (ref 36.0–46.0)
Hemoglobin: 9.2 g/dL — ABNORMAL LOW (ref 12.0–15.0)
MCH: 30.6 pg (ref 26.0–34.0)
MCHC: 31.9 g/dL (ref 30.0–36.0)
MCV: 95.7 fL (ref 80.0–100.0)
Platelets: 237 10*3/uL (ref 150–400)
RBC: 3.01 MIL/uL — ABNORMAL LOW (ref 3.87–5.11)
RDW: 15.5 % (ref 11.5–15.5)
WBC: 7.3 10*3/uL (ref 4.0–10.5)
nRBC: 0 % (ref 0.0–0.2)

## 2022-06-11 LAB — COMPREHENSIVE METABOLIC PANEL
ALT: 17 U/L (ref 0–44)
AST: 14 U/L — ABNORMAL LOW (ref 15–41)
Albumin: 2.7 g/dL — ABNORMAL LOW (ref 3.5–5.0)
Alkaline Phosphatase: 67 U/L (ref 38–126)
Anion gap: 3 — ABNORMAL LOW (ref 5–15)
BUN: 26 mg/dL — ABNORMAL HIGH (ref 8–23)
CO2: 25 mmol/L (ref 22–32)
Calcium: 8.6 mg/dL — ABNORMAL LOW (ref 8.9–10.3)
Chloride: 112 mmol/L — ABNORMAL HIGH (ref 98–111)
Creatinine, Ser: 1.17 mg/dL — ABNORMAL HIGH (ref 0.44–1.00)
GFR, Estimated: 46 mL/min — ABNORMAL LOW (ref >60)
Glucose, Bld: 82 mg/dL (ref 70–99)
Potassium: 4.2 mmol/L (ref 3.5–5.1)
Sodium: 140 mmol/L (ref 135–145)
Total Bilirubin: 0.6 mg/dL (ref 0.3–1.2)
Total Protein: 5.6 g/dL — ABNORMAL LOW (ref 6.5–8.1)

## 2022-06-11 MED ORDER — IOHEXOL 300 MG/ML  SOLN
75.0000 mL | Freq: Once | INTRAMUSCULAR | Status: AC | PRN
  Administered 2022-06-11: 75 mL via INTRAVENOUS

## 2022-06-11 MED ORDER — IPRATROPIUM-ALBUTEROL 0.5-2.5 (3) MG/3ML IN SOLN
3.0000 mL | Freq: Four times a day (QID) | RESPIRATORY_TRACT | Status: DC
  Administered 2022-06-11 – 2022-06-13 (×9): 3 mL via RESPIRATORY_TRACT
  Filled 2022-06-11 (×9): qty 3

## 2022-06-11 MED ORDER — METHYLPREDNISOLONE SODIUM SUCC 125 MG IJ SOLR
60.0000 mg | INTRAMUSCULAR | Status: DC
  Administered 2022-06-11 – 2022-06-13 (×3): 60 mg via INTRAVENOUS
  Filled 2022-06-11 (×3): qty 2

## 2022-06-11 MED ORDER — PREDNISONE 10 MG PO TABS: 5.0000 mg | ORAL_TABLET | Freq: Every day | ORAL | Status: DC

## 2022-06-11 MED ORDER — ARFORMOTEROL TARTRATE 15 MCG/2ML IN NEBU
15.0000 ug | INHALATION_SOLUTION | Freq: Two times a day (BID) | RESPIRATORY_TRACT | Status: DC
  Administered 2022-06-11 – 2022-06-13 (×5): 15 ug via RESPIRATORY_TRACT
  Filled 2022-06-11 (×6): qty 2

## 2022-06-11 MED ORDER — IOHEXOL 9 MG/ML PO SOLN
500.0000 mL | ORAL | Status: AC
  Administered 2022-06-11 (×2): 500 mL via ORAL

## 2022-06-11 MED ORDER — BUDESONIDE 0.25 MG/2ML IN SUSP
0.2500 mg | Freq: Two times a day (BID) | RESPIRATORY_TRACT | Status: DC
  Administered 2022-06-11 – 2022-06-13 (×5): 0.25 mg via RESPIRATORY_TRACT
  Filled 2022-06-11 (×5): qty 2

## 2022-06-11 MED ORDER — GADOBUTROL 1 MMOL/ML IV SOLN
5.0000 mL | Freq: Once | INTRAVENOUS | Status: AC | PRN
  Administered 2022-06-11: 5 mL via INTRAVENOUS

## 2022-06-11 NOTE — Progress Notes (Signed)
PROGRESS NOTE    Stephanie [name]  GQQ:[id number] DOB: May 31, 1936 DOA: 06/10/2022 PCP: Adin Hector, MD    Brief Narrative:  86 y.o. female with medical history significant of COPD with acute on chronic respiratory failure on 2 L of home O2, history of CVA, history of DVT on long-term anticoagulation, hypertension, CKD stage III, anxiety and depression, who presents with report of coughing blood.   Of note patient has been admitted twice in the past 3 months for COPD exacerbations and pneumonias.  During admission in August she was started on home O2, her most recent admission ended on October 19.   History obtained from patient and her daughter, with her daughter providing majority of the history.  They report that late last night she began to cough more frequently and then this morning noticed there was a significant amount of blood in her phlegm.  They report that it was not a significant amount but were unable to give a good quantification of the amount.  She reports she has not missed any medications recently.  She has been living at [facility] of late since her last admission.  She currently endorses her baseline shortness of breath but nothing additional, no chest pain, no lower extremity edema.   In the ED vital signs with mild hypertension but normal oxygenation on home 2 L of oxygen, remainder vital signs unremarkable.  CBC showed borderline leukocytosis and stable anemia with hemoglobin of 9.8, but was otherwise unremarkable as was her BMP which showed creatinine at baseline (1.1).  Troponin was checked and was initially elevated at 49, on repeat almost 4 hours later was flat at 51, this was consistent with last check approximately 3 weeks ago.  CTPA was obtained which showed endobronchial lesion in the right mainstem bronchus causing complete occlusion of the right lower lobe bronchus concerning for primary lung neoplasm, as well as other findings concerning for  postobstructive changes and metastatic disease.  She was started on vancomycin, cefepime, azithromycin.  11/4: Case discussed with pulmonology and oncology.  Oncology recommending metastatic work-up including abdominal CT and brain MRI.  If no other targets patient might warrant endobronchial ultrasound for tissue diagnosis.  Possible that if we detect presence of metastatic disease that there could be a more amenable biopsy target.   Assessment & Plan:   Principal Problem:   Hemoptysis Active Problems:   Acute on chronic respiratory failure with hypoxia (HCC)   History of stroke   Essential hypertension   DVT (deep venous thrombosis) (HCC)   Anxiety and depression   Elevated troponin   Cerebrovascular accident (CVA) due to embolism of left middle cerebral artery (HCC)   CKD (chronic kidney disease) stage 3, GFR 30-59 ml/min (HCC)   Mass of right lung   Known medical problems  * Hemoptysis Appears to be minimal, hemoglobin unchanged from prior.  Currently no indication for transfusion.  Continue holding Eliquis and monitor   Acute exacerbation of COPD Stable on home O2 of 2 L. Significantly coarse breath sounds with diminished air movement Plan: DuoNebs every 4 hours Twice daily Brovana Twice daily Pulmicort Hold home Trelegy IV Solu-Medrol 60 mg daily  Mass of right lung Patient presenting with hemoptysis and right bronchial mass highly suspicious for primary malignancy.  Not having any acute bleeding at present but high risk for significant bleeding given etiology as well as being on long-term anticoagulation.  Will need tissue diagnosis.  Possible bronchoscopy however if there is acceptable biopsy target  on abdominal imaging may be able to pursue percutaneous approach Plan: Continue holding Eliquis Oncology contacted, recommend imaging survey and outpatient follow-up for PET scan CT abdomen pelvis with contrast MRI brain with and without contrast Empiric Rocephin and  azithromycin for postobstructive pneumonia  Elevated troponin No indication of ACS.  Suspect supply demand ischemia  Known medical problems Hypertension-continue amlodipine Neuropathy-continue gabapentin Allergies-continue Claritin, Singulair GERD-continue PPI Anxiety/depression-continue Paxil    DVT prophylaxis: SCD Code Status: DNR Family Communication: Daughter at bedside 11/4 Disposition Plan: Status is: Inpatient Remains inpatient appropriate because: COPD exacerbation on IV steroids and bronchodilators.  Lung mass with possible postobstructive pneumonia.   Level of care: Med-Surg  Consultants:  Pulmonary  Procedures:  None  Antimicrobials: Rocephin Azithromycin   Subjective: Seen and examined.  Sitting up in chair.  Appears frail  Objective: Vitals:   06/10/22 1900 06/10/22 1940 06/11/22 0518 06/11/22 0811  BP: (!) 158/77 (!) 157/93 133/66 (!) 155/66  Pulse: 79 87 84 94  Resp:  18 17 18   Temp:  (!) 97.2 F (36.2 C) 98.6 F (37 C) 97.6 F (36.4 C)  TempSrc:    Oral  SpO2: 99% 100% 98% 100%  Height:        Intake/Output Summary (Last 24 hours) at 06/11/2022 1114 Last data filed at 06/11/2022 1021 Gross per 24 hour  Intake 480 ml  Output 600 ml  Net -120 ml   There were no vitals filed for this visit.  Examination:  General exam: Frail appearing Respiratory system: Markedly coarse breath sounds bilaterally.  Normal work of breathing.  2 L Cardiovascular system: S1-S2, RRR, no murmurs, no pedal edema Gastrointestinal system: Thin, soft, NT/ND, normal bowel sounds Central nervous system: Alert and oriented. No focal neurological deficits. Extremities: Symmetric 2 out of 5 power Skin: No rashes, lesions or ulcers Psychiatry: Judgement and insight appear normal. Mood & affect appropriate.     Data Reviewed: I have personally reviewed following labs and imaging studies  CBC: Recent Labs  Lab 06/10/22 1313 06/11/22 0529  WBC 11.0* 7.3   NEUTROABS 9.2*  --   HGB 9.8* 9.2*  HCT 30.9* 28.8*  MCV 95.7 95.7  PLT 252 379   Basic Metabolic Panel: Recent Labs  Lab 06/10/22 1313 06/11/22 0529  NA 139 140  K 4.3 4.2  CL 108 112*  CO2 25 25  GLUCOSE 94 82  BUN 36* 26*  CREATININE 1.18* 1.17*  CALCIUM 9.2 8.6*   GFR: CrCl cannot be calculated (Unknown ideal weight.). Liver Function Tests: Recent Labs  Lab 06/11/22 0529  AST 14*  ALT 17  ALKPHOS 67  BILITOT 0.6  PROT 5.6*  ALBUMIN 2.7*   No results for input(s): "LIPASE", "AMYLASE" in the last 168 hours. No results for input(s): "AMMONIA" in the last 168 hours. Coagulation Profile: Recent Labs  Lab 06/10/22 1436  INR 1.2   Cardiac Enzymes: No results for input(s): "CKTOTAL", "CKMB", "CKMBINDEX", "TROPONINI" in the last 168 hours. BNP (last 3 results) No results for input(s): "PROBNP" in the last 8760 hours. HbA1C: No results for input(s): "HGBA1C" in the last 72 hours. CBG: No results for input(s): "GLUCAP" in the last 168 hours. Lipid Profile: No results for input(s): "CHOL", "HDL", "LDLCALC", "TRIG", "CHOLHDL", "LDLDIRECT" in the last 72 hours. Thyroid Function Tests: No results for input(s): "TSH", "T4TOTAL", "FREET4", "T3FREE", "THYROIDAB" in the last 72 hours. Anemia Panel: No results for input(s): "VITAMINB12", "FOLATE", "FERRITIN", "TIBC", "IRON", "RETICCTPCT" in the last 72 hours. Sepsis Labs: No results  for input(s): "PROCALCITON", "LATICACIDVEN" in the last 168 hours.  Recent Results (from the past 240 hour(s))  Resp Panel by RT-PCR (Flu A&B, Covid) Anterior Nasal Swab     Status: None   Collection Time: 06/10/22  2:36 PM   Specimen: Anterior Nasal Swab  Result Value Ref Range Status   SARS Coronavirus 2 by RT PCR NEGATIVE NEGATIVE Final    Comment: (NOTE) SARS-CoV-2 target nucleic acids are NOT DETECTED.  The SARS-CoV-2 RNA is generally detectable in upper respiratory specimens during the acute phase of infection. The  lowest concentration of SARS-CoV-2 viral copies this assay can detect is 138 copies/mL. A negative result does not preclude SARS-Cov-2 infection and should not be used as the sole basis for treatment or other patient management decisions. A negative result may occur with  improper specimen collection/handling, submission of specimen other than nasopharyngeal swab, presence of viral mutation(s) within the areas targeted by this assay, and inadequate number of viral copies(<138 copies/mL). A negative result must be combined with clinical observations, patient history, and epidemiological information. The expected result is Negative.  Fact Sheet for Patients:  EntrepreneurPulse.com.au  Fact Sheet for Healthcare Providers:  IncredibleEmployment.be  This test is no t yet approved or cleared by the [place] FDA and  has been authorized for detection and/or diagnosis of SARS-CoV-2 by FDA under an Emergency Use Authorization (EUA). This EUA will remain  in effect (meaning this test can be used) for the duration of the COVID-19 declaration under Section 564(b)(1) of the Act, 21 U.S.C.section 360bbb-3(b)(1), unless the authorization is terminated  or revoked sooner.       Influenza A by PCR NEGATIVE NEGATIVE Final   Influenza B by PCR NEGATIVE NEGATIVE Final    Comment: (NOTE) The Xpert Xpress SARS-CoV-2/FLU/RSV plus assay is intended as an aid in the diagnosis of influenza from Nasopharyngeal swab specimens and should not be used as a sole basis for treatment. Nasal washings and aspirates are unacceptable for Xpert Xpress SARS-CoV-2/FLU/RSV testing.  Fact Sheet for Patients: EntrepreneurPulse.com.au  Fact Sheet for Healthcare Providers: IncredibleEmployment.be  This test is not yet approved or cleared by the [place] FDA and has been authorized for detection and/or diagnosis of SARS-CoV-2 by FDA under  an Emergency Use Authorization (EUA). This EUA will remain in effect (meaning this test can be used) for the duration of the COVID-19 declaration under Section 564(b)(1) of the Act, 21 U.S.C. section 360bbb-3(b)(1), unless the authorization is terminated or revoked.  Performed at [facility], [address] [place], [place] [address]   Culture, blood (single)     Status: None (Preliminary result)   Collection Time: 06/10/22  7:01 PM   Specimen: BLOOD  Result Value Ref Range Status   Specimen Description BLOOD BLOOD RIGHT ARM  Final   Special Requests   Final    BOTTLES DRAWN AEROBIC AND ANAEROBIC Blood Culture results may not be optimal due to an inadequate volume of blood received in culture bottles   Culture   Final    NO GROWTH < 24 HOURS Performed at [facility], [address], [place], [place] [address]    Report Status PENDING  Incomplete  MRSA Next Gen by PCR, Nasal     Status: None   Collection Time: 06/10/22  9:35 PM   Specimen: Nasal Mucosa; Nasal Swab  Result Value Ref Range Status   MRSA by PCR Next Gen NOT DETECTED NOT DETECTED Final    Comment: (NOTE) The GeneXpert MRSA  Assay (FDA approved for NASAL specimens only), is one component of a comprehensive MRSA colonization surveillance program. It is not intended to diagnose MRSA infection nor to guide or monitor treatment for MRSA infections. Test performance is not FDA approved in patients less than 84 years old. Performed at [facility], [address] [place], [place] [address]          Radiology Studies: CT Angio Chest PE W and/or Wo Contrast  Result Date: 06/10/2022 CLINICAL DATA:  Pulmonary embolus suspected; * Tracking Code: BO * EXAM: CT ANGIOGRAPHY CHEST WITH CONTRAST TECHNIQUE: Multidetector CT imaging of the chest was performed using the standard protocol during bolus administration of intravenous contrast. Multiplanar CT image reconstructions and MIPs were obtained  to evaluate the vascular anatomy. RADIATION DOSE REDUCTION: This exam was performed according to the departmental dose-optimization program which includes automated exposure control, adjustment of the mA and/or kV according to patient size and/or use of iterative reconstruction technique. CONTRAST:  31mL OMNIPAQUE IOHEXOL 350 MG/ML SOLN COMPARISON:  None Available. FINDINGS: Cardiovascular: Normal heart size. No pericardial effusion. Normal caliber thoracic aorta with severe atherosclerotic disease. Mediastinum/Nodes: Esophagus and thyroid are unremarkable. Enlarged right hilar lymph nodes. Reference right hilar lymph node measuring 1.8 cm in short axis on series 4, image 41 Lungs/Pleura: Endobronchial lesion measuring proximally 1.8 x 1.6 cm involving the right mainstem bronchus causing complete occlusion of the right lower lobe bronchus and severe narrowing of the right upper lobe and right middle lobe bronchi. Right lower lobe bronchi are filled with debris. Numerous tree-in-bud nodules and scattered irregular consolidations are seen throughout the right lower lobe. Additional scattered bilateral solid pulmonary nodules are seen. Reference perihilar nodule of the right lower lobe measuring 1.50.9 cm on series 5, image 130 reference solid nodule of the left lower lobe measuring 4 mm on series 5, image 191 Upper Abdomen: Bilateral low-attenuation renal lesions which are likely simple cysts, no specific follow-up imaging is recommended. No acute abnormalities. Musculoskeletal: No chest wall abnormality. No acute or significant osseous findings. Review of the MIP images confirms the above findings. IMPRESSION: 1. No evidence of pulmonary embolus. 2. Endobronchial lesion involving the right mainstem bronchus causing complete occlusion of the right lower lobe bronchus and severe narrowing of the right upper lobe and right middle lobe bronchi. Findings are concerning for primary lung neoplasm. Recommend further  evaluation with tissue sampling. 3. Right lower lobe bronchi are filled with debris. Numerous tree-in-bud nodules and scattered irregular consolidations are seen throughout the right lower lobe, likely postobstructive change. 4. Enlarged right hilar lymph nodes, concerning for metastatic disease. 5. Additional scattered bilateral solid pulmonary nodules are seen which may be to infection/aspiration or metastatic disease. Recommend attention on follow-up. 6. Aortic Atherosclerosis (ICD10-I70.0) and Emphysema (ICD10-J43.9). Electronically Signed   By: Yetta Glassman M.D.   On: 06/10/2022 17:19        Scheduled Meds:  amLODipine  5 mg Oral Daily   arformoterol  15 mcg Nebulization BID   budesonide (PULMICORT) nebulizer solution  0.25 mg Nebulization BID   gabapentin  100 mg Oral BID   iohexol  500 mL Oral Q1H   ipratropium-albuterol  3 mL Nebulization Q6H   loratadine  10 mg Oral Daily   methylPREDNISolone (SOLU-MEDROL) injection  60 mg Intravenous Q24H   montelukast  10 mg Oral QHS   pantoprazole  40 mg Oral Daily   PARoxetine  40 mg Oral Daily   sodium chloride flush  3 mL Intravenous Q12H  Continuous Infusions:  azithromycin     cefTRIAXone (ROCEPHIN)  IV     lactated ringers 100 mL/hr at 06/10/22 2125     LOS: 1 day      Sidney Ace, MD [facility]   If 7PM-7AM, please contact night-coverage  06/11/2022, 11:14 AM

## 2022-06-12 DIAGNOSIS — R042 Hemoptysis: Secondary | ICD-10-CM | POA: Diagnosis not present

## 2022-06-12 LAB — EXPECTORATED SPUTUM ASSESSMENT W GRAM STAIN, RFLX TO RESP C

## 2022-06-12 MED ORDER — GUAIFENESIN ER 600 MG PO TB12
600.0000 mg | ORAL_TABLET | Freq: Two times a day (BID) | ORAL | Status: DC
  Administered 2022-06-12 – 2022-06-13 (×3): 600 mg via ORAL
  Filled 2022-06-12 (×3): qty 1

## 2022-06-12 MED ORDER — ALPRAZOLAM 0.25 MG PO TABS
0.2500 mg | ORAL_TABLET | Freq: Three times a day (TID) | ORAL | Status: DC | PRN
  Administered 2022-06-12 – 2022-06-13 (×3): 0.25 mg via ORAL
  Filled 2022-06-12 (×3): qty 1

## 2022-06-12 NOTE — Consult Note (Addendum)
Hematology/Oncology Consult note [facility] Telephone:([phone] [phone]  Patient Care Team: Adin Hector, MD as PCP - General (Internal Medicine) Rockey Situ Kathlene November, MD as PCP - Cardiology (Cardiology) Erby Pian, MD as Referring Physician (Specialist) Ree Edman, MD as Consulting Physician (Dermatology) [place], [facility] [place]) Lyla Son, MD as Consulting Physician (Nephrology)   Name of the patient: Stephanie [name]  [id number]  03-12-1936    Reason for consult: Lung mass   Requesting physician: Dr. Priscella Mann  Date of visit: 06/12/2022    History of presenting illness- Patient is a 86 year old female with a past medical history significant for COPD, chronic hypoxic respiratory failure, history of CVA DVT on long-term anticoagulation and stage III CKD who presented with symptoms of worsening shortness of breath, hemoptysis and fatigue which has been ongoing for the last 3 months.  At baseline patient lives in an apartment alone but has been gradually deteriorating in her health.  She had a CT angio chest which did not show any evidence of PE but showed an endobronchial lesion measuring 1.8 x 1.6 cm involving the right mainstem bronchus causing complete occlusion of the right lower lobe bronchus and severe narrowing of the right upper lobe and right middle lobe bronchi.  Scattered irregular consolidations in the right lower lobe nonspecific versus metastatic disease.  CT abdomen showed low-density lesions in the liver which may represent metastatic disease.  MRI brain showed 3 mm nodular enhancing lesion within the medial left temporal lobe compatible with metastases.  3 additional foci of enhancement which may represent metastases versus vascular enhancement.  Hypoenhancing focus in the pituitary gland measuring 8 mm which could be a pituitary adenoma or  metastases.  I met with the patient and her 2 daughters at  the bedside.  She is currently on 2 L of oxygen but is still feeling short of breath and anxious.  Patient is an ex-smoker and quit smoking in her 36s.  ECOG PS- 3-4  Pain scale- 3   Review of systems- Review of Systems  Constitutional:  Positive for malaise/fatigue. Negative for chills, fever and weight loss.  HENT:  Negative for congestion, ear discharge and nosebleeds.   Eyes:  Negative for blurred vision.  Respiratory:  Positive for shortness of breath. Negative for cough, hemoptysis, sputum production and wheezing.   Cardiovascular:  Negative for chest pain, palpitations, orthopnea and claudication.  Gastrointestinal:  Negative for abdominal pain, blood in stool, constipation, diarrhea, heartburn, melena, nausea and vomiting.  Genitourinary:  Negative for dysuria, flank pain, frequency, hematuria and urgency.  Musculoskeletal:  Negative for back pain, joint pain and myalgias.  Skin:  Negative for rash.  Neurological:  Negative for dizziness, tingling, focal weakness, seizures, weakness and headaches.  Endo/Heme/Allergies:  Does not bruise/bleed easily.  Psychiatric/Behavioral:  Negative for depression and suicidal ideas. The patient does not have insomnia.     Allergies  Allergen Reactions   Duloxetine Hcl Other (See Comments)    Altered Mental Status   Influenza Vaccines Swelling    At injection site   Venlafaxine Nausea And Vomiting    Patient Active Problem List   Diagnosis Date Noted   Hemoptysis 06/10/2022   Mass of right lung 06/10/2022   Known medical problems 06/10/2022   Iron deficiency anemia 05/21/2022   Malnutrition of moderate degree 05/20/2022   Generalized weakness 05/20/2022   Pneumonia 05/19/2022   Lactic acidosis 05/19/2022   Acute renal failure superimposed on stage 3b chronic kidney  disease ([place]) 05/18/2022   Lobar pneumonia ([place]) 03/14/2022   Acute on chronic respiratory failure with hypoxia ([place]) 03/14/2022   CKD (chronic kidney disease) stage 3,  GFR 30-59 ml/min (HCC) 03/14/2022   CAP (community acquired pneumonia) 09/15/2017   History of stroke 09/06/2017   Essential hypertension 04/14/2017   PFO (patent foramen ovale)    DVT (deep venous thrombosis) (HCC)    Cerebrovascular accident (CVA) due to embolism of left middle cerebral artery (HCC)    Elevated troponin 08/18/2016   Mild malnutrition ([place]) 08/18/2016   Acute CVA (cerebrovascular accident) ([place]) 08/17/2016   COPD exacerbation ([place]) 07/19/2016   Depression 05/03/2016   Mild dementia ([place]) 04/19/2016   Anxiety and depression 03/04/2015   Arteriosclerosis of coronary artery 03/04/2015   CAFL (chronic airflow limitation) ([place]) 03/04/2015   Breathlessness on exertion 03/04/2015   HLD (hyperlipidemia) 03/04/2015   Osteopenia 03/04/2015   Peptic ulcer 03/04/2015   Amnesia 03/20/2014   Disordered sleep 03/20/2014   Allergy to environmental factors 11/14/2013   Gastroduodenal ulcer 11/14/2013     Past Medical History:  Diagnosis Date   Arthritis    Baker's cyst of knee, right    a. 08/2016 U/S: Baker's cyst noted in R popliteal fossa.   Complication of anesthesia    COPD (chronic obstructive pulmonary disease) ([place])    a. Home O2 started 08/2017.   Coronary artery disease    a. prior h/o stenting ~ 2005.   Depression    Edema    Expressive aphasia    GI bleed 11/14/2013   History of cardiac monitoring    a. 08/2016 s/p LINQ placement following CVA. No AFib to date (09/2017).   History of pneumonia    Hx of ischemic left MCA stroke    a. 08/2016 large posterior L MCA territory infarct in the setting of LLE DVT and suggestion of PFO on transcranial dopplers w/ bubble study (no PFO reported on echocardiogram)-->Eliquis.   Hypertension    Left leg DVT ([place])    a. 08/2016 U/S: subacute DVT in prox L popliteal vein w/ chronic superficial thrombosis in the L greater saphenous vein from the mid to prox L calf-->Eliquis.   PFO (patent foramen ovale)    a. 08/2016 Abnl TCD w/  bubble study suggesting large PFO. Not reported on echocardiogram.   PONV (postoperative nausea and vomiting)      Past Surgical History:  Procedure Laterality Date   BALLOON ANGIOPLASTY, ARTERY  05/17/2009   CATARACT EXTRACTION W/PHACO Left 05/24/2016   Procedure: CATARACT EXTRACTION PHACO AND INTRAOCULAR LENS PLACEMENT (IOC);  Surgeon: Birder Robson, MD;  Location: [facility] ORS;  Service: Ophthalmology;  Laterality: Left;  Lot# [id number] H [place]:   00:52.3 AP%:   27.3 CDE:  14.26    CATARACT EXTRACTION W/PHACO Right 06/14/2016   Procedure: CATARACT EXTRACTION PHACO AND INTRAOCULAR LENS PLACEMENT ([place]);  Surgeon: Birder Robson, MD;  Location: [facility] ORS;  Service: Ophthalmology;  Laterality: Right;  Lot# [id number] H [place]: 01:13.4 AP%: 24.4 CDE: 17.85   CORONARY ANGIOPLASTY     STENT   EP IMPLANTABLE DEVICE N/A 08/19/2016   Procedure: Loop Recorder Insertion;  Surgeon: Will Meredith Leeds, MD;  Location: [facility];  Service: Cardiovascular;  Laterality: N/A;    Social History   Socioeconomic History   Marital status: Widowed    Spouse name: Not on file   Number of children: 4   Years of education: Not on file   Highest education level: 12th grade  Occupational History  Occupation: retired  Tobacco Use   Smoking status: Former    Types: Cigarettes    Quit date: 08/07/2001    Years since quitting: 20.8   Smokeless tobacco: Never  Vaping Use   Vaping Use: Never used  Substance and Sexual Activity   Alcohol use: No    Alcohol/week: 0.0 standard drinks of alcohol   Drug use: No   Sexual activity: Not on file  Other Topics Concern   Not on file  Social History Narrative   Lives alone   Social Determinants of Health   Financial Resource Strain: Low Risk  (09/08/2020)   Overall Financial Resource Strain (CARDIA)    Difficulty of Paying Living Expenses: Not hard at all  Food Insecurity: No Food Insecurity (06/10/2022)   Hunger Vital Sign    Worried About Running Out of  Food in the Last Year: Never true    [place] in the Last Year: Never true  Transportation Needs: No Transportation Needs (06/10/2022)   PRAPARE - Hydrologist (Medical): No    Lack of Transportation (Non-Medical): No  Physical Activity: Inactive (09/08/2020)   Exercise Vital Sign    Days of Exercise per Week: 0 days    Minutes of Exercise per Session: 0 min  Stress: No Stress Concern Present (09/08/2020)   [place]    Feeling of Stress : Only a little  Social Connections: Socially Isolated (09/08/2020)   Social Connection and Isolation Panel [NHANES]    Frequency of Communication with Friends and Family: Patient refused    Frequency of Social Gatherings with Friends and Family: More than three times a week    Attends Religious Services: Never    Marine scientist or Organizations: No    Attends Archivist Meetings: Never    Marital Status: Widowed  Intimate Partner Violence: Not At Risk (06/10/2022)   Humiliation, Afraid, Rape, and Kick questionnaire    Fear of Current or Ex-Partner: No    Emotionally Abused: No    Physically Abused: No    Sexually Abused: No     Family History  Problem Relation Age of Onset   Hypertension Mother    Diabetes Mother    Pancreatic cancer Mother 71   Cancer Mother    Suicidality Father 54   Hypertension Father    Alcohol abuse Father    Hypertension Sister    Heart disease Sister    Kidney disease Sister    Varicose Veins Sister    Parkinson's disease Sister      Current Facility-Administered Medications:    acetaminophen (TYLENOL) tablet 650 mg, 650 mg, Oral, Q6H PRN **OR** acetaminophen (TYLENOL) suppository 650 mg, 650 mg, Rectal, Q6H PRN, Clarnce Flock, MD   albuterol (PROVENTIL) (2.5 MG/3ML) 0.083% nebulizer solution 2.5 mg, 2.5 mg, Nebulization, Q4H PRN, Clarnce Flock, MD   ALPRAZolam Duanne Moron) tablet 0.25 mg, 0.25  mg, Oral, TID PRN, Priscella Mann, Sudheer B, MD, 0.25 mg at 06/12/22 1639   amLODipine (NORVASC) tablet 5 mg, 5 mg, Oral, Daily, Clarnce Flock, MD, 5 mg at 06/12/22 1011   arformoterol (BROVANA) nebulizer solution 15 mcg, 15 mcg, Nebulization, BID, Sreenath, Sudheer B, MD, 15 mcg at 06/12/22 1952   azithromycin (ZITHROMAX) 500 mg in sodium chloride 0.9 % 250 mL IVPB, 500 mg, Intravenous, Q24H, Clarnce Flock, MD, Last Rate: 250 mL/hr at 06/12/22 1722, 500 mg at 06/12/22 1722  budesonide (PULMICORT) nebulizer solution 0.25 mg, 0.25 mg, Nebulization, BID, Sreenath, Sudheer B, MD, 0.25 mg at 06/12/22 1952   cefTRIAXone (ROCEPHIN) 2 g in sodium chloride 0.9 % 100 mL IVPB, 2 g, Intravenous, Q24H, Clarnce Flock, MD, Last Rate: 200 mL/hr at 06/12/22 1645, 2 g at 06/12/22 1645   gabapentin (NEURONTIN) capsule 100 mg, 100 mg, Oral, BID, Clarnce Flock, MD, 100 mg at 06/12/22 1012   guaiFENesin (MUCINEX) 12 hr tablet 600 mg, 600 mg, Oral, BID, Sreenath, Sudheer B, MD, 600 mg at 06/12/22 1011   ipratropium-albuterol (DUONEB) 0.5-2.5 (3) MG/3ML nebulizer solution 3 mL, 3 mL, Nebulization, Q6H, Sreenath, Sudheer B, MD, 3 mL at 06/12/22 1952   loratadine (CLARITIN) tablet 10 mg, 10 mg, Oral, Daily, Clarnce Flock, MD, 10 mg at 06/12/22 1011   methylPREDNISolone sodium succinate (SOLU-MEDROL) 125 mg/2 mL injection 60 mg, 60 mg, Intravenous, Q24H, Sreenath, Sudheer B, MD, 60 mg at 06/12/22 1018   metoprolol tartrate (LOPRESSOR) injection 5 mg, 5 mg, Intravenous, Q6H PRN, Clarnce Flock, MD   montelukast (SINGULAIR) tablet 10 mg, 10 mg, Oral, QHS, Clarnce Flock, MD, 10 mg at 06/11/22 2105   ondansetron (ZOFRAN) tablet 4 mg, 4 mg, Oral, Q6H PRN **OR** ondansetron (ZOFRAN) injection 4 mg, 4 mg, Intravenous, Q6H PRN, Clarnce Flock, MD   oxyCODONE (Oxy IR/ROXICODONE) immediate release tablet 5 mg, 5 mg, Oral, Q4H PRN, Clarnce Flock, MD   pantoprazole (PROTONIX) EC tablet 40 mg, 40 mg,  Oral, Daily, Clarnce Flock, MD, 40 mg at 06/12/22 1011   PARoxetine (PAXIL) tablet 40 mg, 40 mg, Oral, Daily, Clarnce Flock, MD, 40 mg at 06/12/22 1019   polyethylene glycol (MIRALAX / GLYCOLAX) packet 17 g, 17 g, Oral, Daily PRN, Clarnce Flock, MD   sodium chloride flush (NS) 0.9 % injection 3 mL, 3 mL, Intravenous, Q12H, Clarnce Flock, MD, 3 mL at 06/12/22 1018   traZODone (DESYREL) tablet 25 mg, 25 mg, Oral, QHS PRN, Clarnce Flock, MD, 25 mg at 06/11/22 2105   Physical exam:  Vitals:   06/12/22 1339 06/12/22 1640 06/12/22 1952 06/12/22 2018  BP:  98/62  92/73  Pulse:  80  86  Resp:  16  18  Temp:  98 F (36.7 C)  98 F (36.7 C)  TempSrc:  Oral    SpO2: 96% 96% 97% 98%  Height:       Physical Exam Constitutional:      General: She is in acute distress.     Comments: Appears frail and fatigued  Cardiovascular:     Rate and Rhythm: Normal rate and regular rhythm.     Heart sounds: Normal heart sounds.  Pulmonary:     Comments: Effort of breathing increased.  Breath sounds decreased bilateral lower lung bases. Abdominal:     General: Bowel sounds are normal.     Palpations: Abdomen is soft.  Skin:    General: Skin is warm and dry.  Neurological:     Mental Status: She is alert and oriented to person, place, and time.           Latest Ref Rng & Units 06/11/2022    5:29 AM  CMP  Glucose 70 - 99 mg/dL 82   BUN 8 - 23 mg/dL 26   Creatinine 0.44 - 1.00 mg/dL 1.17   Sodium 135 - 145 mmol/L 140   Potassium 3.5 - 5.1 mmol/L 4.2   Chloride 98 - 111 mmol/L  112   CO2 22 - 32 mmol/L 25   Calcium 8.9 - 10.3 mg/dL 8.6   Total Protein 6.5 - 8.1 g/dL 5.6   Total Bilirubin 0.3 - 1.2 mg/dL 0.6   Alkaline Phos 38 - 126 U/L 67   AST 15 - 41 U/L 14   ALT 0 - 44 U/L 17       Latest Ref Rng & Units 06/11/2022    5:29 AM  CBC  WBC 4.0 - 10.5 K/uL 7.3   Hemoglobin 12.0 - 15.0 g/dL 9.2   Hematocrit 36.0 - 46.0 % 28.8   Platelets 150 - 400 K/uL 237      _0 @  MR BRAIN W WO CONTRAST  Result Date: 06/11/2022 CLINICAL DATA:  Provided history: Metastatic disease evaluation. Lung cancer. Evaluate for metastases. EXAM: MRI HEAD WITHOUT AND WITH CONTRAST TECHNIQUE: Multiplanar, multiecho pulse sequences of the brain and surrounding structures were obtained without and with intravenous contrast. CONTRAST:  37m GADAVIST GADOBUTROL 1 MMOL/ML IV SOLN COMPARISON:  Head CT 09/12/2017. Brain MRI 08/18/2016. FINDINGS: Brain: Mild generalized parenchymal atrophy. 3 mm nodular enhancing lesion within the medial left temporal lobe compatible with a metastasis (series 22, image 53). 3 mm vague focus of enhancement within the high right parietal lobe which may reflect a metastasis or vascular enhancement (series 22, image 128) (series 23, image 9). Punctate focus of enhancement within the right occipital lobe which may reflect a metastasis or vascular enhancement (series 22, image 65) (series 108, image 120). Punctate focus of enhancement within the left cerebellar hemisphere which may reflect a metastasis or vascular enhancement (series 22, image 45). New from the prior brain MRI of 08/18/2016, there is hypoenhancement and fullness of the pituitary gland within the mid-to-posterior aspect of the sella turcica, measuring 8 x 8 x 7 mm (for instance as seen on series 108, image 114) (series 103, image 112). Associated rightward deviation of the pituitary stalk (series 103, image 108). Large chronic cortical/subcortical infarct within the left MCA territory and left MCA/PCA watershed territory, within the left frontal lobe, left parietal lobe, left temporal lobe, left occipital lobe and within the left insula. Associated chronic blood products at these sites. Mild T2 FLAIR hyperintense signal abnormality elsewhere within the periventricular cerebral white matter, nonspecific but compatible with mild chronic small vessel disease. Chronic lacunar infarct within the medial  right thalamus, new from the prior MRI (series 19, image 26). Prominent perivascular space within the inferior left basal ganglia. Chronic infarcts within both cerebellar hemispheres, one of which in the right cerebellar hemisphere is new from the prior MRI. There is no acute infarct. No extra-axial fluid collection. No midline shift. Vascular: Maintained flow voids within the proximal large arterial vessels. Skull and upper cervical spine: No focal suspicious marrow lesion. Sinuses/Orbits: No mass or acute finding within the imaged orbits. Prior bilateral ocular lens replacement. Minimal mucosal thickening or fluid within the right sphenoid sinus. Other: Trace fluid within the right mastoid air cells. IMPRESSION: 1. 3 mm nodular enhancing lesion within the medial left temporal lobe compatible with a metastasis. 2. Three additional foci of enhancement (measuring up to 3 mm) within the high right parietal lobe, right occipital lobe and left cerebellar hemisphere which may reflect additional metastases or vascular enhancement. 3. New from the prior brain MRI of 08/18/2016, there is hypoenhancement and fullness of the pituitary gland within the mid-to-posterior aspect of the sella turcica, measuring 8 x 8 x 7 mm. Although nonspecific, this could reflect a pituitary  adenoma or a pituitary metastasis. 4. Background parenchymal atrophy, chronic small-vessel skin disease and chronic infarcts, as detailed. Electronically Signed   By: Kellie Simmering D.O.   On: 06/11/2022 19:34   CT ABDOMEN PELVIS W CONTRAST  Result Date: 06/11/2022 CLINICAL DATA:  Newly diagnosed right lung carcinoma. Staging. * Tracking Code: BO * EXAM: CT ABDOMEN AND PELVIS WITH CONTRAST TECHNIQUE: Multidetector CT imaging of the abdomen and pelvis was performed using the standard protocol following bolus administration of intravenous contrast. RADIATION DOSE REDUCTION: This exam was performed according to the departmental dose-optimization program which  includes automated exposure control, adjustment of the mA and/or kV according to patient size and/or use of iterative reconstruction technique. CONTRAST:  2m OMNIPAQUE IOHEXOL 300 MG/ML  SOLN COMPARISON:  Chest CTA on 06/10/2022 FINDINGS: Lower Chest: Fluid-filled right lower lobe bronchi are again seen as well as right lower lobe infiltrate, without change since recent chest CTA. This is consistent with postobstructive pneumonitis. Scattered sub-cm pulmonary nodules again seen in the left lower lobe, also unchanged. Hepatobiliary: 3 small low-attenuation masses are seen in the left hepatic lobe measuring up to 2.0 cm, suspicious for hepatic metastases. Contrast noted within the gallbladder, likely from recent chest CTA, however there is no evidence of cholecystitis or biliary ductal dilatation. Pancreas:  No mass or inflammatory changes. Spleen: Within normal limits in size. Tiny benign-appearing splenic cyst noted. Adrenals/Urinary Tract: 1.5 cm fat attenuation left adrenal mass is seen, consistent with benign myelolipoma. Benign-appearing renal cysts are also seen bilaterally (no followup imaging is recommended). No suspicious renal masses identified. No evidence of ureteral calculi or hydronephrosis. Stomach/Bowel: No evidence of obstruction, inflammatory process or abnormal fluid collections. Right-sided colonic diverticulosis is noted, without evidence of diverticulitis. Vascular/Lymphatic: No pathologically enlarged lymph nodes. No acute vascular findings. Aortic atherosclerotic calcification incidentally noted. Reproductive:  No mass or other significant abnormality. Other:  None. Musculoskeletal:  No suspicious bone lesions identified. IMPRESSION: Three small low-attenuation masses in left hepatic lobe, suspicious for hepatic metastases. Consider MRI or PET-CT for further evaluation. No other sites of metastatic disease identified within the abdomen or pelvis. Stable findings of postobstructive  pneumonitis in the left lower lobe. Aortic Atherosclerosis (ICD10-I70.0). Electronically Signed   By: JMarlaine HindM.D.   On: 06/11/2022 14:32   CT Angio Chest PE W and/or Wo Contrast  Result Date: 06/10/2022 CLINICAL DATA:  Pulmonary embolus suspected; * Tracking Code: BO * EXAM: CT ANGIOGRAPHY CHEST WITH CONTRAST TECHNIQUE: Multidetector CT imaging of the chest was performed using the standard protocol during bolus administration of intravenous contrast. Multiplanar CT image reconstructions and MIPs were obtained to evaluate the vascular anatomy. RADIATION DOSE REDUCTION: This exam was performed according to the departmental dose-optimization program which includes automated exposure control, adjustment of the mA and/or kV according to patient size and/or use of iterative reconstruction technique. CONTRAST:  533mOMNIPAQUE IOHEXOL 350 MG/ML SOLN COMPARISON:  None Available. FINDINGS: Cardiovascular: Normal heart size. No pericardial effusion. Normal caliber thoracic aorta with severe atherosclerotic disease. Mediastinum/Nodes: Esophagus and thyroid are unremarkable. Enlarged right hilar lymph nodes. Reference right hilar lymph node measuring 1.8 cm in short axis on series 4, image 41 Lungs/Pleura: Endobronchial lesion measuring proximally 1.8 x 1.6 cm involving the right mainstem bronchus causing complete occlusion of the right lower lobe bronchus and severe narrowing of the right upper lobe and right middle lobe bronchi. Right lower lobe bronchi are filled with debris. Numerous tree-in-bud nodules and scattered irregular consolidations are seen throughout the right lower lobe.  Additional scattered bilateral solid pulmonary nodules are seen. Reference perihilar nodule of the right lower lobe measuring 1.50.9 cm on series 5, image 130 reference solid nodule of the left lower lobe measuring 4 mm on series 5, image 191 Upper Abdomen: Bilateral low-attenuation renal lesions which are likely simple cysts, no  specific follow-up imaging is recommended. No acute abnormalities. Musculoskeletal: No chest wall abnormality. No acute or significant osseous findings. Review of the MIP images confirms the above findings. IMPRESSION: 1. No evidence of pulmonary embolus. 2. Endobronchial lesion involving the right mainstem bronchus causing complete occlusion of the right lower lobe bronchus and severe narrowing of the right upper lobe and right middle lobe bronchi. Findings are concerning for primary lung neoplasm. Recommend further evaluation with tissue sampling. 3. Right lower lobe bronchi are filled with debris. Numerous tree-in-bud nodules and scattered irregular consolidations are seen throughout the right lower lobe, likely postobstructive change. 4. Enlarged right hilar lymph nodes, concerning for metastatic disease. 5. Additional scattered bilateral solid pulmonary nodules are seen which may be to infection/aspiration or metastatic disease. Recommend attention on follow-up. 6. Aortic Atherosclerosis (ICD10-I70.0) and Emphysema (ICD10-J43.9). Electronically Signed   By: Yetta Glassman M.D.   On: 06/10/2022 17:19   DG Chest Port 1 View  Result Date: 05/24/2022 CLINICAL DATA:  Cough EXAM: PORTABLE CHEST 1 VIEW COMPARISON:  Radiograph 05/18/2022 FINDINGS: Unchanged cardiomediastinal silhouette with loop recorder overlying the left lower chest. Interval improvement in right lower lung airspace opacities. Persistent mild reticulonodular opacities in the right lung base and left basilar subsegmental atelectasis. No pleural effusion. No evidence of pneumothorax. No acute osseous abnormality. IMPRESSION: Interval improvement in right basilar airspace disease with residual reticulonodular opacities. No new or worsening airspace disease. Electronically Signed   By: Maurine Simmering M.D.   On: 05/24/2022 15:12   DG Chest 2 View  Result Date: 05/18/2022 CLINICAL DATA:  Worsening shortness of breath EXAM: CHEST - 2 VIEW  COMPARISON:  Chest radiograph March 14, 2022 FINDINGS: No pleural effusion. No pneumothorax. Loop recorder in place. Calcific plaque in the thoracic and abdominal aorta. Slightly improved right basilar pulmonary opacity, which now predominantly appears interstitial in nature. Unchanged cardiac and mediastinal contours. No displaced rib fractures. Vertebral body heights are maintained. Degenerative changes of the bilateral AC joints. IMPRESSION: Slightly improved right basilar pulmonary opacity, which now predominantly appears interstitial in nature. This is nonspecific, but may represent scarring in the setting of chronic aspiration. Electronically Signed   By: Marin Roberts M.D.   On: 05/18/2022 15:52    Assessment and plan- Patient is a 86 y.o. female presenting with endobronchial lesion complicated by acute on chronic hypoxic respiratory failure and possible liver and brain metastases.  I have reviewed CT chest abdomen and pelvis images and MRI brain independently and discussed findings with the patient and her daughters.  Clinically she has endobronchial lesion that is causing blockage of right upper lobe middle lobe and lower lobe which is likely the cause of her worsening respiratory failure.  There are low-density lesions in the liver as well as foci of enhancement in the brain concerning for liver and brain metastases respectively.  Will likely dealing with stage IV lung cancer at this time.  Discussed with patient and family that the first step would be a biopsy and we could consider getting an ultrasound guided liver biopsy in the morning to obtain tissue diagnosis.  After tissue diagnosis is obtained we could consider giving her palliative radiation to the endobronchial lesion given  that it is causing her significant shortness of breath.  Typically we would send of NGS testing to see if she would be a candidate for any actionable mutations versus if she would need chemo plus minus  immunotherapy.  Patient's baseline performance status is poor and patient and family are concerned about her receiving any further treatments.  Patient herself does not wish to go through any biopsies or future treatments at this time.  She wishes to remain comfortable.  She is unable to go back and live alone in her apartment and there is not 24-hour care available as her 4 adult children have a full-time job.  I will have palliative care as well as hospice see the patient tomorrow.  We could be looking at [facility] with hospice services.  I doubt that she would qualify for hospice home.  Recommend as needed antianxiety medications as well as low-dose morphine to help her with her shortness of breath.     Visit Diagnosis 1. Hemoptysis                                                                               2. Lung mass 3. Brain metastases 4, Goals of care counselling/ discussion  Dr. Randa Evens, MD, MPH [facility] at [facility] [phone] 06/12/2022

## 2022-06-12 NOTE — Progress Notes (Signed)
Patient upset/emotional/crying about update in health. Daughter asked if she could get something to help calm the patient. Dr Priscella Mann notified and placed an order for xanax

## 2022-06-12 NOTE — TOC Initial Note (Signed)
Transition of Care [facility]) - Initial/Assessment Note    Patient Details  Name: Stephanie [name] MRN: [id number] Date of Birth: 03-Apr-1936  Transition of [facility]) CM/SW Contact:    Rebekah Chesterfield, LCSW Phone Number: 06/12/2022, 12:03 PM  Clinical Narrative:                 CSW met with pt's adult daughters at bedside. Pt resides at home alone; however, receives strong support from both daughters, Shauna Hugh and Butch Penny. Pt was admitted to [facility] for short-term rehab and recently discharged home. Pt is active with [facility] PT and receives a visit from [place] quarterly. Pt has a cane, shower seat, and Oxygen. Pt's PCP is Dr. Caryl Comes with [facility]. [facility] [place]     Barriers to Discharge: Continued Medical Work up   Patient Goals and CMS Choice   CMS Medicare.gov Compare Post Acute Care list provided to:: Patient Represenative (must comment) (Pt's adut daughters) Choice offered to / list presented to : Adult Children  Expected Discharge Plan and Services         Living arrangements for the past 2 months: Single Family Home                                      Prior Living Arrangements/Services Living arrangements for the past 2 months: Single Family Home Lives with:: Self Patient language and need for interpreter reviewed:: Yes Do you feel safe going back to the place where you live?: Yes      Need for Family Participation in Patient Care: Yes (Comment) Care giver support system in place?: Yes (comment) Current home services: DME, Home PT Criminal Activity/Legal Involvement Pertinent to Current Situation/Hospitalization: No - Comment as needed  Activities of Daily Living Home Assistive Devices/Equipment: Walker (specify type) ADL Screening (condition at time of admission) Patient's cognitive ability adequate to safely complete daily activities?: Yes Is the patient deaf or have difficulty hearing?: Yes Does the patient have  difficulty seeing, even when wearing glasses/contacts?: No Does the patient have difficulty concentrating, remembering, or making decisions?: Yes Patient able to express need for assistance with ADLs?: Yes Does the patient have difficulty dressing or bathing?: No Independently performs ADLs?: No Communication: Independent Dressing (OT): Needs assistance Is this a change from baseline?: Change from baseline, expected to last <3days Grooming: Independent Feeding: Independent Bathing: Needs assistance Is this a change from baseline?: Change from baseline, expected to last <3 days Toileting: Needs assistance Is this a change from baseline?: Change from baseline, expected to last <3 days In/Out Bed: Needs assistance Is this a change from baseline?: Change from baseline, expected to last <3 days Walks in Home: Independent with device (comment) Does the patient have difficulty walking or climbing stairs?: Yes Weakness of Legs: Both Weakness of Arms/Hands: None  Permission Sought/Granted Permission sought to share information with : Facility Art therapist granted to share information with : Yes, Verbal Permission Granted              Emotional Assessment         Alcohol / Substance Use: Not Applicable Psych Involvement: No (comment)  Admission diagnosis:  Hemoptysis [R04.2] Patient Active Problem List   Diagnosis Date Noted   Hemoptysis 06/10/2022   Mass of right lung 06/10/2022   Known medical problems 06/10/2022   Iron deficiency anemia 05/21/2022   Malnutrition of moderate  degree 05/20/2022   Generalized weakness 05/20/2022   Pneumonia 05/19/2022   Lactic acidosis 05/19/2022   Acute renal failure superimposed on stage 3b chronic kidney disease ([place]) 05/18/2022   Lobar pneumonia ([place]) 03/14/2022   Acute on chronic respiratory failure with hypoxia ([place]) 03/14/2022   CKD (chronic kidney disease) stage 3, GFR 30-59 ml/min (HCC) 03/14/2022   CAP  (community acquired pneumonia) 09/15/2017   History of stroke 09/06/2017   Essential hypertension 04/14/2017   PFO (patent foramen ovale)    DVT (deep venous thrombosis) ([place])    Cerebrovascular accident (CVA) due to embolism of left middle cerebral artery (HCC)    Elevated troponin 08/18/2016   Mild malnutrition ([place]) 08/18/2016   Acute CVA (cerebrovascular accident) ([place]) 08/17/2016   COPD exacerbation ([place]) 07/19/2016   Depression 05/03/2016   Mild dementia ([place]) 04/19/2016   Anxiety and depression 03/04/2015   Arteriosclerosis of coronary artery 03/04/2015   CAFL (chronic airflow limitation) ([place]) 03/04/2015   Breathlessness on exertion 03/04/2015   HLD (hyperlipidemia) 03/04/2015   Osteopenia 03/04/2015   Peptic ulcer 03/04/2015   Amnesia 03/20/2014   Disordered sleep 03/20/2014   Allergy to environmental factors 11/14/2013   Gastroduodenal ulcer 11/14/2013   PCP:  Adin Hector, MD Pharmacy:   [id number] [place] [address] [place] [id number]: [phone]: [phone]    Social Determinants of Health (SDOH) Interventions    Readmission Risk Interventions    06/12/2022   12:00 PM  Readmission Risk Prevention Plan  Transportation Screening Complete  PCP or Specialist Appt within 3-5 Days Complete  [place] or [place]  Social Work Consult for [place]/Counseling Complete  Palliative Care Screening Not Applicable  Medication Review (Press photographer Complete

## 2022-06-12 NOTE — Progress Notes (Signed)
PROGRESS NOTE    Stephanie [name]  ZOX:[id number] DOB: 1936-08-06 DOA: 06/10/2022 PCP: Adin Hector, MD    Brief Narrative:  86 y.o. female with medical history significant of COPD with acute on chronic respiratory failure on 2 L of home O2, history of CVA, history of DVT on long-term anticoagulation, hypertension, CKD stage III, anxiety and depression, who presents with report of coughing blood.   Of note patient has been admitted twice in the past 3 months for COPD exacerbations and pneumonias.  During admission in August she was started on home O2, her most recent admission ended on October 19.   History obtained from patient and her daughter, with her daughter providing majority of the history.  They report that late last night she began to cough more frequently and then this morning noticed there was a significant amount of blood in her phlegm.  They report that it was not a significant amount but were unable to give a good quantification of the amount.  She reports she has not missed any medications recently.  She has been living at [facility] of late since her last admission.  She currently endorses her baseline shortness of breath but nothing additional, no chest pain, no lower extremity edema.   In the ED vital signs with mild hypertension but normal oxygenation on home 2 L of oxygen, remainder vital signs unremarkable.  CBC showed borderline leukocytosis and stable anemia with hemoglobin of 9.8, but was otherwise unremarkable as was her BMP which showed creatinine at baseline (1.1).  Troponin was checked and was initially elevated at 49, on repeat almost 4 hours later was flat at 51, this was consistent with last check approximately 3 weeks ago.  CTPA was obtained which showed endobronchial lesion in the right mainstem bronchus causing complete occlusion of the right lower lobe bronchus concerning for primary lung neoplasm, as well as other findings concerning for  postobstructive changes and metastatic disease.  She was started on vancomycin, cefepime, azithromycin.  11/4: Case discussed with pulmonology and oncology.  Oncology recommending metastatic work-up including abdominal CT and brain MRI.  If no other targets patient might warrant endobronchial ultrasound for tissue diagnosis.  Possible that if we detect presence of metastatic disease that there could be a more amenable biopsy target.  11/5: MRI brain and CT abd with lesions suspicious for metastatic deposits.  Lengthy discussion with 2 daughters at bedside.  Dr. Janese Banks will speak with them regarding biopsy and what therapeutics may look like   Assessment & Plan:   Principal Problem:   Hemoptysis Active Problems:   Acute on chronic respiratory failure with hypoxia (HCC)   History of stroke   Essential hypertension   DVT (deep venous thrombosis) (HCC)   Anxiety and depression   Elevated troponin   Cerebrovascular accident (CVA) due to embolism of left middle cerebral artery (HCC)   CKD (chronic kidney disease) stage 3, GFR 30-59 ml/min (HCC)   Mass of right lung   Known medical problems  * Hemoptysis Appears to be minimal, hemoglobin unchanged from prior.   Currently no indication for transfusion.   Continue holding Eliquis and monitor   Acute exacerbation of COPD Stable on home O2 of 2 L. Significantly coarse breath sounds with diminished air movement Improving over interval Plan: DuoNebs every 4 hours Twice daily Brovana Twice daily Pulmicort Hold home Trelegy IV Solu-Medrol 60 mg daily, can likely start tapering tomorrow  Mass of right lung Patient presenting with hemoptysis and  right bronchial mass highly suspicious for primary malignancy.  Not having any acute bleeding at present but high risk for significant bleeding given etiology as well as being on long-term anticoagulation.  Will need tissue diagnosis.  Possible bronchoscopy however if there is acceptable biopsy target on  abdominal imaging may be able to pursue percutaneous approach - CT abd with liver lesions - MRI brain with possible metastatic deposits Plan: Continue holding Eliquis Oncology contacted, recommend imaging survey and outpatient follow-up for PET scan Dr. Janese Banks to speak with patient and family today Continue empiric Rocephin and azithromycin for postobstructive pneumonia  Elevated troponin No indication of ACS.  Suspect supply demand ischemia  Known medical problems Hypertension-continue amlodipine Neuropathy-continue gabapentin Allergies-continue Claritin, Singulair GERD-continue PPI Anxiety/depression-continue Paxil    DVT prophylaxis: SCD Code Status: DNR Family Communication: Daughter at bedside 11/4, 11/5 Disposition Plan: Status is: Inpatient Remains inpatient appropriate because: COPD exacerbation on IV steroids and bronchodilators.  Lung mass with possible postobstructive pneumonia. Possible liver biopsy, but onc to speak with patient and family first   Level of care: Med-Surg  Consultants:  Pulmonary  Procedures:  None  Antimicrobials: Rocephin Azithromycin   Subjective: Seen and examined.  Sitting up in chair.  Appears frail.  2 daughters at bedside  Objective: Vitals:   06/12/22 0229 06/12/22 0435 06/12/22 0737 06/12/22 0751  BP:  (!) 166/52 123/62   Pulse:  89 85   Resp:  16 17   Temp:  98.2 F (36.8 C) 97.7 F (36.5 C)   TempSrc:  Oral Oral   SpO2: 98% 100% 98% 96%  Height:        Intake/Output Summary (Last 24 hours) at 06/12/2022 1147 Last data filed at 06/12/2022 1020 Gross per 24 hour  Intake 710 ml  Output --  Net 710 ml   There were no vitals filed for this visit.  Examination:  General exam: NAD, frail appearing Respiratory system: Coarse breath sounds, improved from prior, 2L Cardiovascular system: S1-S2, RRR, no murmurs, no pedal edema Gastrointestinal system: Thin, soft, NT/ND, normal bowel sounds Central nervous system: Alert  and oriented. No focal neurological deficits. Extremities: Symmetric 2 out of 5 power Skin: No rashes, lesions or ulcers Psychiatry: Judgement and insight appear normal. Mood & affect appropriate.     Data Reviewed: I have personally reviewed following labs and imaging studies  CBC: Recent Labs  Lab 06/10/22 1313 06/11/22 0529  WBC 11.0* 7.3  NEUTROABS 9.2*  --   HGB 9.8* 9.2*  HCT 30.9* 28.8*  MCV 95.7 95.7  PLT 252 144   Basic Metabolic Panel: Recent Labs  Lab 06/10/22 1313 06/11/22 0529  NA 139 140  K 4.3 4.2  CL 108 112*  CO2 25 25  GLUCOSE 94 82  BUN 36* 26*  CREATININE 1.18* 1.17*  CALCIUM 9.2 8.6*   GFR: CrCl cannot be calculated (Unknown ideal weight.). Liver Function Tests: Recent Labs  Lab 06/11/22 0529  AST 14*  ALT 17  ALKPHOS 67  BILITOT 0.6  PROT 5.6*  ALBUMIN 2.7*   No results for input(s): "LIPASE", "AMYLASE" in the last 168 hours. No results for input(s): "AMMONIA" in the last 168 hours. Coagulation Profile: Recent Labs  Lab 06/10/22 1436  INR 1.2   Cardiac Enzymes: No results for input(s): "CKTOTAL", "CKMB", "CKMBINDEX", "TROPONINI" in the last 168 hours. BNP (last 3 results) No results for input(s): "PROBNP" in the last 8760 hours. HbA1C: No results for input(s): "HGBA1C" in the last 72 hours. CBG: No  results for input(s): "GLUCAP" in the last 168 hours. Lipid Profile: No results for input(s): "CHOL", "HDL", "LDLCALC", "TRIG", "CHOLHDL", "LDLDIRECT" in the last 72 hours. Thyroid Function Tests: No results for input(s): "TSH", "T4TOTAL", "FREET4", "T3FREE", "THYROIDAB" in the last 72 hours. Anemia Panel: No results for input(s): "VITAMINB12", "FOLATE", "FERRITIN", "TIBC", "IRON", "RETICCTPCT" in the last 72 hours. Sepsis Labs: No results for input(s): "PROCALCITON", "LATICACIDVEN" in the last 168 hours.  Recent Results (from the past 240 hour(s))  Expectorated Sputum Assessment w Gram Stain, Rflx to Resp Cult     Status: None    Collection Time: 06/10/22  9:11 AM   Specimen: Sputum  Result Value Ref Range Status   Specimen Description SPUTUM  Final   Special Requests NONE  Final   Sputum evaluation   Final    Sputum specimen not acceptable for testing.  Please recollect.   C/ERICA JOHNSON AT 1039 06/12/22.PMF Performed at [facility], [address] [place], [place] [address]    Report Status 06/12/2022 FINAL  Final  Resp Panel by RT-PCR (Flu A&B, Covid) Anterior Nasal Swab     Status: None   Collection Time: 06/10/22  2:36 PM   Specimen: Anterior Nasal Swab  Result Value Ref Range Status   SARS Coronavirus 2 by RT PCR NEGATIVE NEGATIVE Final    Comment: (NOTE) SARS-CoV-2 target nucleic acids are NOT DETECTED.  The SARS-CoV-2 RNA is generally detectable in upper respiratory specimens during the acute phase of infection. The lowest concentration of SARS-CoV-2 viral copies this assay can detect is 138 copies/mL. A negative result does not preclude SARS-Cov-2 infection and should not be used as the sole basis for treatment or other patient management decisions. A negative result may occur with  improper specimen collection/handling, submission of specimen other than nasopharyngeal swab, presence of viral mutation(s) within the areas targeted by this assay, and inadequate number of viral copies(<138 copies/mL). A negative result must be combined with clinical observations, patient history, and epidemiological information. The expected result is Negative.  Fact Sheet for Patients:  EntrepreneurPulse.com.au  Fact Sheet for Healthcare Providers:  IncredibleEmployment.be  This test is no t yet approved or cleared by the [place] FDA and  has been authorized for detection and/or diagnosis of SARS-CoV-2 by FDA under an Emergency Use Authorization (EUA). This EUA will remain  in effect (meaning this test can be used) for the duration of the COVID-19  declaration under Section 564(b)(1) of the Act, 21 U.S.C.section 360bbb-3(b)(1), unless the authorization is terminated  or revoked sooner.       Influenza A by PCR NEGATIVE NEGATIVE Final   Influenza B by PCR NEGATIVE NEGATIVE Final    Comment: (NOTE) The Xpert Xpress SARS-CoV-2/FLU/RSV plus assay is intended as an aid in the diagnosis of influenza from Nasopharyngeal swab specimens and should not be used as a sole basis for treatment. Nasal washings and aspirates are unacceptable for Xpert Xpress SARS-CoV-2/FLU/RSV testing.  Fact Sheet for Patients: EntrepreneurPulse.com.au  Fact Sheet for Healthcare Providers: IncredibleEmployment.be  This test is not yet approved or cleared by the [place] FDA and has been authorized for detection and/or diagnosis of SARS-CoV-2 by FDA under an Emergency Use Authorization (EUA). This EUA will remain in effect (meaning this test can be used) for the duration of the COVID-19 declaration under Section 564(b)(1) of the Act, 21 U.S.C. section 360bbb-3(b)(1), unless the authorization is terminated or revoked.  Performed at [facility], [address] [place], [place] [address]  Culture, blood (single)     Status: None (Preliminary result)   Collection Time: 06/10/22  7:01 PM   Specimen: BLOOD  Result Value Ref Range Status   Specimen Description BLOOD BLOOD RIGHT ARM  Final   Special Requests   Final    BOTTLES DRAWN AEROBIC AND ANAEROBIC Blood Culture results may not be optimal due to an inadequate volume of blood received in culture bottles   Culture   Final    NO GROWTH 2 DAYS Performed at [facility], [address] [place], [place] [address]    Report Status PENDING  Incomplete  MRSA Next Gen by PCR, Nasal     Status: None   Collection Time: 06/10/22  9:35 PM   Specimen: Nasal Mucosa; Nasal Swab  Result Value Ref Range Status   MRSA by PCR Next Gen NOT DETECTED NOT  DETECTED Final    Comment: (NOTE) The GeneXpert MRSA Assay (FDA approved for NASAL specimens only), is one component of a comprehensive MRSA colonization surveillance program. It is not intended to diagnose MRSA infection nor to guide or monitor treatment for MRSA infections. Test performance is not FDA approved in patients less than 27 years old. Performed at [facility], [address] [place], [place] [address]          Radiology Studies: MR BRAIN W WO CONTRAST  Result Date: 06/11/2022 CLINICAL DATA:  Provided history: Metastatic disease evaluation. Lung cancer. Evaluate for metastases. EXAM: MRI HEAD WITHOUT AND WITH CONTRAST TECHNIQUE: Multiplanar, multiecho pulse sequences of the brain and surrounding structures were obtained without and with intravenous contrast. CONTRAST:  66mL GADAVIST GADOBUTROL 1 MMOL/ML IV SOLN COMPARISON:  Head CT 09/12/2017. Brain MRI 08/18/2016. FINDINGS: Brain: Mild generalized parenchymal atrophy. 3 mm nodular enhancing lesion within the medial left temporal lobe compatible with a metastasis (series 22, image 53). 3 mm vague focus of enhancement within the high right parietal lobe which may reflect a metastasis or vascular enhancement (series 22, image 128) (series 23, image 9). Punctate focus of enhancement within the right occipital lobe which may reflect a metastasis or vascular enhancement (series 22, image 65) (series 108, image 120). Punctate focus of enhancement within the left cerebellar hemisphere which may reflect a metastasis or vascular enhancement (series 22, image 45). New from the prior brain MRI of 08/18/2016, there is hypoenhancement and fullness of the pituitary gland within the mid-to-posterior aspect of the sella turcica, measuring 8 x 8 x 7 mm (for instance as seen on series 108, image 114) (series 103, image 112). Associated rightward deviation of the pituitary stalk (series 103, image 108). Large chronic cortical/subcortical  infarct within the left MCA territory and left MCA/PCA watershed territory, within the left frontal lobe, left parietal lobe, left temporal lobe, left occipital lobe and within the left insula. Associated chronic blood products at these sites. Mild T2 FLAIR hyperintense signal abnormality elsewhere within the periventricular cerebral white matter, nonspecific but compatible with mild chronic small vessel disease. Chronic lacunar infarct within the medial right thalamus, new from the prior MRI (series 19, image 26). Prominent perivascular space within the inferior left basal ganglia. Chronic infarcts within both cerebellar hemispheres, one of which in the right cerebellar hemisphere is new from the prior MRI. There is no acute infarct. No extra-axial fluid collection. No midline shift. Vascular: Maintained flow voids within the proximal large arterial vessels. Skull and upper cervical spine: No focal suspicious marrow lesion. Sinuses/Orbits: No mass or acute finding within the imaged orbits.  Prior bilateral ocular lens replacement. Minimal mucosal thickening or fluid within the right sphenoid sinus. Other: Trace fluid within the right mastoid air cells. IMPRESSION: 1. 3 mm nodular enhancing lesion within the medial left temporal lobe compatible with a metastasis. 2. Three additional foci of enhancement (measuring up to 3 mm) within the high right parietal lobe, right occipital lobe and left cerebellar hemisphere which may reflect additional metastases or vascular enhancement. 3. New from the prior brain MRI of 08/18/2016, there is hypoenhancement and fullness of the pituitary gland within the mid-to-posterior aspect of the sella turcica, measuring 8 x 8 x 7 mm. Although nonspecific, this could reflect a pituitary adenoma or a pituitary metastasis. 4. Background parenchymal atrophy, chronic small-vessel skin disease and chronic infarcts, as detailed. Electronically Signed   By: Kellie Simmering D.O.   On: 06/11/2022 19:34    CT ABDOMEN PELVIS W CONTRAST  Result Date: 06/11/2022 CLINICAL DATA:  Newly diagnosed right lung carcinoma. Staging. * Tracking Code: BO * EXAM: CT ABDOMEN AND PELVIS WITH CONTRAST TECHNIQUE: Multidetector CT imaging of the abdomen and pelvis was performed using the standard protocol following bolus administration of intravenous contrast. RADIATION DOSE REDUCTION: This exam was performed according to the departmental dose-optimization program which includes automated exposure control, adjustment of the mA and/or kV according to patient size and/or use of iterative reconstruction technique. CONTRAST:  73mL OMNIPAQUE IOHEXOL 300 MG/ML  SOLN COMPARISON:  Chest CTA on 06/10/2022 FINDINGS: Lower Chest: Fluid-filled right lower lobe bronchi are again seen as well as right lower lobe infiltrate, without change since recent chest CTA. This is consistent with postobstructive pneumonitis. Scattered sub-cm pulmonary nodules again seen in the left lower lobe, also unchanged. Hepatobiliary: 3 small low-attenuation masses are seen in the left hepatic lobe measuring up to 2.0 cm, suspicious for hepatic metastases. Contrast noted within the gallbladder, likely from recent chest CTA, however there is no evidence of cholecystitis or biliary ductal dilatation. Pancreas:  No mass or inflammatory changes. Spleen: Within normal limits in size. Tiny benign-appearing splenic cyst noted. Adrenals/Urinary Tract: 1.5 cm fat attenuation left adrenal mass is seen, consistent with benign myelolipoma. Benign-appearing renal cysts are also seen bilaterally (no followup imaging is recommended). No suspicious renal masses identified. No evidence of ureteral calculi or hydronephrosis. Stomach/Bowel: No evidence of obstruction, inflammatory process or abnormal fluid collections. Right-sided colonic diverticulosis is noted, without evidence of diverticulitis. Vascular/Lymphatic: No pathologically enlarged lymph nodes. No acute vascular findings.  Aortic atherosclerotic calcification incidentally noted. Reproductive:  No mass or other significant abnormality. Other:  None. Musculoskeletal:  No suspicious bone lesions identified. IMPRESSION: Three small low-attenuation masses in left hepatic lobe, suspicious for hepatic metastases. Consider MRI or PET-CT for further evaluation. No other sites of metastatic disease identified within the abdomen or pelvis. Stable findings of postobstructive pneumonitis in the left lower lobe. Aortic Atherosclerosis (ICD10-I70.0). Electronically Signed   By: Marlaine Hind M.D.   On: 06/11/2022 14:32   CT Angio Chest PE W and/or Wo Contrast  Result Date: 06/10/2022 CLINICAL DATA:  Pulmonary embolus suspected; * Tracking Code: BO * EXAM: CT ANGIOGRAPHY CHEST WITH CONTRAST TECHNIQUE: Multidetector CT imaging of the chest was performed using the standard protocol during bolus administration of intravenous contrast. Multiplanar CT image reconstructions and MIPs were obtained to evaluate the vascular anatomy. RADIATION DOSE REDUCTION: This exam was performed according to the departmental dose-optimization program which includes automated exposure control, adjustment of the mA and/or kV according to patient size and/or use of iterative reconstruction technique.  CONTRAST:  38mL OMNIPAQUE IOHEXOL 350 MG/ML SOLN COMPARISON:  None Available. FINDINGS: Cardiovascular: Normal heart size. No pericardial effusion. Normal caliber thoracic aorta with severe atherosclerotic disease. Mediastinum/Nodes: Esophagus and thyroid are unremarkable. Enlarged right hilar lymph nodes. Reference right hilar lymph node measuring 1.8 cm in short axis on series 4, image 41 Lungs/Pleura: Endobronchial lesion measuring proximally 1.8 x 1.6 cm involving the right mainstem bronchus causing complete occlusion of the right lower lobe bronchus and severe narrowing of the right upper lobe and right middle lobe bronchi. Right lower lobe bronchi are filled with debris.  Numerous tree-in-bud nodules and scattered irregular consolidations are seen throughout the right lower lobe. Additional scattered bilateral solid pulmonary nodules are seen. Reference perihilar nodule of the right lower lobe measuring 1.50.9 cm on series 5, image 130 reference solid nodule of the left lower lobe measuring 4 mm on series 5, image 191 Upper Abdomen: Bilateral low-attenuation renal lesions which are likely simple cysts, no specific follow-up imaging is recommended. No acute abnormalities. Musculoskeletal: No chest wall abnormality. No acute or significant osseous findings. Review of the MIP images confirms the above findings. IMPRESSION: 1. No evidence of pulmonary embolus. 2. Endobronchial lesion involving the right mainstem bronchus causing complete occlusion of the right lower lobe bronchus and severe narrowing of the right upper lobe and right middle lobe bronchi. Findings are concerning for primary lung neoplasm. Recommend further evaluation with tissue sampling. 3. Right lower lobe bronchi are filled with debris. Numerous tree-in-bud nodules and scattered irregular consolidations are seen throughout the right lower lobe, likely postobstructive change. 4. Enlarged right hilar lymph nodes, concerning for metastatic disease. 5. Additional scattered bilateral solid pulmonary nodules are seen which may be to infection/aspiration or metastatic disease. Recommend attention on follow-up. 6. Aortic Atherosclerosis (ICD10-I70.0) and Emphysema (ICD10-J43.9). Electronically Signed   By: Yetta Glassman M.D.   On: 06/10/2022 17:19        Scheduled Meds:  amLODipine  5 mg Oral Daily   arformoterol  15 mcg Nebulization BID   budesonide (PULMICORT) nebulizer solution  0.25 mg Nebulization BID   gabapentin  100 mg Oral BID   guaiFENesin  600 mg Oral BID   ipratropium-albuterol  3 mL Nebulization Q6H   loratadine  10 mg Oral Daily   methylPREDNISolone (SOLU-MEDROL) injection  60 mg Intravenous Q24H    montelukast  10 mg Oral QHS   pantoprazole  40 mg Oral Daily   PARoxetine  40 mg Oral Daily   sodium chloride flush  3 mL Intravenous Q12H   Continuous Infusions:  azithromycin 500 mg (06/11/22 1806)   cefTRIAXone (ROCEPHIN)  IV 2 g (06/11/22 1715)     LOS: 2 days      Sidney Ace, MD [facility]   If 7PM-7AM, please contact night-coverage  06/12/2022, 11:47 AM

## 2022-06-13 DIAGNOSIS — Z515 Encounter for palliative care: Secondary | ICD-10-CM | POA: Diagnosis not present

## 2022-06-13 DIAGNOSIS — R042 Hemoptysis: Secondary | ICD-10-CM | POA: Diagnosis not present

## 2022-06-13 MED ORDER — MORPHINE SULFATE (PF) 2 MG/ML IV SOLN: 2.0000 mg | INTRAVENOUS | Status: DC | PRN

## 2022-06-13 MED ORDER — METHYLPREDNISOLONE SODIUM SUCC 40 MG IJ SOLR: 40.0000 mg | INTRAMUSCULAR | Status: DC

## 2022-06-13 MED ORDER — GUAIFENESIN ER 600 MG PO TB12: 600.0000 mg | ORAL_TABLET | Freq: Two times a day (BID) | ORAL | Status: AC

## 2022-06-13 NOTE — Discharge Summary (Signed)
Physician Discharge Summary  Stephanie [name] NIO:[id number] DOB: [birth year] DOA: 06/10/2022  PCP: Adin Hector, MD  Admit date: 06/10/2022 Discharge date: 06/13/2022  Admitted From: Long term care Disposition:  Inpatient hospice  Recommendations for Outpatient Follow-up:  None recommended   Home Health:No  Equipment/Devices:Oxygen 2L [place]   Discharge Condition:Hospice CODE STATUS:DNR  Diet recommendation: Comfort/reg  Brief/Interim Summary: 86 y.o. female with medical history significant of COPD with acute on chronic respiratory failure on 2 L of home O2, history of CVA, history of DVT on long-term anticoagulation, hypertension, CKD stage III, anxiety and depression, who presents with report of coughing blood.   Of note patient has been admitted twice in the past 3 months for COPD exacerbations and pneumonias.  During admission in August she was started on home O2, her most recent admission ended on October 19.   History obtained from patient and her daughter, with her daughter providing majority of the history.  They report that late last night she began to cough more frequently and then this morning noticed there was a significant amount of blood in her phlegm.  They report that it was not a significant amount but were unable to give a good quantification of the amount.  She reports she has not missed any medications recently.  She has been living at [facility] of late since her last admission.  She currently endorses her baseline shortness of breath but nothing additional, no chest pain, no lower extremity edema.   In the ED vital signs with mild hypertension but normal oxygenation on home 2 L of oxygen, remainder vital signs unremarkable.  CBC showed borderline leukocytosis and stable anemia with hemoglobin of 9.8, but was otherwise unremarkable as was her BMP which showed creatinine at baseline (1.1).  Troponin was checked and was initially elevated at 49, on repeat almost  4 hours later was flat at 51, this was consistent with last check approximately 3 weeks ago.  CTPA was obtained which showed endobronchial lesion in the right mainstem bronchus causing complete occlusion of the right lower lobe bronchus concerning for primary lung neoplasm, as well as other findings concerning for postobstructive changes and metastatic disease.  She was started on vancomycin, cefepime, azithromycin.   11/4: Case discussed with pulmonology and oncology.  Oncology recommending metastatic work-up including abdominal CT and brain MRI.  If no other targets patient might warrant endobronchial ultrasound for tissue diagnosis.  Possible that if we detect presence of metastatic disease that there could be a more amenable biopsy target.   11/5: MRI brain and CT abd with lesions suspicious for metastatic deposits.  Lengthy discussion with 2 daughters at bedside.  Dr. Janese Banks will speak with them regarding biopsy and what therapeutics may look like   11/6: After discussion with the family, decision made to defer biopsy as patient does not wish to undergo any treatment at this time.  Elects to proceed with hospice referral.  Palliative care NP [name] and hospice liaison engaged.  Hospice liaison informs that patient has been accepted to Hospice home.  Patient may discharge once consents are signed.   Discharge Diagnoses:  Principal Problem:   Hemoptysis Active Problems:   Acute on chronic respiratory failure with hypoxia (HCC)   History of stroke   Essential hypertension   DVT (deep venous thrombosis) (HCC)   Anxiety and depression   Elevated troponin   Cerebrovascular accident (CVA) due to embolism of left middle cerebral artery (HCC)   CKD (chronic kidney  disease) stage 3, GFR 30-59 ml/min (HCC)   Mass of right lung   Known medical problems  * Hemoptysis Appears to be minimal, hemoglobin unchanged from prior.   Currently no indication for transfusion.   Hold Eliquis on dc    Acute exacerbation of COPD Stable on home O2 of 2 L. Significantly coarse breath sounds with diminished air movement Improving over interval Plan: Steroids discontinued Defer to hospice physician for respiratory care post dc   Mass of right lung Patient presenting with hemoptysis and right bronchial mass highly suspicious for primary malignancy.  Not having any acute bleeding at present but high risk for significant bleeding given etiology as well as being on long-term anticoagulation.  Will need tissue diagnosis.  Possible bronchoscopy however if there is acceptable biopsy target on abdominal imaging may be able to pursue percutaneous approach - CT abd with liver lesions - MRI brain with possible metastatic deposits Plan: After discussion with oncology family made decision not to proceed with biopsy.  Palliative care engaged, hospice liaison engaged.  Patient accepted to hospice home.  Consents signed.  DNR.  Discharge Instructions  Discharge Instructions     Diet - low sodium heart healthy   Complete by: As directed    Increase activity slowly   Complete by: As directed       Allergies as of 06/13/2022       Reactions   Duloxetine Hcl Other (See Comments)   Altered Mental Status   Influenza Vaccines Swelling   At injection site   Venlafaxine Nausea And Vomiting        Medication List     STOP taking these medications    amLODipine 5 MG tablet Commonly known as: NORVASC   apixaban 2.5 MG Tabs tablet Commonly known as: Eliquis   Calcium Carb-Cholecalciferol 600-800 MG-UNIT Tabs   docusate sodium 100 MG capsule Commonly known as: COLACE   ferrous sulfate 325 (65 FE) MG tablet   Irospan 24/6 Misc   loratadine 10 MG tablet Commonly known as: CLARITIN   montelukast 10 MG tablet Commonly known as: SINGULAIR   multivitamin tablet   omeprazole 20 MG capsule Commonly known as: PRILOSEC   Trelegy Ellipta 100-62.5-25 MCG/ACT Aepb Generic drug:  Fluticasone-Umeclidin-Vilant       TAKE these medications    acetaminophen 500 MG tablet Commonly known as: TYLENOL Take 1,000 mg by mouth 2 (two) times daily.   albuterol 108 (90 Base) MCG/ACT inhaler Commonly known as: ProAir HFA INHALE TWO PUFFS INTO LUNGS EVERY 6 HOURS AS NEEDED   gabapentin 100 MG capsule Commonly known as: NEURONTIN TAKE 1 CAPSULE (100MG ) BY MOUTH TWICE DAILY What changed: See the new instructions.   guaiFENesin 600 MG 12 hr tablet Commonly known as: MUCINEX Take 1 tablet (600 mg total) by mouth 2 (two) times daily.   ipratropium-albuterol 0.5-2.5 (3) MG/3ML Soln Commonly known as: DUONEB Take 3 mLs by nebulization every 4 (four) hours as needed.   PARoxetine 40 MG tablet Commonly known as: PAXIL Take 40 mg by mouth daily.        Allergies  Allergen Reactions   Duloxetine Hcl Other (See Comments)    Altered Mental Status   Influenza Vaccines Swelling    At injection site   Venlafaxine Nausea And Vomiting    Consultations: Oncology Palliative care   Procedures/Studies: MR BRAIN W WO CONTRAST  Result Date: 06/11/2022 CLINICAL DATA:  Provided history: Metastatic disease evaluation. Lung cancer. Evaluate for metastases. EXAM: MRI HEAD WITHOUT  AND WITH CONTRAST TECHNIQUE: Multiplanar, multiecho pulse sequences of the brain and surrounding structures were obtained without and with intravenous contrast. CONTRAST:  31mL GADAVIST GADOBUTROL 1 MMOL/ML IV SOLN COMPARISON:  Head CT 09/12/2017. Brain MRI 08/18/2016. FINDINGS: Brain: Mild generalized parenchymal atrophy. 3 mm nodular enhancing lesion within the medial left temporal lobe compatible with a metastasis (series 22, image 53). 3 mm vague focus of enhancement within the high right parietal lobe which may reflect a metastasis or vascular enhancement (series 22, image 128) (series 23, image 9). Punctate focus of enhancement within the right occipital lobe which may reflect a metastasis or vascular  enhancement (series 22, image 65) (series 108, image 120). Punctate focus of enhancement within the left cerebellar hemisphere which may reflect a metastasis or vascular enhancement (series 22, image 45). New from the prior brain MRI of 08/18/2016, there is hypoenhancement and fullness of the pituitary gland within the mid-to-posterior aspect of the sella turcica, measuring 8 x 8 x 7 mm (for instance as seen on series 108, image 114) (series 103, image 112). Associated rightward deviation of the pituitary stalk (series 103, image 108). Large chronic cortical/subcortical infarct within the left MCA territory and left MCA/PCA watershed territory, within the left frontal lobe, left parietal lobe, left temporal lobe, left occipital lobe and within the left insula. Associated chronic blood products at these sites. Mild T2 FLAIR hyperintense signal abnormality elsewhere within the periventricular cerebral white matter, nonspecific but compatible with mild chronic small vessel disease. Chronic lacunar infarct within the medial right thalamus, new from the prior MRI (series 19, image 26). Prominent perivascular space within the inferior left basal ganglia. Chronic infarcts within both cerebellar hemispheres, one of which in the right cerebellar hemisphere is new from the prior MRI. There is no acute infarct. No extra-axial fluid collection. No midline shift. Vascular: Maintained flow voids within the proximal large arterial vessels. Skull and upper cervical spine: No focal suspicious marrow lesion. Sinuses/Orbits: No mass or acute finding within the imaged orbits. Prior bilateral ocular lens replacement. Minimal mucosal thickening or fluid within the right sphenoid sinus. Other: Trace fluid within the right mastoid air cells. IMPRESSION: 1. 3 mm nodular enhancing lesion within the medial left temporal lobe compatible with a metastasis. 2. Three additional foci of enhancement (measuring up to 3 mm) within the high right  parietal lobe, right occipital lobe and left cerebellar hemisphere which may reflect additional metastases or vascular enhancement. 3. New from the prior brain MRI of 08/18/2016, there is hypoenhancement and fullness of the pituitary gland within the mid-to-posterior aspect of the sella turcica, measuring 8 x 8 x 7 mm. Although nonspecific, this could reflect a pituitary adenoma or a pituitary metastasis. 4. Background parenchymal atrophy, chronic small-vessel skin disease and chronic infarcts, as detailed. Electronically Signed   By: Kellie Simmering D.O.   On: 06/11/2022 19:34   CT ABDOMEN PELVIS W CONTRAST  Result Date: 06/11/2022 CLINICAL DATA:  Newly diagnosed right lung carcinoma. Staging. * Tracking Code: BO * EXAM: CT ABDOMEN AND PELVIS WITH CONTRAST TECHNIQUE: Multidetector CT imaging of the abdomen and pelvis was performed using the standard protocol following bolus administration of intravenous contrast. RADIATION DOSE REDUCTION: This exam was performed according to the departmental dose-optimization program which includes automated exposure control, adjustment of the mA and/or kV according to patient size and/or use of iterative reconstruction technique. CONTRAST:  72mL OMNIPAQUE IOHEXOL 300 MG/ML  SOLN COMPARISON:  Chest CTA on 06/10/2022 FINDINGS: Lower Chest: Fluid-filled right lower lobe bronchi  are again seen as well as right lower lobe infiltrate, without change since recent chest CTA. This is consistent with postobstructive pneumonitis. Scattered sub-cm pulmonary nodules again seen in the left lower lobe, also unchanged. Hepatobiliary: 3 small low-attenuation masses are seen in the left hepatic lobe measuring up to 2.0 cm, suspicious for hepatic metastases. Contrast noted within the gallbladder, likely from recent chest CTA, however there is no evidence of cholecystitis or biliary ductal dilatation. Pancreas:  No mass or inflammatory changes. Spleen: Within normal limits in size. Tiny  benign-appearing splenic cyst noted. Adrenals/Urinary Tract: 1.5 cm fat attenuation left adrenal mass is seen, consistent with benign myelolipoma. Benign-appearing renal cysts are also seen bilaterally (no followup imaging is recommended). No suspicious renal masses identified. No evidence of ureteral calculi or hydronephrosis. Stomach/Bowel: No evidence of obstruction, inflammatory process or abnormal fluid collections. Right-sided colonic diverticulosis is noted, without evidence of diverticulitis. Vascular/Lymphatic: No pathologically enlarged lymph nodes. No acute vascular findings. Aortic atherosclerotic calcification incidentally noted. Reproductive:  No mass or other significant abnormality. Other:  None. Musculoskeletal:  No suspicious bone lesions identified. IMPRESSION: Three small low-attenuation masses in left hepatic lobe, suspicious for hepatic metastases. Consider MRI or PET-CT for further evaluation. No other sites of metastatic disease identified within the abdomen or pelvis. Stable findings of postobstructive pneumonitis in the left lower lobe. Aortic Atherosclerosis (ICD10-I70.0). Electronically Signed   By: Marlaine Hind M.D.   On: 06/11/2022 14:32   CT Angio Chest PE W and/or Wo Contrast  Result Date: 06/10/2022 CLINICAL DATA:  Pulmonary embolus suspected; * Tracking Code: BO * EXAM: CT ANGIOGRAPHY CHEST WITH CONTRAST TECHNIQUE: Multidetector CT imaging of the chest was performed using the standard protocol during bolus administration of intravenous contrast. Multiplanar CT image reconstructions and MIPs were obtained to evaluate the vascular anatomy. RADIATION DOSE REDUCTION: This exam was performed according to the departmental dose-optimization program which includes automated exposure control, adjustment of the mA and/or kV according to patient size and/or use of iterative reconstruction technique. CONTRAST:  88mL OMNIPAQUE IOHEXOL 350 MG/ML SOLN COMPARISON:  None Available. FINDINGS:  Cardiovascular: Normal heart size. No pericardial effusion. Normal caliber thoracic aorta with severe atherosclerotic disease. Mediastinum/Nodes: Esophagus and thyroid are unremarkable. Enlarged right hilar lymph nodes. Reference right hilar lymph node measuring 1.8 cm in short axis on series 4, image 41 Lungs/Pleura: Endobronchial lesion measuring proximally 1.8 x 1.6 cm involving the right mainstem bronchus causing complete occlusion of the right lower lobe bronchus and severe narrowing of the right upper lobe and right middle lobe bronchi. Right lower lobe bronchi are filled with debris. Numerous tree-in-bud nodules and scattered irregular consolidations are seen throughout the right lower lobe. Additional scattered bilateral solid pulmonary nodules are seen. Reference perihilar nodule of the right lower lobe measuring 1.50.9 cm on series 5, image 130 reference solid nodule of the left lower lobe measuring 4 mm on series 5, image 191 Upper Abdomen: Bilateral low-attenuation renal lesions which are likely simple cysts, no specific follow-up imaging is recommended. No acute abnormalities. Musculoskeletal: No chest wall abnormality. No acute or significant osseous findings. Review of the MIP images confirms the above findings. IMPRESSION: 1. No evidence of pulmonary embolus. 2. Endobronchial lesion involving the right mainstem bronchus causing complete occlusion of the right lower lobe bronchus and severe narrowing of the right upper lobe and right middle lobe bronchi. Findings are concerning for primary lung neoplasm. Recommend further evaluation with tissue sampling. 3. Right lower lobe bronchi are filled with debris. Numerous tree-in-bud nodules  and scattered irregular consolidations are seen throughout the right lower lobe, likely postobstructive change. 4. Enlarged right hilar lymph nodes, concerning for metastatic disease. 5. Additional scattered bilateral solid pulmonary nodules are seen which may be to  infection/aspiration or metastatic disease. Recommend attention on follow-up. 6. Aortic Atherosclerosis (ICD10-I70.0) and Emphysema (ICD10-J43.9). Electronically Signed   By: Yetta Glassman M.D.   On: 06/10/2022 17:19   DG Chest Port 1 View  Result Date: 05/24/2022 CLINICAL DATA:  Cough EXAM: PORTABLE CHEST 1 VIEW COMPARISON:  Radiograph 05/18/2022 FINDINGS: Unchanged cardiomediastinal silhouette with loop recorder overlying the left lower chest. Interval improvement in right lower lung airspace opacities. Persistent mild reticulonodular opacities in the right lung base and left basilar subsegmental atelectasis. No pleural effusion. No evidence of pneumothorax. No acute osseous abnormality. IMPRESSION: Interval improvement in right basilar airspace disease with residual reticulonodular opacities. No new or worsening airspace disease. Electronically Signed   By: Maurine Simmering M.D.   On: 05/24/2022 15:12   DG Chest 2 View  Result Date: 05/18/2022 CLINICAL DATA:  Worsening shortness of breath EXAM: CHEST - 2 VIEW COMPARISON:  Chest radiograph March 14, 2022 FINDINGS: No pleural effusion. No pneumothorax. Loop recorder in place. Calcific plaque in the thoracic and abdominal aorta. Slightly improved right basilar pulmonary opacity, which now predominantly appears interstitial in nature. Unchanged cardiac and mediastinal contours. No displaced rib fractures. Vertebral body heights are maintained. Degenerative changes of the bilateral AC joints. IMPRESSION: Slightly improved right basilar pulmonary opacity, which now predominantly appears interstitial in nature. This is nonspecific, but may represent scarring in the setting of chronic aspiration. Electronically Signed   By: Marin Roberts M.D.   On: 05/18/2022 15:52      Subjective: Seen and examined on day of DC.  Appropriate for disposition to inpatient hospice  Discharge Exam: Vitals:   06/13/22 0418 06/13/22 0813  BP: 138/68 (!) 119/56  Pulse: 90 91   Resp: 18 18  Temp: 98.1 F (36.7 C) 97.9 F (36.6 C)  SpO2: 99% 96%   Vitals:   06/12/22 2018 06/13/22 0200 06/13/22 0418 06/13/22 0813  BP: 92/73  138/68 (!) 119/56  Pulse: 86  90 91  Resp: 18  18 18   Temp: 98 F (36.7 C)  98.1 F (36.7 C) 97.9 F (36.6 C)  TempSrc: Oral   Oral  SpO2: 98% 99% 99% 96%  Height:        General: SOB, Appears fatigued and deconditioned Cardiovascular: RRR, S1/S2 +, no rubs, no gallops Respiratory: Coarse crackles bl Abdominal: Soft, NT, ND, bowel sounds + Extremities: no edema, no cyanosis    The results of significant diagnostics from this hospitalization (including imaging, microbiology, ancillary and laboratory) are listed below for reference.     Microbiology: Recent Results (from the past 240 hour(s))  Expectorated Sputum Assessment w Gram Stain, Rflx to Resp Cult     Status: None   Collection Time: 06/10/22  9:11 AM   Specimen: Sputum  Result Value Ref Range Status   Specimen Description SPUTUM  Final   Special Requests NONE  Final   Sputum evaluation   Final    Sputum specimen not acceptable for testing.  Please recollect.   C/ERICA JOHNSON AT 1039 06/12/22.PMF Performed at [facility], [address] [place], [place] [address]    Report Status 06/12/2022 FINAL  Final  Resp Panel by RT-PCR (Flu A&B, Covid) Anterior Nasal Swab     Status: None   Collection Time: 06/10/22  2:36  PM   Specimen: Anterior Nasal Swab  Result Value Ref Range Status   SARS Coronavirus 2 by RT PCR NEGATIVE NEGATIVE Final    Comment: (NOTE) SARS-CoV-2 target nucleic acids are NOT DETECTED.  The SARS-CoV-2 RNA is generally detectable in upper respiratory specimens during the acute phase of infection. The lowest concentration of SARS-CoV-2 viral copies this assay can detect is 138 copies/mL. A negative result does not preclude SARS-Cov-2 infection and should not be used as the sole basis for treatment or other patient management decisions.  A negative result may occur with  improper specimen collection/handling, submission of specimen other than nasopharyngeal swab, presence of viral mutation(s) within the areas targeted by this assay, and inadequate number of viral copies(<138 copies/mL). A negative result must be combined with clinical observations, patient history, and epidemiological information. The expected result is Negative.  Fact Sheet for Patients:  EntrepreneurPulse.com.au  Fact Sheet for Healthcare Providers:  IncredibleEmployment.be  This test is no t yet approved or cleared by the [place] FDA and  has been authorized for detection and/or diagnosis of SARS-CoV-2 by FDA under an Emergency Use Authorization (EUA). This EUA will remain  in effect (meaning this test can be used) for the duration of the COVID-19 declaration under Section 564(b)(1) of the Act, 21 U.S.C.section 360bbb-3(b)(1), unless the authorization is terminated  or revoked sooner.       Influenza A by PCR NEGATIVE NEGATIVE Final   Influenza B by PCR NEGATIVE NEGATIVE Final    Comment: (NOTE) The Xpert Xpress SARS-CoV-2/FLU/RSV plus assay is intended as an aid in the diagnosis of influenza from Nasopharyngeal swab specimens and should not be used as a sole basis for treatment. Nasal washings and aspirates are unacceptable for Xpert Xpress SARS-CoV-2/FLU/RSV testing.  Fact Sheet for Patients: EntrepreneurPulse.com.au  Fact Sheet for Healthcare Providers: IncredibleEmployment.be  This test is not yet approved or cleared by the [place] FDA and has been authorized for detection and/or diagnosis of SARS-CoV-2 by FDA under an Emergency Use Authorization (EUA). This EUA will remain in effect (meaning this test can be used) for the duration of the COVID-19 declaration under Section 564(b)(1) of the Act, 21 U.S.C. section 360bbb-3(b)(1), unless the authorization  is terminated or revoked.  Performed at [facility], [address] [place], [place] [address]   Culture, blood (single)     Status: None (Preliminary result)   Collection Time: 06/10/22  7:01 PM   Specimen: BLOOD  Result Value Ref Range Status   Specimen Description BLOOD BLOOD RIGHT ARM  Final   Special Requests   Final    BOTTLES DRAWN AEROBIC AND ANAEROBIC Blood Culture results may not be optimal due to an inadequate volume of blood received in culture bottles   Culture   Final    NO GROWTH 2 DAYS Performed at [facility], [address], [place], [place] [address]    Report Status PENDING  Incomplete  MRSA Next Gen by PCR, Nasal     Status: None   Collection Time: 06/10/22  9:35 PM   Specimen: Nasal Mucosa; Nasal Swab  Result Value Ref Range Status   MRSA by PCR Next Gen NOT DETECTED NOT DETECTED Final    Comment: (NOTE) The GeneXpert MRSA Assay (FDA approved for NASAL specimens only), is one component of a comprehensive MRSA colonization surveillance program. It is not intended to diagnose MRSA infection nor to guide or monitor treatment for MRSA infections. Test performance is not FDA approved in patients  less than 86 years old. Performed at [facility], [address] [place], [place] [address]      Labs: BNP (last 3 results) Recent Labs    06/10/22 1433  BNP 440.1*   Basic Metabolic Panel: Recent Labs  Lab 06/10/22 1313 06/11/22 0529  NA 139 140  K 4.3 4.2  CL 108 112*  CO2 25 25  GLUCOSE 94 82  BUN 36* 26*  CREATININE 1.18* 1.17*  CALCIUM 9.2 8.6*   Liver Function Tests: Recent Labs  Lab 06/11/22 0529  AST 14*  ALT 17  ALKPHOS 67  BILITOT 0.6  PROT 5.6*  ALBUMIN 2.7*   No results for input(s): "LIPASE", "AMYLASE" in the last 168 hours. No results for input(s): "AMMONIA" in the last 168 hours. CBC: Recent Labs  Lab 06/10/22 1313 06/11/22 0529  WBC 11.0* 7.3  NEUTROABS 9.2*  --   HGB 9.8* 9.2*  HCT  30.9* 28.8*  MCV 95.7 95.7  PLT 252 237   Cardiac Enzymes: No results for input(s): "CKTOTAL", "CKMB", "CKMBINDEX", "TROPONINI" in the last 168 hours. BNP: Invalid input(s): "POCBNP" CBG: No results for input(s): "GLUCAP" in the last 168 hours. D-Dimer No results for input(s): "DDIMER" in the last 72 hours. Hgb A1c No results for input(s): "HGBA1C" in the last 72 hours. Lipid Profile No results for input(s): "CHOL", "HDL", "LDLCALC", "TRIG", "CHOLHDL", "LDLDIRECT" in the last 72 hours. Thyroid function studies No results for input(s): "TSH", "T4TOTAL", "T3FREE", "THYROIDAB" in the last 72 hours.  Invalid input(s): "FREET3" Anemia work up No results for input(s): "VITAMINB12", "FOLATE", "FERRITIN", "TIBC", "IRON", "RETICCTPCT" in the last 72 hours. Urinalysis    Component Value Date/Time   COLORURINE YELLOW (A) 09/12/2017 1659   APPEARANCEUR HAZY (A) 09/12/2017 1659   APPEARANCEUR Clear 12/15/2016 1654   LABSPEC 1.011 09/12/2017 1659   PHURINE 5.0 09/12/2017 1659   GLUCOSEU NEGATIVE 09/12/2017 1659   HGBUR SMALL (A) 09/12/2017 1659   BILIRUBINUR NEGATIVE 09/12/2017 1659   BILIRUBINUR Negative 12/15/2016 1654   KETONESUR NEGATIVE 09/12/2017 1659   PROTEINUR NEGATIVE 09/12/2017 1659   NITRITE NEGATIVE 09/12/2017 1659   LEUKOCYTESUR NEGATIVE 09/12/2017 1659   LEUKOCYTESUR Negative 12/15/2016 1654   Sepsis Labs Recent Labs  Lab 06/10/22 1313 06/11/22 0529  WBC 11.0* 7.3   Microbiology Recent Results (from the past 240 hour(s))  Expectorated Sputum Assessment w Gram Stain, Rflx to Resp Cult     Status: None   Collection Time: 06/10/22  9:11 AM   Specimen: Sputum  Result Value Ref Range Status   Specimen Description SPUTUM  Final   Special Requests NONE  Final   Sputum evaluation   Final    Sputum specimen not acceptable for testing.  Please recollect.   C/ERICA JOHNSON AT 1039 06/12/22.PMF Performed at [facility], [address] [place], [place]  [address]    Report Status 06/12/2022 FINAL  Final  Resp Panel by RT-PCR (Flu A&B, Covid) Anterior Nasal Swab     Status: None   Collection Time: 06/10/22  2:36 PM   Specimen: Anterior Nasal Swab  Result Value Ref Range Status   SARS Coronavirus 2 by RT PCR NEGATIVE NEGATIVE Final    Comment: (NOTE) SARS-CoV-2 target nucleic acids are NOT DETECTED.  The SARS-CoV-2 RNA is generally detectable in upper respiratory specimens during the acute phase of infection. The lowest concentration of SARS-CoV-2 viral copies this assay can detect is 138 copies/mL. A negative result does not preclude SARS-Cov-2 infection and should not be  used as the sole basis for treatment or other patient management decisions. A negative result may occur with  improper specimen collection/handling, submission of specimen other than nasopharyngeal swab, presence of viral mutation(s) within the areas targeted by this assay, and inadequate number of viral copies(<138 copies/mL). A negative result must be combined with clinical observations, patient history, and epidemiological information. The expected result is Negative.  Fact Sheet for Patients:  EntrepreneurPulse.com.au  Fact Sheet for Healthcare Providers:  IncredibleEmployment.be  This test is no t yet approved or cleared by the [place] FDA and  has been authorized for detection and/or diagnosis of SARS-CoV-2 by FDA under an Emergency Use Authorization (EUA). This EUA will remain  in effect (meaning this test can be used) for the duration of the COVID-19 declaration under Section 564(b)(1) of the Act, 21 U.S.C.section 360bbb-3(b)(1), unless the authorization is terminated  or revoked sooner.       Influenza A by PCR NEGATIVE NEGATIVE Final   Influenza B by PCR NEGATIVE NEGATIVE Final    Comment: (NOTE) The Xpert Xpress SARS-CoV-2/FLU/RSV plus assay is intended as an aid in the diagnosis of influenza from  Nasopharyngeal swab specimens and should not be used as a sole basis for treatment. Nasal washings and aspirates are unacceptable for Xpert Xpress SARS-CoV-2/FLU/RSV testing.  Fact Sheet for Patients: EntrepreneurPulse.com.au  Fact Sheet for Healthcare Providers: IncredibleEmployment.be  This test is not yet approved or cleared by the [place] FDA and has been authorized for detection and/or diagnosis of SARS-CoV-2 by FDA under an Emergency Use Authorization (EUA). This EUA will remain in effect (meaning this test can be used) for the duration of the COVID-19 declaration under Section 564(b)(1) of the Act, 21 U.S.C. section 360bbb-3(b)(1), unless the authorization is terminated or revoked.  Performed at [facility], [address] [place], [place] [address]   Culture, blood (single)     Status: None (Preliminary result)   Collection Time: 06/10/22  7:01 PM   Specimen: BLOOD  Result Value Ref Range Status   Specimen Description BLOOD BLOOD RIGHT ARM  Final   Special Requests   Final    BOTTLES DRAWN AEROBIC AND ANAEROBIC Blood Culture results may not be optimal due to an inadequate volume of blood received in culture bottles   Culture   Final    NO GROWTH 2 DAYS Performed at [facility], [address] [place], [place] [address]    Report Status PENDING  Incomplete  MRSA Next Gen by PCR, Nasal     Status: None   Collection Time: 06/10/22  9:35 PM   Specimen: Nasal Mucosa; Nasal Swab  Result Value Ref Range Status   MRSA by PCR Next Gen NOT DETECTED NOT DETECTED Final    Comment: (NOTE) The GeneXpert MRSA Assay (FDA approved for NASAL specimens only), is one component of a comprehensive MRSA colonization surveillance program. It is not intended to diagnose MRSA infection nor to guide or monitor treatment for MRSA infections. Test performance is not FDA approved in patients less than 47 years old. Performed at  [facility], [address] [place], [place] [address]      Time coordinating discharge: Over 30 minutes  SIGNED:   Sidney Ace, MD  [facility] 06/13/2022, 3:11 PM Pager   If 7PM-7AM, please contact night-coverage

## 2022-06-13 NOTE — Progress Notes (Signed)
[facility]) Hospital Liaison Referral Note   Received request from [place]  for patient/ family interest in [facility].  Spoke with patient/ family and confirmed interest and explain services.   Eligibility confirmed and family agreeable to transfer today.    TOC and MD made aware.    RN please call report to [phone] prior to patient leaving the unit.    Please send completed and signed DNR with patient at time of discharge.    Thank you   Kenna Gilbert BSN RN  [facility] Liaison  [phone]

## 2022-06-13 NOTE — Plan of Care (Signed)
IV remained in pace per hospice request.  Patient transported via EMS, family present.

## 2022-06-13 NOTE — Consult Note (Signed)
Chief Complaint: Patient was seen in consultation today for liver mass at the request of Randa Evens, MD  Referring Physician(s): Randa Evens, MD  Supervising Physician: Michaelle Birks  Patient Status: [facility] - In-pt  History of Present Illness: Stephanie [name] is a 86 y.o. female with a past medical history significant for COPD, chronic hypoxic respiratory failure, history of CVA DVT on long-term anticoagulation and stage III CKD who presented with symptoms of worsening shortness of breath, hemoptysis and fatigue which has been ongoing for the last 3 months.  She is a former smoker, quit in her 72s She had a CT angio chest which showed an endobronchial lesion measuring 1.8 x 1.6 cm involving the right mainstem bronchus and scattered irregular consolidations in the right lower lobe nonspecific versus metastatic disease.   CT abdomen showed low-density lesions in the liver which may represent metastatic disease.   IR consulted for liver biopsy to further delineate.    Past Medical History:  Diagnosis Date   Arthritis    Baker's cyst of knee, right    a. 08/2016 U/S: Baker's cyst noted in R popliteal fossa.   Complication of anesthesia    COPD (chronic obstructive pulmonary disease) ([place])    a. Home O2 started 08/2017.   Coronary artery disease    a. prior h/o stenting ~ 2005.   Depression    Edema    Expressive aphasia    GI bleed 11/14/2013   History of cardiac monitoring    a. 08/2016 s/p LINQ placement following CVA. No AFib to date (09/2017).   History of pneumonia    Hx of ischemic left MCA stroke    a. 08/2016 large posterior L MCA territory infarct in the setting of LLE DVT and suggestion of PFO on transcranial dopplers w/ bubble study (no PFO reported on echocardiogram)-->Eliquis.   Hypertension    Left leg DVT ([place])    a. 08/2016 U/S: subacute DVT in prox L popliteal vein w/ chronic superficial thrombosis in the L greater saphenous vein from the mid to prox L  calf-->Eliquis.   PFO (patent foramen ovale)    a. 08/2016 Abnl TCD w/ bubble study suggesting large PFO. Not reported on echocardiogram.   PONV (postoperative nausea and vomiting)     Past Surgical History:  Procedure Laterality Date   BALLOON ANGIOPLASTY, ARTERY  05/17/2009   CATARACT EXTRACTION W/PHACO Left 05/24/2016   Procedure: CATARACT EXTRACTION PHACO AND INTRAOCULAR LENS PLACEMENT (IOC);  Surgeon: Birder Robson, MD;  Location: [facility] ORS;  Service: Ophthalmology;  Laterality: Left;  Lot# [id number] H [place]:   00:52.3 AP%:   27.3 CDE:  14.26    CATARACT EXTRACTION W/PHACO Right 06/14/2016   Procedure: CATARACT EXTRACTION PHACO AND INTRAOCULAR LENS PLACEMENT ([place]);  Surgeon: Birder Robson, MD;  Location: [facility] OR[facility];  Service: Ophthalmology;  Laterality: Right;  Lot# [id number] H [place]: 01:13.4 AP%: 24.4 CDE: 17.85   CORONARY ANGIOPLASTY     STENT   EP IMPLANTABLE DEVICE N/A 08/19/2016   Procedure: Loop Recorder Insertion;  Surgeon: Will Meredith Leeds, MD;  Location: [place] [facility];  Service: Cardiovascular;  Laterality: N/A;    Allergies: Duloxetine hcl, Influenza vaccines, and Venlafaxine  Medications: Prior to Admission medications   Medication Sig Start Date End Date Taking? Authorizing Provider  amLODipine (NORVASC) 5 MG tablet TAKE 1 TABLET BY MOUTH EVERY DAY 01/11/21  Yes Chrismon, Vickki Muff, PA-C  apixaban (ELIQUIS) 2.5 MG TABS tablet Take 1 tablet (2.5 mg total) by mouth 2 (two) times  daily. 05/12/20  Yes Chrismon, Vickki Muff, PA-C  Calcium Carb-Cholecalciferol 600-800 MG-UNIT TABS Take 1 tablet by mouth daily.    Yes [provider]  docusate sodium (COLACE) 100 MG capsule Take 200 mg by mouth at bedtime.   Yes [provider]  Fe-Succ Ac-B Cmplx-C-Ca-FA (IROSPAN 24/6) MISC Take 1 tablet by mouth daily.   Yes [provider]  ferrous sulfate 325 (65 FE) MG tablet Take 1 tablet (325 mg total) by mouth daily with breakfast. 05/27/22  Yes Sreenath, Sudheer  B, MD  Fluticasone-Umeclidin-Vilant (TRELEGY ELLIPTA) 100-62.5-25 MCG/ACT AEPB Inhale 1 puff into the lungs at bedtime. 01/11/21  Yes [provider]  gabapentin (NEURONTIN) 100 MG capsule TAKE 1 CAPSULE (100MG ) BY MOUTH TWICE DAILY Patient taking differently: Take 100 mg by mouth 2 (two) times daily. TAKE 1 CAPSULE (100MG ) BY MOUTH TWICE DAILY 01/11/21  Yes Chrismon, Vickki Muff, PA-C  loratadine (CLARITIN) 10 MG tablet Take 10 mg by mouth daily.   Yes [provider]  montelukast (SINGULAIR) 10 MG tablet Take 10 mg by mouth at bedtime. 10/09/19  Yes [provider]  Multiple Vitamin (MULTIVITAMIN) tablet Take 1 tablet by mouth daily.   Yes [provider]  omeprazole (PRILOSEC) 20 MG capsule TAKE 1 CAPSULE BY MOUTH EVERY DAY Patient taking differently: Take 20 mg by mouth 2 (two) times daily before a meal. 07/27/19  Yes Chrismon, Vickki Muff, PA-C  PARoxetine (PAXIL) 40 MG tablet Take 40 mg by mouth daily. 05/12/22 05/12/23 Yes [provider]  acetaminophen (TYLENOL) 500 MG tablet Take 1,000 mg by mouth 2 (two) times daily.     [provider]  albuterol (PROAIR HFA) 108 (90 Base) MCG/ACT inhaler INHALE TWO PUFFS INTO LUNGS EVERY 6 HOURS AS NEEDED 09/02/19   Birdie Sons, MD  ipratropium-albuterol (DUONEB) 0.5-2.5 (3) MG/3ML SOLN Take 3 mLs by nebulization every 4 (four) hours as needed. 05/26/22   Sidney Ace, MD     Family History  Problem Relation Age of Onset   Hypertension Mother    Diabetes Mother    Pancreatic cancer Mother 82   Cancer Mother    Suicidality Father 62   Hypertension Father    Alcohol abuse Father    Hypertension Sister    Heart disease Sister    Kidney disease Sister    Varicose Veins Sister    Parkinson's disease Sister     Social History   Socioeconomic History   Marital status: Widowed    Spouse name: Not on file   Number of children: 4   Years of education: Not on file   Highest education level:  12th grade  Occupational History   Occupation: retired  Tobacco Use   Smoking status: Former    Types: Cigarettes    Quit date: 08/07/2001    Years since quitting: 20.8   Smokeless tobacco: Never  Vaping Use   Vaping Use: Never used  Substance and Sexual Activity   Alcohol use: No    Alcohol/week: 0.0 standard drinks of alcohol   Drug use: No   Sexual activity: Not on file  Other Topics Concern   Not on file  Social History Narrative   Lives alone   Social Determinants of Health   Financial Resource Strain: [place]  (09/08/2020)   Overall Financial Resource Strain (CARDIA)    Difficulty of Paying Living Expenses: Not hard at all  Food Insecurity: No Food Insecurity (06/10/2022)   Hunger Vital Sign  Worried About Charity fundraiser in the Last Year: Never true    [place] in the Last Year: Never true  Transportation Needs: No Transportation Needs (06/10/2022)   PRAPARE - Hydrologist (Medical): No    Lack of Transportation (Non-Medical): No  Physical Activity: Inactive (09/08/2020)   Exercise Vital Sign    Days of Exercise per Week: 0 days    Minutes of Exercise per Session: 0 min  Stress: No Stress Concern Present (09/08/2020)   [place]    Feeling of Stress : Only a little  Social Connections: Socially Isolated (09/08/2020)   Social Connection and Isolation Panel [NHANES]    Frequency of Communication with Friends and Family: Patient refused    Frequency of Social Gatherings with Friends and Family: More than three times a week    Attends Religious Services: Never    Marine scientist or Organizations: No    Attends Archivist Meetings: Never    Marital Status: Widowed    Review of Systems: not obtained  Physical exam: deferred  Vital Signs: BP (!) 119/56 (BP Location: Right Arm)   Pulse 91   Temp 97.9 F (36.6 C) (Oral)   Resp 18   Ht 5\' 2"   (1.575 m)   SpO2 96%   BMI 21.40 kg/m   Imaging: MR BRAIN W WO CONTRAST  Result Date: 06/11/2022 CLINICAL DATA:  Provided history: Metastatic disease evaluation. Lung cancer. Evaluate for metastases. EXAM: MRI HEAD WITHOUT AND WITH CONTRAST TECHNIQUE: Multiplanar, multiecho pulse sequences of the brain and surrounding structures were obtained without and with intravenous contrast. CONTRAST:  41mL GADAVIST GADOBUTROL 1 MMOL/ML IV SOLN COMPARISON:  Head CT 09/12/2017. Brain MRI 08/18/2016. FINDINGS: Brain: Mild generalized parenchymal atrophy. 3 mm nodular enhancing lesion within the medial left temporal lobe compatible with a metastasis (series 22, image 53). 3 mm vague focus of enhancement within the high right parietal lobe which may reflect a metastasis or vascular enhancement (series 22, image 128) (series 23, image 9). Punctate focus of enhancement within the right occipital lobe which may reflect a metastasis or vascular enhancement (series 22, image 65) (series 108, image 120). Punctate focus of enhancement within the left cerebellar hemisphere which may reflect a metastasis or vascular enhancement (series 22, image 45). New from the prior brain MRI of 08/18/2016, there is hypoenhancement and fullness of the pituitary gland within the mid-to-posterior aspect of the sella turcica, measuring 8 x 8 x 7 mm (for instance as seen on series 108, image 114) (series 103, image 112). Associated rightward deviation of the pituitary stalk (series 103, image 108). Large chronic cortical/subcortical infarct within the left MCA territory and left MCA/PCA watershed territory, within the left frontal lobe, left parietal lobe, left temporal lobe, left occipital lobe and within the left insula. Associated chronic blood products at these sites. Mild T2 FLAIR hyperintense signal abnormality elsewhere within the periventricular cerebral white matter, nonspecific but compatible with mild chronic small vessel disease. Chronic  lacunar infarct within the medial right thalamus, new from the prior MRI (series 19, image 26). Prominent perivascular space within the inferior left basal ganglia. Chronic infarcts within both cerebellar hemispheres, one of which in the right cerebellar hemisphere is new from the prior MRI. There is no acute infarct. No extra-axial fluid collection. No midline shift. Vascular: Maintained flow voids within the proximal large arterial vessels. Skull and upper cervical spine: No  focal suspicious marrow lesion. Sinuses/Orbits: No mass or acute finding within the imaged orbits. Prior bilateral ocular lens replacement. Minimal mucosal thickening or fluid within the right sphenoid sinus. Other: Trace fluid within the right mastoid air cells. IMPRESSION: 1. 3 mm nodular enhancing lesion within the medial left temporal lobe compatible with a metastasis. 2. Three additional foci of enhancement (measuring up to 3 mm) within the high right parietal lobe, right occipital lobe and left cerebellar hemisphere which may reflect additional metastases or vascular enhancement. 3. New from the prior brain MRI of 08/18/2016, there is hypoenhancement and fullness of the pituitary gland within the mid-to-posterior aspect of the sella turcica, measuring 8 x 8 x 7 mm. Although nonspecific, this could reflect a pituitary adenoma or a pituitary metastasis. 4. Background parenchymal atrophy, chronic small-vessel skin disease and chronic infarcts, as detailed. Electronically Signed   By: Kellie Simmering D.O.   On: 06/11/2022 19:34   CT ABDOMEN PELVIS W CONTRAST  Result Date: 06/11/2022 CLINICAL DATA:  Newly diagnosed right lung carcinoma. Staging. * Tracking Code: BO * EXAM: CT ABDOMEN AND PELVIS WITH CONTRAST TECHNIQUE: Multidetector CT imaging of the abdomen and pelvis was performed using the standard protocol following bolus administration of intravenous contrast. RADIATION DOSE REDUCTION: This exam was performed according to the  departmental dose-optimization program which includes automated exposure control, adjustment of the mA and/or kV according to patient size and/or use of iterative reconstruction technique. CONTRAST:  32mL OMNIPAQUE IOHEXOL 300 MG/ML  SOLN COMPARISON:  Chest CTA on 06/10/2022 FINDINGS: Lower Chest: Fluid-filled right lower lobe bronchi are again seen as well as right lower lobe infiltrate, without change since recent chest CTA. This is consistent with postobstructive pneumonitis. Scattered sub-cm pulmonary nodules again seen in the left lower lobe, also unchanged. Hepatobiliary: 3 small low-attenuation masses are seen in the left hepatic lobe measuring up to 2.0 cm, suspicious for hepatic metastases. Contrast noted within the gallbladder, likely from recent chest CTA, however there is no evidence of cholecystitis or biliary ductal dilatation. Pancreas:  No mass or inflammatory changes. Spleen: Within normal limits in size. Tiny benign-appearing splenic cyst noted. Adrenals/Urinary Tract: 1.5 cm fat attenuation left adrenal mass is seen, consistent with benign myelolipoma. Benign-appearing renal cysts are also seen bilaterally (no followup imaging is recommended). No suspicious renal masses identified. No evidence of ureteral calculi or hydronephrosis. Stomach/Bowel: No evidence of obstruction, inflammatory process or abnormal fluid collections. Right-sided colonic diverticulosis is noted, without evidence of diverticulitis. Vascular/Lymphatic: No pathologically enlarged lymph nodes. No acute vascular findings. Aortic atherosclerotic calcification incidentally noted. Reproductive:  No mass or other significant abnormality. Other:  None. Musculoskeletal:  No suspicious bone lesions identified. IMPRESSION: Three small low-attenuation masses in left hepatic lobe, suspicious for hepatic metastases. Consider MRI or PET-CT for further evaluation. No other sites of metastatic disease identified within the abdomen or pelvis.  Stable findings of postobstructive pneumonitis in the left lower lobe. Aortic Atherosclerosis (ICD10-I70.0). Electronically Signed   By: Marlaine Hind M.D.   On: 06/11/2022 14:32   CT Angio Chest PE W and/or Wo Contrast  Result Date: 06/10/2022 CLINICAL DATA:  Pulmonary embolus suspected; * Tracking Code: BO * EXAM: CT ANGIOGRAPHY CHEST WITH CONTRAST TECHNIQUE: Multidetector CT imaging of the chest was performed using the standard protocol during bolus administration of intravenous contrast. Multiplanar CT image reconstructions and MIPs were obtained to evaluate the vascular anatomy. RADIATION DOSE REDUCTION: This exam was performed according to the departmental dose-optimization program which includes automated exposure control, adjustment of  the mA and/or kV according to patient size and/or use of iterative reconstruction technique. CONTRAST:  57mL OMNIPAQUE IOHEXOL 350 MG/ML SOLN COMPARISON:  None Available. FINDINGS: Cardiovascular: Normal heart size. No pericardial effusion. Normal caliber thoracic aorta with severe atherosclerotic disease. Mediastinum/Nodes: Esophagus and thyroid are unremarkable. Enlarged right hilar lymph nodes. Reference right hilar lymph node measuring 1.8 cm in short axis on series 4, image 41 Lungs/Pleura: Endobronchial lesion measuring proximally 1.8 x 1.6 cm involving the right mainstem bronchus causing complete occlusion of the right lower lobe bronchus and severe narrowing of the right upper lobe and right middle lobe bronchi. Right lower lobe bronchi are filled with debris. Numerous tree-in-bud nodules and scattered irregular consolidations are seen throughout the right lower lobe. Additional scattered bilateral solid pulmonary nodules are seen. Reference perihilar nodule of the right lower lobe measuring 1.50.9 cm on series 5, image 130 reference solid nodule of the left lower lobe measuring 4 mm on series 5, image 191 Upper Abdomen: Bilateral low-attenuation renal lesions which  are likely simple cysts, no specific follow-up imaging is recommended. No acute abnormalities. Musculoskeletal: No chest wall abnormality. No acute or significant osseous findings. Review of the MIP images confirms the above findings. IMPRESSION: 1. No evidence of pulmonary embolus. 2. Endobronchial lesion involving the right mainstem bronchus causing complete occlusion of the right lower lobe bronchus and severe narrowing of the right upper lobe and right middle lobe bronchi. Findings are concerning for primary lung neoplasm. Recommend further evaluation with tissue sampling. 3. Right lower lobe bronchi are filled with debris. Numerous tree-in-bud nodules and scattered irregular consolidations are seen throughout the right lower lobe, likely postobstructive change. 4. Enlarged right hilar lymph nodes, concerning for metastatic disease. 5. Additional scattered bilateral solid pulmonary nodules are seen which may be to infection/aspiration or metastatic disease. Recommend attention on follow-up. 6. Aortic Atherosclerosis (ICD10-I70.0) and Emphysema (ICD10-J43.9). Electronically Signed   By: Yetta Glassman M.D.   On: 06/10/2022 17:19   DG Chest Port 1 View  Result Date: 05/24/2022 CLINICAL DATA:  Cough EXAM: PORTABLE CHEST 1 VIEW COMPARISON:  Radiograph 05/18/2022 FINDINGS: Unchanged cardiomediastinal silhouette with loop recorder overlying the left lower chest. Interval improvement in right lower lung airspace opacities. Persistent mild reticulonodular opacities in the right lung base and left basilar subsegmental atelectasis. No pleural effusion. No evidence of pneumothorax. No acute osseous abnormality. IMPRESSION: Interval improvement in right basilar airspace disease with residual reticulonodular opacities. No new or worsening airspace disease. Electronically Signed   By: Maurine Simmering M.D.   On: 05/24/2022 15:12   DG Chest 2 View  Result Date: 05/18/2022 CLINICAL DATA:  Worsening shortness of breath  EXAM: CHEST - 2 VIEW COMPARISON:  Chest radiograph March 14, 2022 FINDINGS: No pleural effusion. No pneumothorax. Loop recorder in place. Calcific plaque in the thoracic and abdominal aorta. Slightly improved right basilar pulmonary opacity, which now predominantly appears interstitial in nature. Unchanged cardiac and mediastinal contours. No displaced rib fractures. Vertebral body heights are maintained. Degenerative changes of the bilateral AC joints. IMPRESSION: Slightly improved right basilar pulmonary opacity, which now predominantly appears interstitial in nature. This is nonspecific, but may represent scarring in the setting of chronic aspiration. Electronically Signed   By: Marin Roberts M.D.   On: 05/18/2022 15:52    Labs:  CBC: Recent Labs    05/23/22 0500 05/25/22 0442 06/10/22 1313 06/11/22 0529  WBC 9.5 12.1* 11.0* 7.3  HGB 10.1* 9.8* 9.8* 9.2*  HCT 32.3* 30.0* 30.9* 28.8*  PLT 273  263 252 237    COAGS: Recent Labs    06/10/22 1436  INR 1.2    BMP: Recent Labs    05/23/22 0500 05/25/22 0442 06/10/22 1313 06/11/22 0529  NA 147* 146* 139 140  K 3.9 3.7 4.3 4.2  CL 115* 113* 108 112*  CO2 25 25 25 25   GLUCOSE 96 92 94 82  BUN 39* 48* 36* 26*  CALCIUM 9.0 9.1 9.2 8.6*  CREATININE 1.30* 1.19* 1.18* 1.17*  GFRNONAA 41* 45* 46* 46*    LIVER FUNCTION TESTS: Recent Labs    03/14/22 1242 06/11/22 0529  BILITOT 0.5 0.6  AST 15 14*  ALT 14 17  ALKPHOS 80 67  PROT 6.7 5.6*  ALBUMIN 3.2* 2.7*     Assessment and Plan:  86 year old female with findings consistent with metastatic cancer, likely lung in origin.  Case reviewed by Dr. Maryelizabeth Kaufmann who approves US guided liver biopsy.  Patient not NPO today.  No coags available to review bleeding risk.  Informed by team patient does not wish to proceed with biopsy at this time.  IR to remain available as needed.  Thank you for this interesting consult.     Electronically Signed: Pasty Spillers, PA 06/13/2022, 9:31  AM   I spent a total of 20 Minutes in  clinical consultation for liver mass.

## 2022-06-13 NOTE — Consult Note (Signed)
[place] [facility] [phone] [phone]   Name: Stephanie [name] Date: 06/13/2022 MRN: [id number]  DOB: Dec 08, 1935  Patient Care Team: Adin Hector, MD as PCP - General (Internal Medicine) Minna Merritts, MD as PCP - Cardiology (Cardiology) Erby Pian, MD as Referring Physician (Specialist) Ree Edman, MD as Consulting Physician (Dermatology) [facility], [facility]) Lyla Son, MD as Consulting Physician (Nephrology)    REASON FOR CONSULTATION: Stephanie [name] is a 86 y.o. female with multiple medical problems including COPD on 2 L O2, history of CVA, CAD, history of GI bleed.  Patient was hospitalized 05/19/2019 through 05/26/2022 with acute on chronic respiratory failure and was treated for pneumonia and COPD exacerbation.  She was discharged to SNF but readmitted on 06/10/2022 again with acute on chronic respiratory failure and hemoptysis.  CTA of chest revealed endobronchial lesion causing occlusion to the right mainstem bronchus and postobstructive changes.  CT of the abdomen and pelvis revealed 3 small liver masses concerning for metastatic disease.  MRI of the brain also revealed several small areas concerning for metastatic disease.  Palliative care was consulted to help address goals.  SOCIAL HISTORY:     reports that she quit smoking about 20 years ago. Her smoking use included cigarettes. She has never used smokeless tobacco. She reports that she does not drink alcohol and does not use drugs.  ADVANCE DIRECTIVES:  None on file  CODE STATUS: DNR  PAST MEDICAL HISTORY: Past Medical History:  Diagnosis Date   Arthritis    Baker's cyst of knee, right    a. 08/2016 U/S: Baker's cyst noted in R popliteal fossa.   Complication of anesthesia    COPD (chronic obstructive pulmonary disease) ([place])    a. Home O2 started 08/2017.   Coronary artery disease     a. prior h/o stenting ~ 2005.   Depression    Edema    Expressive aphasia    GI bleed 11/14/2013   History of cardiac monitoring    a. 08/2016 s/p LINQ placement following CVA. No AFib to date (09/2017).   History of pneumonia    Hx of ischemic left MCA stroke    a. 08/2016 large posterior L MCA territory infarct in the setting of LLE DVT and suggestion of PFO on transcranial dopplers w/ bubble study (no PFO reported on echocardiogram)-->Eliquis.   Hypertension    Left leg DVT ([place])    a. 08/2016 U/S: subacute DVT in prox L popliteal vein w/ chronic superficial thrombosis in the L greater saphenous vein from the mid to prox L calf-->Eliquis.   PFO (patent foramen ovale)    a. 08/2016 Abnl TCD w/ bubble study suggesting large PFO. Not reported on echocardiogram.   PONV (postoperative nausea and vomiting)     PAST SURGICAL HISTORY:  Past Surgical History:  Procedure Laterality Date   BALLOON ANGIOPLASTY, ARTERY  05/17/2009   CATARACT EXTRACTION W/PHACO Left 05/24/2016   Procedure: CATARACT EXTRACTION PHACO AND INTRAOCULAR LENS PLACEMENT (IOC);  Surgeon: Birder Robson, MD;  Location: [facility] ORS;  Service: Ophthalmology;  Laterality: Left;  Lot# [id number] [place]:   00:52.3 AP%:   27.3 CDE:  14.26    CATARACT EXTRACTION W/PHACO Right 06/14/2016   Procedure: CATARACT EXTRACTION PHACO AND INTRAOCULAR LENS PLACEMENT ([place]);  Surgeon: Birder Robson, MD;  Location: [facility] ORS;  Service: Ophthalmology;  Laterality: Right;  Lot# [id number] H [place]: 01:13.4 AP%: 24.4 CDE: 17.85   CORONARY  ANGIOPLASTY     STENT   EP IMPLANTABLE DEVICE N/A 08/19/2016   Procedure: Loop Recorder Insertion;  Surgeon: Will Meredith Leeds, MD;  Location: [facility];  Service: Cardiovascular;  Laterality: N/A;    HEMATOLOGY/ONCOLOGY HISTORY:  Oncology History   No history exists.    ALLERGIES:  is allergic to duloxetine hcl, influenza vaccines, and venlafaxine.  MEDICATIONS:  Current Facility-Administered Medications   Medication Dose Route Frequency Provider Last Rate Last Admin   acetaminophen (TYLENOL) tablet 650 mg  650 mg Oral Q6H PRN Clarnce Flock, MD       Or   acetaminophen (TYLENOL) suppository 650 mg  650 mg Rectal Q6H PRN Clarnce Flock, MD       albuterol (PROVENTIL) (2.5 MG/3ML) 0.083% nebulizer solution 2.5 mg  2.5 mg Nebulization Q4H PRN Clarnce Flock, MD       ALPRAZolam Duanne Moron) tablet 0.25 mg  0.25 mg Oral TID PRN Ralene Muskrat B, MD   0.25 mg at 06/13/22 0215   amLODipine (NORVASC) tablet 5 mg  5 mg Oral Daily Clarnce Flock, MD   5 mg at 06/13/22 0850   arformoterol (BROVANA) nebulizer solution 15 mcg  15 mcg Nebulization BID Ralene Muskrat B, MD   15 mcg at 06/13/22 0724   azithromycin (ZITHROMAX) 500 mg in sodium chloride 0.9 % 250 mL IVPB  500 mg Intravenous Q24H Clarnce Flock, MD 250 mL/hr at 06/12/22 1722 500 mg at 06/12/22 1722   budesonide (PULMICORT) nebulizer solution 0.25 mg  0.25 mg Nebulization BID Ralene Muskrat B, MD   0.25 mg at 06/13/22 0717   cefTRIAXone (ROCEPHIN) 2 g in sodium chloride 0.9 % 100 mL IVPB  2 g Intravenous Q24H Clarnce Flock, MD 200 mL/hr at 06/12/22 1645 2 g at 06/12/22 1645   gabapentin (NEURONTIN) capsule 100 mg  100 mg Oral BID Clarnce Flock, MD   100 mg at 06/13/22 0850   guaiFENesin (MUCINEX) 12 hr tablet 600 mg  600 mg Oral BID Ralene Muskrat B, MD   600 mg at 06/13/22 0850   ipratropium-albuterol (DUONEB) 0.5-2.5 (3) MG/3ML nebulizer solution 3 mL  3 mL Nebulization Q6H Sreenath, Sudheer B, MD   3 mL at 06/13/22 1309   loratadine (CLARITIN) tablet 10 mg  10 mg Oral Daily Clarnce Flock, MD   10 mg at 06/13/22 0850   [START ON 06/14/2022] methylPREDNISolone sodium succinate (SOLU-MEDROL) 40 mg/mL injection 40 mg  40 mg Intravenous Q24H Sreenath, Sudheer B, MD       metoprolol tartrate (LOPRESSOR) injection 5 mg  5 mg Intravenous Q6H PRN Clarnce Flock, MD       montelukast (SINGULAIR) tablet 10 mg  10  mg Oral QHS Clarnce Flock, MD   10 mg at 06/12/22 2110   morphine (PF) 2 MG/ML injection 2 mg  2 mg Intravenous Q3H PRN Ralene Muskrat B, MD       ondansetron (ZOFRAN) tablet 4 mg  4 mg Oral Q6H PRN Clarnce Flock, MD       Or   ondansetron [facility]) injection 4 mg  4 mg Intravenous Q6H PRN Clarnce Flock, MD       oxyCODONE (Oxy IR/ROXICODONE) immediate release tablet 5 mg  5 mg Oral Q4H PRN Clarnce Flock, MD       pantoprazole (PROTONIX) EC tablet 40 mg  40 mg Oral Daily Clarnce Flock, MD   40 mg at 06/13/22 [phone]  PARoxetine (PAXIL) tablet 40 mg  40 mg Oral Daily Clarnce Flock, MD   40 mg at 06/13/22 0855   polyethylene glycol (MIRALAX / GLYCOLAX) packet 17 g  17 g Oral Daily PRN Clarnce Flock, MD       sodium chloride flush (NS) 0.9 % injection 3 mL  3 mL Intravenous Q12H Clarnce Flock, MD   3 mL at 06/13/22 0856   traZODone (DESYREL) tablet 25 mg  25 mg Oral QHS PRN Clarnce Flock, MD   25 mg at 06/12/22 2110    VITAL SIGNS: BP (!) 119/56 (BP Location: Right Arm)   Pulse 91   Temp 97.9 F (36.6 C) (Oral)   Resp 18   Ht _0  (1.575 m)   SpO2 96%   BMI 21.40 kg/m  There were no vitals filed for this visit.  Estimated body mass index is 21.4 kg/m as calculated from the following:   Height as of this encounter: _1  (1.575 m).   Weight as of 05/18/22: 117 lb (53.1 kg).  LABS: CBC:    Component Value Date/Time   WBC 7.3 06/11/2022 0529   HGB 9.2 (L) 06/11/2022 0529   HGB 10.1 (L) 08/26/2020 1526   HCT 28.8 (L) 06/11/2022 0529   HCT 31.2 (L) 08/26/2020 1526   PLT 237 06/11/2022 0529   PLT 252 08/26/2020 1526   MCV 95.7 06/11/2022 0529   MCV 96 08/26/2020 1526   NEUTROABS 9.2 (H) 06/10/2022 1313   NEUTROABS 7.6 (H) 08/26/2020 1526   LYMPHSABS 1.0 06/10/2022 1313   LYMPHSABS 1.2 08/26/2020 1526   MONOABS 0.7 06/10/2022 1313   EOSABS 0.0 06/10/2022 1313   EOSABS 0.1 08/26/2020 1526   BASOSABS 0.0 06/10/2022 1313   BASOSABS  0.0 08/26/2020 1526   Comprehensive Metabolic Panel:    Component Value Date/Time   NA 140 06/11/2022 0529   NA 141 08/26/2020 1526   K 4.2 06/11/2022 0529   CL 112 (H) 06/11/2022 0529   CO2 25 06/11/2022 0529   BUN 26 (H) 06/11/2022 0529   BUN 34 (H) 08/26/2020 1526   CREATININE 1.17 (H) 06/11/2022 0529   CREATININE 1.18 (H) 08/02/2017 0826   GLUCOSE 82 06/11/2022 0529   CALCIUM 8.6 (L) 06/11/2022 0529   AST 14 (L) 06/11/2022 0529   ALT 17 06/11/2022 0529   ALKPHOS 67 06/11/2022 0529   BILITOT 0.6 06/11/2022 0529   BILITOT 0.2 05/12/2020 1042   PROT 5.6 (L) 06/11/2022 0529   PROT 6.6 05/12/2020 1042   ALBUMIN 2.7 (L) 06/11/2022 0529   ALBUMIN 4.3 05/12/2020 1042    RADIOGRAPHIC STUDIES: MR BRAIN W WO CONTRAST  Result Date: 06/11/2022 CLINICAL DATA:  Provided history: Metastatic disease evaluation. Lung cancer. Evaluate for metastases. EXAM: MRI HEAD WITHOUT AND WITH CONTRAST TECHNIQUE: Multiplanar, multiecho pulse sequences of the brain and surrounding structures were obtained without and with intravenous contrast. CONTRAST:  57m GADAVIST GADOBUTROL 1 MMOL/ML IV SOLN COMPARISON:  Head CT 09/12/2017. Brain MRI 08/18/2016. FINDINGS: Brain: Mild generalized parenchymal atrophy. 3 mm nodular enhancing lesion within the medial left temporal lobe compatible with a metastasis (series 22, image 53). 3 mm vague focus of enhancement within the high right parietal lobe which may reflect a metastasis or vascular enhancement (series 22, image 128) (series 23, image 9). Punctate focus of enhancement within the right occipital lobe which may reflect a metastasis or vascular enhancement (series 22, image 65) (series 108, image 120). Punctate focus of enhancement within the  left cerebellar hemisphere which may reflect a metastasis or vascular enhancement (series 22, image 45). New from the prior brain MRI of 08/18/2016, there is hypoenhancement and fullness of the pituitary gland within the  mid-to-posterior aspect of the sella turcica, measuring 8 x 8 x 7 mm (for instance as seen on series 108, image 114) (series 103, image 112). Associated rightward deviation of the pituitary stalk (series 103, image 108). Large chronic cortical/subcortical infarct within the left MCA territory and left MCA/PCA watershed territory, within the left frontal lobe, left parietal lobe, left temporal lobe, left occipital lobe and within the left insula. Associated chronic blood products at these sites. Mild T2 FLAIR hyperintense signal abnormality elsewhere within the periventricular cerebral white matter, nonspecific but compatible with mild chronic small vessel disease. Chronic lacunar infarct within the medial right thalamus, new from the prior MRI (series 19, image 26). Prominent perivascular space within the inferior left basal ganglia. Chronic infarcts within both cerebellar hemispheres, one of which in the right cerebellar hemisphere is new from the prior MRI. There is no acute infarct. No extra-axial fluid collection. No midline shift. Vascular: Maintained flow voids within the proximal large arterial vessels. Skull and upper cervical spine: No focal suspicious marrow lesion. Sinuses/Orbits: No mass or acute finding within the imaged orbits. Prior bilateral ocular lens replacement. Minimal mucosal thickening or fluid within the right sphenoid sinus. Other: Trace fluid within the right mastoid air cells. IMPRESSION: 1. 3 mm nodular enhancing lesion within the medial left temporal lobe compatible with a metastasis. 2. Three additional foci of enhancement (measuring up to 3 mm) within the high right parietal lobe, right occipital lobe and left cerebellar hemisphere which may reflect additional metastases or vascular enhancement. 3. New from the prior brain MRI of 08/18/2016, there is hypoenhancement and fullness of the pituitary gland within the mid-to-posterior aspect of the sella turcica, measuring 8 x 8 x 7 mm.  Although nonspecific, this could reflect a pituitary adenoma or a pituitary metastasis. 4. Background parenchymal atrophy, chronic small-vessel skin disease and chronic infarcts, as detailed. Electronically Signed   By: Kellie Simmering D.O.   On: 06/11/2022 19:34   CT ABDOMEN PELVIS W CONTRAST  Result Date: 06/11/2022 CLINICAL DATA:  Newly diagnosed right lung carcinoma. Staging. * Tracking Code: BO * EXAM: CT ABDOMEN AND PELVIS WITH CONTRAST TECHNIQUE: Multidetector CT imaging of the abdomen and pelvis was performed using the standard protocol following bolus administration of intravenous contrast. RADIATION DOSE REDUCTION: This exam was performed according to the departmental dose-optimization program which includes automated exposure control, adjustment of the mA and/or kV according to patient size and/or use of iterative reconstruction technique. CONTRAST:  12m OMNIPAQUE IOHEXOL 300 MG/ML  SOLN COMPARISON:  Chest CTA on 06/10/2022 FINDINGS: Lower Chest: Fluid-filled right lower lobe bronchi are again seen as well as right lower lobe infiltrate, without change since recent chest CTA. This is consistent with postobstructive pneumonitis. Scattered sub-cm pulmonary nodules again seen in the left lower lobe, also unchanged. Hepatobiliary: 3 small low-attenuation masses are seen in the left hepatic lobe measuring up to 2.0 cm, suspicious for hepatic metastases. Contrast noted within the gallbladder, likely from recent chest CTA, however there is no evidence of cholecystitis or biliary ductal dilatation. Pancreas:  No mass or inflammatory changes. Spleen: Within normal limits in size. Tiny benign-appearing splenic cyst noted. Adrenals/Urinary Tract: 1.5 cm fat attenuation left adrenal mass is seen, consistent with benign myelolipoma. Benign-appearing renal cysts are also seen bilaterally (no followup imaging is recommended).  No suspicious renal masses identified. No evidence of ureteral calculi or hydronephrosis.  Stomach/Bowel: No evidence of obstruction, inflammatory process or abnormal fluid collections. Right-sided colonic diverticulosis is noted, without evidence of diverticulitis. Vascular/Lymphatic: No pathologically enlarged lymph nodes. No acute vascular findings. Aortic atherosclerotic calcification incidentally noted. Reproductive:  No mass or other significant abnormality. Other:  None. Musculoskeletal:  No suspicious bone lesions identified. IMPRESSION: Three small low-attenuation masses in left hepatic lobe, suspicious for hepatic metastases. Consider MRI or PET-CT for further evaluation. No other sites of metastatic disease identified within the abdomen or pelvis. Stable findings of postobstructive pneumonitis in the left lower lobe. Aortic Atherosclerosis (ICD10-I70.0). Electronically Signed   By: Marlaine Hind M.D.   On: 06/11/2022 14:32   CT Angio Chest PE W and/or Wo Contrast  Result Date: 06/10/2022 CLINICAL DATA:  Pulmonary embolus suspected; * Tracking Code: BO * EXAM: CT ANGIOGRAPHY CHEST WITH CONTRAST TECHNIQUE: Multidetector CT imaging of the chest was performed using the standard protocol during bolus administration of intravenous contrast. Multiplanar CT image reconstructions and MIPs were obtained to evaluate the vascular anatomy. RADIATION DOSE REDUCTION: This exam was performed according to the departmental dose-optimization program which includes automated exposure control, adjustment of the mA and/or kV according to patient size and/or use of iterative reconstruction technique. CONTRAST:  75m OMNIPAQUE IOHEXOL 350 MG/ML SOLN COMPARISON:  None Available. FINDINGS: Cardiovascular: Normal heart size. No pericardial effusion. Normal caliber thoracic aorta with severe atherosclerotic disease. Mediastinum/Nodes: Esophagus and thyroid are unremarkable. Enlarged right hilar lymph nodes. Reference right hilar lymph node measuring 1.8 cm in short axis on series 4, image 41 Lungs/Pleura: Endobronchial  lesion measuring proximally 1.8 x 1.6 cm involving the right mainstem bronchus causing complete occlusion of the right lower lobe bronchus and severe narrowing of the right upper lobe and right middle lobe bronchi. Right lower lobe bronchi are filled with debris. Numerous tree-in-bud nodules and scattered irregular consolidations are seen throughout the right lower lobe. Additional scattered bilateral solid pulmonary nodules are seen. Reference perihilar nodule of the right lower lobe measuring 1.50.9 cm on series 5, image 130 reference solid nodule of the left lower lobe measuring 4 mm on series 5, image 191 Upper Abdomen: Bilateral low-attenuation renal lesions which are likely simple cysts, no specific follow-up imaging is recommended. No acute abnormalities. Musculoskeletal: No chest wall abnormality. No acute or significant osseous findings. Review of the MIP images confirms the above findings. IMPRESSION: 1. No evidence of pulmonary embolus. 2. Endobronchial lesion involving the right mainstem bronchus causing complete occlusion of the right lower lobe bronchus and severe narrowing of the right upper lobe and right middle lobe bronchi. Findings are concerning for primary lung neoplasm. Recommend further evaluation with tissue sampling. 3. Right lower lobe bronchi are filled with debris. Numerous tree-in-bud nodules and scattered irregular consolidations are seen throughout the right lower lobe, likely postobstructive change. 4. Enlarged right hilar lymph nodes, concerning for metastatic disease. 5. Additional scattered bilateral solid pulmonary nodules are seen which may be to infection/aspiration or metastatic disease. Recommend attention on follow-up. 6. Aortic Atherosclerosis (ICD10-I70.0) and Emphysema (ICD10-J43.9). Electronically Signed   By: LYetta GlassmanM.D.   On: 06/10/2022 17:19   DG Chest Port 1 View  Result Date: 05/24/2022 CLINICAL DATA:  Cough EXAM: PORTABLE CHEST 1 VIEW COMPARISON:   Radiograph 05/18/2022 FINDINGS: Unchanged cardiomediastinal silhouette with loop recorder overlying the left lower chest. Interval improvement in right lower lung airspace opacities. Persistent mild reticulonodular opacities in the right lung base and left  basilar subsegmental atelectasis. No pleural effusion. No evidence of pneumothorax. No acute osseous abnormality. IMPRESSION: Interval improvement in right basilar airspace disease with residual reticulonodular opacities. No new or worsening airspace disease. Electronically Signed   By: Maurine Simmering M.D.   On: 05/24/2022 15:12   DG Chest 2 View  Result Date: 05/18/2022 CLINICAL DATA:  Worsening shortness of breath EXAM: CHEST - 2 VIEW COMPARISON:  Chest radiograph March 14, 2022 FINDINGS: No pleural effusion. No pneumothorax. Loop recorder in place. Calcific plaque in the thoracic and abdominal aorta. Slightly improved right basilar pulmonary opacity, which now predominantly appears interstitial in nature. Unchanged cardiac and mediastinal contours. No displaced rib fractures. Vertebral body heights are maintained. Degenerative changes of the bilateral AC joints. IMPRESSION: Slightly improved right basilar pulmonary opacity, which now predominantly appears interstitial in nature. This is nonspecific, but may represent scarring in the setting of chronic aspiration. Electronically Signed   By: Marin Roberts M.D.   On: 05/18/2022 15:52    PERFORMANCE STATUS (ECOG) : 4 - Bedbound  Review of Systems Unless otherwise noted, a complete review of systems is negative.  Physical Exam General: NAD Pulmonary: Exertionally labored, on O2 Abdomen: soft, nontender, + bowel sounds GU: no suprapubic tenderness Extremities: no edema, no joint deformities Skin: no rashes Neurological: Weakness but otherwise nonfocal  IMPRESSION: I met with patient and two daughters to discuss goals.  All recognize that work-up appears consistent with metastatic disease.  Patient  confirms that she is not interested in pursuing work-up or treatment.  Instead, she is interested in focusing on comfort measures until end-of-life.  Patient and family would like to pursue hospice involvement if possible.  Patient does appear to be a reasonable candidate for hospice IPU given symptom burden.  Patient likely to rapidly decline until end-of-life.  Will discuss with the hospice liaison.  Patient is short of breath, which exacerbates her anxiety.  She has received a dose of alprazolam, which helped some.  She has morphine ordered but has yet to try it.  I explained the role of morphine in helping improve pain/dyspnea.  PLAN: -Best supportive care -Hospice referral -DNR  Case and plan discussed with Dr. Priscella Mann and Dr. Janese Banks.    Time Total: 60 minutes  Visit consisted of counseling and education dealing with the complex and emotionally intense issues of symptom management and palliative care in the setting of serious and potentially life-threatening illness.Greater than 50%  of this time was spent counseling and coordinating care related to the above assessment and plan.  Signed by: Altha Harm, PhD, NP-C

## 2022-06-13 NOTE — Progress Notes (Signed)
PROGRESS NOTE    Stephanie [name]  GEX:[id number] DOB: 19-Nov-1935 DOA: 06/10/2022 PCP: Adin Hector, MD    Brief Narrative:  86 y.o. female with medical history significant of COPD with acute on chronic respiratory failure on 2 L of home O2, history of CVA, history of DVT on long-term anticoagulation, hypertension, CKD stage III, anxiety and depression, who presents with report of coughing blood.   Of note patient has been admitted twice in the past 3 months for COPD exacerbations and pneumonias.  During admission in August she was started on home O2, her most recent admission ended on October 19.   History obtained from patient and her daughter, with her daughter providing majority of the history.  They report that late last night she began to cough more frequently and then this morning noticed there was a significant amount of blood in her phlegm.  They report that it was not a significant amount but were unable to give a good quantification of the amount.  She reports she has not missed any medications recently.  She has been living at [facility] of late since her last admission.  She currently endorses her baseline shortness of breath but nothing additional, no chest pain, no lower extremity edema.   In the ED vital signs with mild hypertension but normal oxygenation on home 2 L of oxygen, remainder vital signs unremarkable.  CBC showed borderline leukocytosis and stable anemia with hemoglobin of 9.8, but was otherwise unremarkable as was her BMP which showed creatinine at baseline (1.1).  Troponin was checked and was initially elevated at 49, on repeat almost 4 hours later was flat at 51, this was consistent with last check approximately 3 weeks ago.  CTPA was obtained which showed endobronchial lesion in the right mainstem bronchus causing complete occlusion of the right lower lobe bronchus concerning for primary lung neoplasm, as well as other findings concerning for  postobstructive changes and metastatic disease.  She was started on vancomycin, cefepime, azithromycin.  11/4: Case discussed with pulmonology and oncology.  Oncology recommending metastatic work-up including abdominal CT and brain MRI.  If no other targets patient might warrant endobronchial ultrasound for tissue diagnosis.  Possible that if we detect presence of metastatic disease that there could be a more amenable biopsy target.  11/5: MRI brain and CT abd with lesions suspicious for metastatic deposits.  Lengthy discussion with 2 daughters at bedside.  Dr. Janese Banks will speak with them regarding biopsy and what therapeutics may look like  11/6: After discussion with the family, decision made to defer biopsy as patient does not wish to undergo any treatment at this time.  Elects to proceed with hospice referral.  Palliative care NP [name] and hospice liaison engaged.   Assessment & Plan:   Principal Problem:   Hemoptysis Active Problems:   Acute on chronic respiratory failure with hypoxia (HCC)   History of stroke   Essential hypertension   DVT (deep venous thrombosis) (HCC)   Anxiety and depression   Elevated troponin   Cerebrovascular accident (CVA) due to embolism of left middle cerebral artery (HCC)   CKD (chronic kidney disease) stage 3, GFR 30-59 ml/min (HCC)   Mass of right lung   Known medical problems  * Hemoptysis Appears to be minimal, hemoglobin unchanged from prior.   Currently no indication for transfusion.   Hold Eliquis   Acute exacerbation of COPD Stable on home O2 of 2 L. Significantly coarse breath sounds with diminished air  movement Improving over interval Plan: DuoNeb every 6 hours Twice daily Brovana Twice daily Pulmicort Hold home Trelegy IV Solu-Medrol 40 mg daily, continue to taper  Mass of right lung Patient presenting with hemoptysis and right bronchial mass highly suspicious for primary malignancy.  Not having any acute bleeding at present  but high risk for significant bleeding given etiology as well as being on long-term anticoagulation.  Will need tissue diagnosis.  Possible bronchoscopy however if there is acceptable biopsy target on abdominal imaging may be able to pursue percutaneous approach - CT abd with liver lesions - MRI brain with possible metastatic deposits Plan: After discussion with oncology family made decision not to proceed with biopsy.  Palliative care engaged, hospice liaison engaged.  Patient may benefit from discharge to long-term care facility with hospice backup.  Elevated troponin No indication of ACS.  Suspect supply demand ischemia  Known medical problems Hypertension-continue amlodipine Neuropathy-continue gabapentin Allergies-continue Claritin, Singulair GERD-continue PPI Anxiety/depression-continue Paxil    DVT prophylaxis: SCD Code Status: DNR Family Communication: Daughter at bedside 11/4, 11/5, 11/6 Disposition Plan: Status is: Inpatient Remains inpatient appropriate because: COPD exacerbation on IV steroids and bronchodilators.  Lung mass with possible postobstructive pneumonia.  Hospice referral in progress.   Level of care: Med-Surg  Consultants:  Pulmonary  Procedures:  None  Antimicrobials: Rocephin Azithromycin   Subjective: Seen and examined.  Sitting up in chair.  Appears frail.  Daughter at bedside  Objective: Vitals:   06/12/22 2018 06/13/22 0200 06/13/22 0418 06/13/22 0813  BP: 92/73  138/68 (!) 119/56  Pulse: 86  90 91  Resp: 18  18 18   Temp: 98 F (36.7 C)  98.1 F (36.7 C) 97.9 F (36.6 C)  TempSrc: Oral   Oral  SpO2: 98% 99% 99% 96%  Height:        Intake/Output Summary (Last 24 hours) at 06/13/2022 1121 Last data filed at 06/13/2022 1036 Gross per 24 hour  Intake 483 ml  Output --  Net 483 ml   There were no vitals filed for this visit.  Examination:  General exam: No acute distress.  Appears frail Respiratory system: Coarse breath sounds,  improved air movement, 2L Cardiovascular system: S1-S2, RRR, no murmurs, no pedal edema Gastrointestinal system: Thin, soft, NT/ND, normal bowel sounds Central nervous system: Alert and oriented. No focal neurological deficits. Extremities: Symmetric 2 out of 5 power Skin: No rashes, lesions or ulcers Psychiatry: Judgement and insight appear normal. Mood & affect appropriate.     Data Reviewed: I have personally reviewed following labs and imaging studies  CBC: Recent Labs  Lab 06/10/22 1313 06/11/22 0529  WBC 11.0* 7.3  NEUTROABS 9.2*  --   HGB 9.8* 9.2*  HCT 30.9* 28.8*  MCV 95.7 95.7  PLT 252 761   Basic Metabolic Panel: Recent Labs  Lab 06/10/22 1313 06/11/22 0529  NA 139 140  K 4.3 4.2  CL 108 112*  CO2 25 25  GLUCOSE 94 82  BUN 36* 26*  CREATININE 1.18* 1.17*  CALCIUM 9.2 8.6*   GFR: CrCl cannot be calculated (Unknown ideal weight.). Liver Function Tests: Recent Labs  Lab 06/11/22 0529  AST 14*  ALT 17  ALKPHOS 67  BILITOT 0.6  PROT 5.6*  ALBUMIN 2.7*   No results for input(s): "LIPASE", "AMYLASE" in the last 168 hours. No results for input(s): "AMMONIA" in the last 168 hours. Coagulation Profile: Recent Labs  Lab 06/10/22 1436  INR 1.2   Cardiac Enzymes: No results for input(s): "  CKTOTAL", "CKMB", "CKMBINDEX", "TROPONINI" in the last 168 hours. BNP (last 3 results) No results for input(s): "PROBNP" in the last 8760 hours. HbA1C: No results for input(s): "HGBA1C" in the last 72 hours. CBG: No results for input(s): "GLUCAP" in the last 168 hours. Lipid Profile: No results for input(s): "CHOL", "HDL", "LDLCALC", "TRIG", "CHOLHDL", "LDLDIRECT" in the last 72 hours. Thyroid Function Tests: No results for input(s): "TSH", "T4TOTAL", "FREET4", "T3FREE", "THYROIDAB" in the last 72 hours. Anemia Panel: No results for input(s): "VITAMINB12", "FOLATE", "FERRITIN", "TIBC", "IRON", "RETICCTPCT" in the last 72 hours. Sepsis Labs: No results for  input(s): "PROCALCITON", "LATICACIDVEN" in the last 168 hours.  Recent Results (from the past 240 hour(s))  Expectorated Sputum Assessment w Gram Stain, Rflx to Resp Cult     Status: None   Collection Time: 06/10/22  9:11 AM   Specimen: Sputum  Result Value Ref Range Status   Specimen Description SPUTUM  Final   Special Requests NONE  Final   Sputum evaluation   Final    Sputum specimen not acceptable for testing.  Please recollect.   C/ERICA JOHNSON AT 1039 06/12/22.PMF Performed at [facility], [address] [place], [place] [address]    Report Status 06/12/2022 FINAL  Final  Resp Panel by RT-PCR (Flu A&B, Covid) Anterior Nasal Swab     Status: None   Collection Time: 06/10/22  2:36 PM   Specimen: Anterior Nasal Swab  Result Value Ref Range Status   SARS Coronavirus 2 by RT PCR NEGATIVE NEGATIVE Final    Comment: (NOTE) SARS-CoV-2 target nucleic acids are NOT DETECTED.  The SARS-CoV-2 RNA is generally detectable in upper respiratory specimens during the acute phase of infection. The lowest concentration of SARS-CoV-2 viral copies this assay can detect is 138 copies/mL. A negative result does not preclude SARS-Cov-2 infection and should not be used as the sole basis for treatment or other patient management decisions. A negative result may occur with  improper specimen collection/handling, submission of specimen other than nasopharyngeal swab, presence of viral mutation(s) within the areas targeted by this assay, and inadequate number of viral copies(<138 copies/mL). A negative result must be combined with clinical observations, patient history, and epidemiological information. The expected result is Negative.  Fact Sheet for Patients:  EntrepreneurPulse.com.au  Fact Sheet for Healthcare Providers:  IncredibleEmployment.be  This test is no t yet approved or cleared by the [place] FDA and  has been authorized for  detection and/or diagnosis of SARS-CoV-2 by FDA under an Emergency Use Authorization (EUA). This EUA will remain  in effect (meaning this test can be used) for the duration of the COVID-19 declaration under Section 564(b)(1) of the Act, 21 U.S.C.section 360bbb-3(b)(1), unless the authorization is terminated  or revoked sooner.       Influenza A by PCR NEGATIVE NEGATIVE Final   Influenza B by PCR NEGATIVE NEGATIVE Final    Comment: (NOTE) The Xpert Xpress SARS-CoV-2/FLU/RSV plus assay is intended as an aid in the diagnosis of influenza from Nasopharyngeal swab specimens and should not be used as a sole basis for treatment. Nasal washings and aspirates are unacceptable for Xpert Xpress SARS-CoV-2/FLU/RSV testing.  Fact Sheet for Patients: EntrepreneurPulse.com.au  Fact Sheet for Healthcare Providers: IncredibleEmployment.be  This test is not yet approved or cleared by the [place] FDA and has been authorized for detection and/or diagnosis of SARS-CoV-2 by FDA under an Emergency Use Authorization (EUA). This EUA will remain in effect (meaning this test can be used) for the duration  of the COVID-19 declaration under Section 564(b)(1) of the Act, 21 U.S.C. section 360bbb-3(b)(1), unless the authorization is terminated or revoked.  Performed at [facility], [address] [place], [place] [address]   Culture, blood (single)     Status: None (Preliminary result)   Collection Time: 06/10/22  7:01 PM   Specimen: BLOOD  Result Value Ref Range Status   Specimen Description BLOOD BLOOD RIGHT ARM  Final   Special Requests   Final    BOTTLES DRAWN AEROBIC AND ANAEROBIC Blood Culture results may not be optimal due to an inadequate volume of blood received in culture bottles   Culture   Final    NO GROWTH 2 DAYS Performed at [facility], [address] [place], [place] [address]    Report Status PENDING  Incomplete  MRSA  Next Gen by PCR, Nasal     Status: None   Collection Time: 06/10/22  9:35 PM   Specimen: Nasal Mucosa; Nasal Swab  Result Value Ref Range Status   MRSA by PCR Next Gen NOT DETECTED NOT DETECTED Final    Comment: (NOTE) The GeneXpert MRSA Assay (FDA approved for NASAL specimens only), is one component of a comprehensive MRSA colonization surveillance program. It is not intended to diagnose MRSA infection nor to guide or monitor treatment for MRSA infections. Test performance is not FDA approved in patients less than 66 years old. Performed at [facility], [address] [place], [place] [address]          Radiology Studies: MR BRAIN W WO CONTRAST  Result Date: 06/11/2022 CLINICAL DATA:  Provided history: Metastatic disease evaluation. Lung cancer. Evaluate for metastases. EXAM: MRI HEAD WITHOUT AND WITH CONTRAST TECHNIQUE: Multiplanar, multiecho pulse sequences of the brain and surrounding structures were obtained without and with intravenous contrast. CONTRAST:  16mL GADAVIST GADOBUTROL 1 MMOL/ML IV SOLN COMPARISON:  Head CT 09/12/2017. Brain MRI 08/18/2016. FINDINGS: Brain: Mild generalized parenchymal atrophy. 3 mm nodular enhancing lesion within the medial left temporal lobe compatible with a metastasis (series 22, image 53). 3 mm vague focus of enhancement within the high right parietal lobe which may reflect a metastasis or vascular enhancement (series 22, image 128) (series 23, image 9). Punctate focus of enhancement within the right occipital lobe which may reflect a metastasis or vascular enhancement (series 22, image 65) (series 108, image 120). Punctate focus of enhancement within the left cerebellar hemisphere which may reflect a metastasis or vascular enhancement (series 22, image 45). New from the prior brain MRI of 08/18/2016, there is hypoenhancement and fullness of the pituitary gland within the mid-to-posterior aspect of the sella turcica, measuring 8 x 8 x 7 mm  (for instance as seen on series 108, image 114) (series 103, image 112). Associated rightward deviation of the pituitary stalk (series 103, image 108). Large chronic cortical/subcortical infarct within the left MCA territory and left MCA/PCA watershed territory, within the left frontal lobe, left parietal lobe, left temporal lobe, left occipital lobe and within the left insula. Associated chronic blood products at these sites. Mild T2 FLAIR hyperintense signal abnormality elsewhere within the periventricular cerebral white matter, nonspecific but compatible with mild chronic small vessel disease. Chronic lacunar infarct within the medial right thalamus, new from the prior MRI (series 19, image 26). Prominent perivascular space within the inferior left basal ganglia. Chronic infarcts within both cerebellar hemispheres, one of which in the right cerebellar hemisphere is new from the prior MRI. There is no acute infarct. No  extra-axial fluid collection. No midline shift. Vascular: Maintained flow voids within the proximal large arterial vessels. Skull and upper cervical spine: No focal suspicious marrow lesion. Sinuses/Orbits: No mass or acute finding within the imaged orbits. Prior bilateral ocular lens replacement. Minimal mucosal thickening or fluid within the right sphenoid sinus. Other: Trace fluid within the right mastoid air cells. IMPRESSION: 1. 3 mm nodular enhancing lesion within the medial left temporal lobe compatible with a metastasis. 2. Three additional foci of enhancement (measuring up to 3 mm) within the high right parietal lobe, right occipital lobe and left cerebellar hemisphere which may reflect additional metastases or vascular enhancement. 3. New from the prior brain MRI of 08/18/2016, there is hypoenhancement and fullness of the pituitary gland within the mid-to-posterior aspect of the sella turcica, measuring 8 x 8 x 7 mm. Although nonspecific, this could reflect a pituitary adenoma or a  pituitary metastasis. 4. Background parenchymal atrophy, chronic small-vessel skin disease and chronic infarcts, as detailed. Electronically Signed   By: Kellie Simmering D.O.   On: 06/11/2022 19:34   CT ABDOMEN PELVIS W CONTRAST  Result Date: 06/11/2022 CLINICAL DATA:  Newly diagnosed right lung carcinoma. Staging. * Tracking Code: BO * EXAM: CT ABDOMEN AND PELVIS WITH CONTRAST TECHNIQUE: Multidetector CT imaging of the abdomen and pelvis was performed using the standard protocol following bolus administration of intravenous contrast. RADIATION DOSE REDUCTION: This exam was performed according to the departmental dose-optimization program which includes automated exposure control, adjustment of the mA and/or kV according to patient size and/or use of iterative reconstruction technique. CONTRAST:  66mL OMNIPAQUE IOHEXOL 300 MG/ML  SOLN COMPARISON:  Chest CTA on 06/10/2022 FINDINGS: Lower Chest: Fluid-filled right lower lobe bronchi are again seen as well as right lower lobe infiltrate, without change since recent chest CTA. This is consistent with postobstructive pneumonitis. Scattered sub-cm pulmonary nodules again seen in the left lower lobe, also unchanged. Hepatobiliary: 3 small low-attenuation masses are seen in the left hepatic lobe measuring up to 2.0 cm, suspicious for hepatic metastases. Contrast noted within the gallbladder, likely from recent chest CTA, however there is no evidence of cholecystitis or biliary ductal dilatation. Pancreas:  No mass or inflammatory changes. Spleen: Within normal limits in size. Tiny benign-appearing splenic cyst noted. Adrenals/Urinary Tract: 1.5 cm fat attenuation left adrenal mass is seen, consistent with benign myelolipoma. Benign-appearing renal cysts are also seen bilaterally (no followup imaging is recommended). No suspicious renal masses identified. No evidence of ureteral calculi or hydronephrosis. Stomach/Bowel: No evidence of obstruction, inflammatory process or  abnormal fluid collections. Right-sided colonic diverticulosis is noted, without evidence of diverticulitis. Vascular/Lymphatic: No pathologically enlarged lymph nodes. No acute vascular findings. Aortic atherosclerotic calcification incidentally noted. Reproductive:  No mass or other significant abnormality. Other:  None. Musculoskeletal:  No suspicious bone lesions identified. IMPRESSION: Three small low-attenuation masses in left hepatic lobe, suspicious for hepatic metastases. Consider MRI or PET-CT for further evaluation. No other sites of metastatic disease identified within the abdomen or pelvis. Stable findings of postobstructive pneumonitis in the left lower lobe. Aortic Atherosclerosis (ICD10-I70.0). Electronically Signed   By: Marlaine Hind M.D.   On: 06/11/2022 14:32        Scheduled Meds:  amLODipine  5 mg Oral Daily   arformoterol  15 mcg Nebulization BID   budesonide (PULMICORT) nebulizer solution  0.25 mg Nebulization BID   gabapentin  100 mg Oral BID   guaiFENesin  600 mg Oral BID   ipratropium-albuterol  3 mL Nebulization Q6H  loratadine  10 mg Oral Daily   [START ON 06/14/2022] methylPREDNISolone (SOLU-MEDROL) injection  40 mg Intravenous Q24H   montelukast  10 mg Oral QHS   pantoprazole  40 mg Oral Daily   PARoxetine  40 mg Oral Daily   sodium chloride flush  3 mL Intravenous Q12H   Continuous Infusions:  azithromycin 500 mg (06/12/22 1722)   cefTRIAXone (ROCEPHIN)  IV 2 g (06/12/22 1645)     LOS: 3 days      Sidney Ace, MD [facility]   If 7PM-7AM, please contact night-coverage  06/13/2022, 11:21 AM

## 2022-06-13 NOTE — TOC Transition Note (Signed)
Transition of Care [facility]) - CM/SW Discharge Note   Patient Details  Name: Stephanie [name] MRN: [id number] Date of Birth: 06/14/1936  [facility]) CM/SW Contact:  Beverly Sessions, RN Phone Number: 06/13/2022, 3:23 PM   Clinical Narrative:    Per Raquel Sarna at [employer]. Patient has been accepted to hospice home and will admit today  Manufacturing engineer to coordinate with family and arrange transport  Bedside RN to check if signed DNR on Chart EMS packet printed to [facility].  Bedside RN notified      Barriers to Discharge: Continued Medical Work up   Patient Goals and CMS Choice   CMS Medicare.gov Compare Post Acute Care list provided to:: Patient Represenative (must comment) (Pt's adut daughters) Choice offered to / list presented to : Adult Children  Discharge Placement                       Discharge Plan and Services                                     Social Determinants of Health (SDOH) Interventions     Readmission Risk Interventions    06/12/2022   12:00 PM  Readmission Risk Prevention Plan  Transportation Screening Complete  PCP or Specialist Appt within 3-5 Days Complete  HRI or [place] Complete  Social Work Consult for [place] Planning/Counseling Complete  Palliative Care Screening Not Applicable  Medication Review Press photographer) Complete

## 2022-06-13 NOTE — TOC Progression Note (Signed)
Transition of Care [facility]) - Progression Note    Patient Details  Name: Stephanie [name] MRN: [id number] Date of Birth: 1936-05-03  Transition of Care [facility]) CM/SW Contact  Beverly Sessions, RN Phone Number: 06/13/2022, 2:05 PM  Clinical Narrative:     Merrily Pew with Palliative has met with patient and family and recommendation of Inpatient hospital  Met with patient and daughters Butch Penny And Jacqlyn Larsen, and Lanice Shirts at bedside    They are in agreement for referral to be sent to [place] and [place]   Referral made to [place] with Manufacturing engineer. Referral made to [facility] at [facility], she will give the information to Kika    Barriers to Discharge: Continued Medical Work up  Expected Discharge Plan and Services         Living arrangements for the past 2 months: Single Family Home                                       Social Determinants of Health (SDOH) Interventions    Readmission Risk Interventions    06/12/2022   12:00 PM  Readmission Risk Prevention Plan  Transportation Screening Complete  PCP or Specialist Appt within 3-5 Days Complete  HRI or [place] Complete  Social Work Consult for [place] Planning/Counseling Complete  Palliative Care Screening Not Applicable  Medication Review Press photographer) Complete

## 2022-06-13 NOTE — Care Management Important Message (Signed)
Important Message  Patient Details  Name: Stephanie [name] MRN: [id number] Date of Birth: [birth year]   Medicare Important Message Given:  Other (see comment)  Disposition to discharge to hospice home.  Medicare IM withheld at this time out of respect for patient and family.    Dannette Barbara 06/13/2022, 4:22 PM

## 2022-06-15 LAB — CULTURE, BLOOD (SINGLE): Culture: NO GROWTH

## 2022-07-08 DEATH — deceased
